# Patient Record
Sex: Female | Born: 1950 | Race: White | Hispanic: No | Smoking: Former smoker
Health system: Southern US, Community
[De-identification: ages and names within clinical notes are randomized; demographics above are authoritative.]

## PROBLEM LIST (undated history)

## (undated) DIAGNOSIS — Z860100 Personal history of colon polyps, unspecified: Secondary | ICD-10-CM

## (undated) DIAGNOSIS — J189 Pneumonia, unspecified organism: Secondary | ICD-10-CM

## (undated) DIAGNOSIS — I5189 Other ill-defined heart diseases: Secondary | ICD-10-CM

## (undated) DIAGNOSIS — E119 Type 2 diabetes mellitus without complications: Secondary | ICD-10-CM

## (undated) DIAGNOSIS — Z8619 Personal history of other infectious and parasitic diseases: Secondary | ICD-10-CM

## (undated) DIAGNOSIS — G4730 Sleep apnea, unspecified: Secondary | ICD-10-CM

## (undated) DIAGNOSIS — I480 Paroxysmal atrial fibrillation: Secondary | ICD-10-CM

## (undated) DIAGNOSIS — K760 Fatty (change of) liver, not elsewhere classified: Secondary | ICD-10-CM

## (undated) DIAGNOSIS — Z8719 Personal history of other diseases of the digestive system: Secondary | ICD-10-CM

## (undated) DIAGNOSIS — M1990 Unspecified osteoarthritis, unspecified site: Secondary | ICD-10-CM

## (undated) DIAGNOSIS — K5790 Diverticulosis of intestine, part unspecified, without perforation or abscess without bleeding: Secondary | ICD-10-CM

## (undated) DIAGNOSIS — K589 Irritable bowel syndrome without diarrhea: Secondary | ICD-10-CM

## (undated) DIAGNOSIS — J45909 Unspecified asthma, uncomplicated: Secondary | ICD-10-CM

## (undated) DIAGNOSIS — E782 Mixed hyperlipidemia: Secondary | ICD-10-CM

## (undated) DIAGNOSIS — C801 Malignant (primary) neoplasm, unspecified: Secondary | ICD-10-CM

## (undated) DIAGNOSIS — I2510 Atherosclerotic heart disease of native coronary artery without angina pectoris: Secondary | ICD-10-CM

## (undated) DIAGNOSIS — E039 Hypothyroidism, unspecified: Secondary | ICD-10-CM

## (undated) DIAGNOSIS — K219 Gastro-esophageal reflux disease without esophagitis: Secondary | ICD-10-CM

## (undated) DIAGNOSIS — F32A Depression, unspecified: Secondary | ICD-10-CM

## (undated) DIAGNOSIS — J449 Chronic obstructive pulmonary disease, unspecified: Secondary | ICD-10-CM

## (undated) DIAGNOSIS — M5126 Other intervertebral disc displacement, lumbar region: Secondary | ICD-10-CM

## (undated) DIAGNOSIS — I10 Essential (primary) hypertension: Secondary | ICD-10-CM

## (undated) DIAGNOSIS — I447 Left bundle-branch block, unspecified: Secondary | ICD-10-CM

## (undated) HISTORY — DX: Gastro-esophageal reflux disease without esophagitis: K21.9

## (undated) HISTORY — DX: Fatty (change of) liver, not elsewhere classified: K76.0

## (undated) HISTORY — DX: Diverticulosis of intestine, part unspecified, without perforation or abscess without bleeding: K57.90

## (undated) HISTORY — DX: Other intervertebral disc displacement, lumbar region: M51.26

## (undated) HISTORY — DX: Unspecified osteoarthritis, unspecified site: M19.90

## (undated) HISTORY — DX: Mixed hyperlipidemia: E78.2

## (undated) HISTORY — DX: Personal history of colon polyps, unspecified: Z86.0100

## (undated) HISTORY — DX: Unspecified asthma, uncomplicated: J45.909

## (undated) HISTORY — DX: Personal history of other diseases of the digestive system: Z87.19

## (undated) HISTORY — DX: Essential (primary) hypertension: I10

## (undated) HISTORY — DX: Personal history of other infectious and parasitic diseases: Z86.19

## (undated) HISTORY — DX: Personal history of colonic polyps: Z86.010

## (undated) HISTORY — DX: Irritable bowel syndrome, unspecified: K58.9

## (undated) HISTORY — DX: Other ill-defined heart diseases: I51.89

## (undated) HISTORY — PX: CARDIAC CATHETERIZATION: SHX172

## (undated) HISTORY — DX: Hypothyroidism, unspecified: E03.9

## (undated) HISTORY — PX: CARPAL TUNNEL RELEASE: SHX101

## (undated) HISTORY — DX: Chronic obstructive pulmonary disease, unspecified: J44.9

## (undated) NOTE — *Deleted (*Deleted)
10/20/2019 I saw and evaluated the patient. Discussed with resident and agree with resident's findings and plan as documented in the resident's note.  I have seen and evaluated the patient for respiratory failure.  S:  38 year old woman with extensive smoking history presenting for definitive management of her esophageal cancer.  She is s/p neoadjuvant chemo completed July of this year.  Underwent robot-assisted Mckeown esophagectomy and pyloroplasty on 10-30-19.  No evidence of anastamotic leak postop.  Having issues with postop volume overload and possibly aspiration pneumonitis that required transfer to ICU today for respiratory distress.   She currently feels better from respiratory standpoint with BIPAP, denies pain.   O: Blood pressure 114/62, pulse (!) 126, temperature 98.8 F (37.1 C), temperature source Axillary, resp. rate (!) 21, height 5\' 2"  (1.575 m), weight 76.9 kg, SpO2 100 %.  Ill appearing woman mildly tachypneic on BIPAP Lungs with crackles on L Ext with muscle wasting Minute ventilation on NIPPV is around 15LPM Incision site on R neck dressed without strikethrough  A:  Acute hypoxemic respiratory failure post op, some combination of fluid overload and possibly aspiration pneumonia.  Looks okay for now on BIPAP but high risk for decompensation  Esophageal cancer post esophagectomy  Muscular deconditioning  P:  - Close observation on BIPAP, intubate if increased WOB or AMS - Trial of lasix gtt, zosyn - Will follow closely with you  The patient is critically ill with multiple organ systems failure and requires high complexity decision making for assessment and support, frequent evaluation and titration of therapies, application of advanced monitoring technologies and extensive interpretation of multiple databases. Critical Care Time devoted to patient care services described in this note independent of APP/resident  time is *** minutes.   10/20/2019 Myrla Halsted MD

## (undated) NOTE — *Deleted (*Deleted)
Physician Discharge Summary  Patient ID: [name] MRN: [id number] DOB/AGE: December 18, 1950 32 y.o.  Admit date: 11/02/2019 Discharge date: 10/21/2019  Admission Diagnoses:  Esophageal cancer Dyslipidemia  History of irritable bowel syndrome History of colon polyps Gastroesophageal reflux disease History of diverticulosis Chronic obstructive pulmonary disease History of asthma Type 2 diabetes mellitus History of atrial fibrillation History of acute kidney injury   Discharge Diagnoses:   Esophageal adenocarcinoma (HCC) S/P esophagectomy Dyslipidemia  History of irritable bowel syndrome History of colon polyps Gastroesophageal reflux disease History of diverticulosis Chronic obstructive pulmonary disease History of asthma Type 2 diabetes mellitus History of atrial fibrillation History of acute kidney injury Pressure injury of skin Acute hypoxemic respiratory failure (HCC)   Discharged Condition: {condition:18240}   History of Present Illness: [name] y.o.female59 year old female that presents for surgical evaluation of a 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 She subsequently underwent neoadjuvant chemoradiation which per her report was completed in July of this year.Over the course of her neoadjuvant therapy she lost approximately 24 pounds. She currently has a PEG tube in place and has needed to resume tube feeds due to odynophagia and dysphagia. She describes a burning sensation with all meals. She also has regurgitation of food as well.  Hospital Course:  [name] was admitted for elective same-day surgery on 10/18/2019.  She was prepared and taken to the operating room where bronchoscopy was performed followed by robotic assisted laparoscopy, robotic assisted thoracoscopy, McKeown esophagectomy, pyloroplasty, mini laparotomy for placement of a jejunostomy feeding tube, and intercostal nerve  block were performed.  Please see the operative note for details.  Following the procedure, the patient was recovered in the postanesthesia care unit and later transferred to [facility]  progressive care.  She was initially kept n.p.o. with an NG tube in place as well as a right chest tube and left neck Penrose drain.  On the first postoperative day, a simple contrast study was performed through the jejunostomy tube under fluoroscopy to assure patency and free flow of contrast distally into the jejunum.  The study showed appropriate function of the feeding tube so on the following day, tube feeding was initiated at a low rate and gradually increase to goal of 55 mL/h.  She tolerated this well.  She remained n.p.o. with the NG tube in for the next few days.  On postop day 3, the NG was removed.  Tube feeding was continued.  The patient was mobilized.  She had some respiratory distress early postoperatively that was felt to be related to oversedation from a PCA in conjunction with moderate volume excess.  The PCA was discontinued and she was diuresed with good response.  This resulted in significant improvement in both her mental status and respiratory status.  On postop day 4, an esophagram was performed that showed acceptable swallowing function with no evidence of anastomotic leak.  Patient was started on clear liquids by mouth which she tolerated well.  The chest tube was left in place for another 24 hours.  There was minimal output.  After consultation with the dietitian, we began gradual transition of the tube feeding from continuous 24-hour infusion to nocturnal feeding.  She had mild sinus tachycardia which was initially treated with the addition of metoprolol suspension by way of the G-tube.  On postop day 6, she again developed significant respiratory distress and worsening tachycardia.  Chest x-ray showed a new area of airspace disease  in the left upper lobe that was very suspicious for aspiration pneumonia.  She  was started on broad-spectrum antibiotic coverage.  Will place patient on 9% nonrebreather and she had improvement in her oxygen saturation but she continued to be tachypneic with a respiratory rate in the 40s and tachycardiac with a heart rate in the 130s.  We placed the patient on BiPAP and diurese her more aggressively with Lasix drip.  She was transferred back to the intensive care unit.  These maneuvers improve her respiratory status substantially and she appeared much more comfortable.  By the following morning, however, chest x-ray was showing worsening left upper lobe infiltrate.  The patient continued to desaturate very easily when off of BiPAP.  She was becoming more anxious.  A decision was made to proceed to intubation and mechanical ventilation. Additionally, arterial line was placed for monitoring purposes as well as a PICC line to optimize vascular access. Supportive care continued.   Consults: pulmonary/intensive care  Significant Diagnostic Studies:   Diagnostic Studies & Laboratory data:  CT Scan:CT scan from July 2021. There is marked thickening of the mid to distal esophagus involving 9 cm. There is interval development of groundglass opacities in the right upper lobe and superior segments of the right lower lobe. She has a stable 3 mm left upper lobe nodule  PET/CT:Scan from May 18, 2019 was reviewed. There is increased metabolic activity in the middle third of the esophagus with an SUV of 15.3. The esophageal thickening begins at the level of the carina extends inferiorly over 5 cm. Hypermetabolism terminates at the GE junction. There is no hypermetabolic paraesophageal lymph nodes or mediastinal nodes. There is a surgical anastomosis and the left abdomen which likely corresponds with her previous bowel resection in 2014.  EGD/EUS:Performed on 05/09/2019. There is an elongated mid esophageal mass from 28 to 33 cm.  Path:Squamous cell cancer of the esophagus.    Treatments:   Operative Note   10/10/2019  Patient:  [name] Pre-Op Dx: Stage II mid esophageal squamous cancer Post-op Dx: Same Procedure: - Bronchoscopy  - Robotic assisted laparoscopy - Robotic assisted thoracoscopy - Mckeown esophagectomy - Pyloroplasty - Open jejunostomy tube placement 11F - Intercostal nerve block -   Surgeon and Role:      * Lightfoot, Eliezer Lofts, MD - Primary    * Dr. 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 She subsequently underwent neoadjuvant chemoradiation which per her report was completed in July of this year.Over the course of her neoadjuvant therapy she lost approximately 24 pounds. She currently has a PEG tube in place and has needed to resume tube feeds due to odynophagia and dysphagia. She describes a burning sensation with all meals. She also has regurgitation of food as well. Findings: On bronchoscopy, the carina, left and right mainstem bronchus appeared normal.  There was no obvious tumor invasion.  Were able to mobilize the esophagus.  It was very adherent to the posterior surface of the trachea around level 7.  Great care was taken and we were able to fully mobilize the esophagus up to the thoracic inlet.    During laparoscopy there were some adhesions that were encountered  upon entry.  Another 5 mm port was placed in the left upper quadrant we were then able to lyse all the abdominal adhesions so that we could place a robotic ports.  We created the gastric conduit without much difficulty.    When we went back down to place the jejunostomy tube the small bowel was matted.  We created a small supraumbilical  incision to access the small bowel safely.  We were not able to pass the jejunostomy tube very far distally due to adhesions down of the pelvis, thus trimmed the jejunostomy tube so that it would fit appropriately.  Discharge Exam: Blood pressure (!) 108/46, pulse (!) 115, temperature 98.6 F (37 C), temperature source Oral, resp. rate (!) 28, height 5\' 2"  (1.575 m), weight 73.1 kg, SpO2 100 %. {physical WUJW:1191478}  Disposition:    Allergies as of 10/21/2019      Reactions   Honey Bee Venom Protein [bee Venom] Anaphylaxis   Ether Nausea And Vomiting   Nickel Other (See Comments)   infection   Other Other (See Comments)   Pt reports that she cannot have staples placed due to infection.   Tape Rash   Patient states that she has an allergic reaction to certain tapes. She states that she can tolerate paper tape.    Med Rec must be completed prior to using this [facility]***        Durable Medical Equipment  (From admission, onward)         Start     Ordered   10/19/19 1602  For home use only DME Bedside commode  Once       Question:  Patient needs a bedside commode to treat with the following condition  Answer:  Weakness   10/19/19 1601   10/19/19 0916  For home use only DME Tube feeding pump  Once       Question:  Length of Need  Answer:  6 Months   10/19/19 0915           Signed: Leary Roca, PA-C 10/21/2019, 2:01 PM

## (undated) NOTE — *Deleted (*Deleted)
Nutrition Follow-up  DOCUMENTATION CODES:   Severe malnutrition in context of chronic illness  INTERVENTION:   Tube Feeding:    NUTRITION DIAGNOSIS:   Severe Malnutrition related to chronic illness (COPD, esophageal cancer s/p chemoradiation) as evidenced by severe fat depletion, severe muscle depletion.  ***  GOAL:   Patient will meet greater than or equal to 90% of their needs  ***  MONITOR:   Vent status, Labs, Weight trends, TF tolerance, Skin, I & O's, Other (Comment) (TPN)  REASON FOR ASSESSMENT:   Consult Enteral/tube feeding initiation and management  ASSESSMENT:   Patient with PMH significant for COPD, diverticulosis, GERD, SBO, HTN, IBS, mixed HLD, and esophageal squamous cell cancer s/p neoadjuvant chemoradiation therapy s/p PEG. Presents this admission for surgical management of esophageal cancer.  ***   NUTRITION - FOCUSED PHYSICAL EXAM:  {RD Focused Exam List:21252}  Diet Order:   Diet Order    None      EDUCATION NEEDS:   Education needs have been addressed  Skin:  Skin Assessment: Skin Integrity Issues: Skin Integrity Issues:: Incisions, Stage II Stage II: bilateral buttocks Incisions: chest x 2, abdomen x 2, neck  Last BM:  11/1 rectal tube  Height:   Ht Readings from Last 1 Encounters:  10/20/19 5\' 2"  (1.575 m)    Weight:   Wt Readings from Last 1 Encounters:  11/08/19 66.3 kg    Ideal Body Weight:     BMI:  Body mass index is 26.73 kg/m.  Estimated Nutritional Needs:   Kcal:  1800-2000  Protein:  100-120 gram  Fluid:  >/= 1.8 L    ***

---

## 2000-11-02 ENCOUNTER — Emergency Department (HOSPITAL_COMMUNITY): Admission: EM | Admit: 2000-11-02 | Discharge: 2000-11-02 | Payer: Self-pay | Admitting: Emergency Medicine

## 2003-10-18 ENCOUNTER — Inpatient Hospital Stay (HOSPITAL_BASED_OUTPATIENT_CLINIC_OR_DEPARTMENT_OTHER): Admission: RE | Admit: 2003-10-18 | Discharge: 2003-10-18 | Payer: Self-pay | Admitting: Cardiology

## 2004-10-27 ENCOUNTER — Emergency Department (HOSPITAL_COMMUNITY): Admission: EM | Admit: 2004-10-27 | Discharge: 2004-10-27 | Payer: Self-pay | Admitting: Emergency Medicine

## 2005-01-05 ENCOUNTER — Emergency Department (HOSPITAL_COMMUNITY): Admission: EM | Admit: 2005-01-05 | Discharge: 2005-01-06 | Payer: Self-pay | Admitting: Emergency Medicine

## 2005-10-27 ENCOUNTER — Emergency Department (HOSPITAL_COMMUNITY): Admission: EM | Admit: 2005-10-27 | Discharge: 2005-10-27 | Payer: Self-pay | Admitting: Emergency Medicine

## 2006-11-04 ENCOUNTER — Emergency Department (HOSPITAL_COMMUNITY): Admission: EM | Admit: 2006-11-04 | Discharge: 2006-11-04 | Payer: Self-pay | Admitting: Emergency Medicine

## 2007-01-19 ENCOUNTER — Emergency Department (HOSPITAL_COMMUNITY): Admission: EM | Admit: 2007-01-19 | Discharge: 2007-01-20 | Payer: Self-pay | Admitting: Emergency Medicine

## 2007-01-19 ENCOUNTER — Ambulatory Visit: Payer: Self-pay | Admitting: Cardiology

## 2007-05-25 ENCOUNTER — Emergency Department (HOSPITAL_COMMUNITY): Admission: EM | Admit: 2007-05-25 | Discharge: 2007-05-25 | Payer: Self-pay | Admitting: Emergency Medicine

## 2008-09-20 ENCOUNTER — Emergency Department (HOSPITAL_COMMUNITY): Admission: EM | Admit: 2008-09-20 | Discharge: 2008-09-20 | Payer: Self-pay | Admitting: Emergency Medicine

## 2010-04-12 LAB — DIFFERENTIAL
Eosinophils Relative: 4 % (ref 0–5)
Lymphocytes Relative: 37 % (ref 12–46)
Lymphs Abs: 2.2 10*3/uL (ref 0.7–4.0)
Monocytes Absolute: 0.5 10*3/uL (ref 0.1–1.0)
Monocytes Relative: 9 % (ref 3–12)

## 2010-04-12 LAB — POCT I-STAT, CHEM 8
BUN: 9 mg/dL (ref 6–23)
Chloride: 105 mEq/L (ref 96–112)
Potassium: 3.7 mEq/L (ref 3.5–5.1)
Sodium: 141 mEq/L (ref 135–145)
TCO2: 27 mmol/L (ref 0–100)

## 2010-04-12 LAB — CBC
HCT: 41.8 % (ref 36.0–46.0)
Hemoglobin: 14.8 g/dL (ref 12.0–15.0)
MCV: 91.1 fL (ref 78.0–100.0)
WBC: 5.9 10*3/uL (ref 4.0–10.5)

## 2010-04-12 LAB — URINALYSIS, ROUTINE W REFLEX MICROSCOPIC
Bilirubin Urine: NEGATIVE
Glucose, UA: NEGATIVE mg/dL
Ketones, ur: NEGATIVE mg/dL
Leukocytes, UA: NEGATIVE
pH: 5.5 (ref 5.0–8.0)

## 2010-04-12 LAB — URINE MICROSCOPIC-ADD ON

## 2010-05-21 NOTE — Discharge Summary (Signed)
**Note Rachel via Obfuscation** [name], Flores                ACCOUNT NO.:  [id number]   MEDICAL RECORD NO.:  [id number]          PATIENT TYPE:  EMS   LOCATION:  MAJO                         FACILITY:  [facility]   PHYSICIAN:  Madolyn Frieze. Jens Som, MD, FACCDATE OF BIRTH:  22-Jul-1950   DATE OF ADMISSION:  01/19/2007  DATE OF DISCHARGE:  01/20/2007                               DISCHARGE SUMMARY   PROCEDURE:  None.   FINAL DISCHARGE DIAGNOSIS:  Chest pain.   SECONDARY DIAGNOSES:  1. Hypertension.  2. Ongoing tobacco use.  3. Gastroesophageal reflux disease.  4. Status post cardiac catheterization October 2005 with an ejection      fraction of 55%, left main 20%, left anterior descending 30-40%,      circumflex and right coronary artery normal.  5. Hypothyroidism.  6. History of hiatal hernia.  7. Hyperlipidemia.  8. Status post appendectomy.  9. Family history of coronary artery disease in her father.   Time of discharge 31 minutes.   HOSPITAL COURSE:  Ms. [name] is a 60 year old female with a history of  nonobstructive coronary artery disease by cath in 2005.  She developed  chest pain at rest on the day of admission which lasted 2-3 hours and  resolved with Tylenol.  She states she gets these symptoms about once a  month and there are no associated symptoms.  She was pain free by  arrival in the emergency room and was admitted for further evaluation.   LABORATORY DATA:  Her cardiac enzymes were negative for MI.  A lipid  profile showed dyslipidemia with total cholesterol 96, triglycerides  159, HDL 28, LDL 36.  Other labs were within normal limits except for a  minimally elevated nonfasting blood sugar at 113.   With her symptoms resolved, EKG without significant abnormalities and  cardiac enzymes negative for MI, Dr. Jens Som felt she could be  discharged safely home and follow up as an outpatient.   DISCHARGE INSTRUCTIONS:  1. Her activity level is to be as tolerated.  2. She is encouraged to stick to  a heart-healthy diet.  3. She is to follow up with Dr. Gala Romney on February 03, 2007 at 8      a.m.  4. She is encouraged to discontinue tobacco use.   DISCHARGE MEDICATIONS:  1. Nexium 40 mg daily.  2. Crestor 5 mg daily.  3. Levothyroxine 50 mcg daily.  4. Lisinopril 20 mg daily.      Theodore Demark, PA-C      Madolyn Frieze. Jens Som, MD, [facility]  Electronically Signed    RB/MEDQ  D:  01/20/2007  T:  01/20/2007  Job:  [id number]

## 2010-05-21 NOTE — H&P (Signed)
[name], [name]                ACCOUNT NO.:  [id number]   MEDICAL RECORD NO.:  [id number]          PATIENT TYPE:  INP   LOCATION:  1824                         FACILITY:  [facility]   PHYSICIAN:  Rachel Jefferson, MD          DATE OF BIRTH:  Aug 19, 1950   DATE OF ADMISSION:  01/19/2007  DATE OF DISCHARGE:                              HISTORY & PHYSICAL   CHIEF COMPLAINT:  Chest pain.   Rachel CARE PHYSICIAN:  [name], M.D.   HISTORY OF PRESENTING ILLNESS:  The patient is a 60 year old white  female with a history of hypertension, tobacco, nonobstructive coronary  artery disease, and GERD who comes in with complaint of chest pain.  She  reports that this chest pain began earlier today while sitting on the  couch.  It lasted about 2-3 hours, resolved after she took 2 Tylenol.  She reports she gets this pain about once a month.  It is substernal and  not associated with any symptoms.  Not associated with any exertional  activity.  She thinks it is sometimes related to her hiatal hernia which  gives her problems, as well.  She was most recently hospitalized with a  similar episode of this chest pain in October 2008, with a negative  nuclear stress and was told it was related to her reflux disease.  She  had similar symptoms back in 2005, for which she received a left heart  catheterization with a mild 20% mid LAD lesion, no other obstructive  coronary artery lesions.  Currently, she is chest pain free.   PAST MEDICAL HISTORY:  1. Nonobstructive coronary artery disease by last cath in 2005.  Most      recent negative nuclear stress test per the patient in October      2008.  2. Hypertension.  3. Tobacco abuse.  4. Hypothyroidism.   SOCIAL HISTORY:  She is married.  She has a 60 pack/year history of  smoking.  Continues to smoke.  Alcohol and drug use is negative.   FAMILY HISTORY:  Father with an MI at age 47; but, otherwise, no  significant coronary  artery disease in her family.   MEDICATIONS:  1. Nexium 40 mg a day.  2. Crestor 5 mg a day.  3. Levothyroxin 50 mg daily.  4. Lisinopril 20 mg p.o. daily.   Does not take an aspirin.   ALLERGIES:  No known drug allergies.   REVIEW OF SYSTEMS:  Negative 14-point review of systems except for  otherwise dictated in the above HPI.   PHYSICAL EXAMINATION:  VITAL SIGNS:  Blood pressure 133/74, heart rate  61.  T-max:  She is afebrile.  GENERAL:  Well-developed, well-nourished white female in no acute  distress.  HEENT:  Very poor dentition but otherwise moist mucous membranes.  No  scleral icterus or conjunctival pallor.  NECK:  Supple.  Full range of motion.  No jugular venous distention.  CARDIOVASCULAR:  Regular rate and rhythm.  No rubs, murmurs, or gallops.  CHEST:  Clear to auscultation bilaterally.  No wheezing,  rales, or  rhonchi.  ABDOMEN:  Soft, nontender, nondistended.  Normoactive bowel sounds.  EXTREMITIES:  No peripheral edema.  Pulses 2+ bilaterally.   MEDICAL DECISION MAKING:  Chest x-ray, personally reviewed by me,  demonstrates no acute infiltrative process.  EKG, personally reviewed by  me, demonstrates normal sinus rhythm with left bundle branch block which  is old for her.   LABORATORY DATA:  White count 7.4, hemoglobin 13.6, platelets 176.  BUN  and creatinine 21 and 1.1.  Glucose 113.  Negative cardiac biomarkers.   IMPRESSION:  1. Chest pain (unstable angina versus gastroesophageal reflux      disease).  2. Hypertension.  3. Tobacco abuse.   PLAN:  Will admit the patient to telemetry monitoring per [name].  Rule out for myocardial infarction by serial enzymes.  Aspirin, statin,  and I will hold beta blocker given her low resting heart rate.  No  heparin at this time as her TIMI score is 2 and do not feel that these  symptoms are cardiovascular in origin, especially with her negative  objective cardiovascular tests in the past.  Will let Dr. Nicholes Flores  evaluate her on terms of what he wants to do for her risk  stratification.      Rachel Jefferson, MD  Electronically Signed     JT/MEDQ  D:  01/20/2007  T:  01/20/2007  Job:  [id number]

## 2010-05-24 NOTE — Cardiovascular Report (Signed)
[name], [name]                ACCOUNT NO.:  [id number]   MEDICAL RECORD NO.:  [id number]          PATIENT TYPE:  OIB   LOCATION:  6501                         FACILITY:  [facility]   PHYSICIAN:  Carole Binning, M.D. LHCDATE OF BIRTH:  15-Jan-1950   DATE OF PROCEDURE:  10/18/2003  DATE OF DISCHARGE:                              CARDIAC CATHETERIZATION   PROCEDURE PERFORMED:  Left heart catheterization with coronary angiography  and left ventriculography.   INDICATIONS FOR PROCEDURE:  Ms. Ulyses Amor is a 60 year old woman who presented  with symptoms of recurrent substernal chest pain. Baseline EKG showed a left  bundle branch block. Because of her chest pain and abnormal EKG, she was  referred for cardiac catheterization to rule out coronary artery disease.   PROCEDURE:  A 4-French sheath was placed in the right femoral artery.  Coronary angiography was performed using a standard Judkins 4-French  catheter. Left ventriculography was performed with an angled pigtail  catheter. Contrast was Omnipaque. There were no complications.   RESULTS:  Hemodynamics, left ventricular pressure 134/4; aortic pressure  120/56. There was no aortic valve gradient on catheter pull back.   LEFT VENTRICULOGRAM.:  There is possibly mild diffuse hypokinesis of the  left ventricle without regional wall motion abnormality. The ejection  fraction is estimated at 55%. There is no mitral regurgitation.   CORONARY ARTERIOGRAPHY (RIGHT DOMINANT):  The left main has a distal 20%  stenosis.   The left anterior descending artery has a 30% to 40% stenosis in the  proximal vessel at the bifurcation of a normal size first diagonal branch.  The LAD gives rise to a normal size first diagonal branch and a small second  diagonal branch.   The left circumflex gives rise to a very small first obtuse marginal and a  large second obtuse marginal branch. The left circumflex is normal.   The right coronary artery gives rise  to a normal size posterior descending  artery and a normal size posterolateral branch. The right coronary artery is  normal.   IMPRESSION:  1.  Left ventricular systolic function in the lower range of normal.  2.  Mild but nonsignificant coronary artery disease.   RECOMMENDATIONS:  For medical therapy.       MWP/MEDQ  D:  10/18/2003  T:  10/18/2003  Job:  [id number]   cc:   Rayburn Ma  [address]  [place]  [place] [address]  Fax: [phone]

## 2010-09-26 LAB — I-STAT 8, (EC8 V) (CONVERTED LAB)
BUN: 21
Glucose, Bld: 113 — ABNORMAL HIGH
Hemoglobin: 13.6
Potassium: 3.8
Sodium: 141

## 2010-09-26 LAB — BASIC METABOLIC PANEL
BUN: 21
Chloride: 108
GFR calc Af Amer: >60
GFR calc non Af Amer: 52 — ABNORMAL LOW
Potassium: 4.1
Sodium: 140

## 2010-09-26 LAB — CBC
HCT: 36.8
MCV: 89.3
MCV: 90
Platelets: 156
RBC: 4.12
RBC: 4.38
WBC: 5.8
WBC: 7.4

## 2010-09-26 LAB — DIFFERENTIAL
Basophils Relative: 1
Eosinophils Absolute: 0.2
Eosinophils Absolute: 0.2
Lymphs Abs: 2.4
Lymphs Abs: 2.9
Monocytes Relative: 8
Neutro Abs: 2.6
Neutro Abs: 3.7
Neutrophils Relative %: 46
Neutrophils Relative %: 50

## 2010-09-26 LAB — PHOSPHORUS: Phosphorus: 4.5

## 2010-09-26 LAB — POCT I-STAT CREATININE
Creatinine, Ser: 1.1
Operator id: 196461

## 2010-09-26 LAB — MAGNESIUM: Magnesium: 1.8

## 2010-09-26 LAB — POCT CARDIAC MARKERS
CKMB, poc: 1.7
CKMB, poc: <1 — ABNORMAL LOW
Myoglobin, poc: 66.4
Myoglobin, poc: 80.6
Operator id: 196461
Operator id: 272551
Troponin i, poc: <0.05

## 2010-09-26 LAB — TROPONIN I
Troponin I: 0.01
Troponin I: 0.01

## 2010-09-26 LAB — LIPID PANEL
LDL Cholesterol: 36
Total CHOL/HDL Ratio: 3.4
VLDL: 32

## 2010-09-26 LAB — CK TOTAL AND CKMB (NOT AT ARMC)
CK, MB: 1.1
Total CK: 85

## 2010-10-02 LAB — CBC
HCT: 38.9
Hemoglobin: 13.9
RBC: 4.35

## 2010-10-02 LAB — POCT I-STAT, CHEM 8
BUN: 21
Calcium, Ion: 1.07 — ABNORMAL LOW
Chloride: 108
Creatinine, Ser: 1.2
Glucose, Bld: 124 — ABNORMAL HIGH

## 2010-10-02 LAB — DIFFERENTIAL
Lymphocytes Relative: 33
Lymphs Abs: 2.9
Monocytes Absolute: 0.5
Monocytes Relative: 6
Neutro Abs: 5.1

## 2010-10-02 LAB — URINALYSIS, ROUTINE W REFLEX MICROSCOPIC
Bilirubin Urine: NEGATIVE
Glucose, UA: NEGATIVE
Hgb urine dipstick: NEGATIVE
Ketones, ur: NEGATIVE
pH: 5.5

## 2010-10-02 LAB — URINE MICROSCOPIC-ADD ON

## 2010-10-16 LAB — URINALYSIS, ROUTINE W REFLEX MICROSCOPIC
Leukocytes, UA: NEGATIVE
Nitrite: NEGATIVE
Specific Gravity, Urine: 1.014
pH: 6

## 2010-10-16 LAB — CBC
HCT: 37.3
Platelets: 180
WBC: 6

## 2010-10-16 LAB — COMPREHENSIVE METABOLIC PANEL
AST: 16
Albumin: 4
Alkaline Phosphatase: 46
BUN: 15
Chloride: 109
GFR calc Af Amer: >60
Potassium: 3.8
Total Bilirubin: 0.3

## 2010-10-16 LAB — URINE CULTURE: Colony Count: NO GROWTH

## 2010-10-16 LAB — DIFFERENTIAL
Basophils Absolute: 0.1
Basophils Relative: 2 — ABNORMAL HIGH
Eosinophils Absolute: 0.2
Eosinophils Relative: 3
Monocytes Absolute: 0.4

## 2010-10-16 LAB — POCT CARDIAC MARKERS: Troponin i, poc: <0.05

## 2010-10-16 LAB — URINE MICROSCOPIC-ADD ON

## 2012-03-02 ENCOUNTER — Other Ambulatory Visit: Payer: Self-pay | Admitting: Family Medicine

## 2012-03-04 ENCOUNTER — Other Ambulatory Visit: Payer: Self-pay | Admitting: Family Medicine

## 2018-06-07 DIAGNOSIS — R109 Unspecified abdominal pain: Secondary | ICD-10-CM

## 2018-06-07 DIAGNOSIS — E039 Hypothyroidism, unspecified: Secondary | ICD-10-CM

## 2018-06-07 DIAGNOSIS — E785 Hyperlipidemia, unspecified: Secondary | ICD-10-CM

## 2018-06-07 DIAGNOSIS — I119 Hypertensive heart disease without heart failure: Secondary | ICD-10-CM | POA: Diagnosis not present

## 2018-06-07 DIAGNOSIS — R079 Chest pain, unspecified: Secondary | ICD-10-CM | POA: Diagnosis not present

## 2018-10-13 ENCOUNTER — Observation Stay
Admission: EM | Admit: 2018-10-13 | Payer: Medicare Other | Source: Other Acute Inpatient Hospital | Admitting: Cardiology

## 2018-10-14 ENCOUNTER — Telehealth: Payer: Self-pay

## 2018-10-14 DIAGNOSIS — R079 Chest pain, unspecified: Secondary | ICD-10-CM | POA: Diagnosis not present

## 2018-10-14 DIAGNOSIS — I447 Left bundle-branch block, unspecified: Secondary | ICD-10-CM

## 2018-10-14 DIAGNOSIS — E785 Hyperlipidemia, unspecified: Secondary | ICD-10-CM | POA: Diagnosis not present

## 2018-10-14 DIAGNOSIS — E1149 Type 2 diabetes mellitus with other diabetic neurological complication: Secondary | ICD-10-CM | POA: Diagnosis not present

## 2018-10-14 NOTE — Telephone Encounter (Signed)
Unable to contact patient to complete TOC number not in service, pt has an up coming appt at [facility] on the 16th

## 2018-10-19 ENCOUNTER — Other Ambulatory Visit: Payer: Self-pay | Admitting: *Deleted

## 2018-10-19 ENCOUNTER — Encounter: Payer: Self-pay | Admitting: *Deleted

## 2018-10-19 ENCOUNTER — Other Ambulatory Visit: Payer: Self-pay

## 2018-10-19 ENCOUNTER — Ambulatory Visit (INDEPENDENT_AMBULATORY_CARE_PROVIDER_SITE_OTHER): Payer: Medicare Other | Admitting: Cardiology

## 2018-10-19 ENCOUNTER — Encounter: Payer: Self-pay | Admitting: Cardiology

## 2018-10-19 VITALS — BP 120/68 | HR 82 | Ht 64.0 in | Wt 183.0 lb

## 2018-10-19 DIAGNOSIS — I209 Angina pectoris, unspecified: Secondary | ICD-10-CM | POA: Diagnosis not present

## 2018-10-19 DIAGNOSIS — E119 Type 2 diabetes mellitus without complications: Secondary | ICD-10-CM | POA: Diagnosis not present

## 2018-10-19 DIAGNOSIS — M1990 Unspecified osteoarthritis, unspecified site: Secondary | ICD-10-CM | POA: Insufficient documentation

## 2018-10-19 DIAGNOSIS — I5189 Other ill-defined heart diseases: Secondary | ICD-10-CM | POA: Insufficient documentation

## 2018-10-19 DIAGNOSIS — Z8619 Personal history of other infectious and parasitic diseases: Secondary | ICD-10-CM | POA: Insufficient documentation

## 2018-10-19 DIAGNOSIS — I10 Essential (primary) hypertension: Secondary | ICD-10-CM

## 2018-10-19 DIAGNOSIS — Z720 Tobacco use: Secondary | ICD-10-CM

## 2018-10-19 DIAGNOSIS — K5790 Diverticulosis of intestine, part unspecified, without perforation or abscess without bleeding: Secondary | ICD-10-CM | POA: Insufficient documentation

## 2018-10-19 DIAGNOSIS — I208 Other forms of angina pectoris: Secondary | ICD-10-CM | POA: Diagnosis not present

## 2018-10-19 DIAGNOSIS — Z8719 Personal history of other diseases of the digestive system: Secondary | ICD-10-CM | POA: Insufficient documentation

## 2018-10-19 DIAGNOSIS — E782 Mixed hyperlipidemia: Secondary | ICD-10-CM

## 2018-10-19 DIAGNOSIS — E039 Hypothyroidism, unspecified: Secondary | ICD-10-CM | POA: Insufficient documentation

## 2018-10-19 DIAGNOSIS — J449 Chronic obstructive pulmonary disease, unspecified: Secondary | ICD-10-CM | POA: Insufficient documentation

## 2018-10-19 DIAGNOSIS — M5126 Other intervertebral disc displacement, lumbar region: Secondary | ICD-10-CM | POA: Insufficient documentation

## 2018-10-19 DIAGNOSIS — K219 Gastro-esophageal reflux disease without esophagitis: Secondary | ICD-10-CM | POA: Insufficient documentation

## 2018-10-19 DIAGNOSIS — Z86010 Personal history of colonic polyps: Secondary | ICD-10-CM | POA: Insufficient documentation

## 2018-10-19 DIAGNOSIS — J45909 Unspecified asthma, uncomplicated: Secondary | ICD-10-CM | POA: Insufficient documentation

## 2018-10-19 DIAGNOSIS — K760 Fatty (change of) liver, not elsewhere classified: Secondary | ICD-10-CM | POA: Insufficient documentation

## 2018-10-19 DIAGNOSIS — K589 Irritable bowel syndrome without diarrhea: Secondary | ICD-10-CM | POA: Insufficient documentation

## 2018-10-19 MED ORDER — METOPROLOL TARTRATE 50 MG PO TABS: 100.0000 mg | ORAL_TABLET | Freq: Once | ORAL | 0 refills | Status: DC

## 2018-10-19 NOTE — Patient Instructions (Addendum)
Medication Instructions:  Your physician recommends that you continue on your current medications as directed. Please refer to the Current Medication list given to you today.  If you need a refill on your cardiac medications before your next appointment, please call your pharmacy.   Lab work: You will need to come in 7 days prior to procedure to have a BMP drawn.  If you have labs (blood work) drawn today and your tests are completely normal, you will receive your results only by:  [place] (if you have MyChart) OR  A paper copy in the mail If you have any lab test that is abnormal or we need to change your treatment, we will call you to review the results.  Testing/Procedures: Non-Cardiac CT scanning, (CAT scanning), is a noninvasive, special x-ray that produces cross-sectional images of the body using x-rays and a computer. CT scans help physicians diagnose and treat medical conditions. For some CT exams, a contrast material is used to enhance visibility in the area of the body being studied. CT scans provide greater clarity and reveal more details than regular x-ray exams.  Please arrive at the [facility] main entrance of [facility] at xx:xx AM (30-45 minutes prior to test start time)  [facility] [address], [place] [address] [phone]  Proceed to the [facility] Radiology Department (First Floor).  Please follow these instructions carefully (unless otherwise directed):  On the Night Before the Test:  Be sure to Drink plenty of water.  Do not consume any caffeinated/decaffeinated beverages or chocolate 12 hours prior to your test.  Do not take any antihistamines 12 hours prior to your test.  On the Day of the Test:  Drink plenty of water. Do not drink any water within one hour of the test.  Do not eat any food 4 hours prior to the test.  You may take your regular medications prior to the test.   Take metoprolol (Lopressor)  two hours prior to test.  HOLD Furosemide/Hydrochlorothiazide morning of the test.      After the Test:  Drink plenty of water.  After receiving IV contrast, you may experience a mild flushed feeling. This is normal.  On occasion, you may experience a mild rash up to 24 hours after the test. This is not dangerous. If this occurs, you can take Benadryl 25 mg and increase your fluid intake.  If you experience trouble breathing, this can be serious. If it is severe call 911 IMMEDIATELY. If it is mild, please call our office.  If you take any of these medications: Glipizide/Metformin, Avandament, Glucavance, please do not take 48 hours after completing test.   Follow-Up: At [facility], you and your health needs are our priority.  As part of our continuing mission to provide you with exceptional heart care, we have created designated Provider Care Teams.  These Care Teams include your primary Cardiologist (physician) and Advanced Practice Providers (APPs -  Physician Assistants and Nurse Practitioners) who all work together to provide you with the care you need, when you need it. You will need a follow up appointment in 3 months.    Any Other Special Instructions Will Be Listed Below  Metoprolol tablets What is this medicine? METOPROLOL (me TOE proe lole) is a beta-blocker. Beta-blockers reduce the workload on the heart and help it to beat more regularly. This medicine is used to treat high blood pressure and to prevent chest pain. It is also used to  after a heart attack and to prevent an additional heart attack from occurring. This medicine may be used for other purposes; ask your health care provider or pharmacist if you have questions. COMMON BRAND NAME(S): Lopressor What should I tell my health care provider before I take this medicine? They need to know if you have any of these conditions: -diabetes -heart or vessel disease like slow heart rate, worsening heart failure, heart block,  sick sinus syndrome or Raynaud's disease -kidney disease -liver disease -lung or breathing disease, like asthma or emphysema -pheochromocytoma -thyroid disease -an unusual or allergic reaction to metoprolol, other beta-blockers, medicines, foods, dyes, or preservatives -pregnant or trying to get pregnant -breast-feeding How should I use this medicine? Take this medicine by mouth with a drink of water. Follow the directions on the prescription label. Take this medicine immediately after meals. Take your doses at regular intervals. Do not take more medicine than directed. Do not stop taking this medicine suddenly. This could lead to serious heart-related effects. Talk to your pediatrician regarding the use of this medicine in children. Special care may be needed. Overdosage: If you think you have taken too much of this medicine contact a poison control center or emergency room at once. NOTE: This medicine is only for you. Do not share this medicine with others. What if I miss a dose? If you miss a dose, take it as soon as you can. If it is almost time for your next dose, take only that dose. Do not take double or extra doses. What may interact with this medicine? This medicine may interact with the following medications: -certain medicines for blood pressure, heart disease, irregular heart beat -certain medicines for depression like monoamine oxidase (MAO) inhibitors, fluoxetine, or paroxetine -clonidine -dobutamine -epinephrine -isoproterenol -reserpine This list may not describe all possible interactions. Give your health care provider a list of all the medicines, herbs, non-prescription drugs, or dietary supplements you use. Also tell them if you smoke, drink alcohol, or use illegal drugs. Some items may interact with your medicine. What should I watch for while using this medicine? Visit your doctor or health care professional for regular check ups. Contact your doctor right away if your  symptoms worsen. Check your blood pressure and pulse rate regularly. Ask your health care professional what your blood pressure and pulse rate should be, and when you should contact them. You may get drowsy or dizzy. Do not drive, use machinery, or do anything that needs mental alertness until you know how this medicine affects you. Do not sit or stand up quickly, especially if you are an older patient. This reduces the risk of dizzy or fainting spells. Contact your doctor if these symptoms continue. Alcohol may interfere with the effect of this medicine. Avoid alcoholic drinks. What side effects may I notice from receiving this medicine? Side effects that you should report to your doctor or health care professional as soon as possible: -allergic reactions like skin rash, itching or hives -cold or numb hands or feet -depression -difficulty breathing -faint -fever with sore throat -irregular heartbeat, chest pain -rapid weight gain -swollen legs or ankles Side effects that usually do not require medical attention (report to your doctor or health care professional if they continue or are bothersome): -anxiety or nervousness -change in sex drive or performance -dry skin -headache -nightmares or trouble sleeping -short term memory loss -stomach upset or diarrhea -unusually tired This list may not describe all possible side effects. Call your doctor for medical  advice about side effects. You may report side effects to FDA at 1-800-FDA-1088. Where should I keep my medicine? Keep out of the reach of children. Store at room temperature between 15 and 30 degrees C (59 and 86 degrees F). Throw away any unused medicine after the expiration date. NOTE: This sheet is a summary. It may not cover all possible information. If you have questions about this medicine, talk to your doctor, pharmacist, or health care provider.  2019 Elsevier/Gold Standard (2012-08-27 14:40:36)   Coronary Angiogram A  coronary angiogram is an X-ray procedure that is used to examine the arteries in the heart. In this procedure, a dye (contrast dye) is injected through a long, thin tube (catheter). The catheter is inserted through the groin, wrist, or arm. The dye is injected into each artery, then X-rays are taken to show if there is a blockage in the arteries of the heart. This procedure can also show if you have valve disease or a disease of the aorta, and it can be used to check the overall function of your heart muscle. You may have a coronary angiogram if:  You are having chest pain, or other symptoms of angina, and you are at risk for heart disease.  You have an abnormal electrocardiogram (ECG) or stress test.  You have chest pain and heart failure.  You are having irregular heart rhythms.  You and your health care provider determine that the benefits of the test information outweigh the risks of the procedure. Let your health care provider know about:  Any allergies you have, including allergies to contrast dye.  All medicines you are taking, including vitamins, herbs, eye drops, creams, and over-the-counter medicines.  Any problems you or family members have had with anesthetic medicines.  Any blood disorders you have.  Any surgeries you have had.  History of kidney problems or kidney failure.  Any medical conditions you have.  Whether you are pregnant or may be pregnant. What are the risks? Generally, this is a safe procedure. However, problems may occur, including:  Infection.  Allergic reaction to medicines or dyes that are used.  Bleeding from the access site or other locations.  Kidney injury, especially in people with impaired kidney function.  Stroke (rare).  Heart attack (rare).  Damage to other structures or organs. What happens before the procedure? Staying hydrated Follow instructions from your health care provider about hydration, which may include:  Up to 2 hours  before the procedure - you may continue to drink clear liquids, such as water, clear fruit juice, black coffee, and plain tea. Eating and drinking restrictions Follow instructions from your health care provider about eating and drinking, which may include:  8 hours before the procedure - stop eating heavy meals or foods such as meat, fried foods, or fatty foods.  6 hours before the procedure - stop eating light meals or foods, such as toast or cereal.  2 hours before the procedure - stop drinking clear liquids. General instructions  Ask your health care provider about: ? Changing or stopping your regular medicines. This is especially important if you are taking diabetes medicines or blood thinners. ? Taking medicines such as ibuprofen. These medicines can thin your blood. Do not take these medicines before your procedure if your health care provider instructs you not to, though aspirin may be recommended prior to coronary angiograms.  Plan to have someone take you home from the hospital or clinic.  You may need to have blood tests  or X-rays done. What happens during the procedure?  An IV tube will be inserted into one of your veins.  You will be given one or more of the following: ? A medicine to help you relax (sedative). ? A medicine to numb the area where the catheter will be inserted into an artery (local anesthetic).  To reduce your risk of infection: ? Your health care team will wash or sanitize their hands. ? Your skin will be washed with soap. ? Hair may be removed from the area where the catheter will be inserted.  You will be connected to a continuous ECG monitor.  The catheter will be inserted into an artery. The location may be in your groin, in your wrist, or in the fold of your arm (near your elbow).  A type of X-ray (fluoroscopy) will be used to help guide the catheter to the opening of the blood vessel that is being examined.  A dye will be injected into the  catheter, and X-rays will be taken. The dye will help to show where any narrowing or blockages are located in the heart arteries.  Tell your health care provider if you have any chest pain or trouble breathing during the procedure.  If blockages are found, your health care provider may perform another procedure, such as inserting a coronary stent. The procedure may vary among health care providers and hospitals. What happens after the procedure?  After the procedure, you will need to keep the area still for a few hours, or for as long as told by your health care provider. If the procedure is done through the groin, you will be instructed to not bend and not cross your legs.  The insertion site will be checked frequently.  The pulse in your foot or wrist will be checked frequently.  You may have additional blood tests, X-rays, and a test that records the electrical activity of your heart (ECG).  Do not drive for 24 hours if you were given a sedative. Summary  A coronary angiogram is an X-ray procedure that is used to look into the arteries in the heart.  During the procedure, a dye (contrast dye) is injected through a long, thin tube (catheter). The catheter is inserted through the groin, wrist, or arm.  Tell your health care provider about any allergies you have, including allergies to contrast dye.  After the procedure, you will need to keep the area still for a few hours, or for as long as told by your health care provider. This information is not intended to replace advice given to you by your health care provider. Make sure you discuss any questions you have with your health care provider. Document Released: 06/29/2002 Document Revised: 10/05/2015 Document Reviewed: 10/05/2015 Elsevier Interactive Patient Education  2019 [employer].

## 2018-10-19 NOTE — Progress Notes (Signed)
Cardiology Office Note:    Date:  10/19/2018   ID:  [name], DOB 1950-02-04, MRN [id number]  PCP:  Martinique, Sarah T, MD  Cardiologist:  No primary care provider on file.  Electrophysiologist:  None   Referring MD: Martinique, Sarah T, MD   Chief Complaint  Patient presents with  . Hospitalization Follow-up    History of Present Illness:    [name] is a 68 y.o. female with a hx of CAD ( based on calcification coronary CT however patient denies any history of PCI's and stents), COPD, diabetes, hypertension presents today after hospital visit.  The patient was recently evaluated at the [facility] after presenting for intermittent substernal chest pain.  Her troponin during that evaluation are negative.  Previous marker perfusion study reported no ischemia and there is soft tissue attenuation was worse likely due to left bundle branch block.   Since her ER visit denies any chest pain, shortness of breath, lightheadedness dizziness.  She also has seen GI and underwent a barium study today which was reported as normal.  Of note the patient reports that in the early 90s she had a left heart catheter stationed in [facility] which was reported as normal.  In for record in 2014 she had a normal coronary arteriography.  There is also record of a myocardial infarction 2005 for which the patient reports a normal left heart cath).   Past Medical History:  Diagnosis Date  . Asthma   . COPD (chronic obstructive pulmonary disease) ([place])   . Diastolic dysfunction   . Diverticulosis   . Fatty liver   . GERD without esophagitis   . Hx of Clostridium difficile infection   . Hx of colonic polyps   . Hx of small bowel obstruction   . Hypertension, benign   . Hypothyroidism   . Irritable bowel syndrome   . Lumbar herniated disc   . Mixed hyperlipidemia   . Osteoarthritis     Past Surgical History:  Procedure Laterality Date  . ABDOMINAL HYSTERECTOMY  2002  . APPENDECTOMY   2005  . CARPAL TUNNEL RELEASE Bilateral   . SMALL INTESTINE SURGERY  11/26/2012   Bowel obstruction, Dr. Pauletta Browns  . TUBAL LIGATION  1980    Current Medications: Current Meds  Medication Sig  . BREO ELLIPTA 100-25 MCG/INH AEPB   . Eluxadoline (VIBERZI) 75 MG TABS Take 75 mg by mouth 2 (two) times daily.  [name] gabapentin (NEURONTIN) 300 MG capsule 300 mg 3 (three) times daily.  [name] levothyroxine (SYNTHROID) 75 MCG tablet 75 mcg daily.  [name] lisinopril-hydrochlorothiazide (ZESTORETIC) 10-12.5 MG tablet daily.  [name] OTEZLA 30 MG TABS 30 mg daily.  . pantoprazole (PROTONIX) 40 MG tablet 40 mg daily.  . pravastatin (PRAVACHOL) 80 MG tablet 80 mg daily.     Allergies:   Ether and Latex   Social History   Socioeconomic History  . Marital status: Legally Separated    Spouse name: Not on file  . Number of children: Not on file  . Years of education: Not on file  . Highest education level: Not on file  Occupational History  . Not on file  Social Needs  . Financial resource strain: Not on file  . Food insecurity    Worry: Not on file    Inability: Not on file  . Transportation needs    Medical: Not on file    Non-medical: Not on file  Tobacco Use  . Smoking status: Former  Smoker    Quit date: 11/2012    Years since quitting: 5.9  . Smokeless tobacco: Never Used  Substance and Sexual Activity  . Alcohol use: Not Currently  . Drug use: Not Currently  . Sexual activity: Not on file  Lifestyle  . Physical activity    Days per week: Not on file    Minutes per session: Not on file  . Stress: Not on file  Relationships  . Social Herbalist on phone: Not on file    Gets together: Not on file    Attends religious service: Not on file    Active member of club or organization: Not on file    Attends meetings of clubs or organizations: Not on file    Relationship status: Not on file  Other Topics Concern  . Not on file  Social History Narrative  . Not on file     Family  History: The patient's family history includes Colon cancer in her daughter; Lung cancer in her brother; Ovarian cancer in her sister; Stomach cancer in her maternal grandmother.  ROS:   Review of Systems  Constitution: Negative for decreased appetite, fever and weight gain.  HENT: Negative for congestion, ear discharge, hoarse voice and sore throat.   Eyes: Negative for discharge, redness, vision loss in right eye and visual halos.  Cardiovascular: Negative for chest pain, dyspnea on exertion, leg swelling, orthopnea and palpitations.  Respiratory: Negative for cough, hemoptysis, shortness of breath and snoring.   Endocrine: Negative for heat intolerance and polyphagia.  Hematologic/Lymphatic: Negative for bleeding problem. Does not bruise/bleed easily.  Skin: Negative for flushing, nail changes, rash and suspicious lesions.  Musculoskeletal: Negative for arthritis, joint pain, muscle cramps, myalgias, neck pain and stiffness.  Gastrointestinal: Negative for abdominal pain, bowel incontinence, diarrhea and excessive appetite.  Genitourinary: Negative for decreased libido, genital sores and incomplete emptying.  Neurological: Negative for brief paralysis, focal weakness, headaches and loss of balance.  Psychiatric/Behavioral: Negative for altered mental status, depression and suicidal ideas.  Allergic/Immunologic: Negative for HIV exposure and persistent infections.    EKGs/Labs/Other Studies Reviewed:    The following studies were reviewed today:   EKG:  The ekg ordered today demonstrates sinus rhythm with a heart rate of 60 bpm with left bundle branch block, unchanged from EKG performed October 13, 2018 which shows sinus rhythm with left bundle branch block.  Chest x-ray on 10/12/2018 reported no acute cardio pulmonary processes.  Recent Labs: No results found for requested labs within last 8760 hours.  Recent Lipid Panel    Component Value Date/Time   CHOL  01/20/2007 0505    96         ATP III CLASSIFICATION:  <200     mg/dL   Desirable  200-239  mg/dL   Borderline High  >=240    mg/dL   High          TRIG 159 (H) 01/20/2007 0505   HDL 28 (L) 01/20/2007 0505   CHOLHDL 3.4 01/20/2007 0505   VLDL 32 01/20/2007 0505   LDLCALC  01/20/2007 0505    36        Total Cholesterol/HDL:CHD Risk Coronary Heart Disease Risk Table                     Men   Women  1/2 Average Risk   3.4   3.3  Average Risk       5.0   4.4  2 X Average Risk   9.6   7.1  3 X Average Risk  23.4   11.0        Use the calculated Patient Ratio above and the CHD Risk Table to determine the patient's CHD Risk.        ATP III CLASSIFICATION (LDL):  <100     mg/dL   Optimal  100-129  mg/dL   Near or Above                    Optimal  130-159  mg/dL   Borderline  160-189  mg/dL   High  >190     mg/dL   Very High    Physical Exam:    VS:  BP 120/68 (BP Location: Right Arm, Patient Position: Sitting, Cuff Size: Normal)   Pulse 82   Ht 5\' 4"  (1.626 m)   Wt 183 lb (83 kg)   SpO2 97%   BMI 31.41 kg/m     Wt Readings from Last 3 Encounters:  10/19/18 183 lb (83 kg)     GEN: Well nourished, well developed in no acute distress HEENT: Normal NECK: No JVD; No carotid bruits LYMPHATICS: No lymphadenopathy CARDIAC: S1S2 noted,RRR, no murmurs, rubs, gallops RESPIRATORY:  Clear to auscultation without rales, wheezing or rhonchi  ABDOMEN: Soft, non-tender, non-distended, +bowel sounds, no guarding. EXTREMITIES: No edema, No cyanosis, no clubbing MUSCULOSKELETAL:  No edema; No deformity  SKIN: Warm and dry NEUROLOGIC:  Alert and oriented x 3, non-focal PSYCHIATRIC:  Normal affect, good insight  ASSESSMENT:    1. Angina pectoris ([place])   2. Stable angina pectoris ([place])   3. Essential hypertension   4. Type 2 diabetes mellitus without complication, without long-term current use of insulin ([place])   5. Mixed hyperlipidemia   6. Tobacco abuse    PLAN:    At this time the I will like to  pursue a ischemic evaluation with a  CTA of the coronaries.  The patient does not have any contrast allergies. She is in agreement to proceed with this testing.   She will continue on her current medication regimen.    Tobacco cessation was discussed with patient - at this time she is not ready to quit smoking.  The patient is in agreement with the above plan. The patient left the office in stable condition.  The patient will follow up in 3 months.   Medication Adjustments/Labs and Tests Ordered: Current medicines are reviewed at length with the patient today.  Concerns regarding medicines are outlined above.  Orders Placed This Encounter  Procedures  . CT CORONARY MORPH W/CTA COR W/SCORE W/CA W/CM &/OR WO/CM  . CT CORONARY FRACTIONAL FLOW RESERVE DATA PREP  . CT CORONARY FRACTIONAL FLOW RESERVE FLUID ANALYSIS  . Basic Metabolic Panel (BMET)  . EKG 12-Lead   Meds ordered this encounter  Medications  . metoprolol tartrate (LOPRESSOR) 50 MG tablet    Sig: Take 2 tablets (100 mg total) by mouth once for 1 dose. TAKE 2 tabs two hours prior to procedure if HR is 55 or greater    Dispense:  180 tablet    Refill:  0    Patient Instructions  Medication Instructions:  Your physician recommends that you continue on your current medications as directed. Please refer to the Current Medication list given to you today.  If you need a refill on your cardiac medications before your next appointment, please call your pharmacy.   Lab work: You  will need to come in 7 days prior to procedure to have a BMP drawn.  If you have labs (blood work) drawn today and your tests are completely normal, you will receive your results only by: [name] MyChart Message (if you have MyChart) OR . A paper copy in the mail If you have any lab test that is abnormal or we need to change your treatment, we will call you to review the results.  Testing/Procedures: Non-Cardiac CT scanning, (CAT scanning), is a noninvasive,  special x-ray that produces cross-sectional images of the body using x-rays and a computer. CT scans help physicians diagnose and treat medical conditions. For some CT exams, a contrast material is used to enhance visibility in the area of the body being studied. CT scans provide greater clarity and reveal more details than regular x-ray exams.  Please arrive at the [facility] main entrance of [facility] at xx:xx AM (30-45 minutes prior to test start time)  [facility] [place], [place] [address] [phone]  Proceed to the [facility] Radiology Department (First Floor).  Please follow these instructions carefully (unless otherwise directed):  On the Night Before the Test: . Be sure to Drink plenty of water. . Do not consume any caffeinated/decaffeinated beverages or chocolate 12 hours prior to your test. . Do not take any antihistamines 12 hours prior to your test.  On the Day of the Test: . Drink plenty of water. Do not drink any water within one hour of the test. . Do not eat any food 4 hours prior to the test. . You may take your regular medications prior to the test.  . Take metoprolol (Lopressor) two hours prior to test. . HOLD Furosemide/Hydrochlorothiazide morning of the test.      After the Test: . Drink plenty of water. . After receiving IV contrast, you may experience a mild flushed feeling. This is normal. . On occasion, you may experience a mild rash up to 24 hours after the test. This is not dangerous. If this occurs, you can take Benadryl 25 mg and increase your fluid intake. . If you experience trouble breathing, this can be serious. If it is severe call 911 IMMEDIATELY. If it is mild, please call our office. . If you take any of these medications: Glipizide/Metformin, Avandament, Glucavance, please do not take 48 hours after completing test.   Follow-Up: At [facility], you and your health needs are our priority.  As part of  our continuing mission to provide you with exceptional heart care, we have created designated Provider Care Teams.  These Care Teams include your primary Cardiologist (physician) and Advanced Practice Providers (APPs -  Physician Assistants and Nurse Practitioners) who all work together to provide you with the care you need, when you need it. You will need a follow up appointment in 3 months.    Any Other Special Instructions Will Be Listed Below  Metoprolol tablets What is this medicine? METOPROLOL (me TOE proe lole) is a beta-blocker. Beta-blockers reduce the workload on the heart and help it to beat more regularly. This medicine is used to treat high blood pressure and to prevent chest pain. It is also used to after a heart attack and to prevent an additional heart attack from occurring. This medicine may be used for other purposes; ask your health care provider or pharmacist if you have questions. COMMON BRAND NAME(S): Lopressor What should I tell my health care provider before I take this  medicine? They need to know if you have any of these conditions: -diabetes -heart or vessel disease like slow heart rate, worsening heart failure, heart block, sick sinus syndrome or Raynaud's disease -kidney disease -liver disease -lung or breathing disease, like asthma or emphysema -pheochromocytoma -thyroid disease -an unusual or allergic reaction to metoprolol, other beta-blockers, medicines, foods, dyes, or preservatives -pregnant or trying to get pregnant -breast-feeding How should I use this medicine? Take this medicine by mouth with a drink of water. Follow the directions on the prescription label. Take this medicine immediately after meals. Take your doses at regular intervals. Do not take more medicine than directed. Do not stop taking this medicine suddenly. This could lead to serious heart-related effects. Talk to your pediatrician regarding the use of this medicine in children. Special care  may be needed. Overdosage: If you think you have taken too much of this medicine contact a poison control center or emergency room at once. NOTE: This medicine is only for you. Do not share this medicine with others. What if I miss a dose? If you miss a dose, take it as soon as you can. If it is almost time for your next dose, take only that dose. Do not take double or extra doses. What may interact with this medicine? This medicine may interact with the following medications: -certain medicines for blood pressure, heart disease, irregular heart beat -certain medicines for depression like monoamine oxidase (MAO) inhibitors, fluoxetine, or paroxetine -clonidine -dobutamine -epinephrine -isoproterenol -reserpine This list may not describe all possible interactions. Give your health care provider a list of all the medicines, herbs, non-prescription drugs, or dietary supplements you use. Also tell them if you smoke, drink alcohol, or use illegal drugs. Some items may interact with your medicine. What should I watch for while using this medicine? Visit your doctor or health care professional for regular check ups. Contact your doctor right away if your symptoms worsen. Check your blood pressure and pulse rate regularly. Ask your health care professional what your blood pressure and pulse rate should be, and when you should contact them. You may get drowsy or dizzy. Do not drive, use machinery, or do anything that needs mental alertness until you know how this medicine affects you. Do not sit or stand up quickly, especially if you are an older patient. This reduces the risk of dizzy or fainting spells. Contact your doctor if these symptoms continue. Alcohol may interfere with the effect of this medicine. Avoid alcoholic drinks. What side effects may I notice from receiving this medicine? Side effects that you should report to your doctor or health care professional as soon as possible: -allergic  reactions like skin rash, itching or hives -cold or numb hands or feet -depression -difficulty breathing -faint -fever with sore throat -irregular heartbeat, chest pain -rapid weight gain -swollen legs or ankles Side effects that usually do not require medical attention (report to your doctor or health care professional if they continue or are bothersome): -anxiety or nervousness -change in sex drive or performance -dry skin -headache -nightmares or trouble sleeping -short term memory loss -stomach upset or diarrhea -unusually tired This list may not describe all possible side effects. Call your doctor for medical advice about side effects. You may report side effects to FDA at 1-800-FDA-1088. Where should I keep my medicine? Keep out of the reach of children. Store at room temperature between 15 and 30 degrees C (59 and 86 degrees F). Throw away any unused medicine after the  expiration date. NOTE: This sheet is a summary. It may not cover all possible information. If you have questions about this medicine, talk to your doctor, pharmacist, or health care provider.  2019 Elsevier/Gold Standard (2012-08-27 14:40:36)   Coronary Angiogram A coronary angiogram is an X-ray procedure that is used to examine the arteries in the heart. In this procedure, a dye (contrast dye) is injected through a long, thin tube (catheter). The catheter is inserted through the groin, wrist, or arm. The dye is injected into each artery, then X-rays are taken to show if there is a blockage in the arteries of the heart. This procedure can also show if you have valve disease or a disease of the aorta, and it can be used to check the overall function of your heart muscle. You may have a coronary angiogram if:  You are having chest pain, or other symptoms of angina, and you are at risk for heart disease.  You have an abnormal electrocardiogram (ECG) or stress test.  You have chest pain and heart failure.  You  are having irregular heart rhythms.  You and your health care provider determine that the benefits of the test information outweigh the risks of the procedure. Let your health care provider know about:  Any allergies you have, including allergies to contrast dye.  All medicines you are taking, including vitamins, herbs, eye drops, creams, and over-the-counter medicines.  Any problems you or family members have had with anesthetic medicines.  Any blood disorders you have.  Any surgeries you have had.  History of kidney problems or kidney failure.  Any medical conditions you have.  Whether you are pregnant or may be pregnant. What are the risks? Generally, this is a safe procedure. However, problems may occur, including:  Infection.  Allergic reaction to medicines or dyes that are used.  Bleeding from the access site or other locations.  Kidney injury, especially in people with impaired kidney function.  Stroke (rare).  Heart attack (rare).  Damage to other structures or organs. What happens before the procedure? Staying hydrated Follow instructions from your health care provider about hydration, which may include:  Up to 2 hours before the procedure - you may continue to drink clear liquids, such as water, clear fruit juice, black coffee, and plain tea. Eating and drinking restrictions Follow instructions from your health care provider about eating and drinking, which may include:  8 hours before the procedure - stop eating heavy meals or foods such as meat, fried foods, or fatty foods.  6 hours before the procedure - stop eating light meals or foods, such as toast or cereal.  2 hours before the procedure - stop drinking clear liquids. General instructions  Ask your health care provider about: ? Changing or stopping your regular medicines. This is especially important if you are taking diabetes medicines or blood thinners. ? Taking medicines such as ibuprofen.  These medicines can thin your blood. Do not take these medicines before your procedure if your health care provider instructs you not to, though aspirin may be recommended prior to coronary angiograms.  Plan to have someone take you home from the hospital or clinic.  You may need to have blood tests or X-rays done. What happens during the procedure?  An IV tube will be inserted into one of your veins.  You will be given one or more of the following: ? A medicine to help you relax (sedative). ? A medicine to numb the area where the  catheter will be inserted into an artery (local anesthetic).  To reduce your risk of infection: ? Your health care team will wash or sanitize their hands. ? Your skin will be washed with soap. ? Hair may be removed from the area where the catheter will be inserted.  You will be connected to a continuous ECG monitor.  The catheter will be inserted into an artery. The location may be in your groin, in your wrist, or in the fold of your arm (near your elbow).  A type of X-ray (fluoroscopy) will be used to help guide the catheter to the opening of the blood vessel that is being examined.  A dye will be injected into the catheter, and X-rays will be taken. The dye will help to show where any narrowing or blockages are located in the heart arteries.  Tell your health care provider if you have any chest pain or trouble breathing during the procedure.  If blockages are found, your health care provider may perform another procedure, such as inserting a coronary stent. The procedure may vary among health care providers and hospitals. What happens after the procedure?  After the procedure, you will need to keep the area still for a few hours, or for as long as told by your health care provider. If the procedure is done through the groin, you will be instructed to not bend and not cross your legs.  The insertion site will be checked frequently.  The pulse in your  foot or wrist will be checked frequently.  You may have additional blood tests, X-rays, and a test that records the electrical activity of your heart (ECG).  Do not drive for 24 hours if you were given a sedative. Summary  A coronary angiogram is an X-ray procedure that is used to look into the arteries in the heart.  During the procedure, a dye (contrast dye) is injected through a long, thin tube (catheter). The catheter is inserted through the groin, wrist, or arm.  Tell your health care provider about any allergies you have, including allergies to contrast dye.  After the procedure, you will need to keep the area still for a few hours, or for as long as told by your health care provider. This information is not intended to replace advice given to you by your health care provider. Make sure you discuss any questions you have with your health care provider. Document Released: 06/29/2002 Document Revised: 10/05/2015 Document Reviewed: 10/05/2015 Elsevier Interactive Patient Education  2019 [employer].       Adopting a Healthy Lifestyle.  Know what a healthy weight is for you (roughly BMI <25) and aim to maintain this   Aim for 7+ servings of fruits and vegetables daily   65-80+ fluid ounces of water or unsweet tea for healthy kidneys   Limit to max 1 drink of alcohol per day; avoid smoking/tobacco   Limit animal fats in diet for cholesterol and heart health - choose grass fed whenever available   Avoid highly processed foods, and foods high in saturated/trans fats   Aim for low stress - take time to unwind and care for your mental health   Aim for 150 min of moderate intensity exercise weekly for heart health, and weights twice weekly for bone health   Aim for 7-9 hours of sleep daily   When it comes to diets, agreement about the perfect plan isnt easy to find, even among the experts. Experts at the [facility] developed an idea known as the Healthy  Eating Plate. Just imagine a plate divided into logical, healthy portions.   The emphasis is on diet quality:   Load up on vegetables and fruits - one-half of your plate: Aim for color and variety, and remember that potatoes dont count.   Go for whole grains - one-quarter of your plate: Whole wheat, barley, wheat berries, quinoa, oats, brown rice, and foods made with them. If you want pasta, go with whole wheat pasta.   Protein power - one-quarter of your plate: Fish, chicken, beans, and nuts are all healthy, versatile protein sources. Limit red meat.   The diet, however, does go beyond the plate, offering a few other suggestions.   Use healthy plant oils, such as olive, canola, soy, corn, sunflower and peanut. Check the labels, and avoid partially hydrogenated oil, which have unhealthy trans fats.   If youre thirsty, drink water. Coffee and tea are good in moderation, but skip sugary drinks and limit milk and dairy products to one or two daily servings.   The type of carbohydrate in the diet is more important than the amount. Some sources of carbohydrates, such as vegetables, fruits, whole grains, and beans-are healthier than others.   Finally, stay active  Signed, Berniece Salines, DO  10/19/2018 10:24 PM    [facility]

## 2018-10-22 NOTE — Addendum Note (Signed)
Addended by: Stevan Born on: 10/22/2018 09:27 AM   Modules accepted: Orders

## 2018-10-25 MED ORDER — METOPROLOL TARTRATE 50 MG PO TABS: 100.0000 mg | ORAL_TABLET | Freq: Once | ORAL | 0 refills | Status: DC

## 2018-10-25 NOTE — Addendum Note (Signed)
Addended by: Particia Nearing B on: 10/25/2018 12:12 PM   Modules accepted: Orders

## 2018-11-01 ENCOUNTER — Ambulatory Visit (INDEPENDENT_AMBULATORY_CARE_PROVIDER_SITE_OTHER): Payer: Medicare Other | Admitting: Cardiology

## 2018-11-01 ENCOUNTER — Other Ambulatory Visit: Payer: Self-pay

## 2018-11-01 ENCOUNTER — Encounter: Payer: Self-pay | Admitting: Cardiology

## 2018-11-01 VITALS — BP 145/76 | HR 83 | Temp 97.5°F | Ht 64.0 in | Wt 185.3 lb

## 2018-11-01 DIAGNOSIS — I447 Left bundle-branch block, unspecified: Secondary | ICD-10-CM

## 2018-11-01 DIAGNOSIS — R0609 Other forms of dyspnea: Secondary | ICD-10-CM

## 2018-11-01 DIAGNOSIS — Z8249 Family history of ischemic heart disease and other diseases of the circulatory system: Secondary | ICD-10-CM

## 2018-11-01 DIAGNOSIS — M48062 Spinal stenosis, lumbar region with neurogenic claudication: Secondary | ICD-10-CM

## 2018-11-01 DIAGNOSIS — R0789 Other chest pain: Secondary | ICD-10-CM | POA: Diagnosis not present

## 2018-11-01 DIAGNOSIS — R0600 Dyspnea, unspecified: Secondary | ICD-10-CM

## 2018-11-01 DIAGNOSIS — I10 Essential (primary) hypertension: Secondary | ICD-10-CM

## 2018-11-01 DIAGNOSIS — E7800 Pure hypercholesterolemia, unspecified: Secondary | ICD-10-CM

## 2018-11-01 NOTE — Progress Notes (Signed)
Primary Physician/Referring:  [place], Sarah T, MD  Patient ID: [name], female    DOB: Jul 15, 1950, 68 y.o.   MRN: [id number]  Chief Complaint  Patient presents with  . Hospitalization Follow-up  . Chest Pain  . Abnormal ECG   HPI:    HPI: [name]  is a 68 y.o. with hx of CAD with 20% mid LAD stenosis by angiogram in 2005 and a negative nuclear stress in June 2020.[name]  Seen in [place] on 10/13/2018 ED and excluded from MI and could not get a bed at [facility] and hence discharged.  Chest pain described as pressure, continuous and worse on taking a deep breath, troponin was negative and EKG revealed unchanged LBBB. Pain relieved with narcotics. Chest pain described as sharp pain with radiation to the back, worse on taking deep breath.  Chest pain is continuous and lasted all day when she presented to [facility] emergency room.  She was ruled out for myocardial infarction and as she could not wait any longer to be transferred to [facility], as her chest pain symptoms are Better, the following morning she was discharged from the emergency room.  She was advised to follow-up with me.  She was then evaluated by Dr. Berniece Salines ([place]) who recommend coronary CTA for further evaluation.   States that last night she had an episode of sharp chest pain but was not as intense and lasted several hours but subsides spontaneously.  No other associated symptoms.  She continues to have severe GERD and states that she's been having difficulty with swallowing.  Past Medical History:  Diagnosis Date  . Asthma   . COPD (chronic obstructive pulmonary disease) ([place])   . Diastolic dysfunction   . Diverticulosis   . Fatty liver   . GERD without esophagitis   . Hx of Clostridium difficile infection   . Hx of colonic polyps   . Hx of small bowel obstruction   . Hypertension, benign   . Hypothyroidism   . Irritable bowel syndrome   . Lumbar herniated disc    . Mixed hyperlipidemia   . Osteoarthritis    Past Surgical History:  Procedure Laterality Date  . ABDOMINAL HYSTERECTOMY  2002  . APPENDECTOMY  2005  . CARPAL TUNNEL RELEASE Bilateral   . SMALL INTESTINE SURGERY  11/26/2012   Bowel obstruction, Dr. Pauletta Browns  . TUBAL LIGATION  1980   Social History   Socioeconomic History  . Marital status: Divorced    Spouse name: Not on file  . Number of children: 3  . Years of education: Not on file  . Highest education level: Not on file  Occupational History  . Not on file  Social Needs  . Financial resource strain: Not on file  . Food insecurity    Worry: Not on file    Inability: Not on file  . Transportation needs    Medical: Not on file    Non-medical: Not on file  Tobacco Use  . Smoking status: Former Smoker    Packs/day: 2.00    Quit date: 11/2012    Years since quitting: 5.9  . Smokeless tobacco: Never Used  Substance and Sexual Activity  . Alcohol use: Not Currently  . Drug use: Not Currently  . Sexual activity: Not on file  Lifestyle  . Physical activity    Days per week: Not on file    Minutes per session: Not on file  . Stress:  Not on file  Relationships  . Social Herbalist on phone: Not on file    Gets together: Not on file    Attends religious service: Not on file    Active member of club or organization: Not on file    Attends meetings of clubs or organizations: Not on file    Relationship status: Not on file  . Intimate partner violence    Fear of current or ex partner: Not on file    Emotionally abused: Not on file    Physically abused: Not on file    Forced sexual activity: Not on file  Other Topics Concern  . Not on file  Social History Narrative  . Not on file   ROS  Review of Systems  Constitution: Negative for chills, decreased appetite, malaise/fatigue and weight gain.  Cardiovascular: Positive for dyspnea on exertion. Negative for leg swelling and syncope.  Endocrine: Negative  for cold intolerance.  Hematologic/Lymphatic: Does not bruise/bleed easily.  Musculoskeletal: Positive for back pain, joint pain and muscle cramps. Negative for joint swelling.  Gastrointestinal: Positive for heartburn. Negative for abdominal pain, anorexia, change in bowel habit, hematochezia and melena.  Neurological: Negative for headaches and light-headedness.  Psychiatric/Behavioral: Negative for depression and substance abuse.  All other systems reviewed and are negative.  Objective  Blood pressure (!) 145/76, pulse 83, temperature (!) 97.5 F (36.4 C), height 5\' 4"  (1.626 m), weight 185 lb 4.8 oz (84.1 kg), SpO2 98 %. Body mass index is 31.81 kg/m.   Physical Exam  HENT:  Head: Atraumatic.  Eyes: Conjunctivae are normal.  Neck: Neck supple. No JVD present. No thyromegaly present.  Cardiovascular: Normal rate, regular rhythm, normal heart sounds and intact distal pulses. Exam reveals no gallop.  No murmur heard. No leg edema, no JVD.  Pulmonary/Chest: Effort normal and breath sounds normal.  Abdominal: Soft. Bowel sounds are normal.  Musculoskeletal: Normal range of motion.  Neurological: She is alert.  Skin: Skin is warm and dry.  Psychiatric: She has a normal mood and affect.   Radiology: No results found.  Laboratory examination:   Labs 10/60/2020: CBC normal, BUN 28, creatinine 1.10, serum glucose 115 mg.  No results for input(s): NA, K, CL, CO2, GLUCOSE, BUN, CREATININE, CALCIUM, GFRNONAA, GFRAA in the last 8760 hours. CMP Latest Ref Rng & Units 09/20/2008 05/25/2007 01/20/2007  Glucose 70 - 99 mg/dL 146(H) 124(H) 107(H)  BUN 6 - 23 mg/dL 9 21 21   Creatinine 0.4 - 1.2 mg/dL 0.8 1.2 1.08  Sodium 135 - 145 mEq/L 141 139 140  Potassium 3.5 - 5.1 mEq/L 3.7 4.6 4.1  Chloride 96 - 112 mEq/L 105 108 108  CO2 - - - 27  Calcium - - - 8.8  Total Protein - - - -  Total Bilirubin - - - -  Alkaline Phos - - - -  AST - - - -  ALT - - - -   CBC Latest Ref Rng & Units  09/20/2008 09/20/2008 05/25/2007  WBC 4.0 - 10.5 K/uL - 5.9 -  Hemoglobin 12.0 - 15.0 g/dL 14.6 14.8 13.6  Hematocrit 36.0 - 46.0 % 43.0 41.8 40.0  Platelets 150 - 400 K/uL - 184 -   Lipid Panel     Component Value Date/Time   CHOL  01/20/2007 0505    96        ATP III CLASSIFICATION:  <200     mg/dL   Desirable  200-239  mg/dL  Borderline High  >=240    mg/dL   High          TRIG 159 (H) 01/20/2007 0505   HDL 28 (L) 01/20/2007 0505   CHOLHDL 3.4 01/20/2007 0505   VLDL 32 01/20/2007 0505   LDLCALC  01/20/2007 0505    36        Total Cholesterol/HDL:CHD Risk Coronary Heart Disease Risk Table                     Men   Women  1/2 Average Risk   3.4   3.3  Average Risk       5.0   4.4  2 X Average Risk   9.6   7.1  3 X Average Risk  23.4   11.0        Use the calculated Patient Ratio above and the CHD Risk Table to determine the patient's CHD Risk.        ATP III CLASSIFICATION (LDL):  <100     mg/dL   Optimal  100-129  mg/dL   Near or Above                    Optimal  130-159  mg/dL   Borderline  160-189  mg/dL   High  >190     mg/dL   Very High   HEMOGLOBIN A1C No results found for: HGBA1C, MPG TSH No results for input(s): TSH in the last 8760 hours. Medications   Current Outpatient Medications  Medication Instructions  . BREO ELLIPTA 100-25 MCG/INH AEPB No dose, route, or frequency recorded.  . calcium carbonate (TUMS - DOSED IN MG ELEMENTAL CALCIUM) 500 MG chewable tablet 1 tablet, Oral, Daily  . gabapentin (NEURONTIN) 300 mg, 3 times daily  . levothyroxine (SYNTHROID) 75 mcg, Daily  . lisinopril-hydrochlorothiazide (ZESTORETIC) 10-12.5 MG tablet Daily  . Otezla 30 mg, Daily  . pantoprazole (PROTONIX) 40 mg, Daily  . pravastatin (PRAVACHOL) 80 mg, Daily  . TREMFYA 100 MG/ML SOSY q50months  . Viberzi 75 mg, Oral, 2 times daily    Cardiac Studies:   Coronary Angiography 2005: Non obstructive coronary artery disease (20% mid LAD disease)  Lexiscan Myoview  stress test 06/07/2018: Large fixed inferior defect consistent with attenuation artifact with normal LVEF.   Assessment     ICD-10-CM   1. Musculoskeletal chest pain  R07.89 EKG 12-Lead  2. Family history of premature CAD  Z86.49    Father with an MI at age 81  3. Dyspnea on exertion  R06.00   4. Essential hypertension  I10   5. Hypercholesteremia  E78.00     EKG 11/01/2018: Normal sinus rhythm at rate of sinus on beats per minute, left bundle branch block.  No further analysis.  Recommendations:   Patient with extensive noncardiac history including COPD, degenerative joint disease and spine disease, ongoing tobacco use disorder, hypertension, hyperlipidemia, referred by the emergency room for evaluation of abnormal EKG and chest pain.  Chest pain symptoms clearly appeared to be musculoskeletal.  I highly doubt this was ACS.  However she has continued cardiovascular risk factors.  Recent stress test also reviewed, low risk stress test.  She has been scheduled for coronary CTA which is appropriate however in view of history of significant smoking, calcification of the coronary arteries may cast a difficulty in imaging.  We will await for the coronary CTA to come back and then make decisions regarding the direction in which we need to  proceed.  Extensive discussion had regarding smoking cessation.  She cannot take aspirin due to severe GERD and hence I did not make any changes to her medications.  She is presently on statins, continue the same.  I'll obtain an echocardiogram to evaluate her dyspnea and chest pain.  I do not think that chest pain symptoms were suggestive of aortic dissection, it is easily reproducible on deep breath and some features are very suggestive of esophageal dysmotility and severe GERD.  She was then evaluated by Dr. Berniece Salines ([place]) who recommend coronary CTA for further evaluation.  I was not aware of this until I am charting. We will inform  patient that she can continue to f/u with Dr Harriet Masson and I am happy to be available if needed.   Adrian Prows, MD, [facility] 11/01/2018, 3:39 PM [facility] [place] Pager: [phone] Office: [phone] If no answer Cell [phone]

## 2018-11-12 ENCOUNTER — Other Ambulatory Visit: Payer: Medicare Other

## 2018-11-30 LAB — BASIC METABOLIC PANEL
BUN/Creatinine Ratio: 25 (ref 12–28)
BUN: 23 mg/dL (ref 8–27)
CO2: 25 mmol/L (ref 20–29)
Calcium: 9.4 mg/dL (ref 8.7–10.3)
Chloride: 100 mmol/L (ref 96–106)
Creatinine, Ser: 0.92 mg/dL (ref 0.57–1.00)
GFR calc Af Amer: 73 mL/min/{1.73_m2} (ref >59)
GFR calc non Af Amer: 64 mL/min/{1.73_m2} (ref >59)
Glucose: 117 mg/dL — ABNORMAL HIGH (ref 65–99)
Potassium: 4.1 mmol/L (ref 3.5–5.2)
Sodium: 140 mmol/L (ref 134–144)

## 2018-12-01 ENCOUNTER — Encounter: Payer: Self-pay | Admitting: *Deleted

## 2018-12-13 ENCOUNTER — Telehealth (HOSPITAL_COMMUNITY): Payer: Self-pay | Admitting: Emergency Medicine

## 2018-12-13 ENCOUNTER — Ambulatory Visit: Payer: Medicare Other | Admitting: Cardiology

## 2018-12-13 NOTE — Telephone Encounter (Signed)
Thank for letting me know.

## 2018-12-13 NOTE — Telephone Encounter (Signed)
attempted phone call. Significant other told me she was taken to local hospital for large lump that appeared on her throat after an ear ache. Will call me back with questions regarding test or that shes been admitted and will postpone test.   [name]

## 2018-12-14 ENCOUNTER — Ambulatory Visit (HOSPITAL_COMMUNITY): Payer: Medicare Other

## 2018-12-14 ENCOUNTER — Telehealth (HOSPITAL_COMMUNITY): Payer: Self-pay | Admitting: Emergency Medicine

## 2018-12-14 NOTE — Telephone Encounter (Signed)
Phone call from patient who reports her CCTA appt for today at 43 had been cancelled without her knowledge.   I told her I wasn't sure who would have cancelled since she and I had a conversation this morning about instructions with her plans to be at [facility] at 2pm.   Message sent to scheduling to r/s patient as she had already left the [facility] campus to return home   [place] [facility] [phone] Office  [phone] Cell

## 2018-12-14 NOTE — Telephone Encounter (Signed)
Pt returning phone call regarding upcoming cardiac imaging study; pt verbalizes understanding of appt date/time, parking situation and where to check in, pre-test NPO status and medications ordered, and verified current allergies; name and call back number provided for further questions should they arise Rachel Bond RN Navigator Cardiac Imaging [facility] [phone] office [phone] cell  Pt states she was diagnosed with an infected parotid gland and is on antibiotics.

## 2018-12-20 ENCOUNTER — Telehealth (HOSPITAL_COMMUNITY): Payer: Self-pay | Admitting: Emergency Medicine

## 2018-12-20 NOTE — Telephone Encounter (Signed)
Reaching out to patient to offer assistance regarding upcoming cardiac imaging study; pt verbalizes understanding of appt date/time, parking situation and where to check in, pre-test NPO status and medications ordered, and verified current allergies; name and call back number provided for further questions should they arise Ryliegh Mcduffey RN Navigator Cardiac Imaging [facility] [phone] office [phone] cell 

## 2018-12-21 ENCOUNTER — Other Ambulatory Visit: Payer: Self-pay

## 2018-12-21 ENCOUNTER — Ambulatory Visit (HOSPITAL_COMMUNITY)
Admission: RE | Admit: 2018-12-21 | Discharge: 2018-12-21 | Disposition: A | Discharge status: Home / Self Care | Payer: Medicare Other | Source: Ambulatory Visit | Attending: Cardiology | Admitting: Cardiology

## 2018-12-21 DIAGNOSIS — I2510 Atherosclerotic heart disease of native coronary artery without angina pectoris: Secondary | ICD-10-CM

## 2018-12-21 DIAGNOSIS — I208 Other forms of angina pectoris: Secondary | ICD-10-CM | POA: Insufficient documentation

## 2018-12-21 MED ORDER — NITROGLYCERIN 0.4 MG SL SUBL
SUBLINGUAL_TABLET | SUBLINGUAL | Status: AC
  Filled 2018-12-21: qty 2

## 2018-12-21 MED ORDER — NITROGLYCERIN 0.4 MG SL SUBL
0.8000 mg | SUBLINGUAL_TABLET | Freq: Once | SUBLINGUAL | Status: AC
  Administered 2018-12-21: 15:00:00 0.8 mg via SUBLINGUAL

## 2018-12-21 MED ORDER — IOHEXOL 350 MG/ML SOLN
80.0000 mL | Freq: Once | INTRAVENOUS | Status: AC | PRN
  Administered 2018-12-21: 15:00:00 80 mL via INTRAVENOUS

## 2018-12-22 ENCOUNTER — Telehealth: Payer: Self-pay | Admitting: *Deleted

## 2018-12-22 MED ORDER — ASPIRIN EC 81 MG PO TBEC: 81.0000 mg | DELAYED_RELEASE_TABLET | Freq: Every day | ORAL | 3 refills | Status: AC

## 2018-12-22 NOTE — Telephone Encounter (Signed)
-----   Message from Berniece Salines, DO sent at 12/21/2018 10:18 PM EST ----- I would like to see the patient Thursday or Friday discussed her CT results.  Meanwhile I like her to start aspirin 81 mg daily.  Please let her know I like to discuss with her about further testing based on her CT coronary findings.

## 2018-12-22 NOTE — Telephone Encounter (Signed)
Telephone call to patient. Appointment made for 12/23/18 at 2:55PM Instructed to take baby aspirin daily

## 2018-12-23 ENCOUNTER — Ambulatory Visit (INDEPENDENT_AMBULATORY_CARE_PROVIDER_SITE_OTHER): Payer: Medicare Other | Admitting: Cardiology

## 2018-12-23 ENCOUNTER — Encounter: Payer: Self-pay | Admitting: Cardiology

## 2018-12-23 ENCOUNTER — Other Ambulatory Visit: Payer: Self-pay

## 2018-12-23 VITALS — BP 128/62 | HR 60 | Ht 64.0 in | Wt 188.0 lb

## 2018-12-23 DIAGNOSIS — E782 Mixed hyperlipidemia: Secondary | ICD-10-CM | POA: Diagnosis not present

## 2018-12-23 DIAGNOSIS — I25118 Atherosclerotic heart disease of native coronary artery with other forms of angina pectoris: Secondary | ICD-10-CM

## 2018-12-23 DIAGNOSIS — Z720 Tobacco use: Secondary | ICD-10-CM | POA: Diagnosis not present

## 2018-12-23 DIAGNOSIS — I2510 Atherosclerotic heart disease of native coronary artery without angina pectoris: Secondary | ICD-10-CM | POA: Diagnosis not present

## 2018-12-23 DIAGNOSIS — I10 Essential (primary) hypertension: Secondary | ICD-10-CM

## 2018-12-23 DIAGNOSIS — Z01812 Encounter for preprocedural laboratory examination: Secondary | ICD-10-CM

## 2018-12-23 DIAGNOSIS — E118 Type 2 diabetes mellitus with unspecified complications: Secondary | ICD-10-CM | POA: Insufficient documentation

## 2018-12-23 MED ORDER — ROSUVASTATIN CALCIUM 40 MG PO TABS: 40.0000 mg | ORAL_TABLET | Freq: Every day | ORAL | 1 refills | Status: DC

## 2018-12-23 MED ORDER — ISOSORBIDE MONONITRATE ER 30 MG PO TB24: 30.0000 mg | ORAL_TABLET | Freq: Every day | ORAL | 1 refills | Status: DC

## 2018-12-23 NOTE — Patient Instructions (Signed)
Medication Instructions:  Your physician has recommended you make the following change in your medication:   STOP: Pravachol  START: Crestor(rosuvastatin) 40 mg Take 1 tab daily START: Imdur(isosorbide) 30 mg Take 1 tab daily  *If you need a refill on your cardiac medications before your next appointment, please call your pharmacy*  Lab Work: Your physician recommends that you return for lab work in: [place] CBC,BMP  If you have labs (blood work) drawn today and your tests are completely normal, you will receive your results only by: [name] MyChart Message (if you have MyChart) OR . A paper copy in the mail If you have any lab test that is abnormal or we need to change your treatment, we will call you to review the results.  Testing/Procedures:    [address] [place] [address] Dept: [phone] Loc: [place]  12/23/2018  You are scheduled for a Cardiac Catheterization on Tuesday, December 22 with Dr. Daneen Schick.  1. Please arrive at the [facility] (Main Entrance A) at [facility]: [address] [place], [place] [address] at 8:30 AM (This time is two hours before your procedure to ensure your preparation). Free valet parking service is available.   Special note: Every effort is made to have your procedure done on time. Please understand that emergencies sometimes delay scheduled procedures.  2. Diet: Do not eat solid foods after midnight.  The patient may have clear liquids until 5am upon the day of the procedure.  3. Labs: You will need to have a COVID screen. Your appointment is Friday Dec 18 at 12:40 at [facility]  4. Medication instructions in preparation for your procedure:   Contrast Allergy: No  On the morning of your procedure, take your Aspirin and any morning medicines NOT listed above.  You may use sips of water.  5.  Plan for one night stay--bring personal belongings. 6. Bring a current list of your medications and current insurance cards. 7. You MUST have a responsible person to drive you home. 8. Someone MUST be with you the first 24 hours after you arrive home or your discharge will be delayed. 9. Please wear clothes that are easy to get on and off and wear slip-on shoes.  Thank you for allowing [place] to care for you!   -- [facility]  Follow-Up: At [facility], you and your health needs are our priority.  As part of our continuing mission to provide you with exceptional heart care, we have created designated Provider Care Teams.  These Care Teams include your primary Cardiologist (physician) and Advanced Practice Providers (APPs -  Physician Assistants and Nurse Practitioners) who all work together to provide you with the care you need, when you need it.  Your next appointment:   1 month(s)  The format for your next appointment:   In Person  Provider:   Berniece Salines, DO  Other Instructions

## 2018-12-23 NOTE — Progress Notes (Signed)
Cardiology Office Note:    Date:  12/23/2018   ID:  [name], DOB 07-13-1950, MRN [id number]  PCP:  [place], Sarah T, MD  Cardiologist:  Berniece Salines, DO  Electrophysiologist:  None   Referring MD: [place], Sarah T, MD   Follow-up visit.  History of Present Illness:    [name] is a 68 y.o. female with a hx of CAD, COPD, diabetes, hypertension presents today for follow-up visit.  I last saw the patient October 2020 at which time she was posthospitalization for intermittent substernal chest pain.  During that time her troponins were negative she was discharged home with cardiology follow-up.  During her office visit I was concerned for worsening coronary disease therefore I recommended the patient undergo a coronary CTA.  The patient was able to undergo her coronary CTA and is here for follow-up today.  She tells me that she has been experiencing intermittent chest pain since her last visit.  She denies any shortness of breath.   Past Medical History:  Diagnosis Date  . Asthma   . COPD (chronic obstructive pulmonary disease) ([place])   . Diastolic dysfunction   . Diverticulosis   . Fatty liver   . GERD without esophagitis   . Hx of Clostridium difficile infection   . Hx of colonic polyps   . Hx of small bowel obstruction   . Hypertension, benign   . Hypothyroidism   . Irritable bowel syndrome   . Lumbar herniated disc   . Mixed hyperlipidemia   . Osteoarthritis     Past Surgical History:  Procedure Laterality Date  . ABDOMINAL HYSTERECTOMY  2002  . APPENDECTOMY  2005  . CARPAL TUNNEL RELEASE Bilateral   . SMALL INTESTINE SURGERY  11/26/2012   Bowel obstruction, Dr. Pauletta Browns  . TUBAL LIGATION  1980    Current Medications: Current Meds  Medication Sig  . aspirin EC 81 MG tablet Take 1 tablet (81 mg total) by mouth daily.  [name] BREO ELLIPTA 100-25 MCG/INH AEPB   . calcium carbonate (TUMS - DOSED IN MG ELEMENTAL CALCIUM) 500 MG chewable tablet Chew 1 tablet by  mouth daily.  . Eluxadoline (VIBERZI) 75 MG TABS Take 75 mg by mouth 2 (two) times daily.  [name] gabapentin (NEURONTIN) 300 MG capsule 300 mg 3 (three) times daily.  [name] levothyroxine (SYNTHROID) 75 MCG tablet 75 mcg daily.  [name] lisinopril-hydrochlorothiazide (ZESTORETIC) 10-12.5 MG tablet daily.  [name] OTEZLA 30 MG TABS 30 mg daily.  . pantoprazole (PROTONIX) 40 MG tablet 40 mg daily.  [name] TREMFYA 100 MG/ML SOSY q47months  . [DISCONTINUED] pravastatin (PRAVACHOL) 80 MG tablet 80 mg daily.     Allergies:   Ether, Nickel, and Latex   Social History   Socioeconomic History  . Marital status: Divorced    Spouse name: Not on file  . Number of children: 3  . Years of education: Not on file  . Highest education level: Not on file  Occupational History  . Not on file  Tobacco Use  . Smoking status: Current Every Day Smoker    Packs/day: 1.00    Last attempt to quit: 11/2012    Years since quitting: 6.1  . Smokeless tobacco: Never Used  Substance and Sexual Activity  . Alcohol use: Not Currently  . Drug use: Not Currently  . Sexual activity: Not on file  Other Topics Concern  . Not on file  Social History Narrative  . Not on file   Social Determinants of Health  Financial Resource Strain:   . Difficulty of Paying Living Expenses: Not on file  Food Insecurity:   . Worried About Charity fundraiser in the Last Year: Not on file  . Ran Out of Food in the Last Year: Not on file  Transportation Needs:   . Lack of Transportation (Medical): Not on file  . Lack of Transportation (Non-Medical): Not on file  Physical Activity:   . Days of Exercise per Week: Not on file  . Minutes of Exercise per Session: Not on file  Stress:   . Feeling of Stress : Not on file  Social Connections:   . Frequency of Communication with Friends and Family: Not on file  . Frequency of Social Gatherings with Friends and Family: Not on file  . Attends Religious Services: Not on file  . Active Member of Clubs or  Organizations: Not on file  . Attends Archivist Meetings: Not on file  . Marital Status: Not on file     Family History: The patient's family history includes Colon cancer in her daughter; Lung cancer in her brother; Ovarian cancer in her sister; Stomach cancer in her maternal grandmother.  ROS:   Review of Systems  Constitution: Negative for decreased appetite, fever and weight gain.  HENT: Negative for congestion, ear discharge, hoarse voice and sore throat.   Eyes: Negative for discharge, redness, vision loss in right eye and visual halos.  Cardiovascular: Reports chest pain.  Negative for dyspnea on exertion, leg swelling, orthopnea and palpitations.  Respiratory: Negative for cough, hemoptysis, shortness of breath and snoring.   Endocrine: Negative for heat intolerance and polyphagia.  Hematologic/Lymphatic: Negative for bleeding problem. Does not bruise/bleed easily.  Skin: Negative for flushing, nail changes, rash and suspicious lesions.  Musculoskeletal: Negative for arthritis, joint pain, muscle cramps, myalgias, neck pain and stiffness.  Gastrointestinal: Negative for abdominal pain, bowel incontinence, diarrhea and excessive appetite.  Genitourinary: Negative for decreased libido, genital sores and incomplete emptying.  Neurological: Negative for brief paralysis, focal weakness, headaches and loss of balance.  Psychiatric/Behavioral: Negative for altered mental status, depression and suicidal ideas.  Allergic/Immunologic: Negative for HIV exposure and persistent infections.    EKGs/Labs/Other Studies Reviewed:    The following studies were reviewed today:   EKG: None today  Coronary CTA  RCA is a large dominant artery that gives rise to PDA and PLVB. The RCA is have diffuse coronary calcification: Mild (25-49%) calcified lesions in the proximal and mid portions of the RCA. There is minimal (<25%) calcified plaque.  Left main is a large artery that gives  rise to LAD and LCX arteries. There is a mild (25-49%) calcified plaque in the left main.  LAD is a large vessel with a moderate (50-69%) calcified plaque is the proximal portion. The mid portion of the LAD with a severe (>70%) tubular calcified lesion. The distal portion of the vessel tapers with multiple mild calcified plaques. D1 is a medium sized vessel with moderate (50-69%) calcified lesion at the ostium and into the proximal portion.  LCX is a non-dominant artery. There is a moderate (50-69%) calcified plaque in the proximal portion of the vessel. OM 1 branches off the proximal portion and this is a small caliber vessel.  Recent Labs: 11/30/2018: BUN 23; Creatinine, Ser 0.92; Potassium 4.1; Sodium 140  Recent Lipid Panel    Component Value Date/Time   CHOL  01/20/2007 0505    96        ATP III CLASSIFICATION:  <  200     mg/dL   Desirable  200-239  mg/dL   Borderline High  >=240    mg/dL   High          TRIG 159 (H) 01/20/2007 0505   HDL 28 (L) 01/20/2007 0505   CHOLHDL 3.4 01/20/2007 0505   VLDL 32 01/20/2007 0505   LDLCALC  01/20/2007 0505    36        Total Cholesterol/HDL:CHD Risk Coronary Heart Disease Risk Table                     Men   Women  1/2 Average Risk   3.4   3.3  Average Risk       5.0   4.4  2 X Average Risk   9.6   7.1  3 X Average Risk  23.4   11.0        Use the calculated Patient Ratio above and the CHD Risk Table to determine the patient's CHD Risk.        ATP III CLASSIFICATION (LDL):  <100     mg/dL   Optimal  100-129  mg/dL   Near or Above                    Optimal  130-159  mg/dL   Borderline  160-189  mg/dL   High  >190     mg/dL   Very High    Physical Exam:    VS:  BP 128/62 (BP Location: Right Arm, Patient Position: Sitting, Cuff Size: Normal)   Pulse 60   Ht 5\' 4"  (1.626 m)   Wt 188 lb (85.3 kg)   SpO2 93%   BMI 32.27 kg/m     Wt Readings from Last 3 Encounters:  12/23/18 188 lb (85.3 kg)  11/01/18 185 lb 4.8 oz  (84.1 kg)  10/19/18 183 lb (83 kg)     GEN: Well nourished, well developed in no acute distress HEENT: Normal NECK: No JVD; No carotid bruits LYMPHATICS: No lymphadenopathy CARDIAC: S1S2 noted,RRR, no murmurs, rubs, gallops RESPIRATORY:  Clear to auscultation without rales, wheezing or rhonchi  ABDOMEN: Soft, non-tender, non-distended, +bowel sounds, no guarding. EXTREMITIES: No edema, No cyanosis, no clubbing MUSCULOSKELETAL:  No edema; No deformity  SKIN: Warm and dry NEUROLOGIC:  Alert and oriented x 3, non-focal PSYCHIATRIC:  Normal affect, good insight  ASSESSMENT:    1. Coronary artery disease of native artery of native heart with stable angina pectoris ([place])   2. Mixed hyperlipidemia   3. Essential hypertension   4. Tobacco use   5. Type 2 diabetes mellitus with complication, without long-term current use of insulin ([place])   6. Pre-procedure lab exam    PLAN:     Her coronary CTA does show evidence of moderate to severe diffuse coronary artery disease.  With her recurrent intermittent chest pain, I think at this time a left heart cath will be appropriate.  I have educated patient about this procedure, she has had this procedure in the past and does not have any further questions.  She understands that risks include but are not limited to stroke (1 in 1000), death (1 in 44), kidney failure [usually temporary] (1 in 500), bleeding (1 in 200), allergic reaction [possibly serious] (1 in 200), and agrees to proceed.  In the meantime she will continue aspirin 81 mg daily, I have stopped her pravastatin for more potent Crestor 40 mg daily.  Imdur  30 mg daily has been placed for her anginal symptoms.  She continues to smoke.  The patient was counseled on tobacco cessation today for 5 minutes.  Counseling included reviewing the risks of smoking tobacco products, how it impacts the patient's current medical diagnoses and different strategies for quitting.  Pharmacotherapy to aid in  tobacco cessation was not prescribed today. The patient coordinate with  primary care provider.  The patient was also advised to call  1-800-QUIT-NOW 7438098515) for additional help with quitting smoking.  Hyperlipidemia-Pravachol has been stopped she will be started on Crestor 40 mg daily.  Diabetes- Per primary provider.  The patient is in agreement with the above plan. The patient left the office in stable condition.  The patient will follow up in 1 month or sooner if needed.   Medication Adjustments/Labs and Tests Ordered: Current medicines are reviewed at length with the patient today.  Concerns regarding medicines are outlined above.  Orders Placed This Encounter  Procedures  . Basic Metabolic Panel (BMET)  . CBC   Meds ordered this encounter  Medications  . DISCONTD: rosuvastatin (CRESTOR) 40 MG tablet    Sig: Take 1 tablet (40 mg total) by mouth daily.    Dispense:  90 tablet    Refill:  1  . DISCONTD: isosorbide mononitrate (IMDUR) 30 MG 24 hr tablet    Sig: Take 1 tablet (30 mg total) by mouth daily.    Dispense:  90 tablet    Refill:  1  . rosuvastatin (CRESTOR) 40 MG tablet    Sig: Take 1 tablet (40 mg total) by mouth daily.    Dispense:  90 tablet    Refill:  1  . isosorbide mononitrate (IMDUR) 30 MG 24 hr tablet    Sig: Take 1 tablet (30 mg total) by mouth daily.    Dispense:  90 tablet    Refill:  1    Patient Instructions  Medication Instructions:  Your physician has recommended you make the following change in your medication:   STOP: Pravachol  START: Crestor(rosuvastatin) 40 mg Take 1 tab daily START: Imdur(isosorbide) 30 mg Take 1 tab daily  *If you need a refill on your cardiac medications before your next appointment, please call your pharmacy*  Lab Work: Your physician recommends that you return for lab work in: [place] CBC,BMP  If you have labs (blood work) drawn today and your tests are completely normal, you will receive your results  only by: [name] MyChart Message (if you have MyChart) OR . A paper copy in the mail If you have any lab test that is abnormal or we need to change your treatment, we will call you to review the results.  Testing/Procedures:    [place] [address] Dept: [phone] Loc: [place]  12/23/2018  You are scheduled for a Cardiac Catheterization on Tuesday, December 22 with Dr. Daneen Schick.  1. Please arrive at the [facility] (Main Entrance A) at [facility]: [address] [place], [place] [address] at 8:30 AM (This time is two hours before your procedure to ensure your preparation). Free valet parking service is available.   Special note: Every effort is made to have your procedure done on time. Please understand that emergencies sometimes delay scheduled procedures.  2. Diet: Do not eat solid foods after midnight.  The patient may have clear liquids until 5am upon the day of  the procedure.  3. Labs: You will need to have a COVID screen. Your appointment is Friday Dec 18 at 12:40 at [facility]  4. Medication instructions in preparation for your procedure:   Contrast Allergy: No  On the morning of your procedure, take your Aspirin and any morning medicines NOT listed above.  You may use sips of water.  5. Plan for one night stay--bring personal belongings. 6. Bring a current list of your medications and current insurance cards. 7. You MUST have a responsible person to drive you home. 8. Someone MUST be with you the first 24 hours after you arrive home or your discharge will be delayed. 9. Please wear clothes that are easy to get on and off and wear slip-on shoes.  Thank you for allowing [place] to care for you!   -- [facility]  Follow-Up: At [facility], you and your health needs are our priority.   As part of our continuing mission to provide you with exceptional heart care, we have created designated Provider Care Teams.  These Care Teams include your primary Cardiologist (physician) and Advanced Practice Providers (APPs -  Physician Assistants and Nurse Practitioners) who all work together to provide you with the care you need, when you need it.  Your next appointment:   1 month(s)  The format for your next appointment:   In Person  Provider:   Berniece Salines, DO  Other Instructions      Adopting a Healthy Lifestyle.  Know what a healthy weight is for you (roughly BMI <25) and aim to maintain this   Aim for 7+ servings of fruits and vegetables daily   65-80+ fluid ounces of water or unsweet tea for healthy kidneys   Limit to max 1 drink of alcohol per day; avoid smoking/tobacco   Limit animal fats in diet for cholesterol and heart health - choose grass fed whenever available   Avoid highly processed foods, and foods high in saturated/trans fats   Aim for low stress - take time to unwind and care for your mental health   Aim for 150 min of moderate intensity exercise weekly for heart health, and weights twice weekly for bone health   Aim for 7-9 hours of sleep daily   When it comes to diets, agreement about the perfect plan isnt easy to find, even among the experts. Experts at the [employer] developed an idea known as the Healthy Eating Plate. Just imagine a plate divided into logical, healthy portions.   The emphasis is on diet quality:   Load up on vegetables and fruits - one-half of your plate: Aim for color and variety, and remember that potatoes dont count.   Go for whole grains - one-quarter of your plate: Whole wheat, barley, wheat berries, quinoa, oats, brown rice, and foods made with them. If you want pasta, go with whole wheat pasta.   Protein power - one-quarter of your plate: Fish, chicken, beans, and nuts are all healthy, versatile  protein sources. Limit red meat.   The diet, however, does go beyond the plate, offering a few other suggestions.   Use healthy plant oils, such as olive, canola, soy, corn, sunflower and peanut. Check the labels, and avoid partially hydrogenated oil, which have unhealthy trans fats.   If youre thirsty, drink water. Coffee and tea are good in moderation, but skip sugary drinks and limit milk and dairy products to one or two  daily servings.   The type of carbohydrate in the diet is more important than the amount. Some sources of carbohydrates, such as vegetables, fruits, whole grains, and beans-are healthier than others.   Finally, stay active  Signed, Berniece Salines, DO  12/23/2018 3:52 PM     [facility]

## 2018-12-23 NOTE — H&P (View-Only) (Signed)
Cardiology Office Note:    Date:  12/23/2018   ID:  [name], DOB [birth year], MRN [id number]  PCP:  [place], Sarah T, MD  Cardiologist:  Berniece Salines, DO  Electrophysiologist:  None   Referring MD: [place], Sarah T, MD   Follow-up visit.  History of Present Illness:    [name] is a 68 y.o. female with a hx of CAD, COPD, diabetes, hypertension presents today for follow-up visit.  I last saw the patient October 2020 at which time she was posthospitalization for intermittent substernal chest pain.  During that time her troponins were negative she was discharged home with cardiology follow-up.  During her office visit I was concerned for worsening coronary disease therefore I recommended the patient undergo a coronary CTA.  The patient was able to undergo her coronary CTA and is here for follow-up today.  She tells me that she has been experiencing intermittent chest pain since her last visit.  She denies any shortness of breath.   Past Medical History:  Diagnosis Date  . Asthma   . COPD (chronic obstructive pulmonary disease) ([place])   . Diastolic dysfunction   . Diverticulosis   . Fatty liver   . GERD without esophagitis   . Hx of Clostridium difficile infection   . Hx of colonic polyps   . Hx of small bowel obstruction   . Hypertension, benign   . Hypothyroidism   . Irritable bowel syndrome   . Lumbar herniated disc   . Mixed hyperlipidemia   . Osteoarthritis     Past Surgical History:  Procedure Laterality Date  . ABDOMINAL HYSTERECTOMY  2002  . APPENDECTOMY  2005  . CARPAL TUNNEL RELEASE Bilateral   . SMALL INTESTINE SURGERY  11/26/2012   Bowel obstruction, Dr. Pauletta Browns  . TUBAL LIGATION  1980    Current Medications: Current Meds  Medication Sig  . aspirin EC 81 MG tablet Take 1 tablet (81 mg total) by mouth daily.  [name] BREO ELLIPTA 100-25 MCG/INH AEPB   . calcium carbonate (TUMS - DOSED IN MG ELEMENTAL CALCIUM) 500 MG chewable tablet Chew 1 tablet by  mouth daily.  . Eluxadoline (VIBERZI) 75 MG TABS Take 75 mg by mouth 2 (two) times daily.  [name] gabapentin (NEURONTIN) 300 MG capsule 300 mg 3 (three) times daily.  [name] levothyroxine (SYNTHROID) 75 MCG tablet 75 mcg daily.  [name] lisinopril-hydrochlorothiazide (ZESTORETIC) 10-12.5 MG tablet daily.  [name] OTEZLA 30 MG TABS 30 mg daily.  . pantoprazole (PROTONIX) 40 MG tablet 40 mg daily.  [name] TREMFYA 100 MG/ML SOSY q56months  . [DISCONTINUED] pravastatin (PRAVACHOL) 80 MG tablet 80 mg daily.     Allergies:   Ether, Nickel, and Latex   Social History   Socioeconomic History  . Marital status: Divorced    Spouse name: Not on file  . Number of children: 3  . Years of education: Not on file  . Highest education level: Not on file  Occupational History  . Not on file  Tobacco Use  . Smoking status: Current Every Day Smoker    Packs/day: 1.00    Last attempt to quit: 11/2012    Years since quitting: 6.1  . Smokeless tobacco: Never Used  Substance and Sexual Activity  . Alcohol use: Not Currently  . Drug use: Not Currently  . Sexual activity: Not on file  Other Topics Concern  . Not on file  Social History Narrative  . Not on file   Social Determinants of Health  Financial Resource Strain:   . Difficulty of Paying Living Expenses: Not on file  Food Insecurity:   . Worried About Charity fundraiser in the Last Year: Not on file  . Ran Out of Food in the Last Year: Not on file  Transportation Needs:   . Lack of Transportation (Medical): Not on file  . Lack of Transportation (Non-Medical): Not on file  Physical Activity:   . Days of Exercise per Week: Not on file  . Minutes of Exercise per Session: Not on file  Stress:   . Feeling of Stress : Not on file  Social Connections:   . Frequency of Communication with Friends and Family: Not on file  . Frequency of Social Gatherings with Friends and Family: Not on file  . Attends Religious Services: Not on file  . Active Member of Clubs or  Organizations: Not on file  . Attends Archivist Meetings: Not on file  . Marital Status: Not on file     Family History: The patient's family history includes Colon cancer in her daughter; Lung cancer in her brother; Ovarian cancer in her sister; Stomach cancer in her maternal grandmother.  ROS:   Review of Systems  Constitution: Negative for decreased appetite, fever and weight gain.  HENT: Negative for congestion, ear discharge, hoarse voice and sore throat.   Eyes: Negative for discharge, redness, vision loss in right eye and visual halos.  Cardiovascular: Reports chest pain.  Negative for dyspnea on exertion, leg swelling, orthopnea and palpitations.  Respiratory: Negative for cough, hemoptysis, shortness of breath and snoring.   Endocrine: Negative for heat intolerance and polyphagia.  Hematologic/Lymphatic: Negative for bleeding problem. Does not bruise/bleed easily.  Skin: Negative for flushing, nail changes, rash and suspicious lesions.  Musculoskeletal: Negative for arthritis, joint pain, muscle cramps, myalgias, neck pain and stiffness.  Gastrointestinal: Negative for abdominal pain, bowel incontinence, diarrhea and excessive appetite.  Genitourinary: Negative for decreased libido, genital sores and incomplete emptying.  Neurological: Negative for brief paralysis, focal weakness, headaches and loss of balance.  Psychiatric/Behavioral: Negative for altered mental status, depression and suicidal ideas.  Allergic/Immunologic: Negative for HIV exposure and persistent infections.    EKGs/Labs/Other Studies Reviewed:    The following studies were reviewed today:   EKG: None today  Coronary CTA  RCA is a large dominant artery that gives rise to PDA and PLVB. The RCA is have diffuse coronary calcification: Mild (25-49%) calcified lesions in the proximal and mid portions of the RCA. There is minimal (<25%) calcified plaque.  Left main is a large artery that gives  rise to LAD and LCX arteries. There is a mild (25-49%) calcified plaque in the left main.  LAD is a large vessel with a moderate (50-69%) calcified plaque is the proximal portion. The mid portion of the LAD with a severe (>70%) tubular calcified lesion. The distal portion of the vessel tapers with multiple mild calcified plaques. D1 is a medium sized vessel with moderate (50-69%) calcified lesion at the ostium and into the proximal portion.  LCX is a non-dominant artery. There is a moderate (50-69%) calcified plaque in the proximal portion of the vessel. OM 1 branches off the proximal portion and this is a small caliber vessel.  Recent Labs: 11/30/2018: BUN 23; Creatinine, Ser 0.92; Potassium 4.1; Sodium 140  Recent Lipid Panel    Component Value Date/Time   CHOL  01/20/2007 0505    96        ATP III CLASSIFICATION:  <  200     mg/dL   Desirable  200-239  mg/dL   Borderline High  >=240    mg/dL   High          TRIG 159 (H) 01/20/2007 0505   HDL 28 (L) 01/20/2007 0505   CHOLHDL 3.4 01/20/2007 0505   VLDL 32 01/20/2007 0505   LDLCALC  01/20/2007 0505    36        Total Cholesterol/HDL:CHD Risk Coronary Heart Disease Risk Table                     Men   Women  1/2 Average Risk   3.4   3.3  Average Risk       5.0   4.4  2 X Average Risk   9.6   7.1  3 X Average Risk  23.4   11.0        Use the calculated Patient Ratio above and the CHD Risk Table to determine the patient's CHD Risk.        ATP III CLASSIFICATION (LDL):  <100     mg/dL   Optimal  100-129  mg/dL   Near or Above                    Optimal  130-159  mg/dL   Borderline  160-189  mg/dL   High  >190     mg/dL   Very High    Physical Exam:    VS:  BP 128/62 (BP Location: Right Arm, Patient Position: Sitting, Cuff Size: Normal)   Pulse 60   Ht 5\' 4"  (1.626 m)   Wt 188 lb (85.3 kg)   SpO2 93%   BMI 32.27 kg/m     Wt Readings from Last 3 Encounters:  12/23/18 188 lb (85.3 kg)  11/01/18 185 lb 4.8 oz  (84.1 kg)  10/19/18 183 lb (83 kg)     GEN: Well nourished, well developed in no acute distress HEENT: Normal NECK: No JVD; No carotid bruits LYMPHATICS: No lymphadenopathy CARDIAC: S1S2 noted,RRR, no murmurs, rubs, gallops RESPIRATORY:  Clear to auscultation without rales, wheezing or rhonchi  ABDOMEN: Soft, non-tender, non-distended, +bowel sounds, no guarding. EXTREMITIES: No edema, No cyanosis, no clubbing MUSCULOSKELETAL:  No edema; No deformity  SKIN: Warm and dry NEUROLOGIC:  Alert and oriented x 3, non-focal PSYCHIATRIC:  Normal affect, good insight  ASSESSMENT:    1. Coronary artery disease of native artery of native heart with stable angina pectoris ([place])   2. Mixed hyperlipidemia   3. Essential hypertension   4. Tobacco use   5. Type 2 diabetes mellitus with complication, without long-term current use of insulin ([place])   6. Pre-procedure lab exam    PLAN:     Her coronary CTA does show evidence of moderate to severe diffuse coronary artery disease.  With her recurrent intermittent chest pain, I think at this time a left heart cath will be appropriate.  I have educated patient about this procedure, she has had this procedure in the past and does not have any further questions.  She understands that risks include but are not limited to stroke (1 in 1000), death (1 in 70), kidney failure [usually temporary] (1 in 500), bleeding (1 in 200), allergic reaction [possibly serious] (1 in 200), and agrees to proceed.  In the meantime she will continue aspirin 81 mg daily, I have stopped her pravastatin for more potent Crestor 40 mg daily.  Imdur  30 mg daily has been placed for her anginal symptoms.  She continues to smoke.  The patient was counseled on tobacco cessation today for 5 minutes.  Counseling included reviewing the risks of smoking tobacco products, how it impacts the patient's current medical diagnoses and different strategies for quitting.  Pharmacotherapy to aid in  tobacco cessation was not prescribed today. The patient coordinate with  primary care provider.  The patient was also advised to call  1-800-QUIT-NOW 606-818-7744) for additional help with quitting smoking.  Hyperlipidemia-Pravachol has been stopped she will be started on Crestor 40 mg daily.  Diabetes- Per primary provider.  The patient is in agreement with the above plan. The patient left the office in stable condition.  The patient will follow up in 1 month or sooner if needed.   Medication Adjustments/Labs and Tests Ordered: Current medicines are reviewed at length with the patient today.  Concerns regarding medicines are outlined above.  Orders Placed This Encounter  Procedures  . Basic Metabolic Panel (BMET)  . CBC   Meds ordered this encounter  Medications  . DISCONTD: rosuvastatin (CRESTOR) 40 MG tablet    Sig: Take 1 tablet (40 mg total) by mouth daily.    Dispense:  90 tablet    Refill:  1  . DISCONTD: isosorbide mononitrate (IMDUR) 30 MG 24 hr tablet    Sig: Take 1 tablet (30 mg total) by mouth daily.    Dispense:  90 tablet    Refill:  1  . rosuvastatin (CRESTOR) 40 MG tablet    Sig: Take 1 tablet (40 mg total) by mouth daily.    Dispense:  90 tablet    Refill:  1  . isosorbide mononitrate (IMDUR) 30 MG 24 hr tablet    Sig: Take 1 tablet (30 mg total) by mouth daily.    Dispense:  90 tablet    Refill:  1    Patient Instructions  Medication Instructions:  Your physician has recommended you make the following change in your medication:   STOP: Pravachol  START: Crestor(rosuvastatin) 40 mg Take 1 tab daily START: Imdur(isosorbide) 30 mg Take 1 tab daily  *If you need a refill on your cardiac medications before your next appointment, please call your pharmacy*  Lab Work: Your physician recommends that you return for lab work in: [facility] CBC,BMP  If you have labs (blood work) drawn today and your tests are completely normal, you will receive your results  only by: [name] MyChart Message (if you have MyChart) OR . A paper copy in the mail If you have any lab test that is abnormal or we need to change your treatment, we will call you to review the results.  Testing/Procedures:    [place] [address] Dept: [phone] Loc: [place]  12/23/2018  You are scheduled for a Cardiac Catheterization on Tuesday, December 22 with Dr. Daneen Schick.  1. Please arrive at the [facility] (Main Entrance A) at [facility]: [address] [place], [place] [address] at 8:30 AM (This time is two hours before your procedure to ensure your preparation). Free valet parking service is available.   Special note: Every effort is made to have your procedure done on time. Please understand that emergencies sometimes delay scheduled procedures.  2. Diet: Do not eat solid foods after midnight.  The patient may have clear liquids until 5am upon the day of  the procedure.  3. Labs: You will need to have a COVID screen. Your appointment is Friday Dec 18 at 12:40 at [facility]  4. Medication instructions in preparation for your procedure:   Contrast Allergy: No  On the morning of your procedure, take your Aspirin and any morning medicines NOT listed above.  You may use sips of water.  5. Plan for one night stay--bring personal belongings. 6. Bring a current list of your medications and current insurance cards. 7. You MUST have a responsible person to drive you home. 8. Someone MUST be with you the first 24 hours after you arrive home or your discharge will be delayed. 9. Please wear clothes that are easy to get on and off and wear slip-on shoes.  Thank you for allowing [place] to care for you!   -- [facility]  Follow-Up: At [facility], you and your health needs are our priority.   As part of our continuing mission to provide you with exceptional heart care, we have created designated Provider Care Teams.  These Care Teams include your primary Cardiologist (physician) and Advanced Practice Providers (APPs -  Physician Assistants and Nurse Practitioners) who all work together to provide you with the care you need, when you need it.  Your next appointment:   1 month(s)  The format for your next appointment:   In Person  Provider:   Berniece Salines, DO  Other Instructions      Adopting a Healthy Lifestyle.  Know what a healthy weight is for you (roughly BMI <25) and aim to maintain this   Aim for 7+ servings of fruits and vegetables daily   65-80+ fluid ounces of water or unsweet tea for healthy kidneys   Limit to max 1 drink of alcohol per day; avoid smoking/tobacco   Limit animal fats in diet for cholesterol and heart health - choose grass fed whenever available   Avoid highly processed foods, and foods high in saturated/trans fats   Aim for low stress - take time to unwind and care for your mental health   Aim for 150 min of moderate intensity exercise weekly for heart health, and weights twice weekly for bone health   Aim for 7-9 hours of sleep daily   When it comes to diets, agreement about the perfect plan isnt easy to find, even among the experts. Experts at the [employer] developed an idea known as the Healthy Eating Plate. Just imagine a plate divided into logical, healthy portions.   The emphasis is on diet quality:   Load up on vegetables and fruits - one-half of your plate: Aim for color and variety, and remember that potatoes dont count.   Go for whole grains - one-quarter of your plate: Whole wheat, barley, wheat berries, quinoa, oats, brown rice, and foods made with them. If you want pasta, go with whole wheat pasta.   Protein power - one-quarter of your plate: Fish, chicken, beans, and nuts are all healthy, versatile  protein sources. Limit red meat.   The diet, however, does go beyond the plate, offering a few other suggestions.   Use healthy plant oils, such as olive, canola, soy, corn, sunflower and peanut. Check the labels, and avoid partially hydrogenated oil, which have unhealthy trans fats.   If youre thirsty, drink water. Coffee and tea are good in moderation, but skip sugary drinks and limit milk and dairy products to one or two  daily servings.   The type of carbohydrate in the diet is more important than the amount. Some sources of carbohydrates, such as vegetables, fruits, whole grains, and beans-are healthier than others.   Finally, stay active  Signed, Berniece Salines, DO  12/23/2018 3:52 PM    [facility]

## 2018-12-24 ENCOUNTER — Other Ambulatory Visit (HOSPITAL_COMMUNITY)
Admission: RE | Admit: 2018-12-24 | Discharge: 2018-12-24 | Disposition: A | Discharge status: Home / Self Care | Payer: Medicare Other | Source: Ambulatory Visit | Attending: Interventional Cardiology | Admitting: Interventional Cardiology

## 2018-12-24 DIAGNOSIS — Z01812 Encounter for preprocedural laboratory examination: Secondary | ICD-10-CM | POA: Insufficient documentation

## 2018-12-24 DIAGNOSIS — Z20828 Contact with and (suspected) exposure to other viral communicable diseases: Secondary | ICD-10-CM | POA: Diagnosis not present

## 2018-12-24 LAB — BASIC METABOLIC PANEL
BUN/Creatinine Ratio: 20 (ref 12–28)
BUN: 21 mg/dL (ref 8–27)
CO2: 26 mmol/L (ref 20–29)
Calcium: 9.7 mg/dL (ref 8.7–10.3)
Chloride: 98 mmol/L (ref 96–106)
Creatinine, Ser: 1.05 mg/dL — ABNORMAL HIGH (ref 0.57–1.00)
GFR calc Af Amer: 63 mL/min/{1.73_m2} (ref >59)
GFR calc non Af Amer: 54 mL/min/{1.73_m2} — ABNORMAL LOW (ref >59)
Glucose: 99 mg/dL (ref 65–99)
Potassium: 4.9 mmol/L (ref 3.5–5.2)
Sodium: 138 mmol/L (ref 134–144)

## 2018-12-24 LAB — CBC
Hematocrit: 40.8 % (ref 34.0–46.6)
Hemoglobin: 14 g/dL (ref 11.1–15.9)
MCH: 30.4 pg (ref 26.6–33.0)
MCHC: 34.3 g/dL (ref 31.5–35.7)
MCV: 89 fL (ref 79–97)
Platelets: 245 10*3/uL (ref 150–450)
RBC: 4.61 x10E6/uL (ref 3.77–5.28)
RDW: 12.5 % (ref 11.7–15.4)
WBC: 11 10*3/uL — ABNORMAL HIGH (ref 3.4–10.8)

## 2018-12-25 LAB — NOVEL CORONAVIRUS, NAA (HOSP ORDER, SEND-OUT TO REF LAB; TAT 18-24 HRS): SARS-CoV-2, NAA: NOT DETECTED

## 2018-12-27 ENCOUNTER — Telehealth: Payer: Self-pay | Admitting: *Deleted

## 2018-12-27 NOTE — Telephone Encounter (Addendum)
Pt contacted pre-catheterization scheduled at [facility] for: Tuesday December 28, 2018 10:30 AM Verified arrival time and place: [facility]) at: 8:30 AM   No solid food after midnight prior to cath, clear liquids until 5 AM day of procedure. Contrast allergy: no  Hold: Lisinopril-HCT-day before and day  of procedure-GFR 54-pt had already taken today  Except hold medications AM meds can be  taken pre-cath with sip of water including: ASA 81 mg   Confirmed patient has responsible adult to drive home post procedure and observe 24 hours after arriving home: yes  Currently, due to Covid-19 pandemic, only one support person will be allowed with patient. Must be the same support person for that patient's entire stay, will be screened and required to wear a mask. They will be asked to wait in the waiting room for the duration of the patient's stay.  Patients are required to wear a mask when they enter the hospital.     COVID-19 Pre-Screening Questions:  . In the past 7 to 10 days have you had a cough,  shortness of breath, headache, congestion, fever (100 or greater) body aches, chills, sore throat, or sudden loss of taste or sense of smell? no . Have you been around anyone with known Covid 19? no . Have you been around anyone who is awaiting Covid 19 test results in the past 7 to 10 days? no . Have you been around anyone who has been exposed to Covid 19, or has mentioned symptoms of Covid 19 within the past 7 to 10 days? no    I reviewed procedure/mask/visitor instructions, Covid-19 screening questions with patient, she verbalized understanding, thanked me for call.

## 2018-12-27 NOTE — Telephone Encounter (Signed)
I attempted to contact patient to review procedure instructions, call rings, then disconnects.

## 2018-12-28 ENCOUNTER — Ambulatory Visit (HOSPITAL_COMMUNITY)
Admission: RE | Admit: 2018-12-28 | Discharge: 2018-12-28 | Disposition: A | Discharge status: Home / Self Care | Payer: Medicare Other | Attending: Interventional Cardiology | Admitting: Interventional Cardiology

## 2018-12-28 ENCOUNTER — Other Ambulatory Visit: Payer: Self-pay

## 2018-12-28 ENCOUNTER — Encounter (HOSPITAL_COMMUNITY)
Admission: RE | Disposition: A | Discharge status: Home / Self Care | Payer: Medicare Other | Source: Home / Self Care | Attending: Interventional Cardiology

## 2018-12-28 DIAGNOSIS — J449 Chronic obstructive pulmonary disease, unspecified: Secondary | ICD-10-CM | POA: Diagnosis not present

## 2018-12-28 DIAGNOSIS — E039 Hypothyroidism, unspecified: Secondary | ICD-10-CM | POA: Diagnosis not present

## 2018-12-28 DIAGNOSIS — Z79890 Hormone replacement therapy (postmenopausal): Secondary | ICD-10-CM | POA: Diagnosis not present

## 2018-12-28 DIAGNOSIS — K219 Gastro-esophageal reflux disease without esophagitis: Secondary | ICD-10-CM | POA: Insufficient documentation

## 2018-12-28 DIAGNOSIS — I25118 Atherosclerotic heart disease of native coronary artery with other forms of angina pectoris: Secondary | ICD-10-CM | POA: Diagnosis present

## 2018-12-28 DIAGNOSIS — K589 Irritable bowel syndrome without diarrhea: Secondary | ICD-10-CM | POA: Diagnosis not present

## 2018-12-28 DIAGNOSIS — Z7982 Long term (current) use of aspirin: Secondary | ICD-10-CM | POA: Insufficient documentation

## 2018-12-28 DIAGNOSIS — I10 Essential (primary) hypertension: Secondary | ICD-10-CM | POA: Diagnosis not present

## 2018-12-28 DIAGNOSIS — E119 Type 2 diabetes mellitus without complications: Secondary | ICD-10-CM | POA: Diagnosis not present

## 2018-12-28 DIAGNOSIS — E782 Mixed hyperlipidemia: Secondary | ICD-10-CM | POA: Diagnosis present

## 2018-12-28 DIAGNOSIS — Z79899 Other long term (current) drug therapy: Secondary | ICD-10-CM | POA: Insufficient documentation

## 2018-12-28 DIAGNOSIS — E118 Type 2 diabetes mellitus with unspecified complications: Secondary | ICD-10-CM | POA: Diagnosis present

## 2018-12-28 DIAGNOSIS — M1990 Unspecified osteoarthritis, unspecified site: Secondary | ICD-10-CM | POA: Diagnosis present

## 2018-12-28 DIAGNOSIS — F17210 Nicotine dependence, cigarettes, uncomplicated: Secondary | ICD-10-CM | POA: Diagnosis not present

## 2018-12-28 HISTORY — PX: LEFT HEART CATH AND CORONARY ANGIOGRAPHY: CATH118249

## 2018-12-28 SURGERY — LEFT HEART CATH AND CORONARY ANGIOGRAPHY
Anesthesia: LOCAL

## 2018-12-28 MED ORDER — SODIUM CHLORIDE 0.9% FLUSH: 3.0000 mL | INTRAVENOUS | Status: DC | PRN

## 2018-12-28 MED ORDER — ACETAMINOPHEN 325 MG PO TABS: 650.0000 mg | ORAL_TABLET | ORAL | Status: DC | PRN

## 2018-12-28 MED ORDER — SODIUM CHLORIDE 0.9 % IV SOLN: INTRAVENOUS | Status: DC

## 2018-12-28 MED ORDER — SODIUM CHLORIDE 0.9 % WEIGHT BASED INFUSION: 1.0000 mL/kg/h | INTRAVENOUS | Status: DC

## 2018-12-28 MED ORDER — ASPIRIN 81 MG PO CHEW: 81.0000 mg | CHEWABLE_TABLET | ORAL | Status: DC

## 2018-12-28 MED ORDER — FENTANYL CITRATE (PF) 100 MCG/2ML IJ SOLN
INTRAMUSCULAR | Status: DC | PRN
  Administered 2018-12-28: 25 ug via INTRAVENOUS

## 2018-12-28 MED ORDER — LIDOCAINE HCL (PF) 1 % IJ SOLN
INTRAMUSCULAR | Status: AC
  Filled 2018-12-28: qty 30

## 2018-12-28 MED ORDER — VERAPAMIL HCL 2.5 MG/ML IV SOLN
INTRAVENOUS | Status: AC
  Filled 2018-12-28: qty 2

## 2018-12-28 MED ORDER — SODIUM CHLORIDE 0.9 % IV SOLN: 250.0000 mL | INTRAVENOUS | Status: DC | PRN

## 2018-12-28 MED ORDER — ONDANSETRON HCL 4 MG/2ML IJ SOLN: 4.0000 mg | Freq: Four times a day (QID) | INTRAMUSCULAR | Status: DC | PRN

## 2018-12-28 MED ORDER — HEPARIN (PORCINE) IN NACL 1000-0.9 UT/500ML-% IV SOLN
INTRAVENOUS | Status: AC
  Filled 2018-12-28: qty 1000

## 2018-12-28 MED ORDER — HEPARIN SODIUM (PORCINE) 1000 UNIT/ML IJ SOLN
INTRAMUSCULAR | Status: DC | PRN
  Administered 2018-12-28: 4500 [IU] via INTRAVENOUS

## 2018-12-28 MED ORDER — HEPARIN (PORCINE) IN NACL 1000-0.9 UT/500ML-% IV SOLN
INTRAVENOUS | Status: DC | PRN
  Administered 2018-12-28 (×2): 500 mL

## 2018-12-28 MED ORDER — CLOPIDOGREL BISULFATE 75 MG PO TABS: 75.0000 mg | ORAL_TABLET | Freq: Every day | ORAL | Status: DC

## 2018-12-28 MED ORDER — SODIUM CHLORIDE 0.9% FLUSH: 3.0000 mL | Freq: Two times a day (BID) | INTRAVENOUS | Status: DC

## 2018-12-28 MED ORDER — VERAPAMIL HCL 2.5 MG/ML IV SOLN
INTRAVENOUS | Status: DC | PRN
  Administered 2018-12-28: 11:00:00 10 mL via INTRA_ARTERIAL

## 2018-12-28 MED ORDER — FENTANYL CITRATE (PF) 100 MCG/2ML IJ SOLN
INTRAMUSCULAR | Status: AC
  Filled 2018-12-28: qty 2

## 2018-12-28 MED ORDER — ASPIRIN 81 MG PO CHEW: 81.0000 mg | CHEWABLE_TABLET | Freq: Every day | ORAL | Status: DC

## 2018-12-28 MED ORDER — HEPARIN SODIUM (PORCINE) 1000 UNIT/ML IJ SOLN
INTRAMUSCULAR | Status: AC
  Filled 2018-12-28: qty 1

## 2018-12-28 MED ORDER — LABETALOL HCL 5 MG/ML IV SOLN: 10.0000 mg | INTRAVENOUS | Status: DC | PRN

## 2018-12-28 MED ORDER — SODIUM CHLORIDE 0.9 % WEIGHT BASED INFUSION
3.0000 mL/kg/h | INTRAVENOUS | Status: AC
  Administered 2018-12-28: 3 mL/kg/h via INTRAVENOUS

## 2018-12-28 MED ORDER — MIDAZOLAM HCL 2 MG/2ML IJ SOLN
INTRAMUSCULAR | Status: DC | PRN
  Administered 2018-12-28: 1 mg via INTRAVENOUS

## 2018-12-28 MED ORDER — LIDOCAINE HCL (PF) 1 % IJ SOLN
INTRAMUSCULAR | Status: DC | PRN
  Administered 2018-12-28: 2 mL via SUBCUTANEOUS

## 2018-12-28 MED ORDER — HYDRALAZINE HCL 20 MG/ML IJ SOLN: 10.0000 mg | INTRAMUSCULAR | Status: DC | PRN

## 2018-12-28 MED ORDER — MIDAZOLAM HCL 2 MG/2ML IJ SOLN
INTRAMUSCULAR | Status: AC
  Filled 2018-12-28: qty 2

## 2018-12-28 SURGICAL SUPPLY — 11 items
CATH 5FR JL3.5 JR4 ANG PIG MP (CATHETERS) ×1 IMPLANT
DEVICE RAD COMP TR BAND LRG (VASCULAR PRODUCTS) ×1 IMPLANT
GLIDESHEATH SLEND A-KIT 6F 22G (SHEATH) ×1 IMPLANT
GUIDEWIRE INQWIRE 1.5J.035X260 (WIRE) IMPLANT
INQWIRE 1.5J .035X260CM (WIRE) ×2
KIT HEART LEFT (KITS) ×2 IMPLANT
PACK CARDIAC CATHETERIZATION (CUSTOM PROCEDURE TRAY) ×2 IMPLANT
SHEATH PROBE COVER 6X72 (BAG) ×1 IMPLANT
TRANSDUCER W/STOPCOCK (MISCELLANEOUS) ×2 IMPLANT
TUBING CIL FLEX 10 FLL-RA (TUBING) ×2 IMPLANT
WIRE HI TORQ VERSACORE-J 145CM (WIRE) ×1 IMPLANT

## 2018-12-28 NOTE — Interval H&P Note (Signed)
Cath Lab Visit (complete for each Cath Lab visit)  Clinical Evaluation Leading to the Procedure:   ACS: Yes.    Non-ACS:    Anginal Classification: CCS II  Anti-ischemic medical therapy: Minimal Therapy (1 class of medications)  Non-Invasive Test Results: Intermediate-risk stress test findings: cardiac mortality 1-3%/year  Prior CABG: No previous CABG      History and Physical Interval Note:  12/28/2018 10:38 AM  [name]  has presented today for surgery, with the diagnosis of Positive Cardiac CT.  The various methods of treatment have been discussed with the patient and family. After consideration of risks, benefits and other options for treatment, the patient has consented to  Procedure(s): LEFT HEART CATH AND CORONARY ANGIOGRAPHY (N/A) as a surgical intervention.  The patient's history has been reviewed, patient examined, no change in status, stable for surgery.  I have reviewed the patient's chart and labs.  Questions were answered to the patient's satisfaction.     Belva Crome III

## 2018-12-28 NOTE — CV Procedure (Signed)
   Coronary angiography via right radial approach using real-time vascular ultrasound for guidance.  Large very tortuous vessels.  Right dominant coronary anatomy.  Complex eccentric proximal LAD 50% stenosis bifurcation stenosis with the first diagonal.  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  Circumflex is widely patent.  LV function is normal.  EDP is 12 mmHg.

## 2018-12-28 NOTE — Discharge Instructions (Signed)
Radial Site Care ° °This sheet gives you information about how to care for yourself after your procedure. Your health care provider may also give you more specific instructions. If you have problems or questions, contact your health care provider. °What can I expect after the procedure? °After the procedure, it is common to have: °· Bruising and tenderness at the catheter insertion area. °Follow these instructions at home: °Medicines °· Take over-the-counter and prescription medicines only as told by your health care provider. °Insertion site care °· Follow instructions from your health care provider about how to take care of your insertion site. Make sure you: °? Wash your hands with soap and water before you change your bandage (dressing). If soap and water are not available, use hand sanitizer. °? Change your dressing as told by your health care provider. °? Leave stitches (sutures), skin glue, or adhesive strips in place. These skin closures may need to stay in place for 2 weeks or longer. If adhesive strip edges start to loosen and curl up, you may trim the loose edges. Do not remove adhesive strips completely unless your health care provider tells you to do that. °· Check your insertion site every day for signs of infection. Check for: °? Redness, swelling, or pain. °? Fluid or blood. °? Pus or a bad smell. °? Warmth. °· Do not take baths, swim, or use a hot tub until your health care provider approves. °· You may shower 24-48 hours after the procedure, or as directed by your health care provider. °? Remove the dressing and gently wash the site with plain soap and water. °? Pat the area dry with a clean towel. °? Do not rub the site. That could cause bleeding. °· Do not apply powder or lotion to the site. °Activity ° °· For 24 hours after the procedure, or as directed by your health care provider: °? Do not flex or bend the affected arm. °? Do not push or pull heavy objects with the affected arm. °? Do not  drive yourself home from the hospital or clinic. You may drive 24 hours after the procedure unless your health care provider tells you not to. °? Do not operate machinery or power tools. °· Do not lift anything that is heavier than 10 lb (4.5 kg), or the limit that you are told, until your health care provider says that it is safe. °· Ask your health care provider when it is okay to: °? Return to work or school. °? Resume usual physical activities or sports. °? Resume sexual activity. °General instructions °· If the catheter site starts to bleed, raise your arm and put firm pressure on the site. If the bleeding does not stop, get help right away. This is a medical emergency. °· If you went home on the same day as your procedure, a responsible adult should be with you for the first 24 hours after you arrive home. °· Keep all follow-up visits as told by your health care provider. This is important. °Contact a health care provider if: °· You have a fever. °· You have redness, swelling, or yellow drainage around your insertion site. °Get help right away if: °· You have unusual pain at the radial site. °· The catheter insertion area swells very fast. °· The insertion area is bleeding, and the bleeding does not stop when you hold steady pressure on the area. °· Your arm or hand becomes pale, cool, tingly, or numb. °These symptoms may represent a serious problem   that is an emergency. Do not wait to see if the symptoms will go away. Get medical help right away. Call your local emergency services (911 in the [place] Do not drive yourself to the hospital. °Summary °· After the procedure, it is common to have bruising and tenderness at the site. °· Follow instructions from your health care provider about how to take care of your radial site wound. Check the wound every day for signs of infection. °· Do not lift anything that is heavier than 10 lb (4.5 kg), or the limit that you are told, until your health care provider says  that it is safe. °This information is not intended to replace advice given to you by your health care provider. Make sure you discuss any questions you have with your health care provider. °Document Released: 01/25/2010 Document Revised: 01/28/2017 Document Reviewed: 01/28/2017 °Elsevier Patient Education © 2020 [employer] ° °

## 2019-01-24 ENCOUNTER — Other Ambulatory Visit: Payer: Self-pay

## 2019-01-24 ENCOUNTER — Ambulatory Visit (INDEPENDENT_AMBULATORY_CARE_PROVIDER_SITE_OTHER): Payer: Medicare Other | Admitting: Cardiology

## 2019-01-24 ENCOUNTER — Encounter: Payer: Self-pay | Admitting: Cardiology

## 2019-01-24 VITALS — BP 110/54 | HR 88 | Ht 64.0 in | Wt 186.0 lb

## 2019-01-24 DIAGNOSIS — I10 Essential (primary) hypertension: Secondary | ICD-10-CM | POA: Diagnosis not present

## 2019-01-24 DIAGNOSIS — I2510 Atherosclerotic heart disease of native coronary artery without angina pectoris: Secondary | ICD-10-CM

## 2019-01-24 DIAGNOSIS — E118 Type 2 diabetes mellitus with unspecified complications: Secondary | ICD-10-CM | POA: Diagnosis not present

## 2019-01-24 DIAGNOSIS — E782 Mixed hyperlipidemia: Secondary | ICD-10-CM

## 2019-01-24 DIAGNOSIS — Z720 Tobacco use: Secondary | ICD-10-CM

## 2019-01-24 NOTE — Progress Notes (Addendum)
Cardiology Office Note:    Date:  01/24/2019   ID:  [name], DOB [birth year], MRN [id number]  PCP:  [place], Sarah T, MD  Cardiologist:  Berniece Salines, DO  Electrophysiologist:  None   Referring MD: [place]ique, Sarah T, MD   Chief Complaint  Patient presents with  . Follow-up    1 month    History of Present Illness:    [name] a 69 y.o.femalewith a hx of CAD, COPD, diabetes, hypertension presents today for follow-up visit.  I saw the patient October 2020 at which time she was posthospitalization for intermittent substernal chest pain.  During that time her troponins were negative she was discharged home with cardiology follow-up.  During her office visit I was concerned for worsening coronary disease therefore I recommended the patient undergo a coronary CTA.  I saw the patient on December 17,2020 giving her results from her coronary CT I recommended that she undergo left heart catheterization.  She did undergo a left heart catheterization with no indication of stenting.  She is here for follow-up visit today.  She reports that she has been experiencing coughing which is now become productive.  She tells me that it looks grayish.  She denies any fevers, chills any nausea or vomiting.  Past Medical History:  Diagnosis Date  . Asthma   . COPD (chronic obstructive pulmonary disease) ([place])   . Diastolic dysfunction   . Diverticulosis   . Fatty liver   . GERD without esophagitis   . Hx of Clostridium difficile infection   . Hx of colonic polyps   . Hx of small bowel obstruction   . Hypertension, benign   . Hypothyroidism   . Irritable bowel syndrome   . Lumbar herniated disc   . Mixed hyperlipidemia   . Osteoarthritis     Past Surgical History:  Procedure Laterality Date  . ABDOMINAL HYSTERECTOMY  2002  . APPENDECTOMY  2005  . CARPAL TUNNEL RELEASE Bilateral   . LEFT HEART CATH AND CORONARY ANGIOGRAPHY N/A 12/28/2018   Procedure: LEFT HEART CATH AND CORONARY  ANGIOGRAPHY;  Surgeon: Belva Crome, MD;  Location: [facility];  Service: Cardiovascular;  Laterality: N/A;  . SMALL INTESTINE SURGERY  11/26/2012   Bowel obstruction, Dr. Pauletta Browns  . TUBAL LIGATION  1980    Current Medications: Current Meds  Medication Sig  . aspirin EC 81 MG tablet Take 1 tablet (81 mg total) by mouth daily.  [name] BREO ELLIPTA 100-25 MCG/INH AEPB Inhale 1 puff into the lungs every evening.   . calcium carbonate (TUMS - DOSED IN MG ELEMENTAL CALCIUM) 500 MG chewable tablet Chew 1 tablet by mouth daily.  . Eluxadoline (VIBERZI) 75 MG TABS Take 75 mg by mouth 2 (two) times daily as needed (IBS).   [name] EPINEPHrine 0.3 mg/0.3 mL IJ SOAJ injection Inject 0.3 mg into the muscle as needed for anaphylaxis.  . fluticasone (FLONASE) 50 MCG/ACT nasal spray Place 1 spray into both nostrils 2 (two) times daily.  [name] gabapentin (NEURONTIN) 300 MG capsule Take 300-600 mg by mouth See admin instructions. Take 1 capsule (300 mg) by mouth in the morning & take 2 capsules (600 mg) by mouth in the evening.  . isosorbide mononitrate (IMDUR) 30 MG 24 hr tablet Take 1 tablet (30 mg total) by mouth daily.  [name] levothyroxine (SYNTHROID) 75 MCG tablet Take 75 mcg by mouth daily before breakfast.   . lisinopril-hydrochlorothiazide (ZESTORETIC) 10-12.5 MG tablet Take 1 tablet by mouth  daily.   . pantoprazole (PROTONIX) 40 MG tablet Take 40 mg by mouth daily.   . rifaximin (XIFAXAN) 550 MG TABS tablet Take 550 mg by mouth See admin instructions. Take 1 tablet (550 mg) by mouth twice daily for 2 weeks, hold for 4 months then resume cycle  . rosuvastatin (CRESTOR) 40 MG tablet Take 1 tablet (40 mg total) by mouth daily.  [name] TREMFYA 100 MG/ML SOSY Inject 100 mg into the skin See admin instructions. Every 2 months     Allergies:   Ether, Nickel, and Latex   Social History   Socioeconomic History  . Marital status: Divorced    Spouse name: Not on file  . Number of children: 3  . Years of education:  Not on file  . Highest education level: Not on file  Occupational History  . Not on file  Tobacco Use  . Smoking status: Current Every Day Smoker    Packs/day: 1.00    Last attempt to quit: 11/2012    Years since quitting: 6.2  . Smokeless tobacco: Never Used  Substance and Sexual Activity  . Alcohol use: Not Currently  . Drug use: Not Currently  . Sexual activity: Not on file  Other Topics Concern  . Not on file  Social History Narrative  . Not on file   Social Determinants of Health   Financial Resource Strain:   . Difficulty of Paying Living Expenses: Not on file  Food Insecurity:   . Worried About Charity fundraiser in the Last Year: Not on file  . Ran Out of Food in the Last Year: Not on file  Transportation Needs:   . Lack of Transportation (Medical): Not on file  . Lack of Transportation (Non-Medical): Not on file  Physical Activity:   . Days of Exercise per Week: Not on file  . Minutes of Exercise per Session: Not on file  Stress:   . Feeling of Stress : Not on file  Social Connections:   . Frequency of Communication with Friends and Family: Not on file  . Frequency of Social Gatherings with Friends and Family: Not on file  . Attends Religious Services: Not on file  . Active Member of Clubs or Organizations: Not on file  . Attends Archivist Meetings: Not on file  . Marital Status: Not on file     Family History: The patient's family history includes Colon cancer in her daughter; Lung cancer in her brother; Ovarian cancer in her sister; Stomach cancer in her maternal grandmother.  ROS:   Review of Systems  Constitution: Negative for decreased appetite, fever and weight gain.  HENT: Negative for congestion, ear discharge, hoarse voice and sore throat.   Eyes: Negative for discharge, redness, vision loss in right eye and visual halos.  Cardiovascular: Negative for chest pain, dyspnea on exertion, leg swelling, orthopnea and palpitations.    Respiratory: Negative for cough, hemoptysis, shortness of breath and snoring.   Endocrine: Negative for heat intolerance and polyphagia.  Hematologic/Lymphatic: Negative for bleeding problem. Does not bruise/bleed easily.  Skin: Negative for flushing, nail changes, rash and suspicious lesions.  Musculoskeletal: Negative for arthritis, joint pain, muscle cramps, myalgias, neck pain and stiffness.  Gastrointestinal: Negative for abdominal pain, bowel incontinence, diarrhea and excessive appetite.  Genitourinary: Negative for decreased libido, genital sores and incomplete emptying.  Neurological: Negative for brief paralysis, focal weakness, headaches and loss of balance.  Psychiatric/Behavioral: Negative for altered mental status, depression and suicidal ideas.  Allergic/Immunologic:  Negative for HIV exposure and persistent infections.    EKGs/Labs/Other Studies Reviewed:    The following studies were reviewed today:   EKG: None today  Left heart catheterization December 23, 2018:  Mid LM to Dist LM lesion is 30% stenosed.    Mild generalized diffuse disease in all 3 coronaries.  Right dominant coronary anatomy.  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  The first diagonal also contains 50% stenosis.  Widely patent circumflex coronary artery.  Widely patent right coronary artery.  Normal left ventricular function.  LVEF 65%.  Normal LVEDP.  RECOMMENDATIONS:   Aggressive risk factor modification  Consider using Plavix instead of 4 in combination with aspirin to minimize the risk of saccular aneurysm thrombus that could cause distal embolization.  Close clinical follow-up  Recent Labs: 12/23/2018: BUN 21; Creatinine, Ser 1.05; Hemoglobin 14.0; Platelets 245; Potassium 4.9; Sodium 138  Recent Lipid Panel    Component Value Date/Time    CHOL  01/20/2007 0505    96        ATP III CLASSIFICATION:  <200     mg/dL   Desirable  200-239  mg/dL   Borderline High  >=240    mg/dL   High          TRIG 159 (H) 01/20/2007 0505   HDL 28 (L) 01/20/2007 0505   CHOLHDL 3.4 01/20/2007 0505   VLDL 32 01/20/2007 0505   LDLCALC  01/20/2007 0505    36        Total Cholesterol/HDL:CHD Risk Coronary Heart Disease Risk Table                     Men   Women  1/2 Average Risk   3.4   3.3  Average Risk       5.0   4.4  2 X Average Risk   9.6   7.1  3 X Average Risk  23.4   11.0        Use the calculated Patient Ratio above and the CHD Risk Table to determine the patient's CHD Risk.        ATP III CLASSIFICATION (LDL):  <100     mg/dL   Optimal  100-129  mg/dL   Near or Above                    Optimal  130-159  mg/dL   Borderline  160-189  mg/dL   High  >190     mg/dL   Very High    Physical Exam:    VS:  BP (!) 110/54   Pulse 88   Ht 5\' 4"  (1.626 m)   Wt 186 lb (84.4 kg)   SpO2 96%   BMI 31.93 kg/m     Wt Readings from Last 3 Encounters:  01/24/19 186 lb (84.4 kg)  12/28/18 188 lb (85.3 kg)  12/23/18 188 lb (85.3 kg)     GEN: Well nourished, well developed in no acute distress HEENT: Normal NECK: No JVD; No carotid bruits LYMPHATICS: No lymphadenopathy CARDIAC: S1S2 noted,RRR, 2 out of 6 systolic murmurs, rubs, gallops RESPIRATORY:  Clear to auscultation without rales, wheezing or rhonchi  ABDOMEN: Soft, non-tender, non-distended, +bowel sounds, no guarding. EXTREMITIES: No edema, No cyanosis, no clubbing MUSCULOSKELETAL:  No edema; No deformity  SKIN: Warm and dry NEUROLOGIC:  Alert and oriented  x 3, non-focal PSYCHIATRIC:  Normal affect, good insight  ASSESSMENT:    1. Coronary artery disease involving native coronary artery of native heart without angina pectoris   2. Hypertension, benign   3. Type 2 diabetes mellitus with complication, without long-term current use of insulin ([place])   4. Mixed  hyperlipidemia   5. Tobacco use    PLAN:    Coronary artery disease-I am going to continue patient on her aspirin 81 mg daily and start 40 mg daily.  We did discuss her cath results also educated patient about her saccular aneurysm.  For now she prefers to be on aspirin and not convert to Plavix.  She will maintain on 30 mg daily for her chronic stable angina  2.  Hypertension her blood pressure is acceptable in the office today.  Continue patient her lisinopril 10 mg daily, hydrochlorothiazide 12.5 mg daily.  3.  In terms of her productive cough she does have a history of COPD, no fever shortness of breath therefore I am not going to start the patient on antibiotics at this time for this but I am asked her to see her pulmonary doctor who she tells me she will be seeing later this month.  4 tobacco use-the patient was counseled on tobacco cessation today for 5 minutes.  Counseling included reviewing the risks of smoking tobacco products, how it impacts the patient's current medical diagnoses and different strategies for quitting.  Pharmacotherapy to aid in tobacco cessation was not prescribed today. The patient coordinate with  primary care provider.  The patient was also advised to call   1-800-QUIT-NOW (218)600-5524) for additional help with quitting smoking.  The patient is in agreement with the above plan. The patient left the office in stable condition.  The patient will follow up in 6 months or sooner if needed.  Medication Adjustments/Labs and Tests Ordered: Current medicines are reviewed at length with the patient today.  Concerns regarding medicines are outlined above.  No orders of the defined types were placed in this encounter.  No orders of the defined types were placed in this encounter.   Patient Instructions  Medication Instructions:  Your physician recommends that you continue on your current medications as directed. Please refer to the Current Medication list given to you  today.  *If you need a refill on your cardiac medications before your next appointment, please call your pharmacy*  Lab Work: None If you have labs (blood work) drawn today and your tests are completely normal, you will receive your results only by: [name] MyChart Message (if you have MyChart) OR . A paper copy in the mail If you have any lab test that is abnormal or we need to change your treatment, we will call you to review the results.  Testing/Procedures: None  Follow-Up: At [facility], you and your health needs are our priority.  As part of our continuing mission to provide you with exceptional heart care, we have created designated Provider Care Teams.  These Care Teams include your primary Cardiologist (physician) and Advanced Practice Providers (APPs -  Physician Assistants and Nurse Practitioners) who all work together to provide you with the care you need, when you need it.  Your next appointment:   6 month(s)  The format for your next appointment:   In Person  Provider:   Berniece Salines, DO  Other Instructions      Adopting a Healthy Lifestyle.  Know what a healthy weight is for you (roughly BMI <25)  and aim to maintain this   Aim for 7+ servings of fruits and vegetables daily   65-80+ fluid ounces of water or unsweet tea for healthy kidneys   Limit to max 1 drink of alcohol per day; avoid smoking/tobacco   Limit animal fats in diet for cholesterol and heart health - choose grass fed whenever available   Avoid highly processed foods, and foods high in saturated/trans fats   Aim for low stress - take time to unwind and care for your mental health   Aim for 150 min of moderate intensity exercise weekly for heart health, and weights twice weekly for bone health   Aim for 7-9 hours of sleep daily   When it comes to diets, agreement about the perfect plan isnt easy to find, even among the experts. Experts at the [employer] developed an idea  known as the Healthy Eating Plate. Just imagine a plate divided into logical, healthy portions.   The emphasis is on diet quality:   Load up on vegetables and fruits - one-half of your plate: Aim for color and variety, and remember that potatoes dont count.   Go for whole grains - one-quarter of your plate: Whole wheat, barley, wheat berries, quinoa, oats, brown rice, and foods made with them. If you want pasta, go with whole wheat pasta.   Protein power - one-quarter of your plate: Fish, chicken, beans, and nuts are all healthy, versatile protein sources. Limit red meat.   The diet, however, does go beyond the plate, offering a few other suggestions.   Use healthy plant oils, such as olive, canola, soy, corn, sunflower and peanut. Check the labels, and avoid partially hydrogenated oil, which have unhealthy trans fats.   If youre thirsty, drink water. Coffee and tea are good in moderation, but skip sugary drinks and limit milk and dairy products to one or two daily servings.   The type of carbohydrate in the diet is more important than the amount. Some sources of carbohydrates, such as vegetables, fruits, whole grains, and beans-are healthier than others.   Finally, stay active  Signed, Berniece Salines, DO  01/24/2019 3:34 PM    [facility]

## 2019-01-24 NOTE — Patient Instructions (Signed)
Medication Instructions:  Your physician recommends that you continue on your current medications as directed. Please refer to the Current Medication list given to you today.  *If you need a refill on your cardiac medications before your next appointment, please call your pharmacy*  Lab Work: None If you have labs (blood work) drawn today and your tests are completely normal, you will receive your results only by: . MyChart Message (if you have MyChart) OR . A paper copy in the mail If you have any lab test that is abnormal or we need to change your treatment, we will call you to review the results.  Testing/Procedures: None  Follow-Up: At [facility], you and your health needs are our priority.  As part of our continuing mission to provide you with exceptional heart care, we have created designated Provider Care Teams.  These Care Teams include your primary Cardiologist (physician) and Advanced Practice Providers (APPs -  Physician Assistants and Nurse Practitioners) who all work together to provide you with the care you need, when you need it.  Your next appointment:   6 month(s)  The format for your next appointment:   In Person  Provider:   Kardie Tobb, DO  Other Instructions   

## 2019-04-27 ENCOUNTER — Other Ambulatory Visit: Payer: Self-pay | Admitting: Cardiology

## 2019-05-13 DIAGNOSIS — C154 Malignant neoplasm of middle third of esophagus: Secondary | ICD-10-CM | POA: Diagnosis not present

## 2019-05-19 DIAGNOSIS — C154 Malignant neoplasm of middle third of esophagus: Secondary | ICD-10-CM | POA: Diagnosis not present

## 2019-05-24 DIAGNOSIS — Z1159 Encounter for screening for other viral diseases: Secondary | ICD-10-CM | POA: Insufficient documentation

## 2019-05-24 DIAGNOSIS — C159 Malignant neoplasm of esophagus, unspecified: Secondary | ICD-10-CM | POA: Insufficient documentation

## 2019-06-12 ENCOUNTER — Inpatient Hospital Stay (HOSPITAL_COMMUNITY): Payer: Medicare Other

## 2019-06-12 ENCOUNTER — Emergency Department (HOSPITAL_COMMUNITY): Payer: Medicare Other

## 2019-06-12 ENCOUNTER — Inpatient Hospital Stay (HOSPITAL_COMMUNITY)
Admission: EM | Admit: 2019-06-12 | Discharge: 2019-06-14 | DRG: 178 | Disposition: A | Discharge status: Home / Self Care | Payer: Medicare Other | Attending: Internal Medicine | Admitting: Internal Medicine

## 2019-06-12 ENCOUNTER — Other Ambulatory Visit: Payer: Self-pay

## 2019-06-12 DIAGNOSIS — K5790 Diverticulosis of intestine, part unspecified, without perforation or abscess without bleeding: Secondary | ICD-10-CM | POA: Diagnosis present

## 2019-06-12 DIAGNOSIS — Z931 Gastrostomy status: Secondary | ICD-10-CM

## 2019-06-12 DIAGNOSIS — N179 Acute kidney failure, unspecified: Secondary | ICD-10-CM | POA: Diagnosis present

## 2019-06-12 DIAGNOSIS — R112 Nausea with vomiting, unspecified: Secondary | ICD-10-CM

## 2019-06-12 DIAGNOSIS — A419 Sepsis, unspecified organism: Secondary | ICD-10-CM

## 2019-06-12 DIAGNOSIS — Z09 Encounter for follow-up examination after completed treatment for conditions other than malignant neoplasm: Secondary | ICD-10-CM

## 2019-06-12 DIAGNOSIS — K219 Gastro-esophageal reflux disease without esophagitis: Secondary | ICD-10-CM

## 2019-06-12 DIAGNOSIS — Z79890 Hormone replacement therapy (postmenopausal): Secondary | ICD-10-CM

## 2019-06-12 DIAGNOSIS — Z7982 Long term (current) use of aspirin: Secondary | ICD-10-CM

## 2019-06-12 DIAGNOSIS — M5126 Other intervertebral disc displacement, lumbar region: Secondary | ICD-10-CM | POA: Diagnosis present

## 2019-06-12 DIAGNOSIS — E119 Type 2 diabetes mellitus without complications: Secondary | ICD-10-CM | POA: Diagnosis present

## 2019-06-12 DIAGNOSIS — I10 Essential (primary) hypertension: Secondary | ICD-10-CM | POA: Diagnosis present

## 2019-06-12 DIAGNOSIS — R1310 Dysphagia, unspecified: Secondary | ICD-10-CM | POA: Diagnosis present

## 2019-06-12 DIAGNOSIS — T451X5A Adverse effect of antineoplastic and immunosuppressive drugs, initial encounter: Secondary | ICD-10-CM | POA: Diagnosis present

## 2019-06-12 DIAGNOSIS — M1990 Unspecified osteoarthritis, unspecified site: Secondary | ICD-10-CM | POA: Diagnosis present

## 2019-06-12 DIAGNOSIS — R079 Chest pain, unspecified: Secondary | ICD-10-CM

## 2019-06-12 DIAGNOSIS — R55 Syncope and collapse: Secondary | ICD-10-CM | POA: Diagnosis present

## 2019-06-12 DIAGNOSIS — R6520 Severe sepsis without septic shock: Secondary | ICD-10-CM | POA: Diagnosis not present

## 2019-06-12 DIAGNOSIS — E861 Hypovolemia: Secondary | ICD-10-CM | POA: Diagnosis present

## 2019-06-12 DIAGNOSIS — I2510 Atherosclerotic heart disease of native coronary artery without angina pectoris: Secondary | ICD-10-CM | POA: Diagnosis present

## 2019-06-12 DIAGNOSIS — E782 Mixed hyperlipidemia: Secondary | ICD-10-CM | POA: Diagnosis present

## 2019-06-12 DIAGNOSIS — E872 Acidosis: Secondary | ICD-10-CM | POA: Diagnosis present

## 2019-06-12 DIAGNOSIS — J690 Pneumonitis due to inhalation of food and vomit: Secondary | ICD-10-CM | POA: Diagnosis present

## 2019-06-12 DIAGNOSIS — Z9109 Other allergy status, other than to drugs and biological substances: Secondary | ICD-10-CM

## 2019-06-12 DIAGNOSIS — E871 Hypo-osmolality and hyponatremia: Secondary | ICD-10-CM | POA: Diagnosis present

## 2019-06-12 DIAGNOSIS — I248 Other forms of acute ischemic heart disease: Secondary | ICD-10-CM | POA: Diagnosis present

## 2019-06-12 DIAGNOSIS — R000 Tachycardia, unspecified: Secondary | ICD-10-CM | POA: Diagnosis present

## 2019-06-12 DIAGNOSIS — Z800 Family history of malignant neoplasm of digestive organs: Secondary | ICD-10-CM

## 2019-06-12 DIAGNOSIS — F17200 Nicotine dependence, unspecified, uncomplicated: Secondary | ICD-10-CM | POA: Diagnosis present

## 2019-06-12 DIAGNOSIS — Z91030 Bee allergy status: Secondary | ICD-10-CM

## 2019-06-12 DIAGNOSIS — I9589 Other hypotension: Secondary | ICD-10-CM | POA: Diagnosis present

## 2019-06-12 DIAGNOSIS — E039 Hypothyroidism, unspecified: Secondary | ICD-10-CM | POA: Diagnosis present

## 2019-06-12 DIAGNOSIS — I4891 Unspecified atrial fibrillation: Secondary | ICD-10-CM | POA: Diagnosis present

## 2019-06-12 DIAGNOSIS — K760 Fatty (change of) liver, not elsewhere classified: Secondary | ICD-10-CM | POA: Diagnosis present

## 2019-06-12 DIAGNOSIS — Z8719 Personal history of other diseases of the digestive system: Secondary | ICD-10-CM

## 2019-06-12 DIAGNOSIS — D6481 Anemia due to antineoplastic chemotherapy: Secondary | ICD-10-CM | POA: Diagnosis present

## 2019-06-12 DIAGNOSIS — E860 Dehydration: Secondary | ICD-10-CM | POA: Diagnosis present

## 2019-06-12 DIAGNOSIS — Z20822 Contact with and (suspected) exposure to covid-19: Secondary | ICD-10-CM | POA: Diagnosis present

## 2019-06-12 DIAGNOSIS — Z7951 Long term (current) use of inhaled steroids: Secondary | ICD-10-CM

## 2019-06-12 DIAGNOSIS — I340 Nonrheumatic mitral (valve) insufficiency: Secondary | ICD-10-CM

## 2019-06-12 DIAGNOSIS — Z8041 Family history of malignant neoplasm of ovary: Secondary | ICD-10-CM

## 2019-06-12 DIAGNOSIS — K589 Irritable bowel syndrome without diarrhea: Secondary | ICD-10-CM | POA: Diagnosis present

## 2019-06-12 DIAGNOSIS — Z91040 Latex allergy status: Secondary | ICD-10-CM | POA: Diagnosis not present

## 2019-06-12 DIAGNOSIS — Z923 Personal history of irradiation: Secondary | ICD-10-CM

## 2019-06-12 DIAGNOSIS — Z801 Family history of malignant neoplasm of trachea, bronchus and lung: Secondary | ICD-10-CM

## 2019-06-12 DIAGNOSIS — C159 Malignant neoplasm of esophagus, unspecified: Secondary | ICD-10-CM | POA: Diagnosis present

## 2019-06-12 DIAGNOSIS — J449 Chronic obstructive pulmonary disease, unspecified: Secondary | ICD-10-CM | POA: Diagnosis present

## 2019-06-12 LAB — URINALYSIS, ROUTINE W REFLEX MICROSCOPIC
Bacteria, UA: NONE SEEN
Bilirubin Urine: NEGATIVE
Glucose, UA: NEGATIVE mg/dL
Hgb urine dipstick: NEGATIVE
Ketones, ur: NEGATIVE mg/dL
Leukocytes,Ua: NEGATIVE
Nitrite: NEGATIVE
Protein, ur: 30 mg/dL — AB
Specific Gravity, Urine: 1.025 (ref 1.005–1.030)
pH: 6 (ref 5.0–8.0)

## 2019-06-12 LAB — CBC WITH DIFFERENTIAL/PLATELET
Abs Immature Granulocytes: 0.03 10*3/uL (ref 0.00–0.07)
Basophils Absolute: 0 10*3/uL (ref 0.0–0.1)
Basophils Relative: 0 %
Eosinophils Absolute: 0 10*3/uL (ref 0.0–0.5)
Eosinophils Relative: 0 %
HCT: 36.2 % (ref 36.0–46.0)
Hemoglobin: 11.9 g/dL — ABNORMAL LOW (ref 12.0–15.0)
Immature Granulocytes: 0 %
Lymphocytes Relative: 6 %
Lymphs Abs: 0.5 10*3/uL — ABNORMAL LOW (ref 0.7–4.0)
MCH: 30 pg (ref 26.0–34.0)
MCHC: 32.9 g/dL (ref 30.0–36.0)
MCV: 91.2 fL (ref 80.0–100.0)
Monocytes Absolute: 0.3 10*3/uL (ref 0.1–1.0)
Monocytes Relative: 3 %
Neutro Abs: 8.3 10*3/uL — ABNORMAL HIGH (ref 1.7–7.7)
Neutrophils Relative %: 91 %
Platelets: 316 10*3/uL (ref 150–400)
RBC: 3.97 MIL/uL (ref 3.87–5.11)
RDW: 12.8 % (ref 11.5–15.5)
WBC: 9.2 10*3/uL (ref 4.0–10.5)
nRBC: 0 % (ref 0.0–0.2)

## 2019-06-12 LAB — LACTIC ACID, PLASMA
Lactic Acid, Venous: 2.5 mmol/L (ref 0.5–1.9)
Lactic Acid, Venous: 2.9 mmol/L (ref 0.5–1.9)
Lactic Acid, Venous: 1.8 mmol/L (ref 0.5–1.9)

## 2019-06-12 LAB — COMPREHENSIVE METABOLIC PANEL
ALT: 11 U/L (ref 0–44)
AST: 13 U/L — ABNORMAL LOW (ref 15–41)
Albumin: 3.3 g/dL — ABNORMAL LOW (ref 3.5–5.0)
Alkaline Phosphatase: 41 U/L (ref 38–126)
Anion gap: 16 — ABNORMAL HIGH (ref 5–15)
BUN: 27 mg/dL — ABNORMAL HIGH (ref 8–23)
CO2: 22 mmol/L (ref 22–32)
Calcium: 9.1 mg/dL (ref 8.9–10.3)
Chloride: 92 mmol/L — ABNORMAL LOW (ref 98–111)
Creatinine, Ser: 1.46 mg/dL — ABNORMAL HIGH (ref 0.44–1.00)
GFR calc Af Amer: 42 mL/min — ABNORMAL LOW (ref >60)
GFR calc non Af Amer: 36 mL/min — ABNORMAL LOW (ref >60)
Glucose, Bld: 180 mg/dL — ABNORMAL HIGH (ref 70–99)
Potassium: 3.8 mmol/L (ref 3.5–5.1)
Sodium: 130 mmol/L — ABNORMAL LOW (ref 135–145)
Total Bilirubin: 0.6 mg/dL (ref 0.3–1.2)
Total Protein: 6.7 g/dL (ref 6.5–8.1)

## 2019-06-12 LAB — BASIC METABOLIC PANEL
Anion gap: 8 (ref 5–15)
BUN: 18 mg/dL (ref 8–23)
CO2: 24 mmol/L (ref 22–32)
Calcium: 8 mg/dL — ABNORMAL LOW (ref 8.9–10.3)
Chloride: 101 mmol/L (ref 98–111)
Creatinine, Ser: 1.19 mg/dL — ABNORMAL HIGH (ref 0.44–1.00)
GFR calc Af Amer: 54 mL/min — ABNORMAL LOW (ref >60)
GFR calc non Af Amer: 47 mL/min — ABNORMAL LOW (ref >60)
Glucose, Bld: 135 mg/dL — ABNORMAL HIGH (ref 70–99)
Potassium: 3.8 mmol/L (ref 3.5–5.1)
Sodium: 133 mmol/L — ABNORMAL LOW (ref 135–145)

## 2019-06-12 LAB — SARS CORONAVIRUS 2 BY RT PCR (HOSPITAL ORDER, PERFORMED IN ~~LOC~~ HOSPITAL LAB): SARS Coronavirus 2: NEGATIVE

## 2019-06-12 LAB — TSH: TSH: 2.153 u[IU]/mL (ref 0.350–4.500)

## 2019-06-12 LAB — PROTIME-INR
INR: 1 (ref 0.8–1.2)
Prothrombin Time: 12.9 seconds (ref 11.4–15.2)

## 2019-06-12 LAB — I-STAT CHEM 8, ED
BUN: 26 mg/dL — ABNORMAL HIGH (ref 8–23)
Calcium, Ion: 1.12 mmol/L — ABNORMAL LOW (ref 1.15–1.40)
Chloride: 92 mmol/L — ABNORMAL LOW (ref 98–111)
Creatinine, Ser: 1.4 mg/dL — ABNORMAL HIGH (ref 0.44–1.00)
Glucose, Bld: 176 mg/dL — ABNORMAL HIGH (ref 70–99)
HCT: 35 % — ABNORMAL LOW (ref 36.0–46.0)
Hemoglobin: 11.9 g/dL — ABNORMAL LOW (ref 12.0–15.0)
Potassium: 3.4 mmol/L — ABNORMAL LOW (ref 3.5–5.1)
Sodium: 131 mmol/L — ABNORMAL LOW (ref 135–145)
TCO2: 25 mmol/L (ref 22–32)

## 2019-06-12 LAB — LIPASE, BLOOD: Lipase: 17 U/L (ref 11–51)

## 2019-06-12 LAB — PROCALCITONIN: Procalcitonin: <0.1 ng/mL

## 2019-06-12 LAB — TROPONIN I (HIGH SENSITIVITY)
Troponin I (High Sensitivity): 30 ng/L — ABNORMAL HIGH (ref <18)
Troponin I (High Sensitivity): 46 ng/L — ABNORMAL HIGH (ref <18)
Troponin I (High Sensitivity): 49 ng/L — ABNORMAL HIGH (ref <18)

## 2019-06-12 LAB — APTT: aPTT: 30 seconds (ref 24–36)

## 2019-06-12 LAB — CORTISOL: Cortisol, Plasma: 9.5 ug/dL

## 2019-06-12 LAB — BRAIN NATRIURETIC PEPTIDE: B Natriuretic Peptide: 82.9 pg/mL (ref 0.0–100.0)

## 2019-06-12 LAB — HIV ANTIBODY (ROUTINE TESTING W REFLEX): HIV Screen 4th Generation wRfx: NONREACTIVE

## 2019-06-12 LAB — MRSA PCR SCREENING: MRSA by PCR: NEGATIVE

## 2019-06-12 LAB — T4, FREE: Free T4: 1.59 ng/dL — ABNORMAL HIGH (ref 0.61–1.12)

## 2019-06-12 MED ORDER — ROSUVASTATIN CALCIUM 20 MG PO TABS
40.0000 mg | ORAL_TABLET | Freq: Every day | ORAL | Status: DC
  Administered 2019-06-12 – 2019-06-14 (×3): 40 mg via ORAL
  Filled 2019-06-12 (×3): qty 2

## 2019-06-12 MED ORDER — SODIUM CHLORIDE 0.9 % IV BOLUS (SEPSIS)
1000.0000 mL | Freq: Once | INTRAVENOUS | Status: AC
  Administered 2019-06-12: 1000 mL via INTRAVENOUS

## 2019-06-12 MED ORDER — FAMOTIDINE 20 MG PO TABS
20.0000 mg | ORAL_TABLET | Freq: Two times a day (BID) | ORAL | Status: DC
  Administered 2019-06-12 – 2019-06-14 (×5): 20 mg via ORAL
  Filled 2019-06-12 (×5): qty 1

## 2019-06-12 MED ORDER — SODIUM CHLORIDE 0.9 % IV BOLUS
500.0000 mL | Freq: Once | INTRAVENOUS | Status: AC
  Administered 2019-06-12: 500 mL via INTRAVENOUS

## 2019-06-12 MED ORDER — VANCOMYCIN HCL 750 MG/150ML IV SOLN
750.0000 mg | Freq: Two times a day (BID) | INTRAVENOUS | Status: DC
  Filled 2019-06-12: qty 150

## 2019-06-12 MED ORDER — SODIUM CHLORIDE 0.9 % IV SOLN
1000.0000 mL | INTRAVENOUS | Status: DC
  Administered 2019-06-12 – 2019-06-13 (×4): 1000 mL via INTRAVENOUS

## 2019-06-12 MED ORDER — LACTATED RINGERS IV BOLUS (SEPSIS)
1000.0000 mL | Freq: Once | INTRAVENOUS | Status: AC
  Administered 2019-06-12: 1000 mL via INTRAVENOUS

## 2019-06-12 MED ORDER — FLUTICASONE FUROATE-VILANTEROL 100-25 MCG/INH IN AEPB
1.0000 | INHALATION_SPRAY | Freq: Every evening | RESPIRATORY_TRACT | Status: DC
  Administered 2019-06-12 – 2019-06-13 (×2): 1 via RESPIRATORY_TRACT
  Filled 2019-06-12: qty 28

## 2019-06-12 MED ORDER — ASPIRIN EC 81 MG PO TBEC
81.0000 mg | DELAYED_RELEASE_TABLET | Freq: Every day | ORAL | Status: DC
  Administered 2019-06-12 – 2019-06-14 (×3): 81 mg via ORAL
  Filled 2019-06-12 (×3): qty 1

## 2019-06-12 MED ORDER — PIPERACILLIN-TAZOBACTAM 3.375 G IVPB: 3.3750 g | Freq: Three times a day (TID) | INTRAVENOUS | Status: DC

## 2019-06-12 MED ORDER — LIDOCAINE VISCOUS HCL 2 % MT SOLN: 15.0000 mL | OROMUCOSAL | Status: DC | PRN

## 2019-06-12 MED ORDER — ACETAMINOPHEN 650 MG RE SUPP: 650.0000 mg | Freq: Four times a day (QID) | RECTAL | Status: DC | PRN

## 2019-06-12 MED ORDER — FLUTICASONE PROPIONATE 50 MCG/ACT NA SUSP
2.0000 | Freq: Every day | NASAL | Status: DC
  Administered 2019-06-13 – 2019-06-14 (×2): 2 via NASAL
  Filled 2019-06-12: qty 16

## 2019-06-12 MED ORDER — IOHEXOL 350 MG/ML SOLN
100.0000 mL | Freq: Once | INTRAVENOUS | Status: AC | PRN
  Administered 2019-06-12: 100 mL via INTRAVENOUS

## 2019-06-12 MED ORDER — VANCOMYCIN HCL IN DEXTROSE 1-5 GM/200ML-% IV SOLN
1000.0000 mg | Freq: Once | INTRAVENOUS | Status: AC
  Administered 2019-06-12: 1000 mg via INTRAVENOUS
  Filled 2019-06-12: qty 200

## 2019-06-12 MED ORDER — VANCOMYCIN HCL 750 MG/150ML IV SOLN: 750.0000 mg | INTRAVENOUS | Status: DC

## 2019-06-12 MED ORDER — LEVOTHYROXINE SODIUM 75 MCG PO TABS
75.0000 ug | ORAL_TABLET | Freq: Every day | ORAL | Status: DC
  Administered 2019-06-13 – 2019-06-14 (×2): 75 ug via ORAL
  Filled 2019-06-12 (×2): qty 1

## 2019-06-12 MED ORDER — PANTOPRAZOLE SODIUM 40 MG PO TBEC
40.0000 mg | DELAYED_RELEASE_TABLET | Freq: Every day | ORAL | Status: DC
  Administered 2019-06-12 – 2019-06-14 (×3): 40 mg via ORAL
  Filled 2019-06-12 (×3): qty 1

## 2019-06-12 MED ORDER — SUCRALFATE 1 GM/10ML PO SUSP
1.0000 g | Freq: Three times a day (TID) | ORAL | Status: DC
  Administered 2019-06-12 – 2019-06-14 (×7): 1 g via ORAL
  Filled 2019-06-12 (×7): qty 10

## 2019-06-12 MED ORDER — IOHEXOL 180 MG/ML  SOLN: 50.0000 mL | Freq: Once | INTRAMUSCULAR | Status: DC | PRN

## 2019-06-12 MED ORDER — OXYCODONE HCL 5 MG PO TABS
5.0000 mg | ORAL_TABLET | ORAL | Status: DC | PRN
  Administered 2019-06-12 – 2019-06-13 (×2): 5 mg via ORAL
  Administered 2019-06-14: 10 mg via ORAL
  Filled 2019-06-12 (×2): qty 1
  Filled 2019-06-12: qty 2

## 2019-06-12 MED ORDER — PIPERACILLIN-TAZOBACTAM 3.375 G IVPB
3.3750 g | Freq: Three times a day (TID) | INTRAVENOUS | Status: DC
  Administered 2019-06-12 – 2019-06-13 (×3): 3.375 g via INTRAVENOUS
  Filled 2019-06-12 (×3): qty 50

## 2019-06-12 MED ORDER — BOOST / RESOURCE BREEZE PO LIQD CUSTOM
1.0000 | Freq: Two times a day (BID) | ORAL | Status: DC
  Administered 2019-06-12: 1 via ORAL

## 2019-06-12 MED ORDER — ONDANSETRON HCL 4 MG/2ML IJ SOLN: 4.0000 mg | Freq: Four times a day (QID) | INTRAMUSCULAR | Status: DC | PRN

## 2019-06-12 MED ORDER — PIPERACILLIN-TAZOBACTAM 3.375 G IVPB 30 MIN
3.3750 g | Freq: Once | INTRAVENOUS | Status: AC
  Administered 2019-06-12: 3.375 g via INTRAVENOUS
  Filled 2019-06-12: qty 50

## 2019-06-12 MED ORDER — ACETAMINOPHEN 325 MG PO TABS
650.0000 mg | ORAL_TABLET | Freq: Four times a day (QID) | ORAL | Status: DC | PRN
  Administered 2019-06-12 – 2019-06-14 (×4): 650 mg via ORAL
  Filled 2019-06-12 (×4): qty 2

## 2019-06-12 MED ORDER — ENOXAPARIN SODIUM 40 MG/0.4ML ~~LOC~~ SOLN
40.0000 mg | SUBCUTANEOUS | Status: DC
  Administered 2019-06-12 – 2019-06-14 (×3): 40 mg via SUBCUTANEOUS
  Filled 2019-06-12 (×3): qty 0.4

## 2019-06-12 MED ORDER — LACTATED RINGERS IV BOLUS
1000.0000 mL | Freq: Once | INTRAVENOUS | Status: AC
  Administered 2019-06-12: 1000 mL via INTRAVENOUS

## 2019-06-12 NOTE — Progress Notes (Signed)
Pharmacy Antibiotic Note  [name] is a 69 y.o. female admitted on 06/12/2019 with nausea and vomiting, r/o sepsis.  Pharmacy has been consulted for Vancomycin/Zosyn dosing. WBC WNL. Noted bump in Scr. Hypotensive. Currently on chemo.   Plan: Vancomycin 750 mg IV q24h >>Estimated AUC: 542 Zosyn 3.375G IV q8h to be infused over 4 hours Trend WBC, temp, renal function  F/U infectious work-up Drug levels as indicated  Height: 5\' 2"  (157.5 cm) Weight: 79.4 kg (175 lb) IBW/kg (Calculated) : 50.1  Temp (24hrs), Avg:97.2 F (36.2 C), Min:96.2 F (35.7 C), Max:98.2 F (36.8 C)  Recent Labs  Lab 06/12/19 0020 06/12/19 0034 06/12/19 0244  WBC 9.2  --   --   CREATININE 1.46* 1.40*  --   LATICACIDVEN 2.5*  --  2.9*    Estimated Creatinine Clearance: 37 mL/min (A) (by C-G formula based on SCr of 1.4 mg/dL (H)).    Allergies  Allergen Reactions  . Ether Nausea And Vomiting  . Nickel Other (See Comments)    infection  . Latex Rash   Narda Bonds, PharmD, BCPS Clinical Pharmacist Phone: [phone]

## 2019-06-12 NOTE — ED Notes (Signed)
Patient transported to CT 

## 2019-06-12 NOTE — Progress Notes (Signed)
Patient yelled out with sudden onset of excruciating pain initiating in the mid sternum and radiating to her back.  She has had these episodes a several times in the last week and a half.  She states that sometimes it happens and she "passes out".   Pain continued for approx 20 mins.  Vital signs taken.   EKG done  Patient admits that these episodes happans usually after she eats or drinks something.   Will continue to monitor.   Charge nurse notified if incident  MD notified. Of incident

## 2019-06-12 NOTE — ED Notes (Signed)
Attempted to call report; advised that the nurse was in the room with a patient and will call me back.

## 2019-06-12 NOTE — ED Triage Notes (Addendum)
Pt has hx of esophageal cancer; had last chemo on Thursday (2 days ago). Has has N/V since 22:45 . Pt see's phys in [place] for her cancer (chemo and radiations). EMS gave pt 20 mg of Cardizem. Pt alert and oriented x4, NAD noted.EMS states pt is new onset Afib. Pt also given 324 asa and Zofran 4mg  PO.

## 2019-06-12 NOTE — Evaluation (Signed)
Clinical/Bedside Swallow Evaluation Patient Details  Name: [name] MRN: [id number] Date of Birth: Dec 23, 1950  Today's Date: 06/12/2019 Time: SLP Start Time (ACUTE ONLY): 47 SLP Stop Time (ACUTE ONLY): 1104 SLP Time Calculation (min) (ACUTE ONLY): 14 min  Past Medical History:  Past Medical History:  Diagnosis Date  . Asthma   . COPD (chronic obstructive pulmonary disease) ([place])   . Diastolic dysfunction   . Diverticulosis   . Fatty liver   . GERD without esophagitis   . Hx of Clostridium difficile infection   . Hx of colonic polyps   . Hx of small bowel obstruction   . Hypertension, benign   . Hypothyroidism   . Irritable bowel syndrome   . Lumbar herniated disc   . Mixed hyperlipidemia   . Osteoarthritis    Past Surgical History:  Past Surgical History:  Procedure Laterality Date  . ABDOMINAL HYSTERECTOMY  2002  . APPENDECTOMY  2005  . CARPAL TUNNEL RELEASE Bilateral   . LEFT HEART CATH AND CORONARY ANGIOGRAPHY N/A 12/28/2018   Procedure: LEFT HEART CATH AND CORONARY ANGIOGRAPHY;  Surgeon: Belva Crome, MD;  Location: [facility];  Service: Cardiovascular;  Laterality: N/A;  . SMALL INTESTINE SURGERY  11/26/2012   Bowel obstruction, Dr. Pauletta Browns  . TUBAL LIGATION  1980   HPI:  [name] is a 69 y.o. female with medical history significant of esophageal cancer on chemo and radiation, CAD, asthma, COPD, GERD, hypertension, hypothyroidism presenting to the ED with complaints of near syncope and vomiting.  Upon EMS arrival, patient was found to be in A. fib with RVR.  Pt has a PEG as a preventative measure, but she currently only uses it for one medication and she gets all other nutrition PO.  Chest CT on 06/12/19 reported: "Patchy opacity in the left lower greater than right lower lobe dependently. Differential considerations include atelectasis, pneumonia, aspiration or potentially post radiation change."     Assessment / Plan /  Recommendation Clinical Impression  Pt was seen for a bedside swallow evaluation and she presents with suspected mild oropharyngeal dysphagia.  She stated that she occasionally has coughing with PO intake and that she has a PEG tube as a preventative measure given esophageal cancer.   She reported that she only uses the PEG tube for one medication, otherwise she consumes soft solids and thin liquids as her main source of nutrition.  She stated that she has not had a clinical or instrumental swallow study before.  Oral mechanism exam was unremarkable.  Pt consumed trials of thin liquid, puree, and ground solids.  She exhibited prolonged mastication of solids secondary to edentulism, but no oral residue was observed.  Overt s/sx of aspiration were observed with thin liquid via straw sip c/b an immediate cough in 2/2 trials.  No clinical s/sx of aspiration were observed with any additional trials.  Recommend initiation of Dysphagia 2 (fine chop) solids and thin liquids (NO  STRAWS) with medications administered crushed in puree.  Due to chest CT concerning for possible aspiration, clinical s/sx of aspiration with thin liquid via straw sip, and esophageal cancer, pt would likely benefit from an instrumental swallow study to further evaluate her swallow function.   SLP will f/u per POC.    SLP Visit Diagnosis: Dysphagia, unspecified (R13.10)    Aspiration Risk  Mild aspiration risk    Diet Recommendation Dysphagia 2 (Fine chop);Thin liquid   Liquid Administration via: Cup;No straw Medication Administration: Crushed with puree  Supervision: Patient able to self feed;Intermittent supervision to cue for compensatory strategies Compensations: Slow rate;Small sips/bites Postural Changes: Seated upright at 90 degrees    Other  Recommendations Oral Care Recommendations: Oral care BID   Follow up Recommendations Other (comment)(TBD)      Frequency and Duration min 2x/week  2 weeks       Prognosis  Prognosis for Safe Diet Advancement: Good      Swallow Study   General HPI: [name] is a 69 y.o. female with medical history significant of esophageal cancer on chemo and radiation, CAD, asthma, COPD, GERD, hypertension, hypothyroidism presenting to the ED with complaints of near syncope and vomiting.  Upon EMS arrival, patient was found to be in A. fib with RVR.  Pt has a PEG as a preventative measure, but she currently only uses it for one medication and she gets all other nutrition PO.   Type of Study: Bedside Swallow Evaluation Previous Swallow Assessment: None in documenation and per pt report  Diet Prior to this Study: NPO Temperature Spikes Noted: Yes Respiratory Status: Room air History of Recent Intubation: No Behavior/Cognition: Alert;Cooperative Oral Cavity Assessment: Within Functional Limits Oral Care Completed by SLP: No Oral Cavity - Dentition: Edentulous Vision: Functional for self-feeding Self-Feeding Abilities: Needs set up Patient Positioning: Upright in bed Baseline Vocal Quality: Normal Volitional Cough: Weak Volitional Swallow: Able to elicit    Oral/Motor/Sensory Function Overall Oral Motor/Sensory Function: Within functional limits   Ice Chips Ice chips: Within functional limits Presentation: Spoon   Thin Liquid Thin Liquid: Impaired Presentation: Spoon;Straw;Cup Pharyngeal  Phase Impairments: Suspected delayed Swallow;Cough - Immediate    Nectar Thick Nectar Thick Liquid: Not tested   Honey Thick Honey Thick Liquid: Not tested   Puree Puree: Within functional limits Presentation: Spoon   Solid     Solid: Impaired Presentation: Spoon Oral Phase Impairments: Impaired mastication     [name]., M.S., [place] Office: [phone]  [place] 06/12/2019,11:27 AM

## 2019-06-12 NOTE — Progress Notes (Signed)
Pharmacy Antibiotic Note  Talea Manges is a 69 y.o. female admitted on 06/12/2019 with nausea and vomiting, r/o sepsis.  Pharmacy has been re-consulted for Vancomycin/Zosyn dosing. WBC WNL. Noted bump in Scr from prior. Hypotensive. Currently on chemo and radiation for esophageal cancer. CTA concerning for PNA. Her O2 Sats are 100% on RA. Will dose vancomycin by trough due to vancomycin reagent shortage.   Plan: Vancomycin 750 mg IV q12h Zosyn 3.375G IV q8h to be infused over 4 hours Trend WBC, temp, renal function  F/U infectious work-up Drug levels as indicated Follow up MRSA PCR nasal swab and other opportunities for de-escalation   Antimicrobials this admission:  vancomycin 6/6 >>  zosyn 6/6 >>   Dose adjustments this admission:  N/A  Microbiology results:  6/6 BCx: sent 6/6 UCx: sent  6/6 MRSA PCR: ordered 6/6 covid: negative   Height: 5\' 2"  (157.5 cm) Weight: 83 kg (182 lb 14.4 oz) IBW/kg (Calculated) : 50.1  Temp (24hrs), Avg:98.4 F (36.9 C), Min:96.2 F (35.7 C), Max:99.4 F (37.4 C)  Recent Labs  Lab 06/12/19 0020 06/12/19 0034 06/12/19 0244 06/12/19 0610  WBC 9.2  --   --   --   CREATININE 1.46* 1.40*  --  1.19*  LATICACIDVEN 2.5*  --  2.9* 1.8    Estimated Creatinine Clearance: 44.6 mL/min (A) (by C-G formula based on SCr of 1.19 mg/dL (H)).    Allergies  Allergen Reactions  . Ether Nausea And Vomiting  . Nickel Other (See Comments)    infection  . Latex Rash   Thank you,   Eddie Candle, PharmD PGY-1 Pharmacy Resident   Please check amion for clinical pharmacist contact number

## 2019-06-12 NOTE — ED Notes (Signed)
Multiple attempts (by lab, RN, and NT) made to obtain blood specimens.

## 2019-06-12 NOTE — ED Notes (Signed)
Dierdre Forth daughter [phone] looking for an update on pt

## 2019-06-12 NOTE — Progress Notes (Signed)
  Echocardiogram 2D Echocardiogram has been performed.  [name] 06/12/2019, 5:43 PM

## 2019-06-12 NOTE — ED Provider Notes (Signed)
[facility] 4E CV SURGICAL PROGRESSIVE CARE Provider Note  CSN: [id number] Arrival date & time: 06/12/19 0010  Chief Complaint(s) Near Syncope  HPI [name] is a 69 y.o. female with a past medical history of esophageal cancer currently undergoing chemoradiation who presents to the emergency department with several days of generalized fatigue.  Patient also reports 1 month of episodic syncopal and near syncopal episodes followed by emesis.  Patient denies any recent known infections.  No coughing or congestion.  Patient reports emesis only during the syncopal/near syncopal episodes.  No chest pain.  She does endorse mild shortness of breath currently.  No abdominal pain.  Patient does endorse decreased oral intake intolerance.  No urinary symptoms.  Patient had an episode of near syncope followed by emesis tonight prompting her to call EMS.  When they arrived they noted patient was in A. fib RVR.  They gave patient 20 mg of Cardizem.   HPI  Past Medical History Past Medical History:  Diagnosis Date  . Asthma   . COPD (chronic obstructive pulmonary disease) ([place])   . Diastolic dysfunction   . Diverticulosis   . Fatty liver   . GERD without esophagitis   . Hx of Clostridium difficile infection   . Hx of colonic polyps   . Hx of small bowel obstruction   . Hypertension, benign   . Hypothyroidism   . Irritable bowel syndrome   . Lumbar herniated disc   . Mixed hyperlipidemia   . Osteoarthritis    Patient Active Problem List   Diagnosis Date Noted  . Severe sepsis ([place]) 06/12/2019  . Coronary artery disease of native artery of native heart with stable angina pectoris ([place]) 12/23/2018  . Essential hypertension 12/23/2018  . Tobacco use 12/23/2018  . Type 2 diabetes mellitus with complication, without long-term current use of insulin ([place]) 12/23/2018  . Osteoarthritis   . Mixed hyperlipidemia   . Lumbar herniated disc   . Irritable bowel syndrome   . Hypothyroidism   .  Hypertension, benign   . Hx of small bowel obstruction   . Hx of colonic polyps   . Hx of Clostridium difficile infection   . GERD without esophagitis   . Fatty liver   . Diverticulosis   . Diastolic dysfunction   . COPD (chronic obstructive pulmonary disease) ([place])   . Asthma    Home Medication(s) Prior to Admission medications   Medication Sig Start Date End Date Taking? Authorizing Provider  aspirin EC 81 MG tablet Take 1 tablet (81 mg total) by mouth daily. 12/22/18   Tobb, Kardie, DO  BREO ELLIPTA 100-25 MCG/INH AEPB Inhale 1 puff into the lungs every evening.  08/18/18   [provider]  calcium carbonate (TUMS - DOSED IN MG ELEMENTAL CALCIUM) 500 MG chewable tablet Chew 1 tablet by mouth daily.    [provider]  Eluxadoline (VIBERZI) 75 MG TABS Take 75 mg by mouth 2 (two) times daily as needed (IBS).     [provider]  EPINEPHrine 0.3 mg/0.3 mL IJ SOAJ injection Inject 0.3 mg into the muscle as needed for anaphylaxis. 11/14/18   [provider]  fluticasone (FLONASE) 50 MCG/ACT nasal spray Place 1 spray into both nostrils 2 (two) times daily.    [provider]  gabapentin (NEURONTIN) 300 MG capsule Take 300-600 mg by mouth See admin instructions. Take 1 capsule (300 mg) by mouth in the morning & take 2 capsules (600 mg) by mouth in the evening. 08/19/18  [provider]  isosorbide mononitrate (IMDUR) 30 MG 24 hr tablet Take 1 tablet (30 mg total) by mouth daily. Please call office to schedule follow up visit. 04/27/19   Tobb, Kardie, DO  levothyroxine (SYNTHROID) 75 MCG tablet Take 75 mcg by mouth daily before breakfast.  08/23/18   [provider]  lisinopril-hydrochlorothiazide (ZESTORETIC) 10-12.5 MG tablet Take 1 tablet by mouth daily.  08/19/18   [provider]  pantoprazole (PROTONIX) 40 MG tablet Take 40 mg by mouth daily.  10/14/18   [provider]  rifaximin (XIFAXAN) 550 MG TABS tablet Take  550 mg by mouth See admin instructions. Take 1 tablet (550 mg) by mouth twice daily for 2 weeks, hold for 4 months then resume cycle    [provider]  rosuvastatin (CRESTOR) 40 MG tablet Take 1 tablet (40 mg total) by mouth daily. Please call office to schedule follow up visit. 04/27/19   Tobb, Kardie, DO  TREMFYA 100 MG/ML SOSY Inject 100 mg into the skin See admin instructions. Every 2 months 10/21/18   [provider]                                                                                                                                    Past Surgical History Past Surgical History:  Procedure Laterality Date  . ABDOMINAL HYSTERECTOMY  2002  . APPENDECTOMY  2005  . CARPAL TUNNEL RELEASE Bilateral   . LEFT HEART CATH AND CORONARY ANGIOGRAPHY N/A 12/28/2018   Procedure: LEFT HEART CATH AND CORONARY ANGIOGRAPHY;  Surgeon: Belva Crome, MD;  Location: [place] [facility];  Service: Cardiovascular;  Laterality: N/A;  . SMALL INTESTINE SURGERY  11/26/2012   Bowel obstruction, Dr. Pauletta Browns  . TUBAL LIGATION  1980   Family History Family History  Problem Relation Age of Onset  . Ovarian cancer Sister   . Lung cancer Brother   . Stomach cancer Maternal Grandmother   . Colon cancer Daughter     Social History Social History   Tobacco Use  . Smoking status: Current Every Day Smoker    Packs/day: 1.00    Last attempt to quit: 11/2012    Years since quitting: 6.6  . Smokeless tobacco: Never Used  Substance Use Topics  . Alcohol use: Not Currently  . Drug use: Not Currently   Allergies Ether, Nickel, and Latex  Review of Systems Review of Systems All other systems are reviewed and are negative for acute change except as noted in the HPI  Physical Exam Vital Signs  I have reviewed the triage vital signs BP (!) 93/53   Pulse 95   Temp 99.4 F (37.4 C) (Oral)   Resp 16   Ht 5\' 2"  (1.575 m)   Wt 79.4 kg   SpO2 100%   BMI 32.01 kg/m   Physical  Exam Vitals reviewed.  Constitutional:      General: She  is not in acute distress.    Appearance: She is well-developed. She is not diaphoretic.  HENT:     Head: Normocephalic and atraumatic.     Nose: Nose normal.  Eyes:     General: No scleral icterus.       Right eye: No discharge.        Left eye: No discharge.     Conjunctiva/sclera: Conjunctivae normal.     Pupils: Pupils are equal, round, and reactive to light.  Cardiovascular:     Rate and Rhythm: Tachycardia present. Rhythm irregularly irregular.     Heart sounds: No murmur. No friction rub. No gallop.   Pulmonary:     Effort: Pulmonary effort is normal. No respiratory distress.     Breath sounds: Normal breath sounds. No stridor. No rales.  Chest:    Abdominal:     General: There is no distension.     Palpations: Abdomen is soft.     Tenderness: There is no abdominal tenderness.    Musculoskeletal:        General: No tenderness.     Cervical back: Normal range of motion and neck supple.  Skin:    General: Skin is warm and dry.     Findings: No erythema or rash.  Neurological:     Mental Status: She is alert and oriented to person, place, and time.     ED Results and Treatments Labs (all labs ordered are listed, but only abnormal results are displayed) Labs Reviewed  T4, FREE - Abnormal; Notable for the following components:      Result Value   Free T4 1.59 (*)    All other components within normal limits  LACTIC ACID, PLASMA - Abnormal; Notable for the following components:   Lactic Acid, Venous 2.5 (*)    All other components within normal limits  LACTIC ACID, PLASMA - Abnormal; Notable for the following components:   Lactic Acid, Venous 2.9 (*)    All other components within normal limits  URINALYSIS, ROUTINE W REFLEX MICROSCOPIC - Abnormal; Notable for the following components:   Protein, ur 30 (*)    All other components within normal limits  CBC WITH DIFFERENTIAL/PLATELET - Abnormal; Notable for  the following components:   Hemoglobin 11.9 (*)    Neutro Abs 8.3 (*)    Lymphs Abs 0.5 (*)    All other components within normal limits  COMPREHENSIVE METABOLIC PANEL - Abnormal; Notable for the following components:   Sodium 130 (*)    Chloride 92 (*)    Glucose, Bld 180 (*)    BUN 27 (*)    Creatinine, Ser 1.46 (*)    Albumin 3.3 (*)    AST 13 (*)    GFR calc non Af Amer 36 (*)    GFR calc Af Amer 42 (*)    Anion gap 16 (*)    All other components within normal limits  I-STAT CHEM 8, ED - Abnormal; Notable for the following components:   Sodium 131 (*)    Potassium 3.4 (*)    Chloride 92 (*)    BUN 26 (*)    Creatinine, Ser 1.40 (*)    Glucose, Bld 176 (*)    Calcium, Ion 1.12 (*)    Hemoglobin 11.9 (*)    HCT 35.0 (*)    All other components within normal limits  TROPONIN I (HIGH SENSITIVITY) - Abnormal; Notable for the following components:   Troponin I (High Sensitivity) 30 (*)  All other components within normal limits  TROPONIN I (HIGH SENSITIVITY) - Abnormal; Notable for the following components:   Troponin I (High Sensitivity) 46 (*)    All other components within normal limits  SARS CORONAVIRUS 2 BY RT PCR (HOSPITAL ORDER, [facility])  CULTURE, BLOOD (ROUTINE X 2)  CULTURE, BLOOD (ROUTINE X 2)  URINE CULTURE  PROTIME-INR  TSH  BRAIN NATRIURETIC PEPTIDE  APTT  HIV ANTIBODY (ROUTINE TESTING W REFLEX)  LACTIC ACID, PLASMA  BASIC METABOLIC PANEL                                                                                                                         EKG  EKG Interpretation  Date/Time:  Sunday June 12 2019 00:15:20 EDT Ventricular Rate:  154 PR Interval:    QRS Duration: 123 QT Interval:  288 QTC Calculation: 461 R Axis:   48 Text Interpretation: Atrial fibrillation with rapid ventricular response Left bundle branch block Confirmed by Addison Lank ([id number]) on 06/12/2019 12:57:26 AM       EKG  Interpretation  Date/Time:  Sunday June 12 2019 03:38:35 EDT Ventricular Rate:  102 PR Interval:    QRS Duration: 130 QT Interval:  345 QTC Calculation: 450 R Axis:   36 Text Interpretation: Sinus tachycardia Left bundle branch block resolved atrial fib. improved rate. Confirmed by Addison Lank [phone]) on 06/12/2019 5:16:24 AM       Radiology CT Angio Chest PE W and/or Wo Contrast  Result Date: 06/12/2019 CLINICAL DATA:  Shortness of breath. History of esophageal cancer, active chemotherapy and radiation. EXAM: CT ANGIOGRAPHY CHEST WITH CONTRAST TECHNIQUE: Multidetector CT imaging of the chest was performed using the standard protocol during bolus administration of intravenous contrast. Multiplanar CT image reconstructions and MIPs were obtained to evaluate the vascular anatomy. CONTRAST:  63 mL OMNIPAQUE IOHEXOL 350 MG/ML SOLN COMPARISON:  Radiograph earlier today.  Chest CTA 04/03/2019 FINDINGS: Cardiovascular: There are no filling defects within the pulmonary arteries to suggest pulmonary embolus. Aortic atherosclerosis without dissection. No aortic aneurysm. Heart is normal in size. There are coronary artery calcifications. No pericardial effusion. Right chest port in place. Mediastinum/Nodes: Significant increase in irregular esophageal wall thickening extending from the mid esophagus to the gastroesophageal junction from the level of the carina distally. 11 mm right hilar node, nonspecific. No enlarged mediastinal lymph nodes. No visualized thyroid nodule. Lungs/Pleura: Patchy opacity in the left lower greater than right lower lobe dependently. Mild central bronchial thickening. Mild underlying emphysema. There is a 3 mm small perifissural left upper lobe pulmonary nodule, series 7, image 21. No pleural fluid. Upper Abdomen: No acute findings. No free fluid. No adrenal nodule. Musculoskeletal: Degenerative change in the spine. There are no acute or suspicious osseous abnormalities. Review of  the MIP images confirms the above findings. IMPRESSION: 1. No pulmonary embolus. 2. Progression in irregular esophageal wall thickening extending from the mid esophagus to the gastroesophageal junction, patient with known primary esophageal  malignancy. 3. Patchy opacity in the left lower greater than right lower lobe dependently. Differential considerations include atelectasis, pneumonia, aspiration or potentially post radiation change. 4. Prominent right hilar node measuring 11 mm, nonspecific. Tiny 3 mm left upper lobe pulmonary nodule, also seen on prior exams. Continued follow-up recommended. Aortic Atherosclerosis (ICD10-I70.0) and Emphysema (ICD10-J43.9). Electronically Signed   By: Keith Rake M.D.   On: 06/12/2019 02:04   DG Chest Portable 1 View  Result Date: 06/12/2019 CLINICAL DATA:  Initial evaluation for acute shortness of breath. EXAM: PORTABLE CHEST 1 VIEW COMPARISON:  Prior radiograph from 05/27/2019. FINDINGS: Transverse heart size within normal limits. Mediastinal silhouette normal. Aortic atherosclerosis. Right-sided Port-A-Cath in place with tip overlying the mid SVC. Defibrillator pads overlie the left chest. Lungs mildly hypoinflated. Minimal streaky left basilar subsegmental atelectasis. No focal infiltrates. No edema or pleural effusion. No pneumothorax. Visualized osseous structures within normal limits. IMPRESSION: 1. Minimal streaky left basilar subsegmental atelectasis. 2. No other active cardiopulmonary disease. 3.  Aortic Atherosclerosis (ICD10-I70.0). Electronically Signed   By: Jeannine Boga M.D.   On: 06/12/2019 00:40    Pertinent labs & imaging results that were available during my care of the patient were reviewed by me and considered in my medical decision making (see chart for details).  Medications Ordered in ED Medications  sodium chloride 0.9 % bolus 1,000 mL (0 mLs Intravenous Stopped 06/12/19 0118)    Followed by  sodium chloride 0.9 % bolus 1,000 mL (0  mLs Intravenous Stopped 06/12/19 0139)    Followed by  0.9 %  sodium chloride infusion (0 mLs Intravenous Stopped 06/12/19 0458)  vancomycin (VANCOREADY) IVPB 750 mg/150 mL (has no administration in time range)  piperacillin-tazobactam (ZOSYN) IVPB 3.375 g (has no administration in time range)  enoxaparin (LOVENOX) injection 40 mg (has no administration in time range)  acetaminophen (TYLENOL) tablet 650 mg (has no administration in time range)    Or  acetaminophen (TYLENOL) suppository 650 mg (has no administration in time range)  iohexol (OMNIPAQUE) 350 MG/ML injection 100 mL (100 mLs Intravenous Contrast Given 06/12/19 0141)  lactated ringers bolus 1,000 mL (0 mLs Intravenous Stopped 06/12/19 0502)  vancomycin (VANCOCIN) IVPB 1000 mg/200 mL premix (0 mg Intravenous Stopped 06/12/19 0418)  piperacillin-tazobactam (ZOSYN) IVPB 3.375 g (0 g Intravenous Stopped 06/12/19 0334)                                                                                                                                    Procedures .1-3 Lead EKG Interpretation Performed by: Fatima Blank, MD Authorized by: Fatima Blank, MD     Interpretation: abnormal     ECG rate:  168   ECG rate assessment: tachycardic     Rhythm: atrial fibrillation     Ectopy: none     Conduction: normal   .1-3 Lead EKG Interpretation Performed by: Fatima Blank, MD Authorized by: Fatima Blank,  MD     Interpretation: normal     ECG rate:  99   ECG rate assessment: tachycardic     Rhythm: sinus tachycardia     Ectopy: none     Conduction: normal   .Critical Care Performed by: Fatima Blank, MD Authorized by: Fatima Blank, MD    CRITICAL CARE Performed by: Grayce Sessions Glendon Fiser Total critical care time: 75 minutes Critical care time was exclusive of separately billable procedures and treating other patients. Critical care was necessary to treat or prevent imminent or  life-threatening deterioration. Critical care was time spent personally by me on the following activities: development of treatment plan with patient and/or surrogate as well as nursing, discussions with consultants, evaluation of patient's response to treatment, examination of patient, obtaining history from patient or surrogate, ordering and performing treatments and interventions, ordering and review of laboratory studies, ordering and review of radiographic studies, pulse oximetry and re-evaluation of patient's condition.   (including critical care time)  Medical Decision Making / ED Course I have reviewed the nursing notes for this encounter and the patient's prior records (if available in EHR or on provided paperwork).   Mayana Irigoyen was evaluated in Emergency Department on 06/12/2019 for the symptoms described in the history of present illness. She was evaluated in the context of the global COVID-19 pandemic, which necessitated consideration that the patient might be at risk for infection with the SARS-CoV-2 virus that causes COVID-19. Institutional protocols and algorithms that pertain to the evaluation of patients at risk for COVID-19 are in a state of rapid change based on information released by regulatory bodies including the CDC and federal and state organizations. These policies and algorithms were followed during the patient's care in the ED.  Patient in A. fib RVR with soft blood pressures. Patient cannot feel the A. fib and tachycardia.  Cannot determine onset time. Patient is not anticoagulated.  Considering side effect from chemo, dehydration, pulmonary embolism, infection. Work-up negative for PE.  Possible bibasilar pneumonia/aspiration versus radiation pneumonitis.  No leukocytosis but patient is undergoing chemo.  Lactic acid is elevated.  Given her vitals,, code sepsis was initiated and patient was started on empiric antibiotics.  IV fluids had already been initiated and  she had already received 30 cc/kg of IV fluids.  After 4 L of IV fluids, patient's rate began to improve and she converted to normal sinus rhythm.  Troponin is mildly elevated likely due to demand from A. fib RVR.  Patient was admitted to medicine for further work-up and management.     Final Clinical Impression(s) / ED Diagnoses Final diagnoses:  Atrial fibrillation with RVR (HCC)  Hypotension due to hypovolemia  Sepsis with acute renal failure without septic shock, due to unspecified organism, unspecified acute renal failure type [facility])      This chart was dictated using voice recognition software.  Despite best efforts to proofread,  errors can occur which can change the documentation meaning.   Fatima Blank, MD 06/12/19 [phone]

## 2019-06-12 NOTE — ED Notes (Signed)
Attempted to call report; advised by Charge Nurse, Tim that nurse remains in another pt's room. Requested that Charge nurse take report; he advised that he is in a patient's room also and will call back.

## 2019-06-12 NOTE — Progress Notes (Addendum)
[facility] PLAN OF CARE NOTE Patient: [name] EXN:[id number]   PCP: [place], Sarah T, MD DOB: Nov 01, 1950   DOA: 06/12/2019   DOS: 06/12/2019    Patient was admitted by my colleague Dr. Marlowe Sax earlier on 06/12/2019. I have reviewed the H&P as well as assessment and plan and agree with the same. Important changes in the plan are listed below.  Plan of care: Principal Problem:   Severe sepsis ([place]) Active Problems:   Aspiration pneumonia (HCC)   Atrial fibrillation with rapid ventricular response (HCC)   AKI (acute kidney injury) ([place])   Nausea & vomiting Still hypotensive.  Improved after LR bolus. Continue IV LR for now. Continue IV antibiotics.  MRSA PCR negative discontinue vancomycin. PEG tube site appears okay. X-ray shows PEG tube in appropriate position. Speech therapy recommends dysphagia to thin liquid diet. Patient still primarily eat food and has had nutritional intake by mouth. Dietary consult for further assistance. Carafate and lidocaine as needed for chest pain while swallowing.  A. fib with RVR.  Currently normal sinus rhythm. Monitor.  AKI.  Improving with IV fluids.  Continue to monitor.  Med rec performed verified with patient that she does not take Decadron on a regular basis.  Cortisol level also okay.  Author: Berle Mull, MD [facility] 06/12/2019 6:21 PM   If 7PM-7AM, please contact night-coverage at [email or web address]

## 2019-06-12 NOTE — Progress Notes (Signed)
Patient arrived on the unit from the ED on a stretcher, assessment completed see flowsheet, placed on tele ccmd notified, patient oriented to room and staff, bed in lowest position, call bell within reach will continue to monitor.

## 2019-06-12 NOTE — Plan of Care (Signed)
  Problem: Education: Goal: Knowledge of General Education information will improve Description Including pain rating scale, medication(s)/side effects and non-pharmacologic comfort measures Outcome: Progressing   Problem: Health Behavior/Discharge Planning: Goal: Ability to manage health-related needs will improve Outcome: Progressing   

## 2019-06-12 NOTE — H&P (Signed)
History and Physical    [name] KZS:[id number] DOB: 01/01/1951 DOA: 06/12/2019  PCP: [place], Sarah T, MD Patient coming from: Home  Chief Complaint: Near syncope, vomiting  HPI: [name] is a 69 y.o. female with medical history significant of esophageal cancer on chemo and radiation, CAD, asthma, COPD, GERD, hypertension, hypothyroidism presenting to the ED with complaints of near syncope and vomiting.  Upon EMS arrival, patient was found to be in A. fib with RVR and was given 20 mg of Cardizem.  She was also given aspirin 324 mg and Zofran.    Patient states she was not feeling well all day yesterday.  She did not eat anything.  Around 7 PM in the evening while sitting down she felt nauseous and then passed out.  When she woke up there was vomit all over her.  She then stayed at home and a few hours later she again felt lightheaded and started vomiting.  Family called EMS to bring her to the hospital.  She felt her heart racing.  Reports having left lower chest/upper abdominal and epigastric pins-and-needles like sensation since she was diagnosed with cancer.    Reports subjective fevers and chills.  States she has vomited previously as well after receiving chemotherapy.  Denies diarrhea.  Denies history of A. fib but states for the past few months she has noticed that sometimes while laying down in bed she feels her heart racing.  States she has esophageal cancer for which she is currently receiving chemo and radiation at the cancer center in [place].  ED Course: Afebrile.  Initially in A. fib with RVR with rate up to 160s to 170s.  Hypotensive with systolic in the 73U.  Not hypoxic.  Labs showing no leukocytosis.  Lactic acid 2.5 >2.9.  Hemoglobin 11.9, was 14.0 in December 2020.  Platelet count normal.  Corrected sodium 131.  BUN 27, creatinine 1.4.  Creatinine was previously 1.0.  BNP normal.  TSH normal.  Free T4 slightly elevated at 1.59.  Initial high-sensitivity troponin 30,  repeat 46.  BNP normal.  SARS-CoV-2 PCR test negative.  Blood culture x2 pending.  UA not suggestive of infection.  CT angiogram chest negative for PE.  Showing progression in the regular esophageal wall thickening extending from the mid esophagus to the gastroesophageal junction, patient with known primary esophageal malignancy.  Patchy opacity in the left lower greater than right lower lobe dependently  Differential considerations include atelectasis, pneumonia, aspiration, or potentially postradiation change.   Blood pressure improved and converted back to sinus rhythm after 3 L fluid boluses.  Patient was given vancomycin and Zosyn.  Review of Systems:  All systems reviewed and apart from history of presenting illness, are negative.  Past Medical History:  Diagnosis Date  . Asthma   . COPD (chronic obstructive pulmonary disease) ([place])   . Diastolic dysfunction   . Diverticulosis   . Fatty liver   . GERD without esophagitis   . Hx of Clostridium difficile infection   . Hx of colonic polyps   . Hx of small bowel obstruction   . Hypertension, benign   . Hypothyroidism   . Irritable bowel syndrome   . Lumbar herniated disc   . Mixed hyperlipidemia   . Osteoarthritis     Past Surgical History:  Procedure Laterality Date  . ABDOMINAL HYSTERECTOMY  2002  . APPENDECTOMY  2005  . CARPAL TUNNEL RELEASE Bilateral   . LEFT HEART CATH AND CORONARY ANGIOGRAPHY N/A 12/28/2018  Procedure: LEFT HEART CATH AND CORONARY ANGIOGRAPHY;  Surgeon: Belva Crome, MD;  Location: [facility];  Service: Cardiovascular;  Laterality: N/A;  . SMALL INTESTINE SURGERY  11/26/2012   Bowel obstruction, Dr. Pauletta Browns  . TUBAL LIGATION  1980     reports that she has been smoking. She has been smoking about 1.00 pack per day. She has never used smokeless tobacco. She reports previous alcohol use. She reports previous drug use.  Allergies  Allergen Reactions  . Ether Nausea And Vomiting  . Nickel  Other (See Comments)    infection  . Latex Rash    Family History  Problem Relation Age of Onset  . Ovarian cancer Sister   . Lung cancer Brother   . Stomach cancer Maternal Grandmother   . Colon cancer Daughter     Prior to Admission medications   Medication Sig Start Date End Date Taking? Authorizing Provider  aspirin EC 81 MG tablet Take 1 tablet (81 mg total) by mouth daily. 12/22/18   Tobb, Kardie, DO  BREO ELLIPTA 100-25 MCG/INH AEPB Inhale 1 puff into the lungs every evening.  08/18/18   [provider]  calcium carbonate (TUMS - DOSED IN MG ELEMENTAL CALCIUM) 500 MG chewable tablet Chew 1 tablet by mouth daily.    [provider]  Eluxadoline (VIBERZI) 75 MG TABS Take 75 mg by mouth 2 (two) times daily as needed (IBS).     [provider]  EPINEPHrine 0.3 mg/0.3 mL IJ SOAJ injection Inject 0.3 mg into the muscle as needed for anaphylaxis. 11/14/18   [provider]  fluticasone (FLONASE) 50 MCG/ACT nasal spray Place 1 spray into both nostrils 2 (two) times daily.    [provider]  gabapentin (NEURONTIN) 300 MG capsule Take 300-600 mg by mouth See admin instructions. Take 1 capsule (300 mg) by mouth in the morning & take 2 capsules (600 mg) by mouth in the evening. 08/19/18   [provider]  isosorbide mononitrate (IMDUR) 30 MG 24 hr tablet Take 1 tablet (30 mg total) by mouth daily. Please call office to schedule follow up visit. 04/27/19   Tobb, Kardie, DO  levothyroxine (SYNTHROID) 75 MCG tablet Take 75 mcg by mouth daily before breakfast.  08/23/18   [provider]  lisinopril-hydrochlorothiazide (ZESTORETIC) 10-12.5 MG tablet Take 1 tablet by mouth daily.  08/19/18   [provider]  pantoprazole (PROTONIX) 40 MG tablet Take 40 mg by mouth daily.  10/14/18   [provider]  rifaximin (XIFAXAN) 550 MG TABS tablet Take 550 mg by mouth See admin instructions. Take 1 tablet (550 mg) by mouth twice daily  for 2 weeks, hold for 4 months then resume cycle    [provider]  rosuvastatin (CRESTOR) 40 MG tablet Take 1 tablet (40 mg total) by mouth daily. Please call office to schedule follow up visit. 04/27/19   Tobb, Kardie, DO  TREMFYA 100 MG/ML SOSY Inject 100 mg into the skin See admin instructions. Every 2 months 10/21/18   [provider]    Physical Exam: Vitals:   06/12/19 0521 06/12/19 0541 06/12/19 0725 06/12/19 0730  BP:  (!) 93/53 (!) 85/49 (!) 96/47  Pulse:  95 82 83  Resp:  16 18 15   Temp: 99.4 F (37.4 C) 98.7 F (37.1 C) 98.5 F (36.9 C)   TempSrc: Oral Oral Oral   SpO2:  100% 100% 100%  Weight:  83 kg    Height:  5\' 2"  (  1.575 m)      Physical Exam  Constitutional: She is oriented to person, place, and time. She appears well-developed and well-nourished. No distress.  HENT:  Head: Normocephalic.  Dry mucous membranes  Eyes: Right eye exhibits no discharge. Left eye exhibits no discharge.  Cardiovascular: Normal rate, regular rhythm and intact distal pulses.  Pulmonary/Chest: Effort normal and breath sounds normal. No respiratory distress. She has no wheezes. She has no rales.  Abdominal: Soft. Bowel sounds are normal. She exhibits no distension. There is abdominal tenderness. There is no rebound and no guarding.  Mild epigastric tenderness  Musculoskeletal:        General: No edema.     Cervical back: Neck supple.  Neurological: She is alert and oriented to person, place, and time.  Skin: Skin is warm and dry. She is not diaphoretic.    Labs on Admission: I have personally reviewed following labs and imaging studies  CBC: Recent Labs  Lab 06/12/19 0020 06/12/19 0034  WBC 9.2  --   NEUTROABS 8.3*  --   HGB 11.9* 11.9*  HCT 36.2 35.0*  MCV 91.2  --   PLT 316  --    Basic Metabolic Panel: Recent Labs  Lab 06/12/19 0020 06/12/19 0034 06/12/19 0610  NA 130* 131* 133*  K 3.8 3.4* 3.8  CL 92* 92* 101  CO2 22  --  24  GLUCOSE 180*  176* 135*  BUN 27* 26* 18  CREATININE 1.46* 1.40* 1.19*  CALCIUM 9.1  --  8.0*   GFR: Estimated Creatinine Clearance: 44.6 mL/min (A) (by C-G formula based on SCr of 1.19 mg/dL (H)). Liver Function Tests: Recent Labs  Lab 06/12/19 0020  AST 13*  ALT 11  ALKPHOS 41  BILITOT 0.6  PROT 6.7  ALBUMIN 3.3*   No results for input(s): LIPASE, AMYLASE in the last 168 hours. No results for input(s): AMMONIA in the last 168 hours. Coagulation Profile: Recent Labs  Lab 06/12/19 0020  INR 1.0   Cardiac Enzymes: No results for input(s): CKTOTAL, CKMB, CKMBINDEX, TROPONINI in the last 168 hours. BNP (last 3 results) No results for input(s): PROBNP in the last 8760 hours. HbA1C: No results for input(s): HGBA1C in the last 72 hours. CBG: No results for input(s): GLUCAP in the last 168 hours. Lipid Profile: No results for input(s): CHOL, HDL, LDLCALC, TRIG, CHOLHDL, LDLDIRECT in the last 72 hours. Thyroid Function Tests: Recent Labs    06/12/19 0020  TSH 2.153  FREET4 1.59*   Anemia Panel: No results for input(s): VITAMINB12, FOLATE, FERRITIN, TIBC, IRON, RETICCTPCT in the last 72 hours. Urine analysis:    Component Value Date/Time   COLORURINE YELLOW 06/12/2019 0423   APPEARANCEUR CLEAR 06/12/2019 0423   LABSPEC 1.025 06/12/2019 0423   PHURINE 6.0 06/12/2019 0423   GLUCOSEU NEGATIVE 06/12/2019 0423   HGBUR NEGATIVE 06/12/2019 0423   BILIRUBINUR NEGATIVE 06/12/2019 0423   KETONESUR NEGATIVE 06/12/2019 0423   PROTEINUR 30 (A) 06/12/2019 0423   UROBILINOGEN 1.0 09/20/2008 1409   NITRITE NEGATIVE 06/12/2019 0423   LEUKOCYTESUR NEGATIVE 06/12/2019 0423    Radiological Exams on Admission: CT Angio Chest PE W and/or Wo Contrast  Result Date: 06/12/2019 CLINICAL DATA:  Shortness of breath. History of esophageal cancer, active chemotherapy and radiation. EXAM: CT ANGIOGRAPHY CHEST WITH CONTRAST TECHNIQUE: Multidetector CT imaging of the chest was performed using the standard  protocol during bolus administration of intravenous contrast. Multiplanar CT image reconstructions and MIPs were obtained to evaluate the vascular anatomy.  CONTRAST:  63 mL OMNIPAQUE IOHEXOL 350 MG/ML SOLN COMPARISON:  Radiograph earlier today.  Chest CTA 04/03/2019 FINDINGS: Cardiovascular: There are no filling defects within the pulmonary arteries to suggest pulmonary embolus. Aortic atherosclerosis without dissection. No aortic aneurysm. Heart is normal in size. There are coronary artery calcifications. No pericardial effusion. Right chest port in place. Mediastinum/Nodes: Significant increase in irregular esophageal wall thickening extending from the mid esophagus to the gastroesophageal junction from the level of the carina distally. 11 mm right hilar node, nonspecific. No enlarged mediastinal lymph nodes. No visualized thyroid nodule. Lungs/Pleura: Patchy opacity in the left lower greater than right lower lobe dependently. Mild central bronchial thickening. Mild underlying emphysema. There is a 3 mm small perifissural left upper lobe pulmonary nodule, series 7, image 21. No pleural fluid. Upper Abdomen: No acute findings. No free fluid. No adrenal nodule. Musculoskeletal: Degenerative change in the spine. There are no acute or suspicious osseous abnormalities. Review of the MIP images confirms the above findings. IMPRESSION: 1. No pulmonary embolus. 2. Progression in irregular esophageal wall thickening extending from the mid esophagus to the gastroesophageal junction, patient with known primary esophageal malignancy. 3. Patchy opacity in the left lower greater than right lower lobe dependently. Differential considerations include atelectasis, pneumonia, aspiration or potentially post radiation change. 4. Prominent right hilar node measuring 11 mm, nonspecific. Tiny 3 mm left upper lobe pulmonary nodule, also seen on prior exams. Continued follow-up recommended. Aortic Atherosclerosis (ICD10-I70.0) and  Emphysema (ICD10-J43.9). Electronically Signed   By: Keith Rake M.D.   On: 06/12/2019 02:04   DG Chest Portable 1 View  Result Date: 06/12/2019 CLINICAL DATA:  Initial evaluation for acute shortness of breath. EXAM: PORTABLE CHEST 1 VIEW COMPARISON:  Prior radiograph from 05/27/2019. FINDINGS: Transverse heart size within normal limits. Mediastinal silhouette normal. Aortic atherosclerosis. Right-sided Port-A-Cath in place with tip overlying the mid SVC. Defibrillator pads overlie the left chest. Lungs mildly hypoinflated. Minimal streaky left basilar subsegmental atelectasis. No focal infiltrates. No edema or pleural effusion. No pneumothorax. Visualized osseous structures within normal limits. IMPRESSION: 1. Minimal streaky left basilar subsegmental atelectasis. 2. No other active cardiopulmonary disease. 3.  Aortic Atherosclerosis (ICD10-I70.0). Electronically Signed   By: Jeannine Boga M.D.   On: 06/12/2019 00:40    EKG: Independently reviewed.  Initial EKG showing A. fib with RVR.  LBBB similar to prior tracing.  Repeat EKG showing conversion to sinus rhythm.  Assessment/Plan Principal Problem:   Severe sepsis (HCC) Active Problems:   Aspiration pneumonia (HCC)   Atrial fibrillation with rapid ventricular response (HCC)   AKI (acute kidney injury) (HCC)   Nausea & vomiting   Severe sepsis secondary to suspected aspiration pneumonia: CT angiogram showing patchy bilateral lower lobe opacities.  Suspect this is due to aspiration as she has been vomiting.  Covid swab negative.  Presented with hypotension and lactic acidosis.  Not hypoxic. -Received 3 L IV fluid boluses and continues to be hypotensive.  Her mentation is normal.  Will order additional 1 L fluid bolus as she appears dehydrated.  Admit to stepdown unit and monitor blood pressure closely.  If blood pressure continues to be low despite aggressive fluid resuscitation, will need ICU admission for vasopressor.  Continue  antibiotic with broad-spectrum antibiotics at this time.  Trend lactic acid.  Blood culture x2 pending.  Keep n.p.o., aspiration precautions, and SLP eval in a.m.  New onset A. fib with RVR: Rate was initially in the 160s to 170s.  Received Cardizem by EMS and was  hypotensive on arrival.  Now converted back to sinus rhythm after receiving several fluid boluses.  CT angiogram negative for PE.  Free T4 slightly elevated but TSH normal.  A. fib likely precipitated by underlying infection/severe sepsis.  However, patient does report experiencing heart palpitations for several months.  -Given CHA2DS2VASc 4, she is at risk for stroke/ systemic embolism. She will benefit from being started on chronic anticoagulation. Please discuss with cardiology in am. Will order echocardiogram.  Anemia: Likely related to chemotherapy.  Hemoglobin 11.9, was 14 in December 2020.  Patient is not endorsing any symptoms of GI bleed at present. -Continue to monitor and order additional work-up if anemia worsens  Mild hyponatremia: Sodium 131.  Likely due to vomiting/decreased p.o. intake. -Received IV fluid boluses.  Repeat BMP to check sodium level.  AKI: Likely prerenal due to dehydration/severe sepsis/hypotension. BUN 27, creatinine 1.4.  Creatinine was previously 1.0.  -IV fluid hydration and continue to monitor renal function.  Monitor urine output.  Avoid nephrotoxic agents.  Hold home lisinopril and hydrochlorothiazide.  CAD: High-sensitivity troponin mildly elevated (30 >46) likely due to demand ischemia in the setting of Afib with RVR and severe sepsis.  Chest pain appears atypical- endorsing chronic pins-and-needles sensation in her left lower chest/upper abdomen and epigastrium since she has been diagnosed with esophageal cancer.  Appears comfortable on exam. -Cardiac monitoring, trend troponin  Esophageal cancer currently on chemo and radiation: Patient is followed by oncology in [place].  CT showing progression  in the regular esophageal wall thickening extending from the mid esophagus to the gastroesophageal junction. -Please ensure outpatient oncology follow-up.  Nausea/emesis: Possibly related to acute illness/severe sepsis/A. fib with RVR.  Also currently on chemotherapy for esophageal cancer.  She has mild epigastric tenderness on abdominal exam but no peritoneal signs.  Labs without elevation of LFTs.  She is no longer vomiting. -Check lipase.  Antiemetic as needed.  Pharmacy med rec pending.  DVT prophylaxis: Lovenox Code Status: Full code Family Communication: No family available at this time. Disposition Plan: Status is: Inpatient  Remains inpatient appropriate because:Hemodynamically unstable, IV treatments appropriate due to intensity of illness or inability to take PO and Inpatient level of care appropriate due to severity of illness   Dispo: The patient is from: Home              Anticipated d/c is to: Home              Anticipated d/c date is: > 3 days              Patient currently is not medically stable to d/c.  The medical decision making on this patient was of high complexity and the patient is at high risk for clinical deterioration, therefore this is a level 3 visit.  Shela Leff MD [facility]  If 7PM-7AM, please contact night-coverage [email or web address]  06/12/2019, 7:40 AM

## 2019-06-13 ENCOUNTER — Inpatient Hospital Stay (HOSPITAL_COMMUNITY): Payer: Medicare Other

## 2019-06-13 LAB — ECHOCARDIOGRAM COMPLETE
Height: 62 in
Weight: 2926.4 oz

## 2019-06-13 LAB — CBC
HCT: 30.6 % — ABNORMAL LOW (ref 36.0–46.0)
Hemoglobin: 10 g/dL — ABNORMAL LOW (ref 12.0–15.0)
MCH: 29.5 pg (ref 26.0–34.0)
MCHC: 32.7 g/dL (ref 30.0–36.0)
MCV: 90.3 fL (ref 80.0–100.0)
Platelets: 227 10*3/uL (ref 150–400)
RBC: 3.39 MIL/uL — ABNORMAL LOW (ref 3.87–5.11)
RDW: 12.8 % (ref 11.5–15.5)
WBC: 3.6 10*3/uL — ABNORMAL LOW (ref 4.0–10.5)
nRBC: 0 % (ref 0.0–0.2)

## 2019-06-13 LAB — BASIC METABOLIC PANEL
Anion gap: 9 (ref 5–15)
BUN: 9 mg/dL (ref 8–23)
CO2: 23 mmol/L (ref 22–32)
Calcium: 8.2 mg/dL — ABNORMAL LOW (ref 8.9–10.3)
Chloride: 107 mmol/L (ref 98–111)
Creatinine, Ser: 0.96 mg/dL (ref 0.44–1.00)
GFR calc Af Amer: >60 mL/min (ref >60)
GFR calc non Af Amer: >60 mL/min (ref >60)
Glucose, Bld: 107 mg/dL — ABNORMAL HIGH (ref 70–99)
Potassium: 3.6 mmol/L (ref 3.5–5.1)
Sodium: 139 mmol/L (ref 135–145)

## 2019-06-13 LAB — PROCALCITONIN: Procalcitonin: <0.1 ng/mL

## 2019-06-13 LAB — URINE CULTURE: Culture: 10000 — AB

## 2019-06-13 MED ORDER — AMOXICILLIN-POT CLAVULANATE 875-125 MG PO TABS
1.0000 | ORAL_TABLET | Freq: Two times a day (BID) | ORAL | Status: DC
  Administered 2019-06-13 – 2019-06-14 (×3): 1 via ORAL
  Filled 2019-06-13 (×3): qty 1

## 2019-06-13 MED ORDER — ADULT MULTIVITAMIN W/MINERALS CH
1.0000 | ORAL_TABLET | Freq: Every day | ORAL | Status: DC
  Administered 2019-06-13 – 2019-06-14 (×2): 1 via ORAL
  Filled 2019-06-13 (×2): qty 1

## 2019-06-13 MED ORDER — FREE WATER
200.0000 mL | Freq: Four times a day (QID) | Status: DC
  Administered 2019-06-13 – 2019-06-14 (×3): 200 mL

## 2019-06-13 MED ORDER — PRO-STAT SUGAR FREE PO LIQD
30.0000 mL | Freq: Two times a day (BID) | ORAL | Status: DC
  Administered 2019-06-13 – 2019-06-14 (×3): 30 mL
  Filled 2019-06-13 (×3): qty 30

## 2019-06-13 MED ORDER — SACCHAROMYCES BOULARDII 250 MG PO CAPS
250.0000 mg | ORAL_CAPSULE | Freq: Two times a day (BID) | ORAL | Status: DC
  Administered 2019-06-13 – 2019-06-14 (×3): 250 mg via ORAL
  Filled 2019-06-13 (×3): qty 1

## 2019-06-13 NOTE — Evaluation (Signed)
Physical Therapy Evaluation Patient Details Name: [name] MRN: [id number] DOB: [birth year] Today's Date: 06/13/2019   History of Present Illness  69 y.o. female with medical history significant of esophageal cancer on chemo and radiation, CAD, asthma, COPD, GERD, hypertension, hypothyroidism presenting to the ED with complaints of near syncope and vomiting.  Upon EMS arrival, patient was found to be in A. fib with RVR. Admitted 06/12/19 for treatment of sepsis likely due to aspiration PNA.   Clinical Impression  PTA pt living with boyfriend in mobile home with 4 steps to enter. Pt reports she needs assist for bed mobility, and sit>stand transfers due to PEG tube irritation with forward flexion. Pt ambulates without AD independently. Pt is currently mod I for transfers and ambulation of 350 feet without AD. Pt is very close to her baseline so PT does not recommend any additional PT at d/c however PT will continue to follow acutely.     Follow Up Recommendations No PT follow up;Supervision for mobility/OOB    Equipment Recommendations  None recommended by PT       Precautions / Restrictions Precautions Precautions: None Restrictions Weight Bearing Restrictions: No      Mobility  Bed Mobility               General bed mobility comments: OOB in recliner  Transfers Overall transfer level: Modified independent Equipment used: None             General transfer comment: increased effort  Ambulation/Gait Ambulation/Gait assistance: Modified independent (Device/Increase time) Gait Distance (Feet): 350 Feet Assistive device: None Gait Pattern/deviations: Step-to pattern;Decreased step length - right;Decreased step length - left Gait velocity: slowed Gait velocity interpretation: 1.31 - 2.62 ft/sec, indicative of limited community ambulator General Gait Details: slow, steady gait      Balance Overall balance assessment: Mild deficits observed, not formally tested                                           Pertinent Vitals/Pain Pain Assessment: No/denies pain Faces Pain Scale: No hurt    Home Living Family/patient expects to be discharged to:: Private residence Living Arrangements: Spouse/significant other Available Help at Discharge: Friend(s);Available 24 hours/day Type of Home: Apartment Home Access: Stairs to enter Entrance Stairs-Rails: Can reach both Entrance Stairs-Number of Steps: 4 Home Layout: One level Home Equipment: Walker - 2 wheels;Cane - single point;Grab bars - toilet;Grab bars - tub/shower;Shower seat - built in      Prior Function Level of Independence: Needs assistance   Gait / Transfers Assistance Needed: requires assist for bed mobility and transfers due to PEG, independent in limited community ambulation without AD  ADL's / Homemaking Assistance Needed: independent in ADLs, boyfriend assists with iADLs           Extremity/Trunk Assessment   Upper Extremity Assessment Upper Extremity Assessment: Overall WFL for tasks assessed    Lower Extremity Assessment Lower Extremity Assessment: Overall WFL for tasks assessed       Communication   Communication: No difficulties  Cognition Arousal/Alertness: Awake/alert Behavior During Therapy: WFL for tasks assessed/performed Overall Cognitive Status: Within Functional Limits for tasks assessed                                        General Comments  General comments (skin integrity, edema, etc.): HR at rest 82 bpm, max HR with ambulation 120 bpm        Assessment/Plan    PT Assessment Patient needs continued PT services  PT Problem List Decreased activity tolerance;Cardiopulmonary status limiting activity       PT Treatment Interventions DME instruction;Gait training;Functional mobility training;Therapeutic activities;Therapeutic exercise;Balance training;Cognitive remediation;Patient/family education    PT Goals (Current  goals can be found in the Care Plan section)  Acute Rehab PT Goals Patient Stated Goal: go home soon PT Goal Formulation: With patient Time For Goal Achievement: 06/27/19 Potential to Achieve Goals: Good    Frequency Min 3X/week    AM-PAC PT "6 Clicks" Mobility  Outcome Measure Help needed turning from your back to your side while in a flat bed without using bedrails?: A Lot Help needed moving from lying on your back to sitting on the side of a flat bed without using bedrails?: A Little Help needed moving to and from a bed to a chair (including a wheelchair)?: None Help needed standing up from a chair using your arms (e.g., wheelchair or bedside chair)?: A Little Help needed to walk in hospital room?: None Help needed climbing 3-5 steps with a railing? : A Little 6 Click Score: 19    End of Session   Activity Tolerance: Patient tolerated treatment well Patient left: in chair;with call bell/phone within reach Nurse Communication: Mobility status PT Visit Diagnosis: Muscle weakness (generalized) (M62.81)    Time: 0962-8366 PT Time Calculation (min) (ACUTE ONLY): 20 min   Charges:   PT Evaluation $PT Eval Moderate Complexity: 1 Mod          Lyall Faciane B. Migdalia Dk PT, DPT Acute Rehabilitation Services Pager [phone] Office [phone]   [place] 06/13/2019, 4:11 PM

## 2019-06-13 NOTE — Progress Notes (Signed)
[facility] Progress Note  Patient: [name]    ENI:[id number]  DOA: 06/12/2019     Date of Service: the patient was seen and examined on 06/13/2019  Chief Complaint  Patient presents with  . Near Syncope   Brief hospital course: [name] is a 69 y.o. female with medical history significant of esophageal cancer on chemo and radiation, CAD, asthma, COPD, GERD, hypertension, hypothyroidism presenting to the ED with complaints of near syncope and vomiting.  Upon EMS arrival, patient was found to be in A. fib with RVR and was given 20 mg of Cardizem.  She was also given aspirin 324 mg and Zofran.    Patient states she was not feeling well all day yesterday.  She did not eat anything.  Around 7 PM in the evening while sitting down she felt nauseous and then passed out.  When she woke up there was vomit all over her.  She then stayed at home and a few hours later she again felt lightheaded and started vomiting.  Family called EMS to bring her to the hospital.  She felt her heart racing.  Reports having left lower chest/upper abdominal and epigastric pins-and-needles like sensation since she was diagnosed with cancer.    Reports subjective fevers and chills.  States she has vomited previously as well after receiving chemotherapy.  Denies diarrhea.  Denies history of A. fib but states for the past few months she has noticed that sometimes while laying down in bed she feels her heart racing.  States she has esophageal cancer for which she is currently receiving chemo and radiation at the cancer center in [place]. Currently plan is continue to monitor cultures and continue IV antibiotics.  Assessment and Plan: 1.  Hypovolemia. Sepsis-like picture on admission with suspected aspiration pneumonia. Improved after fourth bolus. Continue IV LR for now. Continue antibiotics.  MRSA PCR negative discontinue vancomycin. PEG tube site appears okay. X-ray shows PEG tube in appropriate  position. Speech therapy recommends dysphagia to thin liquid diet. Carafate and lidocaine as needed for chest pain while swallowing. Monitor culture.  So far negative for 1 day.  2 A. fib with RVR.  Currently normal sinus rhythm. CHA2DS2VASc 4, will discuss anticoagulation although risk of bleeding is also high given her GI malignancy. Monitor.  3. AKI. Hyponatremia Improving with IV fluids.  Continue to monitor.  4.  Elevated troponins. Demand ischemia. Monitor.  No WMA.  5.  Esophageal malignancy as per chemo and radiation. Follows up with oncology in [place]. CT scan does show evidence of progression. Outpatient follow-up with oncology.  6.  Dietary consult for diet education.  Patient risk for dehydration Body mass index is 33.45 kg/m.  Nutrition Problem: Increased nutrient needs Etiology: cancer and cancer related treatments Interventions: Interventions: Magic cup, Prostat      Diet: Regular diet, DVT Prophylaxis: Subcutaneous Lovenox   Advance goals of care discussion: Full code  Family Communication: no family was present at bedside, at the time of interview.   Disposition:  Status is: Inpatient  Remains inpatient appropriate because:IV treatments appropriate due to intensity of illness or inability to take PO   Dispo: The patient is from: Home              Anticipated d/c is to: Home              Anticipated d/c date is: 1 day              Patient currently  is not medically stable to d/c.        Subjective: Continues to have chest pain secondary to esophageal malignancy which is making difficulties in swallowing.  No nausea no vomiting.  Physical Exam:  General: Appear in mild distress, no Rash; Oral Mucosa Clear, moist. no Abnormal Neck Mass Or lumps, Conjunctiva normal  Cardiovascular: S1 and S2 Present, no Murmur, Respiratory: good respiratory effort, Bilateral Air entry present and Clear to Auscultation, no Crackles, no wheezes Abdomen:  Bowel Sound present, Soft and no tenderness Extremities: no Pedal edema, no calf tenderness Neurology: alert and oriented to time, place, and person affect appropriate. no new focal deficit Gait not checked due to patient safety concerns  Vitals:   06/13/19 0749 06/13/19 1154 06/13/19 1250 06/13/19 1624  BP: 136/60 (!) 126/57  (!) 132/57  Pulse: 82 88  88  Resp: 16 18  17   Temp: 98.7 F (37.1 C) (!) 101.2 F (38.4 C) 99.2 F (37.3 C) 99.2 F (37.3 C)  TempSrc: Oral Oral Oral Oral  SpO2: 99% 100%  97%  Weight:      Height:        Intake/Output Summary (Last 24 hours) at 06/13/2019 1825 Last data filed at 06/13/2019 1500 Gross per 24 hour  Intake 1095.5 ml  Output 2350 ml  Net -1254.5 ml   Filed Weights   06/12/19 0026 06/12/19 0541  Weight: 79.4 kg 83 kg    Data Reviewed: I have personally reviewed and interpreted daily labs, tele strips, imagings as discussed above. I reviewed all nursing notes, pharmacy notes, vitals, pertinent old records I have discussed plan of care as described above with RN and patient/family.  CBC: Recent Labs  Lab 06/12/19 0020 06/12/19 0034 06/13/19 0329  WBC 9.2  --  3.6*  NEUTROABS 8.3*  --   --   HGB 11.9* 11.9* 10.0*  HCT 36.2 35.0* 30.6*  MCV 91.2  --  90.3  PLT 316  --  867   Basic Metabolic Panel: Recent Labs  Lab 06/12/19 0020 06/12/19 0034 06/12/19 0610 06/13/19 0329  NA 130* 131* 133* 139  K 3.8 3.4* 3.8 3.6  CL 92* 92* 101 107  CO2 22  --  24 23  GLUCOSE 180* 176* 135* 107*  BUN 27* 26* 18 9  CREATININE 1.46* 1.40* 1.19* 0.96  CALCIUM 9.1  --  8.0* 8.2*    Studies: DG Swallowing Func-Speech Pathology  Result Date: 06/13/2019 Objective Swallowing Evaluation: Type of Study: Bedside Swallow Evaluation  Patient Details Name: [name] MRN: [id number] Date of Birth: 04/30/1950 Today's Date: 06/13/2019 Time: SLP Start Time (ACUTE ONLY): 7124 -SLP Stop Time (ACUTE ONLY): 1316 SLP Time Calculation (min) (ACUTE ONLY):  11 min Past Medical History: Past Medical History: Diagnosis Date . Asthma  . COPD (chronic obstructive pulmonary disease) ([place])  . Diastolic dysfunction  . Diverticulosis  . Fatty liver  . GERD without esophagitis  . Hx of Clostridium difficile infection  . Hx of colonic polyps  . Hx of small bowel obstruction  . Hypertension, benign  . Hypothyroidism  . Irritable bowel syndrome  . Lumbar herniated disc  . Mixed hyperlipidemia  . Osteoarthritis  Past Surgical History: Past Surgical History: Procedure Laterality Date . ABDOMINAL HYSTERECTOMY  2002 . APPENDECTOMY  2005 . CARPAL TUNNEL RELEASE Bilateral  . LEFT HEART CATH AND CORONARY ANGIOGRAPHY N/A 12/28/2018  Procedure: LEFT HEART CATH AND CORONARY ANGIOGRAPHY;  Surgeon: Belva Crome, MD;  Location: [facility];  Service: Cardiovascular;  Laterality: N/A; . SMALL INTESTINE SURGERY  11/26/2012  Bowel obstruction, Dr. Pauletta Browns . TUBAL LIGATION  1980 HPI: Fay Bagg is a 69 y.o. female with medical history significant of esophageal cancer on chemo and radiation, CAD, asthma, COPD, GERD, hypertension, hypothyroidism presenting to the ED with complaints of near syncope and vomiting.  Upon EMS arrival, patient was found to be in A. fib with RVR.  Pt has a PEG as a preventative measure, but she currently only uses it for one medication and she gets all other nutrition PO.  Chest CT on 06/12/19 reported: "Patchy opacity in the left lower greater than right lower lobe dependently. Differential considerations include atelectasis, pneumonia, aspiration or potentially post radiation change."  Subjective: Pt was alert Assessment / Plan / Recommendation CHL IP CLINICAL IMPRESSIONS 06/13/2019 Clinical Impression Pt was seen in radiology suite for modified barium swallow study. Trials of puree solids, mechanical soft (chopped) solids, a 2mm barium tablet, and thin liquids via cup and straw were administered. Pt requested that regular texture solids be deferred due to  her edentulous status. Pt's oropharyngeal swallow mechanism was within functional limits. A single cough was demonstrated between swallows but no instances of penetration or aspiration were noted and pt denied sensation of aspiration. It is recommended that the current diet of dysphagia 2 solids with thin liquids be continued at this time. Pt reported reduced coughing with avoidance of straws and it is therefore recommended that this precaution be continued. Further skilled SLP services are not clinically indicated at this time.  SLP Visit Diagnosis Dysphagia, unspecified (R13.10) Attention and concentration deficit following -- Frontal lobe and executive function deficit following -- Impact on safety and function Mild aspiration risk   CHL IP TREATMENT RECOMMENDATION 06/13/2019 Treatment Recommendations No treatment recommended at this time   Prognosis 06/13/2019 Prognosis for Safe Diet Advancement Good Barriers to Reach Goals -- Barriers/Prognosis Comment -- CHL IP DIET RECOMMENDATION 06/13/2019 SLP Diet Recommendations Dysphagia 2 (Fine chop) solids;Thin liquid Liquid Administration via Cup;No straw Medication Administration Whole meds with liquid Compensations Slow rate;Small sips/bites;Follow solids with liquid Postural Changes --   No flowsheet data found.  CHL IP FOLLOW UP RECOMMENDATIONS 06/13/2019 Follow up Recommendations None   CHL IP FREQUENCY AND DURATION 06/12/2019 Speech Therapy Frequency (ACUTE ONLY) min 2x/week Treatment Duration 2 weeks      CHL IP ORAL PHASE 06/13/2019 Oral Phase WFL Oral - Pudding Teaspoon -- Oral - Pudding Cup -- Oral - Honey Teaspoon -- Oral - Honey Cup -- Oral - Nectar Teaspoon -- Oral - Nectar Cup -- Oral - Nectar Straw -- Oral - Thin Teaspoon -- Oral - Thin Cup -- Oral - Thin Straw -- Oral - Puree -- Oral - Mech Soft -- Oral - Regular -- Oral - Multi-Consistency -- Oral - Pill -- Oral Phase - Comment --  CHL IP PHARYNGEAL PHASE 06/13/2019 Pharyngeal Phase WFL Pharyngeal- Pudding Teaspoon  -- Pharyngeal -- Pharyngeal- Pudding Cup -- Pharyngeal -- Pharyngeal- Honey Teaspoon -- Pharyngeal -- Pharyngeal- Honey Cup -- Pharyngeal -- Pharyngeal- Nectar Teaspoon -- Pharyngeal -- Pharyngeal- Nectar Cup -- Pharyngeal -- Pharyngeal- Nectar Straw -- Pharyngeal -- Pharyngeal- Thin Teaspoon -- Pharyngeal -- Pharyngeal- Thin Cup -- Pharyngeal -- Pharyngeal- Thin Straw -- Pharyngeal -- Pharyngeal- Puree -- Pharyngeal -- Pharyngeal- Mechanical Soft -- Pharyngeal -- Pharyngeal- Regular -- Pharyngeal -- Pharyngeal- Multi-consistency -- Pharyngeal -- Pharyngeal- Pill -- Pharyngeal -- Pharyngeal Comment --  CHL IP CERVICAL ESOPHAGEAL PHASE 06/13/2019 Cervical Esophageal Phase [facility] Pudding  Teaspoon -- Pudding Cup -- Honey Teaspoon -- Honey Cup -- Nectar Teaspoon -- Nectar Cup -- Nectar Straw -- Thin Teaspoon -- Thin Cup -- Thin Straw -- Puree -- Mechanical Soft -- Regular -- Multi-consistency -- Pill -- Cervical Esophageal Comment -- Shanika I. Hardin Negus, [place], [place] Office number [phone] Pager [phone] Horton Marshall 06/13/2019, 4:03 PM               Scheduled Meds: . amoxicillin-clavulanate  1 tablet Oral Q12H  . aspirin EC  81 mg Oral Daily  . enoxaparin (LOVENOX) injection  40 mg Subcutaneous Q24H  . famotidine  20 mg Oral BID  . feeding supplement (PRO-STAT SUGAR FREE 64)  30 mL Per Tube BID  . fluticasone  2 spray Each Nare Daily  . fluticasone furoate-vilanterol  1 puff Inhalation QPM  . free water  200 mL Per Tube Q6H  . levothyroxine  75 mcg Oral Q0600  . multivitamin with minerals  1 tablet Oral Daily  . pantoprazole  40 mg Oral Daily  . rosuvastatin  40 mg Oral Daily  . saccharomyces boulardii  250 mg Oral BID  . sucralfate  1 g Oral TID WC & HS   Continuous Infusions: . sodium chloride 1,000 mL (06/13/19 1823)   PRN Meds: acetaminophen **OR** acetaminophen, lidocaine, ondansetron (ZOFRAN) IV, oxyCODONE  Time spent: 35 minutes  Author: Berle Mull,  MD [facility] 06/13/2019 6:25 PM  To reach On-call, see care teams to locate the attending and reach out via [email or web address]. Between 7PM-7AM, please contact night-coverage If you still have difficulty reaching the attending provider, please page the [facility] (Director on Call) for [facility] on amion for assistance.

## 2019-06-13 NOTE — Progress Notes (Signed)
  Speech Language Pathology Treatment: Dysphagia  Patient Details Name: Eduardo Wurth MRN: [id number] DOB: 1950-04-30 Today's Date: 06/13/2019 Time: 6060-0459 SLP Time Calculation (min) (ACUTE ONLY): 10 min  Assessment / Plan / Recommendation Clinical Impression  Pt was seen for dysphagia treatment and was cooperative throughout the session. She stated that she did not eat much of breakfast but denied any signs of aspiration since she has been avoiding use of straws. Pt tolerated thin liquids via cup without overt s/sx of aspiration and requested that additional trials be deferred since she was full. Pt stated that she typically demonstrates signs of aspiration with thin liquids and with solids, but inconsistently so. It is recommended that a modified barium swallow study be conducted to further assess swallow function. It is scheduled for 13:00 today and pt verbalized agreement with this plan.    HPI HPI: Shanea Karney is a 69 y.o. female with medical history significant of esophageal cancer on chemo and radiation, CAD, asthma, COPD, GERD, hypertension, hypothyroidism presenting to the ED with complaints of near syncope and vomiting.  Upon EMS arrival, patient was found to be in A. fib with RVR.  Pt has a PEG as a preventative measure, but she currently only uses it for one medication and she gets all other nutrition PO.  Chest CT on 06/12/19 reported: "Patchy opacity in the left lower greater than right lower lobe dependently. Differential considerations include atelectasis, pneumonia, aspiration or potentially post radiation change."       SLP Plan  New goals to be determined pending instrumental study       Recommendations  Diet recommendations: Dysphagia 2 (fine chop);Thin liquid Liquids provided via: Cup;No straw Medication Administration: Crushed with puree Compensations: Slow rate;Small sips/bites                Oral Care Recommendations: Oral care BID Follow up  Recommendations: Other (comment)(TBD) SLP Visit Diagnosis: Dysphagia, unspecified (R13.10) Plan: New goals to be determined pending instrumental study       Rishard Delange I. Hardin Negus, [place], [place] Office number [phone] Pager [place] 06/13/2019, 11:39 AM

## 2019-06-13 NOTE — Progress Notes (Signed)
Initial Nutrition Assessment  DOCUMENTATION CODES:   Not applicable  INTERVENTION:    Magic cup TID with meals, each supplement provides 290 kcal and 9 grams of protein  30 ml Prostat BID, each supplement provides 100 kcals and 15 grams protein.   MVI daily   NUTRITION DIAGNOSIS:   Increased nutrient needs related to cancer and cancer related treatments as evidenced by estimated needs.  GOAL:   Patient will meet greater than or equal to 90% of their needs  MONITOR:   Supplement acceptance, PO intake, Labs, Weight trends, I & O's  REASON FOR ASSESSMENT:   Consult Assessment of nutrition requirement/status  ASSESSMENT:   Patient with PMH significant for esophageal cancer undergoing chemotherapy/radation, CAD, COPD, GERD, HTN, and hypothyroidism. Presents this admission sepsis secondary to aspiration PNA.   Pt denies having loss in appetite PTA. States she typically consumed 2-3 meals daily. Has had swallowing issues over the few months. Had PEG placed as precaution but only uses it for medications. Interview cut short due to pt needing care. Will need to obtain more nutrition history if possible. Discussed supplement options. Pt does not like Ensure/Boost but willing to try Magic Cup. Plan to monitor intake closely and utilize PEG if needed.   Pt endorses a UBW of 175 lb and is unsure of recent wt loss. Records indicate pt weighed 186 lb on 1/18 and 183 lb this admission. Suspect fluid accumulation is masking acutal dry wt loss.   Medications: reviewed  Labs: reviewed   NUTRITION - FOCUSED PHYSICAL EXAM:    Most Recent Value  Orbital Region  No depletion  Upper Arm Region  Mild depletion  Thoracic and Lumbar Region  No depletion  Buccal Region  No depletion  Temple Region  Mild depletion  Clavicle Bone Region  Mild depletion  Clavicle and Acromion Bone Region  Mild depletion  Scapular Bone Region  No depletion  Dorsal Hand  Unable to assess  Patellar Region  Mild  depletion  Anterior Thigh Region  Mild depletion  Posterior Calf Region  Mild depletion  Edema (RD Assessment)  Mild  Hair  Reviewed  Eyes  Reviewed  Mouth  Reviewed  Skin  Reviewed  Nails  Reviewed     Diet Order:   Diet Order            DIET DYS 2 Room service appropriate? Yes with Assist; Fluid consistency: Thin  Diet effective now              EDUCATION NEEDS:   Education needs have been addressed  Skin:  Skin Assessment: Reviewed RN Assessment  Last BM:  6/4  Height:   Ht Readings from Last 1 Encounters:  06/12/19 5\' 2"  (1.575 m)    Weight:   Wt Readings from Last 1 Encounters:  06/12/19 83 kg    BMI:  Body mass index is 33.45 kg/m.  Estimated Nutritional Needs:   Kcal:  1900-2100 kcal  Protein:  95-115 grams  Fluid:  >/= 1.9 L/day   Mariana Single RD, LDN Clinical Nutrition Pager listed in [place]

## 2019-06-13 NOTE — Progress Notes (Signed)
Modified Barium Swallow Progress Note  Patient Details  Name: [name] MRN: [id number] Date of Birth: 11/06/1950  Today's Date: 06/13/2019  Modified Barium Swallow completed.  Full report located under Chart Review in the Imaging Section.  Brief recommendations include the following:  Clinical Impression  Pt was seen in radiology suite for modified barium swallow study. Trials of puree solids, mechanical soft (chopped) solids, a 42mm barium tablet, and thin liquids via cup and straw were administered. Pt requested that regular texture solids be deferred due to her edentulous status. Pt's oropharyngeal swallow mechanism was within functional limits. A single cough was demonstrated between swallows but no instances of penetration or aspiration were noted and pt denied sensation of aspiration. It is recommended that the current diet of dysphagia 2 solids with thin liquids be continued at this time. Pt reported reduced coughing with avoidance of straws and it is therefore recommended that this precaution be continued. Further skilled SLP services are not clinically indicated at this time.    Swallow Evaluation Recommendations       SLP Diet Recommendations: Dysphagia 2 (Fine chop) solids;Thin liquid   Liquid Administration via: Cup;No straw   Medication Administration: Whole meds with liquid   Supervision: Patient able to self feed   Compensations: Slow rate;Small sips/bites;Follow solids with liquid              Emelda Kohlbeck I. Hardin Negus, [place], [place] Office number [phone] Pager [place] 06/13/2019,1:26 PM

## 2019-06-13 NOTE — Plan of Care (Signed)
Continue to monitor

## 2019-06-14 LAB — CBC
HCT: 27.8 % — ABNORMAL LOW (ref 36.0–46.0)
Hemoglobin: 9.3 g/dL — ABNORMAL LOW (ref 12.0–15.0)
MCH: 30.1 pg (ref 26.0–34.0)
MCHC: 33.5 g/dL (ref 30.0–36.0)
MCV: 90 fL (ref 80.0–100.0)
Platelets: 222 10*3/uL (ref 150–400)
RBC: 3.09 MIL/uL — ABNORMAL LOW (ref 3.87–5.11)
RDW: 12.5 % (ref 11.5–15.5)
WBC: 2.9 10*3/uL — ABNORMAL LOW (ref 4.0–10.5)
nRBC: 0 % (ref 0.0–0.2)

## 2019-06-14 LAB — BASIC METABOLIC PANEL
Anion gap: 9 (ref 5–15)
BUN: 9 mg/dL (ref 8–23)
CO2: 23 mmol/L (ref 22–32)
Calcium: 8.1 mg/dL — ABNORMAL LOW (ref 8.9–10.3)
Chloride: 106 mmol/L (ref 98–111)
Creatinine, Ser: 0.89 mg/dL (ref 0.44–1.00)
GFR calc Af Amer: >60 mL/min (ref >60)
GFR calc non Af Amer: >60 mL/min (ref >60)
Glucose, Bld: 100 mg/dL — ABNORMAL HIGH (ref 70–99)
Potassium: 3.3 mmol/L — ABNORMAL LOW (ref 3.5–5.1)
Sodium: 138 mmol/L (ref 135–145)

## 2019-06-14 MED ORDER — AMOXICILLIN-POT CLAVULANATE 875-125 MG PO TABS: 1.0000 | ORAL_TABLET | Freq: Two times a day (BID) | ORAL | 0 refills | Status: AC

## 2019-06-14 MED ORDER — PRO-STAT SUGAR FREE PO LIQD: 30.0000 mL | Freq: Two times a day (BID) | ORAL | 0 refills | Status: AC

## 2019-06-14 MED ORDER — FREE WATER: 200.0000 mL | Freq: Three times a day (TID) | 0 refills | Status: AC

## 2019-06-14 MED ORDER — SUCRALFATE 1 GM/10ML PO SUSP: 1.0000 g | Freq: Three times a day (TID) | ORAL | 0 refills | Status: AC

## 2019-06-14 NOTE — Plan of Care (Signed)
Continue to monitor

## 2019-06-14 NOTE — Discharge Instructions (Signed)
Dysphagia Eating Plan, Minced and Moist Foods This eating plan is for people with moderate swallowing problems who are transitioning from pureed to solid foods. Moist and minced foods are soft and cut into very small chunks so that they can be swallowed safely. On this eating plan, you may be instructed to drink liquids that are thickened. Work with your health care provider and your diet and nutrition specialist (dietitian) to make sure that you are following the diet safely and getting all the nutrients you need. What are tips for following this plan? General guidelines for foods   You may eat foods that are soft and moist.  Always test food texture before taking a bite. Poke food with a fork or spoon to make sure it is tender.  Take small bites. Each bite should be smaller than your little finger nail (about 4 mm by 4 mm).  If you were on a pureed food eating plan, you may still eat any of the foods included in that diet.  Avoid foods that are dry, hard, sticky, chewy, coarse, or crunchy.  Avoid foods that separate into thin liquids and solids, such as cereal with milk or chunky soups.  Avoid liquids that have seeds or chunks.  If instructed by your health care provider, thicken liquids. Follow your health care provider's instructions about what products to use, how to do this, and to what thickness. ? You may use a commercial thickener, rice cereal, or potato flakes. ? Thickened liquids are usually a "pudding-like" consistency, or they may be as thick as honey or thick enough to eat with a spoon. Cooking  You may need to use a blender, whisk, or masher to soften some of your foods.  To moisten foods, you may add liquids while you are blending, mashing, or grinding your foods to the right consistency. These liquids include gravies, sauces, vegetable or fruit juice, milk, half and half, or water.  Reheat foods slowly to prevent a tough crust from forming. Meal planning  Eat a  variety of foods in order to get all the nutrients you need.  Follow your meal plan as told by your health care provider or dietitian. What foods are allowed? Grains Soaked soft breads without nuts or seeds. Pancakes, sweet rolls, pastries, and French toast that have been moistened with syrup or sauce. Well-cooked pasta, noodles, rice, and bread dressing in very small pieces and thick sauce. Soft dumplings or spaetzle in very small pieces and butter or gravy. Soft-cooked cereals. Vegetables Very soft, well-cooked vegetables in very small pieces. Soft-cooked, mashed potatoes. Thickened vegetable juice. Fruits Canned or cooked fruits that are soft or moist and do not have skin or seeds. Fresh, soft bananas. Thickened fruit juices. Meat and other protein foods Tender, moist, and finely minced or ground meats or poultry. Moist meatballs or meatloaf. Fish without bones. Scrambled, poached, or soft-cooked eggs. Tofu. Tempeh and meat alternatives in very small pieces. Well-cooked, moistened and mashed beans, baked beans, peas, and other legumes. Dairy Thickened milk. Cream cheese. Yogurt. Cottage cheese. Sour cream. Fats and oils Butter. Margarine. Cream for cereal, depending on liquid consistency allowed. Gravy. Cream sauces. Mayonnaise. Sweets and desserts Pudding. Custard. Ice cream and sherbet. Whipped toppings. Soft, moist cakes. Icing. Jelly. Jams and preserves without seeds. Seasoning and other foods Sauces and salsas that have soft chunks that are smaller than 4mm. Salad dressings. Casseroles with small pieces of tender meat. All seasonings and sweeteners. Beverages Anything prepared at the thickness recommended by your   dietitian. What foods are not allowed? Grains Breads that are hard or have nuts or seeds. Dry biscuits, pancakes, waffles, and bread dressing. Coarse cereals. Cereals that have nuts, seeds, dried fruits, or coconut. Sticky rice. Large pieces of pasta. Vegetables All raw  vegetables. Tough, fibrous, chewy, or stringy cooked vegetables, such as celery, peas, broccoli, cabbage, Brussels sprouts, and asparagus. Potato skins. Potato and other vegetable chips. Fried or French-fried potatoes. Cooked corn and peas. Fruits Hard, crunchy, stringy, high-pulp, and juicy raw fruits such as apples, pineapple, papaya, and watermelon. Fruits with skins and seeds, such as grapes. Dried fruit and fruit leather. Meats and other protein foods Large pieces of meat. Dry, tough meats, such as bacon, sausage, and hot dogs. Chicken, turkey, or fish with skin and bones. Crunchy peanut butter. Nuts. Seeds. Dairy Yogurt with nuts, seeds, or large chunks. Large chunks of cheese. Frozen desserts and milk consistency not allowed by your dietitian. Sweets and desserts Coarse, hard, chewy, or sticky desserts. Any dessert with nuts, seeds, coconut, pineapple, or dried fruit. Bread pudding. Seasoning and other foods Soups and casseroles with large chunks. Sandwiches. Pizza. Summary  Moist and minced foods can be helpful for people with moderate swallowing problems.  On the dysphagia eating plan, you may eat foods that are soft, moist, and cut into pieces smaller than 4mm by 4mm.  You may be instructed to thicken liquids. Follow your health care provider's instructions about how to do this and to what consistency. This information is not intended to replace advice given to you by your health care provider. Make sure you discuss any questions you have with your health care provider. Document Revised: 04/15/2018 Document Reviewed: 04/04/2016 [employer] Patient Education  2020 [employer]  

## 2019-06-14 NOTE — Progress Notes (Signed)
Discharge instructions reviewed with patient.  All questions answered.

## 2019-06-14 NOTE — Plan of Care (Signed)
Discharge to home °

## 2019-06-14 NOTE — Progress Notes (Signed)
IV and telemetry discontinued at this time. CCMD notified.  

## 2019-06-15 NOTE — Discharge Summary (Addendum)
[facility] Discharge Summary   Patient: [name] LFY:[id number]  PCP: Martinique, Sarah T, MD  Date of admission: 06/12/2019   Date of discharge: 06/14/2019      Discharge Diagnoses:   Principal Problem:   Severe sepsis [facility]) Active Problems:   Aspiration pneumonia ([place])   Atrial fibrillation with rapid ventricular response ([place])   AKI (acute kidney injury) ([place])   Nausea & vomiting   Admitted From: home Disposition:  Home   Recommendations for Outpatient Follow-up:  1. PCP: please follow up with PCP in 1 week 2. Follow up LABS/TEST:  none  Follow-up Information    Martinique, Sarah T, MD. Schedule an appointment as soon as possible for a visit in 1 week(s).   Specialty: Family Medicine Contact information: [address]  [place] [address] [phone]        Marice Potter, MD. Schedule an appointment as soon as possible for a visit in 1 week(s).   Specialty: Oncology Contact information: [address] [place] [address] [phone]          Diet recommendation: Cardiac diet dysphagia 2 thin liquids   Activity: The patient is advised to gradually reintroduce usual activities, as tolerated  Discharge Condition: stable  Code Status: Full code   History of present illness: As per the H and P dictated on admission, "[name] is a 69 y.o. female with medical history significant of esophageal cancer on chemo and radiation, CAD, asthma, COPD, GERD, hypertension, hypothyroidism presenting to the ED with complaints of near syncope and vomiting.  Upon EMS arrival, patient was found to be in A. fib with RVR and was given 20 mg of Cardizem.  She was also given aspirin 324 mg and Zofran.    Patient states she was not feeling well all day yesterday.  She did not eat anything.  Around 7 PM in the evening while sitting down she felt nauseous and then passed out.  When she woke up there was vomit all over her.  She then stayed at home and a  few hours later she again felt lightheaded and started vomiting.  Family called EMS to bring her to the hospital.  She felt her heart racing.  Reports having left lower chest/upper abdominal and epigastric pins-and-needles like sensation since she was diagnosed with cancer.    Reports subjective fevers and chills.  States she has vomited previously as well after receiving chemotherapy.  Denies diarrhea.  Denies history of A. fib but states for the past few months she has noticed that sometimes while laying down in bed she feels her heart racing.  States she has esophageal cancer for which she is currently receiving chemo and radiation at the cancer center in [place].  ED Course: Afebrile.  Initially in A. fib with RVR with rate up to 160s to 170s.  Hypotensive with systolic in the 53I.  Not hypoxic.  Labs showing no leukocytosis.  Lactic acid 2.5 >2.9.  Hemoglobin 11.9, was 14.0 in December 2020.  Platelet count normal.  Corrected sodium 131.  BUN 27, creatinine 1.4.  Creatinine was previously 1.0.  BNP normal.  TSH normal.  Free T4 slightly elevated at 1.59.  Initial high-sensitivity troponin 30, repeat 46.  BNP normal.  SARS-CoV-2 PCR test negative.  Blood culture x2 pending.  UA not suggestive of infection.  CT angiogram chest negative for PE.  Showing progression in the regular esophageal wall thickening extending from the mid esophagus to the gastroesophageal junction,  patient with known primary esophageal malignancy.  Patchy opacity in the left lower greater than right lower lobe dependently  Differential considerations include atelectasis, pneumonia, aspiration, or potentially postradiation change.   Blood pressure improved and converted back to sinus rhythm after 3 L fluid boluses.  Patient was given vancomycin and Zosyn."  Hospital Course:  Summary of her active problems in the hospital is as following. 1.  Hypovolemia. Sepsis-like picture on admission with aspiration pneumonia. Sepsis ruled  out Improved after fourth bolus. Continue antibiotics.  MRSA PCR negative PEG tube site appears okay. X-ray shows PEG tube in appropriate position. Speech therapy recommends dysphagia 2 thin liquid diet. Carafate while swallowing.  2 A. fib with RVR.  Currently normal sinus rhythm. Has not re-occurred.  CHA2DS2VASc 3,  recommend anticoagulation discussion with PCP Cardiology and oncology given her risk of bleeding is also high given her GI malignancy.  3. AKI. Hyponatremia Improving with IV fluids. Continue to monitor.  4.  Elevated troponins. Demand ischemia. Monitor.  No WMA.  5.  Esophageal malignancy as per chemo and radiation. Follows up with oncology in [place]. CT scan does show evidence of progression. Outpatient follow-up with oncology.  6.  Dietary consult for diet education.  Patient risk for dehydration Body mass index is 33.45 kg/m.  Nutrition Problem: Increased nutrient needs Etiology: cancer and cancer related treatments Nutrition Interventions: Interventions: Magic cup, Prostat  Patient was seen by physical therapy, who recommended no therapy needed on discharge,  On the day of the discharge the patient's vitals were stable, and no other acute medical condition were reported by patient. the patient was felt safe to be discharge at Home with no therapy needed on discharge.  Consultants: none Procedures: Echocardiogram   Discharge Exam: General: Appear in no distress, no Rash; Oral Mucosa Clear, moist. Cardiovascular: S1 and S2 Present, no Murmur, Respiratory: normal respiratory effort, Bilateral Air entry present and no Crackles, no wheezes Abdomen: Bowel Sound present, Soft and no tenderness, no hernia Extremities: no Pedal edema, no calf tenderness Neurology: alert and oriented to time, place, and person affect appropriate.  Filed Weights   06/12/19 0026 06/12/19 0541  Weight: 79.4 kg 83 kg   Vitals:   06/14/19 0822 06/14/19 1100  BP:  (!) 114/49 123/68  Pulse: 87   Resp: 16 19  Temp: 97.9 F (36.6 C) 98.4 F (36.9 C)  SpO2: 97% 97%    DISCHARGE MEDICATION: Allergies as of 06/14/2019      Reactions   Honey Bee Venom Protein [bee Venom] Anaphylaxis   Ether Nausea And Vomiting   Nickel Other (See Comments)   infection   Other Other (See Comments)   Pt reports that she cannot have staples placed due to infection.   Latex Rash      Medication List    STOP taking these medications   ibuprofen 200 MG tablet Commonly known as: ADVIL   lisinopril-hydrochlorothiazide 10-12.5 MG tablet Commonly known as: ZESTORETIC     TAKE these medications   amoxicillin-clavulanate 875-125 MG tablet Commonly known as: AUGMENTIN Take 1 tablet by mouth 2 (two) times daily for 2 days.   aspirin EC 81 MG tablet Take 1 tablet (81 mg total) by mouth daily.   Breo Ellipta 100-25 MCG/INH Aepb Generic drug: fluticasone furoate-vilanterol Inhale 1 puff into the lungs as directed.   EPINEPHrine 0.3 mg/0.3 mL Soaj injection Commonly known as: EPI-PEN Inject 0.3 mg into the muscle as needed for anaphylaxis.   feeding supplement (PRO-STAT SUGAR FREE  64) Liqd Place 30 mLs into feeding tube 2 (two) times daily.   free water Soln Place 200 mLs into feeding tube every 8 (eight) hours.   gabapentin 300 MG capsule Commonly known as: NEURONTIN Take 300 mg by mouth 3 (three) times daily.   isosorbide mononitrate 30 MG 24 hr tablet Commonly known as: IMDUR Take 1 tablet (30 mg total) by mouth daily. Please call office to schedule follow up visit.   levothyroxine 75 MCG tablet Commonly known as: SYNTHROID Take 75 mcg by mouth daily before breakfast.   mometasone 50 MCG/ACT nasal spray Commonly known as: NASONEX Place 2 sprays into the nose daily.   pantoprazole 40 MG tablet Commonly known as: PROTONIX Take 40 mg by mouth daily.   rifaximin 550 MG Tabs tablet Commonly known as: XIFAXAN Take 550 mg by mouth See admin  instructions. Take 1 tablet (550 mg) by mouth twice daily for 2 weeks, hold for 4 months then resume cycle   rosuvastatin 40 MG tablet Commonly known as: CRESTOR Take 1 tablet (40 mg total) by mouth daily. Please call office to schedule follow up visit.   sucralfate 1 GM/10ML suspension Commonly known as: CARAFATE Take 10 mLs (1 g total) by mouth 4 (four) times daily -  with meals and at bedtime.   vitamin C 100 MG tablet Take 100 mg by mouth daily.      Allergies  Allergen Reactions  . Honey Bee Venom Protein [Bee Venom] Anaphylaxis  . Ether Nausea And Vomiting  . Nickel Other (See Comments)    infection  . Other Other (See Comments)    Pt reports that she cannot have staples placed due to infection.  . Latex Rash   Discharge Instructions    DIET DYS 2   Complete by: As directed    Fluid consistency: Thin   Increase activity slowly   Complete by: As directed       The results of significant diagnostics from this hospitalization (including imaging, microbiology, ancillary and laboratory) are listed below for reference.    Significant Diagnostic Studies: CT Angio Chest PE W and/or Wo Contrast  Result Date: 06/12/2019 CLINICAL DATA:  Shortness of breath. History of esophageal cancer, active chemotherapy and radiation. EXAM: CT ANGIOGRAPHY CHEST WITH CONTRAST TECHNIQUE: Multidetector CT imaging of the chest was performed using the standard protocol during bolus administration of intravenous contrast. Multiplanar CT image reconstructions and MIPs were obtained to evaluate the vascular anatomy. CONTRAST:  63 mL OMNIPAQUE IOHEXOL 350 MG/ML SOLN COMPARISON:  Radiograph earlier today.  Chest CTA 04/03/2019 FINDINGS: Cardiovascular: There are no filling defects within the pulmonary arteries to suggest pulmonary embolus. Aortic atherosclerosis without dissection. No aortic aneurysm. Heart is normal in size. There are coronary artery calcifications. No pericardial effusion. Right chest  port in place. Mediastinum/Nodes: Significant increase in irregular esophageal wall thickening extending from the mid esophagus to the gastroesophageal junction from the level of the carina distally. 11 mm right hilar node, nonspecific. No enlarged mediastinal lymph nodes. No visualized thyroid nodule. Lungs/Pleura: Patchy opacity in the left lower greater than right lower lobe dependently. Mild central bronchial thickening. Mild underlying emphysema. There is a 3 mm small perifissural left upper lobe pulmonary nodule, series 7, image 21. No pleural fluid. Upper Abdomen: No acute findings. No free fluid. No adrenal nodule. Musculoskeletal: Degenerative change in the spine. There are no acute or suspicious osseous abnormalities. Review of the MIP images confirms the above findings. IMPRESSION: 1. No pulmonary embolus. 2. Progression  in irregular esophageal wall thickening extending from the mid esophagus to the gastroesophageal junction, patient with known primary esophageal malignancy. 3. Patchy opacity in the left lower greater than right lower lobe dependently. Differential considerations include atelectasis, pneumonia, aspiration or potentially post radiation change. 4. Prominent right hilar node measuring 11 mm, nonspecific. Tiny 3 mm left upper lobe pulmonary nodule, also seen on prior exams. Continued follow-up recommended. Aortic Atherosclerosis (ICD10-I70.0) and Emphysema (ICD10-J43.9). Electronically Signed   By: Keith Rake M.D.   On: 06/12/2019 02:04   DG ABDOMEN PEG TUBE LOCATION  Result Date: 06/12/2019 CLINICAL DATA:  Post G-tube placement EXAM: ABDOMEN - 1 VIEW COMPARISON:  Portable exam 1014 hours compared to 07/25/2013 FINDINGS: Small amount of contrast material has been injected via gastrostomy tube. Contrast opacifies the gastric lumen. Normal rugal fold pattern. No contrast extravasation. Nonobstructive bowel gas pattern. IMPRESSION: Gastrostomy tube is positioned within the stomach.  Electronically Signed   By: Lavonia Dana M.D.   On: 06/12/2019 12:16   DG CHEST PORT 1 VIEW  Result Date: 06/12/2019 CLINICAL DATA:  Near syncope, chest pain, history hypertension, COPD, irritable bowel syndrome EXAM: PORTABLE CHEST 1 VIEW COMPARISON:  Portable exam 1724 hours compared to 0032 hours FINDINGS: RIGHT subclavian Port-A-Cath with tip projecting over SVC. Normal heart size, mediastinal contours, and pulmonary vascularity. Atherosclerotic calcification aorta. Lungs clear. No acute infiltrate, pleural effusion or pneumothorax. Bones demineralized. IMPRESSION: No acute abnormalities. Aortic Atherosclerosis (ICD10-I70.0). Electronically Signed   By: Lavonia Dana M.D.   On: 06/12/2019 17:35   DG Chest Portable 1 View  Result Date: 06/12/2019 CLINICAL DATA:  Initial evaluation for acute shortness of breath. EXAM: PORTABLE CHEST 1 VIEW COMPARISON:  Prior radiograph from 05/27/2019. FINDINGS: Transverse heart size within normal limits. Mediastinal silhouette normal. Aortic atherosclerosis. Right-sided Port-A-Cath in place with tip overlying the mid SVC. Defibrillator pads overlie the left chest. Lungs mildly hypoinflated. Minimal streaky left basilar subsegmental atelectasis. No focal infiltrates. No edema or pleural effusion. No pneumothorax. Visualized osseous structures within normal limits. IMPRESSION: 1. Minimal streaky left basilar subsegmental atelectasis. 2. No other active cardiopulmonary disease. 3.  Aortic Atherosclerosis (ICD10-I70.0). Electronically Signed   By: Jeannine Boga M.D.   On: 06/12/2019 00:40   DG Swallowing Func-Speech Pathology  Result Date: 06/13/2019 Objective Swallowing Evaluation: Type of Study: Bedside Swallow Evaluation  Patient Details Name: [name] MRN: [id number] Date of Birth: June 10, 1950 Today's Date: 06/13/2019 Time: SLP Start Time (ACUTE ONLY): 1305 -SLP Stop Time (ACUTE ONLY): 1316 SLP Time Calculation (min) (ACUTE ONLY): 11 min Past Medical History:  Past Medical History: Diagnosis Date . Asthma  . COPD (chronic obstructive pulmonary disease) ([place])  . Diastolic dysfunction  . Diverticulosis  . Fatty liver  . GERD without esophagitis  . Hx of Clostridium difficile infection  . Hx of colonic polyps  . Hx of small bowel obstruction  . Hypertension, benign  . Hypothyroidism  . Irritable bowel syndrome  . Lumbar herniated disc  . Mixed hyperlipidemia  . Osteoarthritis  Past Surgical History: Past Surgical History: Procedure Laterality Date . ABDOMINAL HYSTERECTOMY  2002 . APPENDECTOMY  2005 . CARPAL TUNNEL RELEASE Bilateral  . LEFT HEART CATH AND CORONARY ANGIOGRAPHY N/A 12/28/2018  Procedure: LEFT HEART CATH AND CORONARY ANGIOGRAPHY;  Surgeon: Belva Crome, MD;  Location: [facility];  Service: Cardiovascular;  Laterality: N/A; . SMALL INTESTINE SURGERY  11/26/2012  Bowel obstruction, Dr. Pauletta Browns . TUBAL LIGATION  1980 HPI: [name] is a 69 y.o. female  with medical history significant of esophageal cancer on chemo and radiation, CAD, asthma, COPD, GERD, hypertension, hypothyroidism presenting to the ED with complaints of near syncope and vomiting.  Upon EMS arrival, patient was found to be in A. fib with RVR.  Pt has a PEG as a preventative measure, but she currently only uses it for one medication and she gets all other nutrition PO.  Chest CT on 06/12/19 reported: "Patchy opacity in the left lower greater than right lower lobe dependently. Differential considerations include atelectasis, pneumonia, aspiration or potentially post radiation change."  Subjective: Pt was alert Assessment / Plan / Recommendation CHL IP CLINICAL IMPRESSIONS 06/13/2019 Clinical Impression Pt was seen in radiology suite for modified barium swallow study. Trials of puree solids, mechanical soft (chopped) solids, a 8mm barium tablet, and thin liquids via cup and straw were administered. Pt requested that regular texture solids be deferred due to her edentulous status. Pt's  oropharyngeal swallow mechanism was within functional limits. A single cough was demonstrated between swallows but no instances of penetration or aspiration were noted and pt denied sensation of aspiration. It is recommended that the current diet of dysphagia 2 solids with thin liquids be continued at this time. Pt reported reduced coughing with avoidance of straws and it is therefore recommended that this precaution be continued. Further skilled SLP services are not clinically indicated at this time.  SLP Visit Diagnosis Dysphagia, unspecified (R13.10) Attention and concentration deficit following -- Frontal lobe and executive function deficit following -- Impact on safety and function Mild aspiration risk   CHL IP TREATMENT RECOMMENDATION 06/13/2019 Treatment Recommendations No treatment recommended at this time   Prognosis 06/13/2019 Prognosis for Safe Diet Advancement Good Barriers to Reach Goals -- Barriers/Prognosis Comment -- CHL IP DIET RECOMMENDATION 06/13/2019 SLP Diet Recommendations Dysphagia 2 (Fine chop) solids;Thin liquid Liquid Administration via Cup;No straw Medication Administration Whole meds with liquid Compensations Slow rate;Small sips/bites;Follow solids with liquid Postural Changes --   No flowsheet data found.  CHL IP FOLLOW UP RECOMMENDATIONS 06/13/2019 Follow up Recommendations None   CHL IP FREQUENCY AND DURATION 06/12/2019 Speech Therapy Frequency (ACUTE ONLY) min 2x/week Treatment Duration 2 weeks      CHL IP ORAL PHASE 06/13/2019 Oral Phase WFL Oral - Pudding Teaspoon -- Oral - Pudding Cup -- Oral - Honey Teaspoon -- Oral - Honey Cup -- Oral - Nectar Teaspoon -- Oral - Nectar Cup -- Oral - Nectar Straw -- Oral - Thin Teaspoon -- Oral - Thin Cup -- Oral - Thin Straw -- Oral - Puree -- Oral - Mech Soft -- Oral - Regular -- Oral - Multi-Consistency -- Oral - Pill -- Oral Phase - Comment --  CHL IP PHARYNGEAL PHASE 06/13/2019 Pharyngeal Phase WFL Pharyngeal- Pudding Teaspoon -- Pharyngeal -- Pharyngeal-  Pudding Cup -- Pharyngeal -- Pharyngeal- Honey Teaspoon -- Pharyngeal -- Pharyngeal- Honey Cup -- Pharyngeal -- Pharyngeal- Nectar Teaspoon -- Pharyngeal -- Pharyngeal- Nectar Cup -- Pharyngeal -- Pharyngeal- Nectar Straw -- Pharyngeal -- Pharyngeal- Thin Teaspoon -- Pharyngeal -- Pharyngeal- Thin Cup -- Pharyngeal -- Pharyngeal- Thin Straw -- Pharyngeal -- Pharyngeal- Puree -- Pharyngeal -- Pharyngeal- Mechanical Soft -- Pharyngeal -- Pharyngeal- Regular -- Pharyngeal -- Pharyngeal- Multi-consistency -- Pharyngeal -- Pharyngeal- Pill -- Pharyngeal -- Pharyngeal Comment --  CHL IP CERVICAL ESOPHAGEAL PHASE 06/13/2019 Cervical Esophageal Phase WFL Pudding Teaspoon -- Pudding Cup -- Honey Teaspoon -- Honey Cup -- Nectar Teaspoon -- Nectar Cup -- Nectar Straw -- Thin Teaspoon -- Thin Cup -- Thin Straw -- Puree --  Mechanical Soft -- Regular -- Multi-consistency -- Pill -- Cervical Esophageal Comment -- Tobie Poet I. Hardin Negus, [place], [place] Office number [phone] Pager [phone] Horton Marshall 06/13/2019, 4:03 PM              ECHOCARDIOGRAM COMPLETE  Result Date: 06/13/2019    ECHOCARDIOGRAM REPORT   Patient Name:   [name] Date of Exam: 06/12/2019 Medical Rec #:  [id number]         Height:       62.0 in Accession #:    [id number]        Weight:       182.9 lb Date of Birth:  1950-10-15         BSA:          1.840 m Patient Age:    85 years          BP:           108/58 mmHg Patient Gender: F                 HR:           97 bpm. Exam Location:  Inpatient Procedure: 2D Echo Indications:    atrial fibrillation 427.31  History:        Patient has no prior history of Echocardiogram examinations.                 CAD, COPD and sepsis; Risk Factors:Current Smoker, Diabetes,                 Dyslipidemia and Hypertension.  Sonographer:    Johny Chess Referring Phys: [id number] [place]  1. Left ventricular ejection fraction, by estimation, is 55 to 60%. The left  ventricle has normal function. The left ventricle has no regional wall motion abnormalities. Indeterminate diastolic filling due to E-A fusion.  2. Right ventricular systolic function is normal. The right ventricular size is normal. There is normal pulmonary artery systolic pressure. The estimated right ventricular systolic pressure is 57.3 mmHg.  3. The mitral valve is normal in structure. No evidence of mitral valve regurgitation.  4. The aortic valve is tricuspid. Aortic valve regurgitation is not visualized. Mild aortic valve sclerosis is present, with no evidence of aortic valve stenosis.  5. The inferior vena cava is normal in size with greater than 50% respiratory variability, suggesting right atrial pressure of 3 mmHg. Comparison(s): No prior Echocardiogram. FINDINGS  Left Ventricle: Left ventricular ejection fraction, by estimation, is 55 to 60%. The left ventricle has normal function. The left ventricle has no regional wall motion abnormalities. The left ventricular internal cavity size was normal in size. There is  no left ventricular hypertrophy. Abnormal (paradoxical) septal motion, consistent with left bundle branch block. Indeterminate diastolic filling due to E-A fusion. Right Ventricle: The right ventricular size is normal. No increase in right ventricular wall thickness. Right ventricular systolic function is normal. There is normal pulmonary artery systolic pressure. The tricuspid regurgitant velocity is 2.57 m/s, and  with an assumed right atrial pressure of 3 mmHg, the estimated right ventricular systolic pressure is 22.0 mmHg. Left Atrium: Left atrial size was normal in size. Right Atrium: Right atrial size was normal in size. Pericardium: There is no evidence of pericardial effusion. Mitral Valve: The mitral valve is normal in structure. Mild mitral annular calcification. No evidence of mitral valve regurgitation. Tricuspid Valve: The tricuspid valve is normal in structure. Tricuspid valve  regurgitation is trivial. Aortic Valve: The  aortic valve is tricuspid. Aortic valve regurgitation is not visualized. Mild aortic valve sclerosis is present, with no evidence of aortic valve stenosis. There is focal calcification of the left coronary cusp of the aortic valve. Pulmonic Valve: The pulmonic valve was grossly normal. Pulmonic valve regurgitation is not visualized. Aorta: The aortic root is normal in size and structure. Venous: The inferior vena cava is normal in size with greater than 50% respiratory variability, suggesting right atrial pressure of 3 mmHg. IAS/Shunts: No atrial level shunt detected by color flow Doppler.  LEFT VENTRICLE PLAX 2D LVIDd:         3.80 cm  Diastology LVIDs:         2.70 cm  LV e' lateral: 8.27 cm/s LV PW:         1.10 cm  LV e' medial:  9.03 cm/s LV IVS:        0.90 cm LVOT diam:     2.10 cm LV SV:         84 LV SV Index:   46 LVOT Area:     3.46 cm  RIGHT VENTRICLE             IVC RV S prime:     18.00 cm/s  IVC diam: 1.60 cm LEFT ATRIUM             Index       RIGHT ATRIUM           Index LA diam:        3.60 cm 1.96 cm/m  RA Area:     10.70 cm LA Vol (A2C):   45.6 ml 24.78 ml/m RA Volume:   22.30 ml  12.12 ml/m LA Vol (A4C):   28.3 ml 15.38 ml/m LA Biplane Vol: 35.9 ml 19.51 ml/m  AORTIC VALVE LVOT Vmax:   123.00 cm/s LVOT Vmean:  88.800 cm/s LVOT VTI:    0.243 m  AORTA Ao Root diam: 3.00 cm Ao Asc diam:  3.10 cm TRICUSPID VALVE TR Peak grad:   26.4 mmHg TR Vmax:        257.00 cm/s  SHUNTS Systemic VTI:  0.24 m Systemic Diam: 2.10 cm Dani Gobble Croitoru MD Electronically signed by Sanda Klein MD Signature Date/Time: 06/13/2019/6:34:30 PM    Final     Microbiology: Recent Results (from the past 240 hour(s))  SARS Coronavirus 2 by RT PCR (hospital order, performed in [place] hospital lab) Nasopharyngeal Nasopharyngeal Swab     Status: None   Collection Time: 06/12/19 12:34 AM   Specimen: Nasopharyngeal Swab  Result Value Ref Range Status   SARS Coronavirus 2  NEGATIVE NEGATIVE Final    Comment: (NOTE) SARS-CoV-2 target nucleic acids are NOT DETECTED. The SARS-CoV-2 RNA is generally detectable in upper and lower respiratory specimens during the acute phase of infection. The lowest concentration of SARS-CoV-2 viral copies this assay can detect is 250 copies / mL. A negative result does not preclude SARS-CoV-2 infection and should not be used as the sole basis for treatment or other patient management decisions.  A negative result may occur with improper specimen collection / handling, submission of specimen other than nasopharyngeal swab, presence of viral mutation(s) within the areas targeted by this assay, and inadequate number of viral copies (<250 copies / mL). A negative result must be combined with clinical observations, patient history, and epidemiological information. Fact Sheet for Patients:   StrictlyIdeas.no Fact Sheet for Healthcare Providers: BankingDealers.co.za This test is not yet approved or cleared  by the [place] and has been authorized for detection and/or diagnosis of SARS-CoV-2 by FDA under an Emergency Use Authorization (EUA).  This EUA will remain in effect (meaning this test can be used) for the duration of the COVID-19 declaration under Section 564(b)(1) of the Act, 21 U.S.C. section 360bbb-3(b)(1), unless the authorization is terminated or revoked sooner. Performed at [facility], [place] [address], [place], [place] [address]   Urine culture     Status: Abnormal   Collection Time: 06/12/19  2:11 AM   Specimen: In/Out Cath Urine  Result Value Ref Range Status   Specimen Description IN/OUT CATH URINE  Final   Special Requests   Final    NONE Performed at [facility], [place] [address] [place], [place] [address]    Culture (A)  Final    10,000 COLONIES/mL MULTIPLE SPECIES PRESENT, SUGGEST RECOLLECTION   Report Status 06/13/2019 FINAL  Final   Blood Culture (routine x 2)     Status: None (Preliminary result)   Collection Time: 06/12/19  2:45 AM   Specimen: BLOOD  Result Value Ref Range Status   Specimen Description BLOOD LEFT HAND  Final   Special Requests   Final    BOTTLES DRAWN AEROBIC ONLY Blood Culture results may not be optimal due to an inadequate volume of blood received in culture bottles   Culture   Final    NO GROWTH 2 DAYS Performed at [facility], [place] [address], [place], [place] [address]    Report Status PENDING  Incomplete  MRSA PCR Screening     Status: None   Collection Time: 06/12/19  7:43 AM   Specimen: Nasopharyngeal  Result Value Ref Range Status   MRSA by PCR NEGATIVE NEGATIVE Final    Comment:        The GeneXpert MRSA Assay (FDA approved for NASAL specimens only), is one component of a comprehensive MRSA colonization surveillance program. It is not intended to diagnose MRSA infection nor to guide or monitor treatment for MRSA infections. Performed at [facility], [place] [address] [place], [place] [address]      Labs: CBC: Recent Labs  Lab 06/12/19 0020 06/12/19 0034 06/13/19 0329 06/14/19 0401  WBC 9.2  --  3.6* 2.9*  NEUTROABS 8.3*  --   --   --   HGB 11.9* 11.9* 10.0* 9.3*  HCT 36.2 35.0* 30.6* 27.8*  MCV 91.2  --  90.3 90.0  PLT 316  --  227 628   Basic Metabolic Panel: Recent Labs  Lab 06/12/19 0020 06/12/19 0034 06/12/19 0610 06/13/19 0329 06/14/19 0401  NA 130* 131* 133* 139 138  K 3.8 3.4* 3.8 3.6 3.3*  CL 92* 92* 101 107 106  CO2 22  --  24 23 23   GLUCOSE 180* 176* 135* 107* 100*  BUN 27* 26* 18 9 9   CREATININE 1.46* 1.40* 1.19* 0.96 0.89  CALCIUM 9.1  --  8.0* 8.2* 8.1*   Liver Function Tests: Recent Labs  Lab 06/12/19 0020  AST 13*  ALT 11  ALKPHOS 41  BILITOT 0.6  PROT 6.7  ALBUMIN 3.3*   Recent Labs  Lab 06/12/19 0842  LIPASE 17   No results for input(s): AMMONIA in the last 168 hours. Cardiac Enzymes: No results for  input(s): CKTOTAL, CKMB, CKMBINDEX, TROPONINI in the last 168 hours. BNP (last 3 results) Recent Labs    06/12/19 0020  BNP 82.9   CBG: No results  for input(s): GLUCAP in the last 168 hours.  Time spent: 35 minutes  Signed:  Berle Mull  [facility] 06/14/2019 7:15 AM

## 2019-06-17 LAB — CULTURE, BLOOD (ROUTINE X 2): Culture: NO GROWTH

## 2019-06-23 DIAGNOSIS — C154 Malignant neoplasm of middle third of esophagus: Secondary | ICD-10-CM

## 2019-07-12 ENCOUNTER — Encounter: Payer: Self-pay | Admitting: Cardiology

## 2019-07-12 ENCOUNTER — Ambulatory Visit (INDEPENDENT_AMBULATORY_CARE_PROVIDER_SITE_OTHER): Payer: Medicare Other | Admitting: Cardiology

## 2019-07-12 ENCOUNTER — Other Ambulatory Visit: Payer: Self-pay

## 2019-07-12 VITALS — BP 122/64 | HR 90 | Ht 62.0 in | Wt 172.0 lb

## 2019-07-12 DIAGNOSIS — I2510 Atherosclerotic heart disease of native coronary artery without angina pectoris: Secondary | ICD-10-CM | POA: Diagnosis not present

## 2019-07-12 DIAGNOSIS — E782 Mixed hyperlipidemia: Secondary | ICD-10-CM

## 2019-07-12 DIAGNOSIS — I10 Essential (primary) hypertension: Secondary | ICD-10-CM | POA: Diagnosis not present

## 2019-07-12 DIAGNOSIS — I480 Paroxysmal atrial fibrillation: Secondary | ICD-10-CM

## 2019-07-12 DIAGNOSIS — C159 Malignant neoplasm of esophagus, unspecified: Secondary | ICD-10-CM

## 2019-07-12 DIAGNOSIS — E118 Type 2 diabetes mellitus with unspecified complications: Secondary | ICD-10-CM | POA: Diagnosis not present

## 2019-07-12 LAB — BASIC METABOLIC PANEL
BUN/Creatinine Ratio: 16 (ref 12–28)
BUN: 15 mg/dL (ref 8–27)
CO2: 25 mmol/L (ref 20–29)
Calcium: 9.6 mg/dL (ref 8.7–10.3)
Chloride: 97 mmol/L (ref 96–106)
Creatinine, Ser: 0.93 mg/dL (ref 0.57–1.00)
GFR calc Af Amer: 73 mL/min/{1.73_m2} (ref >59)
GFR calc non Af Amer: 63 mL/min/{1.73_m2} (ref >59)
Glucose: 137 mg/dL — ABNORMAL HIGH (ref 65–99)
Potassium: 4.8 mmol/L (ref 3.5–5.2)
Sodium: 137 mmol/L (ref 134–144)

## 2019-07-12 LAB — CBC
Hematocrit: 29.9 % — ABNORMAL LOW (ref 34.0–46.6)
Hemoglobin: 10 g/dL — ABNORMAL LOW (ref 11.1–15.9)
MCH: 29.3 pg (ref 26.6–33.0)
MCHC: 33.4 g/dL (ref 31.5–35.7)
MCV: 88 fL (ref 79–97)
Platelets: 201 10*3/uL (ref 150–450)
RBC: 3.41 x10E6/uL — ABNORMAL LOW (ref 3.77–5.28)
RDW: 15.5 % — ABNORMAL HIGH (ref 11.7–15.4)
WBC: 2.8 10*3/uL — ABNORMAL LOW (ref 3.4–10.8)

## 2019-07-12 LAB — MAGNESIUM: Magnesium: 1.7 mg/dL (ref 1.6–2.3)

## 2019-07-12 MED ORDER — NITROGLYCERIN 0.4 MG SL SUBL: 0.4000 mg | SUBLINGUAL_TABLET | SUBLINGUAL | 3 refills | Status: AC | PRN

## 2019-07-12 MED ORDER — METOPROLOL TARTRATE 25 MG PO TABS: 12.5000 mg | ORAL_TABLET | Freq: Two times a day (BID) | ORAL | 3 refills | Status: AC

## 2019-07-12 NOTE — Patient Instructions (Signed)
Medication Instructions:  Your physician has recommended you make the following change in your medication:  START: Nitroglycerin 0.4 mg take one tablet by mouth every 5 minutes as needed up to three times for chest pain.  *If you need a refill on your cardiac medications before your next appointment, please call your pharmacy*   Lab Work: Your physician recommends that you return for lab work in: [place], CBC If you have labs (blood work) drawn today and your tests are completely normal, you will receive your results only by: [name] MyChart Message (if you have MyChart) OR . A paper copy in the mail If you have any lab test that is abnormal or we need to change your treatment, we will call you to review the results.   Testing/Procedures: None   Follow-Up: At [facility], you and your health needs are our priority.  As part of our continuing mission to provide you with exceptional heart care, we have created designated Provider Care Teams.  These Care Teams include your primary Cardiologist (physician) and Advanced Practice Providers (APPs -  Physician Assistants and Nurse Practitioners) who all work together to provide you with the care you need, when you need it.  We recommend signing up for the patient portal called "MyChart".  Sign up information is provided on this After Visit Summary.  MyChart is used to connect with patients for Virtual Visits (Telemedicine).  Patients are able to view lab/test results, encounter notes, upcoming appointments, etc.  Non-urgent messages can be sent to your provider as well.   To learn more about what you can do with MyChart, go to [email or web address].    Your next appointment:   3 month(s)  The format for your next appointment:   In Person  Provider:   Berniece Salines, DO   Other Instructions

## 2019-07-12 NOTE — Progress Notes (Signed)
Cardiology Office Note:    Date:  07/12/2019   ID:  Brayton Layman, DOB 04-19-1950, MRN [id number]  PCP:  Martinique, Sarah T, MD  Cardiologist:  Berniece Salines, DO  Electrophysiologist:  None   Referring MD: [place]ique, Sarah T, MD     History of Present Illness:    [name] is a 69 y.o. 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   Patient presents for follow-up visit today. she has completed her chemo and is now pending 4 treatments of radiation.  Today she tells me that she has been experiencing intermittent.  On the left side of her breast.  She tells me that it is a streak around the breast that had been going on for few weeks now.  I time she tells me that it is bilateral breast area.  She is no experiencing this pain today.  She denies any shortness of breath and palpitations.  However tells me that she has been having significant difficulty swallowing solids and also sometimes liquids that she gets hydration through her port at her cancer treatments.  She also does have a feeding tube.  Past Medical History:  Diagnosis Date  . Asthma   . COPD (chronic obstructive pulmonary disease) ([place])   . Diastolic dysfunction   . Diverticulosis   . Fatty liver   . GERD without esophagitis   . Hx of Clostridium difficile infection   . Hx of colonic polyps   . Hx of small bowel obstruction   . Hypertension, benign   . Hypothyroidism   . Irritable bowel syndrome   . Lumbar herniated disc   . Mixed hyperlipidemia   . Osteoarthritis     Past Surgical History:  Procedure Laterality Date  . ABDOMINAL HYSTERECTOMY  2002  . APPENDECTOMY  2005  . CARPAL TUNNEL RELEASE Bilateral   . LEFT HEART CATH AND CORONARY ANGIOGRAPHY N/A 12/28/2018   Procedure: LEFT HEART CATH  AND CORONARY ANGIOGRAPHY;  Surgeon: Belva Crome, MD;  Location: [facility];  Service: Cardiovascular;  Laterality: N/A;  . SMALL INTESTINE SURGERY  11/26/2012   Bowel obstruction, Dr. Pauletta Browns  . TUBAL LIGATION  1980    Current Medications: Current Meds  Medication Sig  . Amino Acids-Protein Hydrolys (FEEDING SUPPLEMENT, PRO-STAT SUGAR FREE 64,) LIQD Place 30 mLs into feeding tube 2 (two) times daily.  . Ascorbic Acid (VITAMIN C) 100 MG tablet Take 100 mg by mouth daily.  [name] aspirin EC 81 MG tablet Take 1 tablet (81 mg total) by mouth daily.  [name] BREO ELLIPTA 100-25 MCG/INH AEPB Inhale 1 puff into the lungs as directed.   [name] EPINEPHrine 0.3 mg/0.3 mL IJ SOAJ injection Inject 0.3 mg into the muscle as needed for anaphylaxis.  [name] gabapentin (NEURONTIN) 300 MG capsule Take 300 mg by mouth 3 (three) times daily.   . isosorbide mononitrate (IMDUR) 30 MG 24 hr tablet Take 1 tablet (30 mg total) by mouth daily. Please call office to schedule follow up visit.  [name] levothyroxine (SYNTHROID) 75 MCG tablet Take 75 mcg by mouth daily before breakfast.   . mometasone (NASONEX) 50 MCG/ACT nasal spray Place 2 sprays into the nose daily.  . pantoprazole (PROTONIX) 40 MG tablet Take 40 mg by mouth daily.   . potassium chloride  20 MEQ/15ML (10%) SOLN Take 20 mEq by mouth daily.  . promethazine (PHENERGAN) 25 MG tablet Take 25 mg by mouth every 8 (eight) hours as needed.  . rifaximin (XIFAXAN) 550 MG TABS tablet Take 550 mg by mouth See admin instructions. Take 1 tablet (550 mg) by mouth twice daily for 2 weeks, hold for 4 months then resume cycle  . rosuvastatin (CRESTOR) 40 MG tablet Take 1 tablet (40 mg total) by mouth daily. Please call office to schedule follow up visit.  [name] sucralfate (CARAFATE) 1 GM/10ML suspension Take 10 mLs (1 g total) by mouth 4 (four) times daily -  with meals and at bedtime.  . Water For Irrigation, Sterile (FREE WATER) SOLN Place 200 mLs into feeding tube every 8 (eight) hours.       Allergies:   Honey bee venom protein [bee venom], Ether, Nickel, Other, and Latex   Social History   Socioeconomic History  . Marital status: Divorced    Spouse name: Not on file  . Number of children: 3  . Years of education: Not on file  . Highest education level: Not on file  Occupational History  . Not on file  Tobacco Use  . Smoking status: Former Smoker    Packs/day: 1.00    Quit date: 04/2019    Years since quitting: 0.2  . Smokeless tobacco: Never Used  Substance and Sexual Activity  . Alcohol use: Not Currently  . Drug use: Not Currently  . Sexual activity: Not on file  Other Topics Concern  . Not on file  Social History Narrative  . Not on file   Social Determinants of Health   Financial Resource Strain:   . Difficulty of Paying Living Expenses:   Food Insecurity:   . Worried About Charity fundraiser in the Last Year:   . Arboriculturist in the Last Year:   Transportation Needs:   . Film/video editor (Medical):   [name] Lack of Transportation (Non-Medical):   Physical Activity:   . Days of Exercise per Week:   . Minutes of Exercise per Session:   Stress:   . Feeling of Stress :   Social Connections:   . Frequency of Communication with Friends and Family:   . Frequency of Social Gatherings with Friends and Family:   . Attends Religious Services:   . Active Member of Clubs or Organizations:   . Attends Archivist Meetings:   [name] Marital Status:      Family History: The patient's family history includes Colon cancer in her daughter; Lung cancer in her brother; Ovarian cancer in her sister; Stomach cancer in her maternal grandmother.  ROS:   Review of Systems  Constitution: Negative for decreased appetite, fever and weight gain.  HENT: Negative for congestion, ear discharge, hoarse voice and sore throat.   Eyes: Negative for discharge, redness, vision loss in right eye and visual halos.  Cardiovascular: Negative for chest pain, dyspnea  on exertion, leg swelling, orthopnea and palpitations.  Respiratory: Negative for cough, hemoptysis, shortness of breath and snoring.   Endocrine: Negative for heat intolerance and polyphagia.  Hematologic/Lymphatic: Negative for bleeding problem. Does not bruise/bleed easily.  Skin: Negative for flushing, nail changes, rash and suspicious lesions.  Musculoskeletal: Negative for arthritis, joint pain, muscle cramps, myalgias, neck pain and stiffness.  Gastrointestinal: Negative for abdominal pain, bowel incontinence, diarrhea and excessive appetite.  Genitourinary: Negative for decreased libido, genital sores and incomplete emptying.  Neurological: Negative for  brief paralysis, focal weakness, headaches and loss of balance.  Psychiatric/Behavioral: Negative for altered mental status, depression and suicidal ideas.  Allergic/Immunologic: Negative for HIV exposure and persistent infections.    EKGs/Labs/Other Studies Reviewed:    The following studies were reviewed today:   EKG:  The ekg ordered today demonstrates   Left heart catheterization     Mid LM to Dist LM lesion is 30% stenosed.    Mild generalized diffuse disease in all 3 coronaries.  Right dominant coronary anatomy.  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  The first diagonal also contains 50% stenosis. Widely patent circumflex coronary artery. Widely patent right coronary artery. Normal left ventricular function.  LVEF 65%.  Normal LVEDP.  Echo IMPRESSIONS  1. Left ventricular ejection fraction, by estimation, is 55 to 60%. The  left ventricle has normal function. The left ventricle has no regional wall motion abnormalities. Indeterminate diastolic filling due to E-A fusion.  2. Right ventricular systolic function is normal. The right ventricular size is normal. There is normal pulmonary  artery systolic pressure. The estimated right ventricular systolic pressure is 09.3 mmHg.  3. The mitral valve is normal in structure. No evidence of mitral valve regurgitation.  4. The aortic valve is tricuspid. Aortic valve regurgitation is not visualized. Mild aortic valve sclerosis is present, with no evidence of aortic valve stenosis.  5. The inferior vena cava is normal in size with greater than 50%  respiratory variability, suggesting right atrial pressure of 3 mmHg.   Comparison(s): No prior Echocardiogram.  2 Recent Labs: 06/12/2019: ALT 11; B Natriuretic Peptide 82.9; TSH 2.153 06/14/2019: BUN 9; Creatinine, Ser 0.89; Hemoglobin 9.3; Platelets 222; Potassium 3.3; Sodium 138  Recent Lipid Panel    Component Value Date/Time   CHOL  01/20/2007 0505    96        ATP III CLASSIFICATION:  <200     mg/dL   Desirable  200-239  mg/dL   Borderline High  >=240    mg/dL   High          TRIG 159 (H) 01/20/2007 0505   HDL 28 (L) 01/20/2007 0505   CHOLHDL 3.4 01/20/2007 0505   VLDL 32 01/20/2007 0505   LDLCALC  01/20/2007 0505    36        Total Cholesterol/HDL:CHD Risk Coronary Heart Disease Risk Table                     Men   Women  1/2 Average Risk   3.4   3.3  Average Risk       5.0   4.4  2 X Average Risk   9.6   7.1  3 X Average Risk  23.4   11.0        Use the calculated Patient Ratio above and the CHD Risk Table to determine the patient's CHD Risk.        ATP III CLASSIFICATION (LDL):  <100     mg/dL   Optimal  100-129  mg/dL   Near or Above                    Optimal  130-159  mg/dL   Borderline  160-189  mg/dL   High  >190     mg/dL   Very High    Physical Exam:  VS:  BP 122/64   Pulse 90   Ht 5\' 2"  (1.575 m)   Wt 172 lb (78 kg)   SpO2 94%   BMI 31.46 kg/m     Wt Readings from Last 3 Encounters:  07/12/19 172 lb (78 kg)  06/12/19 182 lb 14.4 oz (83 kg)  01/24/19 186 lb (84.4 kg)     GEN: Well nourished, well developed in no acute distress HEENT:  Normal NECK: No JVD; No carotid bruits LYMPHATICS: No lymphadenopathy CARDIAC: S1S2 noted,RRR, no murmurs, rubs, gallops RESPIRATORY:  Clear to auscultation without rales, wheezing or rhonchi  ABDOMEN: Soft, non-tender, non-distended, +bowel sounds, no guarding. EXTREMITIES: No edema, No cyanosis, no clubbing MUSCULOSKELETAL:  No deformity  SKIN: Warm and dry NEUROLOGIC:  Alert and oriented x 3, non-focal PSYCHIATRIC:  Normal affect, good insight  ASSESSMENT:    1. Coronary artery disease involving native coronary artery of native heart without angina pectoris   2. Paroxysmal atrial fibrillation (HCC)   3. Hypertension, benign   4. Type 2 diabetes mellitus with complication, without long-term current use of insulin ([place])   5. Mixed hyperlipidemia   6. Malignant neoplasm of esophagus, unspecified location [facility])    PLAN:     Her chest pain does sound atypical for angina.  She is on Imdur 30 mg daily.  She tells me she has been taking this medication.  For now what I will do is start her on as needed nitroglycerin if she responds to this then I will go ahead and increase her Imdur to 60 mg daily.  I do suspect that this pain based on his characteristics is neuropathic pain.  But we will continue to closely monitor.    In addition she denies any palpitations.  She was recently diagnosed with atrial fibrillation and was started on Xarelto we will continue this medication for now.  He does not appear to be on a rate control agents.  Her heart rate is controlled today.  But I am to start her on low-dose beta blocker lopressor 12.5 mg twice daily.   Her blood pressure acceptable in the office today.  Hyperlipidemia continue patient on her current statin dose.  In terms of her esophageal cancer she has been treated with chemo with carboplatin/paclitaxel and radiation.  I reviewed her recent echocardiogram on June 12, 2019 at this time we will hold off on repeating any echocardiogram to reassess  LV function.  Per patient there are plans in place for surgery possibly in August.  Blood work will be done today to assess CBC, BMP.  The patient is in agreement with the above plan. The patient left the office in stable condition.  The patient will follow up in 3 months or sooner if needed.   Medication Adjustments/Labs and Tests Ordered: Current medicines are reviewed at length with the patient today.  Concerns regarding medicines are outlined above.  Orders Placed This Encounter  Procedures  . Basic Metabolic Panel (BMET)  . CBC  . Magnesium   Meds ordered this encounter  Medications  . nitroGLYCERIN (NITROSTAT) 0.4 MG SL tablet    Sig: Place 1 tablet (0.4 mg total) under the tongue every 5 (five) minutes as needed for chest pain.    Dispense:  90 tablet    Refill:  3    Patient Instructions  Medication Instructions:  Your physician has recommended you make the following change in your medication:  START: Nitroglycerin 0.4 mg take one tablet by mouth every 5  minutes as needed up to three times for chest pain.  *If you need a refill on your cardiac medications before your next appointment, please call your pharmacy*   Lab Work: Your physician recommends that you return for lab work in: [place], CBC If you have labs (blood work) drawn today and your tests are completely normal, you will receive your results only by: [name] MyChart Message (if you have MyChart) OR . A paper copy in the mail If you have any lab test that is abnormal or we need to change your treatment, we will call you to review the results.   Testing/Procedures: None   Follow-Up: At [facility], you and your health needs are our priority.  As part of our continuing mission to provide you with exceptional heart care, we have created designated Provider Care Teams.  These Care Teams include your primary Cardiologist (physician) and Advanced Practice Providers (APPs -  Physician Assistants and Nurse  Practitioners) who all work together to provide you with the care you need, when you need it.  We recommend signing up for the patient portal called "MyChart".  Sign up information is provided on this After Visit Summary.  MyChart is used to connect with patients for Virtual Visits (Telemedicine).  Patients are able to view lab/test results, encounter notes, upcoming appointments, etc.  Non-urgent messages can be sent to your provider as well.   To learn more about what you can do with MyChart, go to [email or web address].    Your next appointment:   3 month(s)  The format for your next appointment:   In Person  Provider:   Berniece Salines, DO   Other Instructions      Adopting a Healthy Lifestyle.  Know what a healthy weight is for you (roughly BMI <25) and aim to maintain this   Aim for 7+ servings of fruits and vegetables daily   65-80+ fluid ounces of water or unsweet tea for healthy kidneys   Limit to max 1 drink of alcohol per day; avoid smoking/tobacco   Limit animal fats in diet for cholesterol and heart health - choose grass fed whenever available   Avoid highly processed foods, and foods high in saturated/trans fats   Aim for low stress - take time to unwind and care for your mental health   Aim for 150 min of moderate intensity exercise weekly for heart health, and weights twice weekly for bone health   Aim for 7-9 hours of sleep daily   When it comes to diets, agreement about the perfect plan isnt easy to find, even among the experts. Experts at the [employer] developed an idea known as the Healthy Eating Plate. Just imagine a plate divided into logical, healthy portions.   The emphasis is on diet quality:   Load up on vegetables and fruits - one-half of your plate: Aim for color and variety, and remember that potatoes dont count.   Go for whole grains - one-quarter of your plate: Whole wheat, barley, wheat berries, quinoa, oats, brown  rice, and foods made with them. If you want pasta, go with whole wheat pasta.   Protein power - one-quarter of your plate: Fish, chicken, beans, and nuts are all healthy, versatile protein sources. Limit red meat.   The diet, however, does go beyond the plate, offering a few other suggestions.   Use healthy plant oils, such as olive, canola, soy, corn, sunflower and peanut. Check the labels, and avoid partially  hydrogenated oil, which have unhealthy trans fats.   If youre thirsty, drink water. Coffee and tea are good in moderation, but skip sugary drinks and limit milk and dairy products to one or two daily servings.   The type of carbohydrate in the diet is more important than the amount. Some sources of carbohydrates, such as vegetables, fruits, whole grains, and beans-are healthier than others.   Finally, stay active  Signed, Berniece Salines, DO  07/12/2019 8:55 AM    [place]

## 2019-07-13 ENCOUNTER — Telehealth: Payer: Self-pay

## 2019-07-13 NOTE — Telephone Encounter (Signed)
-----   Message from Berniece Salines, DO sent at 07/13/2019  8:26 AM EDT ----- Stable labs.

## 2019-07-13 NOTE — Telephone Encounter (Signed)
Spoke with patient regarding results and recommendation.  Patient verbalizes understanding and is agreeable to plan of care. Advised patient to call back with any issues or concerns.  

## 2019-07-28 DIAGNOSIS — C154 Malignant neoplasm of middle third of esophagus: Secondary | ICD-10-CM | POA: Diagnosis not present

## 2019-08-16 DIAGNOSIS — C154 Malignant neoplasm of middle third of esophagus: Secondary | ICD-10-CM

## 2019-08-25 ENCOUNTER — Other Ambulatory Visit: Payer: Self-pay | Admitting: Cardiology

## 2019-09-16 ENCOUNTER — Institutional Professional Consult (permissible substitution) (INDEPENDENT_AMBULATORY_CARE_PROVIDER_SITE_OTHER): Payer: Medicare Other | Admitting: Thoracic Surgery (Cardiothoracic Vascular Surgery)

## 2019-09-16 ENCOUNTER — Other Ambulatory Visit: Payer: Self-pay

## 2019-09-16 ENCOUNTER — Encounter: Payer: Self-pay | Admitting: Thoracic Surgery (Cardiothoracic Vascular Surgery)

## 2019-09-16 VITALS — BP 113/72 | HR 90 | Temp 97.7°F | Resp 20 | Ht 62.0 in | Wt 161.0 lb

## 2019-09-16 DIAGNOSIS — C154 Malignant neoplasm of middle third of esophagus: Secondary | ICD-10-CM

## 2019-09-16 NOTE — Progress Notes (Signed)
[name] [address]       [place] [address]             [phone]                    [name] [facility] Medical Record #[id number] Date of Birth: Nov 01, 1950  Referring: Rachel Potter, MD Primary Care: Martinique, Sarah T, MD Primary Cardiologist: Rachel Salines, DO  Chief Complaint:    Chief Complaint  Patient presents with  . Esophageal Cancer    Surgical consult, PET Scan 05/18/19, C/A/P CTA 07/25/19, upper GI abd BX 05/09/19    History of Present Illness:    [name] 69 y.o. 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  She subsequently underwent neoadjuvant chemoradiation which per her report was completed in July of this year.  Over the course of her neoadjuvant therapy she lost approximately 24 pounds.  She currently has a PEG tube in place and has needed to resume tube feeds due to odynophagia and dysphagia.  She describes a burning sensation with all meals.  She also has regurgitation of food as well.  She previously had a productive cough but this is since improved.     Smoking Hx: Quit cigarettes in May of this year   Zubrod Score: At the time of surgery this patient's most appropriate activity status/level should be described as: []     0    Normal activity, no symptoms [x]     1    Restricted in physical strenuous activity but ambulatory, able to do out light work []     2    Ambulatory and capable of self care, unable to do work activities, up and about               >50 % of waking hours                              []     3    Only limited self care, in bed greater than 50% of waking hours []     4    Completely disabled, no self care, confined to bed or chair []     5    Moribund   Past Medical History:  Diagnosis Date  . Asthma   . COPD (chronic obstructive pulmonary disease) ([facility])   . Diastolic dysfunction   .  Diverticulosis   . Fatty liver   . GERD without esophagitis   . Hx of Clostridium difficile infection   . Hx of colonic polyps   . Hx of small bowel obstruction   . Hypertension, benign   . Hypothyroidism   . Irritable bowel syndrome   . Lumbar herniated disc   . Mixed hyperlipidemia   . Osteoarthritis     Past Surgical History:  Procedure Laterality Date  . ABDOMINAL HYSTERECTOMY  2002  . APPENDECTOMY  2005  . CARPAL TUNNEL RELEASE Bilateral   . LEFT HEART CATH AND CORONARY ANGIOGRAPHY N/A 12/28/2018   Procedure: LEFT HEART CATH AND CORONARY ANGIOGRAPHY;  Surgeon: Belva Crome, MD;  Location: [facility];  Service: Cardiovascular;  Laterality: N/A;  . SMALL INTESTINE SURGERY  11/26/2012   Bowel obstruction, Dr. Pauletta Browns  . TUBAL LIGATION  1980    Family History  Problem Relation Age of Onset  . Ovarian cancer Sister   . Lung cancer Brother   . Stomach cancer Maternal Grandmother   . Colon cancer Daughter      Social History   Tobacco Use  Smoking Status Former Smoker  . Packs/day: 1.00  . Quit date: 04/2019  . Years since quitting: 0.4  Smokeless Tobacco Never Used    Social History   Substance and Sexual Activity  Alcohol Use Not Currently     Allergies  Allergen Reactions  . Honey Bee Venom Protein [Bee Venom] Anaphylaxis  . Ether Nausea And Vomiting  . Nickel Other (See Comments)    infection  . Other Other (See Comments)    Pt reports that she cannot have staples placed due to infection.  . Latex Rash    Current Outpatient Medications  Medication Sig Dispense Refill  . Amino Acids-Protein Hydrolys (FEEDING SUPPLEMENT, PRO-STAT SUGAR FREE 64,) LIQD Place 30 mLs into feeding tube 2 (two) times daily. 887 mL 0  . Ascorbic Acid (VITAMIN C) 100 MG tablet Take 100 mg by mouth daily.    [name] aspirin EC 81 MG tablet Take 1 tablet (81 mg total) by mouth daily. 90 tablet 3  . BREO ELLIPTA 100-25 MCG/INH AEPB Inhale 1 puff into the lungs as directed.      [name] EPINEPHrine 0.3 mg/0.3 mL IJ SOAJ injection Inject 0.3 mg into the muscle as needed for anaphylaxis.    [name] gabapentin (NEURONTIN) 300 MG capsule Take 300 mg by mouth 3 (three) times daily.     . isosorbide mononitrate (IMDUR) 30 MG 24 hr tablet TAKE 1 TABLET BY MOUTH  DAILY 90 tablet 3  . levothyroxine (SYNTHROID) 75 MCG tablet Take 75 mcg by mouth daily before breakfast.     . metoprolol tartrate (LOPRESSOR) 25 MG tablet Take 0.5 tablets (12.5 mg total) by mouth 2 (two) times daily. 180 tablet 3  . mometasone (NASONEX) 50 MCG/ACT nasal spray Place 2 sprays into the nose daily.    . nitroGLYCERIN (NITROSTAT) 0.4 MG SL tablet Place 1 tablet (0.4 mg total) under the tongue every 5 (five) minutes as needed for chest pain. 90 tablet 3  . oxyCODONE-acetaminophen (PERCOCET) 10-325 MG tablet Take 1 tablet by mouth every 4 (four) hours as needed for pain.    . pantoprazole (PROTONIX) 40 MG tablet Take 40 mg by mouth daily.     . potassium chloride 20 MEQ/15ML (10%) SOLN Take 20 mEq by mouth daily.    . promethazine (PHENERGAN) 25 MG tablet Take 25 mg by mouth every 8 (eight) hours as needed.    . rifaximin (XIFAXAN) 550 MG TABS tablet Take 550 mg by mouth See admin instructions. Take 1 tablet (550 mg) by mouth twice daily for 2 weeks, hold for 4 months then resume cycle    . rosuvastatin (CRESTOR) 40 MG tablet TAKE 1 TABLET BY MOUTH  DAILY 90 tablet 3  . sucralfate (CARAFATE) 1 GM/10ML suspension Take 10 mLs (1 g total) by mouth 4 (four) times daily -  with meals and at bedtime. 420 mL 0  . Water For Irrigation, Sterile (FREE WATER) SOLN Place 200 mLs into feeding tube every 8 (eight) hours. 1000 mL 0   No current facility-administered medications for this visit.    Review of Systems  Constitutional: Positive for malaise/fatigue and weight loss. Negative for chills and fever.  HENT: Positive for sore throat.   Respiratory: Positive for cough. Negative for shortness of breath.  Cardiovascular:  Negative for chest pain.  Gastrointestinal: Positive for abdominal pain, heartburn and vomiting.  Neurological: Negative.   Psychiatric/Behavioral: Positive for depression. The patient is nervous/anxious.      PHYSICAL EXAMINATION: BP 113/72   Pulse 90   Temp 97.7 F (36.5 C) (Skin)   Resp 20   Ht 5\' 2"  (1.575 m)   Wt 161 lb (73 kg)   SpO2 96% Comment: RA  BMI 29.45 kg/m  Physical Exam Constitutional:      General: She is not in acute distress.    Appearance: She is ill-appearing. She is not toxic-appearing.     Comments: Appears stated age  HENT:     Head: Normocephalic and atraumatic.  Eyes:     Extraocular Movements: Extraocular movements intact.     Conjunctiva/sclera: Conjunctivae normal.  Cardiovascular:     Rate and Rhythm: Normal rate.  Pulmonary:     Effort: Pulmonary effort is normal. No respiratory distress.  Abdominal:     General: Abdomen is flat.     Palpations: Abdomen is soft.       Comments: PEG tube in the left upper quadrant Lower midline scar is well-healed  Musculoskeletal:     Cervical back: Normal range of motion.  Neurological:     General: No focal deficit present.     Mental Status: She is alert and oriented to person, place, and time.     Diagnostic Studies & Laboratory data:    CT Scan: CT scan from July 2021.  There is marked thickening of the mid to distal esophagus involving 9 cm.  There is interval development of groundglass opacities in the right upper lobe and superior segments of the right lower lobe.  She has a stable 3 mm left upper lobe nodule PET/CT: Scan from May 18, 2019 was reviewed.  There is increased metabolic activity in the middle third of the esophagus with an SUV of 15.3.  The esophageal thickening begins at the level of the carina extends inferiorly over 5 cm.  Hypermetabolism terminates at the GE junction.  There is no hypermetabolic paraesophageal lymph nodes or mediastinal nodes.  There is a surgical anastomosis  and the left abdomen which likely corresponds with her previous bowel resection in 2014. EGD/EUS: Performed on 05/09/2019.  There is an elongated mid esophageal mass from 28 to 33 cm.  Path: Squamous cell cancer of the esophagus. Radiation Hx: July 9     I have independently reviewed the above radiology studies  and reviewed the findings with the patient.   Recent Lab Findings: Lab Results  Component Value Date   WBC 2.8 (L) 07/12/2019   HGB 10.0 (L) 07/12/2019   HCT 29.9 (L) 07/12/2019   PLT 201 07/12/2019   GLUCOSE 137 (H) 07/12/2019   CHOL  01/20/2007    96        ATP III CLASSIFICATION:  <200     mg/dL   Desirable  200-239  mg/dL   Borderline High  >=240    mg/dL   High          TRIG 159 (H) 01/20/2007   HDL 28 (L) 01/20/2007   LDLCALC  01/20/2007    36        Total Cholesterol/HDL:CHD Risk Coronary Heart Disease Risk Table                     Men   Women  1/2 Average Risk   3.4   3.3  Average Risk       5.0   4.4  2 X Average Risk   9.6   7.1  3 X Average Risk  23.4   11.0        Use the calculated Patient Ratio above and the CHD Risk Table to determine the patient's CHD Risk.        ATP III CLASSIFICATION (LDL):  <100     mg/dL   Optimal  100-129  mg/dL   Near or Above                    Optimal  130-159  mg/dL   Borderline  160-189  mg/dL   High  >190     mg/dL   Very High   ALT 11 06/12/2019   AST 13 (L) 06/12/2019   NA 137 07/12/2019   K 4.8 07/12/2019   CL 97 07/12/2019   CREATININE 0.93 07/12/2019   BUN 15 07/12/2019   CO2 25 07/12/2019   TSH 2.153 06/12/2019   INR 1.0 06/12/2019      Problem List: Mid esophageal squamous cell cancer Possible involvement of the carina given its location PEG tube placement History of small bowel resection exploratory laparotomy 25 pound weight loss Progressive dysphagia and odynophagia requiring reinitiation of tube feeds History of paroxysmal atrial fibrillation   Assessment / Plan:   69 year old  female with squamous cell carcinoma of the midesophagus.  Stage II T2 N0 M0 based off of imaging.  I do not see a formal EUS.  Her clinical picture is complicated by the fact that she has a PEG tube which can make gastric conduit creation difficult main concern for its proximity to the carina.  I will obtain a new PET/CT for further evaluation.  Her last dose of radiation was July 9 which puts her at 8 weeks.  Ideally I would like to operate on her within the next 2 to 3 weeks if her PET/CT does not show any progression of disease.  She would be at high risk of complications given her heart disease, as well as the presence of the PEG tube.  She would likely require a 3-hole esophagectomy, and given her history of exploratory laparotomy I am not sure if we can do the abdominal portion robotically.  We should be able to do the thoracic portion robotically however.  I  spent 55 minutes with  the patient face to face and greater then 50% of the time was spent in counseling and coordination of care.    Lajuana Matte 09/16/2019 12:58 PM

## 2019-09-20 ENCOUNTER — Telehealth (HOSPITAL_COMMUNITY): Payer: Self-pay | Admitting: *Deleted

## 2019-09-20 ENCOUNTER — Other Ambulatory Visit: Payer: Self-pay | Admitting: Thoracic Surgery (Cardiothoracic Vascular Surgery)

## 2019-09-20 DIAGNOSIS — C154 Malignant neoplasm of middle third of esophagus: Secondary | ICD-10-CM

## 2019-09-20 NOTE — Telephone Encounter (Signed)
Patient given detailed instructions per Myocardial Perfusion Study Information Sheet for the test on 09/21/19 at 10:45. Patient notified to arrive 15 minutes early and that it is imperative to arrive on time for appointment to keep from having the test rescheduled.  If you need to cancel or reschedule your appointment, please call the office within 24 hours of your appointment. . Patient verbalized understanding.[name]

## 2019-09-21 ENCOUNTER — Other Ambulatory Visit: Payer: Self-pay

## 2019-09-21 ENCOUNTER — Ambulatory Visit (HOSPITAL_COMMUNITY): Payer: Medicare Other | Attending: Internal Medicine

## 2019-09-21 DIAGNOSIS — E119 Type 2 diabetes mellitus without complications: Secondary | ICD-10-CM | POA: Diagnosis not present

## 2019-09-21 DIAGNOSIS — Z01810 Encounter for preprocedural cardiovascular examination: Secondary | ICD-10-CM | POA: Insufficient documentation

## 2019-09-21 DIAGNOSIS — C154 Malignant neoplasm of middle third of esophagus: Secondary | ICD-10-CM | POA: Insufficient documentation

## 2019-09-21 DIAGNOSIS — I447 Left bundle-branch block, unspecified: Secondary | ICD-10-CM | POA: Diagnosis not present

## 2019-09-21 DIAGNOSIS — I10 Essential (primary) hypertension: Secondary | ICD-10-CM | POA: Insufficient documentation

## 2019-09-21 LAB — MYOCARDIAL PERFUSION IMAGING
LV dias vol: 53 mL (ref 46–106)
LV sys vol: 21 mL
Peak HR: 99 {beats}/min
Rest HR: 68 {beats}/min
SDS: 3
SRS: 0
SSS: 3
TID: 0.87

## 2019-09-21 MED ORDER — REGADENOSON 0.4 MG/5ML IV SOLN
0.4000 mg | Freq: Once | INTRAVENOUS | Status: AC
  Administered 2019-09-21: 0.4 mg via INTRAVENOUS

## 2019-09-21 MED ORDER — TECHNETIUM TC 99M TETROFOSMIN IV KIT
31.7000 | PACK | Freq: Once | INTRAVENOUS | Status: AC | PRN
  Administered 2019-09-21: 31.7 via INTRAVENOUS
  Filled 2019-09-21: qty 32

## 2019-09-21 MED ORDER — TECHNETIUM TC 99M TETROFOSMIN IV KIT
9.9000 | PACK | Freq: Once | INTRAVENOUS | Status: AC | PRN
  Administered 2019-09-21: 9.9 via INTRAVENOUS
  Filled 2019-09-21: qty 10

## 2019-09-27 ENCOUNTER — Encounter (HOSPITAL_COMMUNITY)
Admission: RE | Admit: 2019-09-27 | Discharge: 2019-09-27 | Disposition: A | Discharge status: Home / Self Care | Payer: Medicare Other | Source: Ambulatory Visit | Attending: Thoracic Surgery (Cardiothoracic Vascular Surgery) | Admitting: Thoracic Surgery (Cardiothoracic Vascular Surgery)

## 2019-09-27 ENCOUNTER — Other Ambulatory Visit: Payer: Self-pay

## 2019-09-27 DIAGNOSIS — I700 Atherosclerosis of aorta: Secondary | ICD-10-CM | POA: Insufficient documentation

## 2019-09-27 DIAGNOSIS — C154 Malignant neoplasm of middle third of esophagus: Secondary | ICD-10-CM | POA: Insufficient documentation

## 2019-09-27 LAB — GLUCOSE, CAPILLARY: Glucose-Capillary: 87 mg/dL (ref 70–99)

## 2019-09-27 MED ORDER — FLUDEOXYGLUCOSE F - 18 (FDG) INJECTION
7.9400 | Freq: Once | INTRAVENOUS | Status: AC | PRN
  Administered 2019-09-27: 7.94 via INTRAVENOUS

## 2019-09-30 ENCOUNTER — Other Ambulatory Visit: Payer: Self-pay | Admitting: *Deleted

## 2019-09-30 ENCOUNTER — Ambulatory Visit (INDEPENDENT_AMBULATORY_CARE_PROVIDER_SITE_OTHER): Payer: Medicare Other | Admitting: Thoracic Surgery (Cardiothoracic Vascular Surgery)

## 2019-09-30 ENCOUNTER — Encounter: Payer: Self-pay | Admitting: *Deleted

## 2019-09-30 ENCOUNTER — Encounter: Payer: Self-pay | Admitting: Thoracic Surgery (Cardiothoracic Vascular Surgery)

## 2019-09-30 ENCOUNTER — Other Ambulatory Visit: Payer: Self-pay

## 2019-09-30 VITALS — BP 112/70 | HR 100 | Temp 97.9°F | Resp 20 | Ht 62.0 in | Wt 163.0 lb

## 2019-09-30 DIAGNOSIS — C154 Malignant neoplasm of middle third of esophagus: Secondary | ICD-10-CM | POA: Diagnosis not present

## 2019-09-30 DIAGNOSIS — C159 Malignant neoplasm of esophagus, unspecified: Secondary | ICD-10-CM

## 2019-09-30 NOTE — Progress Notes (Signed)
°   °  [address]       [place] [address]             [phone]       [name] comes in today to discuss results from her PET/CT and stress test.  The stress test was low risk.  PET/CT did not show any progression of her disease.  The mass appears smaller and there is also less uptake compared to her pretherapy PET/CT.  We had a long discussion about the risks, and benefits of proceeding with robotic assisted 3-hole esophagectomy.  I explained to her that we definitely would be able to do the thoracic portion robotically, but given her history of an appendectomy and a small bowel resection in the past I am unsure as to whether or not we can do her abdominal component robotically.  Her incision is lower midline in her epigastrium is clear except for the PEG tube thus we can potentially do the conduit formation robotically if there is no too many adhesions.  I explained to her again that she does have significant risk given the fact that she has a PEG tube in her gastric conduit, and on cross-sectional imaging there is not a defined line between her airway and the mass.  I will perform a bronchoscopy prior to any incisions and if I have any concerns for tumor invasion than the procedure will be aborted.  I again stressed the high risk nature of this procedure to her and her husband and she is willing to proceed.  Her main issue right now is that she is unable to swallow due to the mass that would like anything to improve her current state.  [name]

## 2019-10-10 NOTE — Pre-Procedure Instructions (Addendum)
[facility] [address] [place] [address] [place] [address] Phone: [phone] Fax: [phone]  [facility], [address], [address] [place], [place] [address] Phone: [phone] Fax: [phone]  [facility] [place], [place] [address] [place] [address] Phone: [phone] Fax: [phone]    Your procedure is scheduled on Fri., Oct. 8, 2021 from 7:30AM-3:50PM  Report to [facility] The Entrance "A" at 5:30AM  Call this number if you have problems the morning of surgery:  [phone]   Remember:  Do not eat or drink after midnight on Oct. 7th    Take these medicines the morning of surgery with A SIP OF WATER: Gabapentin (NEURONTIN)      Isosorbide mononitrate (IMDUR)  Levothyroxine (SYNTHROID) Metoprolol tartrate (LOPRESSOR) Pantoprazole (PROTONIX) Rosuvastatin (CRESTOR)  If Needed: EPINEPHrine  Mometasone (NASONEX)  NitroGLYCERIN (NITROSTAT) OxyCODONE (OXY IR/ROXICODONE) Promethazine (PHENERGAN)  Follow your surgeon's instructions on when to stop Aspirin.  If no instructions were given by your surgeon then you will need to call the office to get those instructions.     As of today, STOP taking all Aspirin (unless instructed by your doctor) and Other Aspirin containing products, Vitamins, Fish oils, and Herbal medications. Also stop all NSAIDS i.e. Advil, Ibuprofen, Motrin, Aleve, Anaprox, Naproxen, BC, Goody Powders, and all Supplements.   How to Manage Your Diabetes Before and After Surgery  Why is it important to control my blood sugar before and after surgery? . Improving blood sugar levels before and after surgery helps healing and can limit problems. . A way of improving blood sugar control is eating a healthy diet by: o  Eating less sugar and  carbohydrates o  Increasing activity/exercise o  Talking with your doctor about reaching your blood sugar goals . High blood sugars (greater than 180 mg/dL) can raise your risk of infections and slow your recovery, so you will need to focus on controlling your diabetes during the weeks before surgery. . Make sure that the doctor who takes care of your diabetes knows about your planned surgery including the date and location.  How do I manage my blood sugar before surgery? . Check your blood sugar at least 4 times a day, starting 2 days before surgery, to make sure that the level is not too high or low. o Check your blood sugar the morning of your surgery when you wake up and every 2 hours until you get to the Short Stay unit. . If your blood sugar is less than 70 mg/dL, you will need to treat for low blood sugar: o Do not take insulin. o Treat a low blood sugar (less than 70 mg/dL) with  cup of clear juice (cranberry or apple), 4 glucose tablets, OR glucose gel. Recheck blood sugar in 15 minutes after treatment (to make sure it is greater than 70 mg/dL). If your blood sugar is not greater than 70 mg/dL on recheck, call [phone] o  for further instructions.                                      . If your CBG is greater than 220 mg/dL, call the number above for further instructions.  . If you are admitted to  the hospital after surgery: o Your blood sugar will be checked by the staff and you will probably be given insulin after surgery (instead of oral diabetes medicines) to make sure you have good blood sugar levels. o The goal for blood sugar control after surgery is 80-180 mg/dL.  Reviewed and Endorsed by [facility] Patient Education Committee, August 2015   No Smoking of any kind, Tobacco/Vaping, or Alcohol products 24 hours prior to your procedure. If you use a Cpap at night, you may bring all equipment for your overnight stay.   Special instructions:   [facility]- Preparing For  Surgery  Before surgery, you can play an important role. Because skin is not sterile, your skin needs to be as free of germs as possible. You can reduce the number of germs on your skin by washing with CHG (chlorahexidine gluconate) Soap before surgery.  CHG is an antiseptic cleaner which kills germs and bonds with the skin to continue killing germs even after washing.    Please do not use if you have an allergy to CHG or antibacterial soaps. If your skin becomes reddened/irritated stop using the CHG.  Do not shave (including legs and underarms) for at least 48 hours prior to first CHG shower. It is OK to shave your face.  Please follow these instructions carefully.   1. Shower the NIGHT BEFORE SURGERY and the MORNING OF SURGERY with CHG.   2. If you chose to wash your hair, wash your hair first as usual with your normal shampoo.  3. After you shampoo, rinse your hair and body thoroughly to remove the shampoo.  4. Use CHG as you would any other liquid soap. You can apply CHG directly to the skin and wash gently with a scrungie or a clean washcloth.   5. Apply the CHG Soap to your body ONLY FROM THE NECK DOWN.  Do not use on open wounds or open sores. Avoid contact with your eyes, ears, mouth and genitals (private parts). Wash Face and genitals (private parts)  with your normal soap.  6. Wash thoroughly, paying special attention to the area where your surgery will be performed.  7. Thoroughly rinse your body with warm water from the neck down.  8. DO NOT shower/wash with your normal soap after using and rinsing off the CHG Soap.  9. Pat yourself dry with a CLEAN TOWEL.  10. Wear CLEAN PAJAMAS to bed the night before surgery, wear comfortable clothes the morning of surgery  11. Place CLEAN SHEETS on your bed the night of your first shower and DO NOT SLEEP WITH PETS.   Day of Surgery:             Remember to brush your teeth WITH YOUR REGULAR TOOTHPASTE.   Do not wear jewelry, make-up  or nail polish.  Do not wear lotions, powders, or perfumes, or deodorant.  Do not shave 48 hours prior to surgery.    Do not bring valuables to the hospital.  [facility] is not responsible for any belongings or valuables.  Contacts, dentures or bridgework may not be worn into surgery.  For patients admitted to the hospital, discharge time will be determined by your treatment team.  Patients discharged the day of surgery will not be allowed to drive home, and someone age 50 and over needs to stay with them for 24 hours.   Please wear clean clothes to the hospital/surgery center.    Please read over the following fact sheets that  you were given.

## 2019-10-11 ENCOUNTER — Ambulatory Visit (HOSPITAL_COMMUNITY)
Admission: RE | Admit: 2019-10-11 | Discharge: 2019-10-11 | Disposition: A | Discharge status: Home / Self Care | Payer: Medicare Other | Source: Ambulatory Visit | Attending: Thoracic Surgery (Cardiothoracic Vascular Surgery) | Admitting: Thoracic Surgery (Cardiothoracic Vascular Surgery)

## 2019-10-11 ENCOUNTER — Other Ambulatory Visit (HOSPITAL_COMMUNITY)
Admission: RE | Admit: 2019-10-11 | Discharge: 2019-10-11 | Disposition: A | Discharge status: Home / Self Care | Payer: Medicare Other | Source: Ambulatory Visit | Attending: Thoracic Surgery (Cardiothoracic Vascular Surgery) | Admitting: Thoracic Surgery (Cardiothoracic Vascular Surgery)

## 2019-10-11 ENCOUNTER — Other Ambulatory Visit: Payer: Self-pay

## 2019-10-11 ENCOUNTER — Encounter (HOSPITAL_COMMUNITY)
Admission: RE | Admit: 2019-10-11 | Discharge: 2019-10-11 | Disposition: A | Discharge status: Home / Self Care | Payer: Medicare Other | Source: Ambulatory Visit | Attending: Thoracic Surgery (Cardiothoracic Vascular Surgery) | Admitting: Thoracic Surgery (Cardiothoracic Vascular Surgery)

## 2019-10-11 ENCOUNTER — Encounter (HOSPITAL_COMMUNITY): Payer: Self-pay

## 2019-10-11 ENCOUNTER — Other Ambulatory Visit: Payer: Self-pay | Admitting: *Deleted

## 2019-10-11 DIAGNOSIS — Z79890 Hormone replacement therapy (postmenopausal): Secondary | ICD-10-CM | POA: Insufficient documentation

## 2019-10-11 DIAGNOSIS — K589 Irritable bowel syndrome without diarrhea: Secondary | ICD-10-CM | POA: Insufficient documentation

## 2019-10-11 DIAGNOSIS — Z79899 Other long term (current) drug therapy: Secondary | ICD-10-CM | POA: Insufficient documentation

## 2019-10-11 DIAGNOSIS — Z87891 Personal history of nicotine dependence: Secondary | ICD-10-CM | POA: Insufficient documentation

## 2019-10-11 DIAGNOSIS — Z7984 Long term (current) use of oral hypoglycemic drugs: Secondary | ICD-10-CM | POA: Insufficient documentation

## 2019-10-11 DIAGNOSIS — I2510 Atherosclerotic heart disease of native coronary artery without angina pectoris: Secondary | ICD-10-CM | POA: Insufficient documentation

## 2019-10-11 DIAGNOSIS — J449 Chronic obstructive pulmonary disease, unspecified: Secondary | ICD-10-CM | POA: Insufficient documentation

## 2019-10-11 DIAGNOSIS — E119 Type 2 diabetes mellitus without complications: Secondary | ICD-10-CM | POA: Insufficient documentation

## 2019-10-11 DIAGNOSIS — I110 Hypertensive heart disease with heart failure: Secondary | ICD-10-CM | POA: Insufficient documentation

## 2019-10-11 DIAGNOSIS — E785 Hyperlipidemia, unspecified: Secondary | ICD-10-CM | POA: Insufficient documentation

## 2019-10-11 DIAGNOSIS — K219 Gastro-esophageal reflux disease without esophagitis: Secondary | ICD-10-CM | POA: Insufficient documentation

## 2019-10-11 DIAGNOSIS — I480 Paroxysmal atrial fibrillation: Secondary | ICD-10-CM | POA: Insufficient documentation

## 2019-10-11 DIAGNOSIS — Z79891 Long term (current) use of opiate analgesic: Secondary | ICD-10-CM | POA: Insufficient documentation

## 2019-10-11 DIAGNOSIS — I5032 Chronic diastolic (congestive) heart failure: Secondary | ICD-10-CM | POA: Insufficient documentation

## 2019-10-11 DIAGNOSIS — K760 Fatty (change of) liver, not elsewhere classified: Secondary | ICD-10-CM | POA: Insufficient documentation

## 2019-10-11 DIAGNOSIS — F32A Depression, unspecified: Secondary | ICD-10-CM | POA: Insufficient documentation

## 2019-10-11 DIAGNOSIS — Z20822 Contact with and (suspected) exposure to covid-19: Secondary | ICD-10-CM | POA: Insufficient documentation

## 2019-10-11 DIAGNOSIS — G4733 Obstructive sleep apnea (adult) (pediatric): Secondary | ICD-10-CM | POA: Insufficient documentation

## 2019-10-11 DIAGNOSIS — C159 Malignant neoplasm of esophagus, unspecified: Secondary | ICD-10-CM | POA: Insufficient documentation

## 2019-10-11 DIAGNOSIS — Z01818 Encounter for other preprocedural examination: Secondary | ICD-10-CM | POA: Insufficient documentation

## 2019-10-11 DIAGNOSIS — E039 Hypothyroidism, unspecified: Secondary | ICD-10-CM | POA: Insufficient documentation

## 2019-10-11 HISTORY — DX: Type 2 diabetes mellitus without complications: E11.9

## 2019-10-11 HISTORY — DX: Malignant (primary) neoplasm, unspecified: C80.1

## 2019-10-11 HISTORY — DX: Paroxysmal atrial fibrillation: I48.0

## 2019-10-11 HISTORY — DX: Sleep apnea, unspecified: G47.30

## 2019-10-11 HISTORY — DX: Left bundle-branch block, unspecified: I44.7

## 2019-10-11 HISTORY — DX: Depression, unspecified: F32.A

## 2019-10-11 HISTORY — DX: Atherosclerotic heart disease of native coronary artery without angina pectoris: I25.10

## 2019-10-11 HISTORY — DX: Pneumonia, unspecified organism: J18.9

## 2019-10-11 LAB — BLOOD GAS, ARTERIAL
Acid-Base Excess: 1.3 mmol/L (ref 0.0–2.0)
Bicarbonate: 25 mmol/L (ref 20.0–28.0)
Drawn by: 58793
FIO2: 21
O2 Saturation: 96.6 %
Patient temperature: 37
pCO2 arterial: 37 mmHg (ref 32.0–48.0)
pH, Arterial: 7.444 (ref 7.350–7.450)
pO2, Arterial: 85.9 mmHg (ref 83.0–108.0)

## 2019-10-11 LAB — URINALYSIS, ROUTINE W REFLEX MICROSCOPIC
Bilirubin Urine: NEGATIVE
Glucose, UA: NEGATIVE mg/dL
Hgb urine dipstick: NEGATIVE
Ketones, ur: NEGATIVE mg/dL
Nitrite: NEGATIVE
Protein, ur: NEGATIVE mg/dL
Specific Gravity, Urine: 1.01 (ref 1.005–1.030)
pH: 8 (ref 5.0–8.0)

## 2019-10-11 LAB — HEMOGLOBIN A1C
Hgb A1c MFr Bld: 5.8 % — ABNORMAL HIGH (ref 4.8–5.6)
Mean Plasma Glucose: 119.76 mg/dL

## 2019-10-11 LAB — APTT: aPTT: >200 seconds (ref 24–36)

## 2019-10-11 LAB — CBC
HCT: 39.9 % (ref 36.0–46.0)
Hemoglobin: 12.9 g/dL (ref 12.0–15.0)
MCH: 28.6 pg (ref 26.0–34.0)
MCHC: 32.3 g/dL (ref 30.0–36.0)
MCV: 88.5 fL (ref 80.0–100.0)
Platelets: 183 10*3/uL (ref 150–400)
RBC: 4.51 MIL/uL (ref 3.87–5.11)
RDW: 13.8 % (ref 11.5–15.5)
WBC: 4.5 10*3/uL (ref 4.0–10.5)
nRBC: 0 % (ref 0.0–0.2)

## 2019-10-11 LAB — PROTIME-INR
INR: 1.3 — ABNORMAL HIGH (ref 0.8–1.2)
Prothrombin Time: 15.4 seconds — ABNORMAL HIGH (ref 11.4–15.2)

## 2019-10-11 LAB — COMPREHENSIVE METABOLIC PANEL
ALT: 15 U/L (ref 0–44)
AST: 18 U/L (ref 15–41)
Albumin: 3.9 g/dL (ref 3.5–5.0)
Alkaline Phosphatase: 45 U/L (ref 38–126)
Anion gap: 14 (ref 5–15)
BUN: 14 mg/dL (ref 8–23)
CO2: 21 mmol/L — ABNORMAL LOW (ref 22–32)
Calcium: 9.9 mg/dL (ref 8.9–10.3)
Chloride: 102 mmol/L (ref 98–111)
Creatinine, Ser: 0.85 mg/dL (ref 0.44–1.00)
GFR calc non Af Amer: >60 mL/min (ref >60)
Glucose, Bld: 105 mg/dL — ABNORMAL HIGH (ref 70–99)
Potassium: 3.9 mmol/L (ref 3.5–5.1)
Sodium: 137 mmol/L (ref 135–145)
Total Bilirubin: 0.8 mg/dL (ref 0.3–1.2)
Total Protein: 7.6 g/dL (ref 6.5–8.1)

## 2019-10-11 LAB — GLUCOSE, CAPILLARY: Glucose-Capillary: 107 mg/dL — ABNORMAL HIGH (ref 70–99)

## 2019-10-11 LAB — SURGICAL PCR SCREEN
MRSA, PCR: NEGATIVE
Staphylococcus aureus: NEGATIVE

## 2019-10-11 LAB — SARS CORONAVIRUS 2 (TAT 6-24 HRS): SARS Coronavirus 2: NEGATIVE

## 2019-10-11 NOTE — Progress Notes (Signed)
PCP - Dr. S. Martinique  Cardiologist - Dr. Raliegh Ip. Tobb  Pulm- Dr. Timoteo Gaul  Chest x-ray - 10/11/19  EKG - 10/11/19  Stress Test - 09/21/19 (E)  ECHO - 06/12/19 (E)  Cardiac Cath - 12/28/2018 (E)  AICD-na PM-na LOOP-na  Sleep Study - Yes- Positive CPAP - No, pt uses O2 prn  LABS- 10/11/19: ABG, CBC, CMP, PT, PTT, T/S, UA, PCR, COVID 10/26/2019: 2nd T/S   ASA-LD- 10/7  ERAS- No  HA1C-10/11/19 Fasting Blood Sugar - 98-120, today 107 Checks Blood Sugar __1___ time a week- Pt sts she was taken off of the medication after loosing weight.  Anesthesia- Yes- medical history  Pt denies having chest pain, sob, or fever at this time. All instructions explained to the pt, with a verbal understanding of the material. Pt agrees to go over the instructions while at home for a better understanding. Pt also instructed to self quarantine after being tested for COVID-19. The opportunity to ask questions was provided.   Coronavirus Screening  Have you experienced the following symptoms:  Cough yes/no: No Fever (>100.65F)  yes/no: No Runny nose yes/no: No Sore throat yes/no: No Difficulty breathing/shortness of breath  yes/no: No  Have you or a family member traveled in the last 14 days and where? yes/no: No   If the patient indicates "YES" to the above questions, their PAT will be rescheduled to limit the exposure to others and, the surgeon will be notified. THE PATIENT WILL NEED TO BE ASYMPTOMATIC FOR 14 DAYS.   If the patient is not experiencing any of these symptoms, the PAT nurse will instruct them to NOT bring anyone with them to their appointment since they may have these symptoms or traveled as well.   Please remind your patients and families that hospital visitation restrictions are in effect and the importance of the restrictions.

## 2019-10-11 NOTE — Progress Notes (Signed)
LVM for Meredith Leeds, RN for Dr. Kipp Brood to inform her of the abnormal APTT greater than 200.

## 2019-10-12 ENCOUNTER — Ambulatory Visit (HOSPITAL_COMMUNITY)
Admission: RE | Admit: 2019-10-12 | Discharge: 2019-10-12 | Disposition: A | Discharge status: Home / Self Care | Payer: Medicare Other | Source: Ambulatory Visit | Attending: Thoracic Surgery (Cardiothoracic Vascular Surgery) | Admitting: Thoracic Surgery (Cardiothoracic Vascular Surgery)

## 2019-10-12 ENCOUNTER — Encounter (HOSPITAL_COMMUNITY): Payer: Self-pay

## 2019-10-12 DIAGNOSIS — C159 Malignant neoplasm of esophagus, unspecified: Secondary | ICD-10-CM | POA: Insufficient documentation

## 2019-10-12 LAB — PULMONARY FUNCTION TEST
DL/VA % pred: 73 %
DL/VA: 3.09 ml/min/mmHg/L
DLCO cor % pred: 68 %
DLCO cor: 12.53 ml/min/mmHg
DLCO unc % pred: 67 %
DLCO unc: 12.34 ml/min/mmHg
FEF 25-75 Post: 2.18 L/sec
FEF 25-75 Pre: 1.48 L/sec
FEF2575-%Change-Post: 46 %
FEF2575-%Pred-Post: 122 %
FEF2575-%Pred-Pre: 83 %
FEV1-%Change-Post: 11 %
FEV1-%Pred-Post: 102 %
FEV1-%Pred-Pre: 91 %
FEV1-Post: 2.14 L
FEV1-Pre: 1.91 L
FEV1FVC-%Change-Post: 7 %
FEV1FVC-%Pred-Pre: 95 %
FEV6-%Change-Post: 4 %
FEV6-%Pred-Post: 103 %
FEV6-%Pred-Pre: 99 %
FEV6-Post: 2.73 L
FEV6-Pre: 2.61 L
FEV6FVC-%Change-Post: 0 %
FEV6FVC-%Pred-Post: 104 %
FEV6FVC-%Pred-Pre: 104 %
FVC-%Change-Post: 4 %
FVC-%Pred-Post: 99 %
FVC-%Pred-Pre: 95 %
FVC-Post: 2.74 L
FVC-Pre: 2.63 L
Post FEV1/FVC ratio: 78 %
Post FEV6/FVC ratio: 100 %
Pre FEV1/FVC ratio: 73 %
Pre FEV6/FVC Ratio: 99 %
RV % pred: 126 %
RV: 2.64 L
TLC % pred: 108 %
TLC: 5.14 L

## 2019-10-12 MED ORDER — ALBUTEROL SULFATE (2.5 MG/3ML) 0.083% IN NEBU
2.5000 mg | INHALATION_SOLUTION | Freq: Once | RESPIRATORY_TRACT | Status: AC
  Administered 2019-10-12: 2.5 mg via RESPIRATORY_TRACT

## 2019-10-12 NOTE — Anesthesia Preprocedure Evaluation (Addendum)
Anesthesia Evaluation  Patient identified by MRN, date of birth, ID band Patient awake    Reviewed: Allergy & Precautions, NPO status , Patient's Chart, lab work & pertinent test results  History of Anesthesia Complications Negative for: history of anesthetic complications  Airway Mallampati: I  TM Distance: >3 FB Neck ROM: Full    Dental no notable dental hx. (+) Dental Advisory Given, Edentulous Upper, Edentulous Lower   Pulmonary COPD, former smoker,    Pulmonary exam normal        Cardiovascular hypertension, Pt. on home beta blockers and Pt. on medications + CAD  Normal cardiovascular exam+ dysrhythmias Atrial Fibrillation   Nuclear stress test 09/21/19:  Nuclear stress EF: 60%.  There was no ST segment deviation noted during stress.  Defect 1: There is a small fixed perfusion defect of mild severity present in the mid anteroseptal and apical anterior location.  Findings consistent with prior myocardial infarction.  This is a low risk study.  No ischemia on perfusion images. Small, mild, fixed anteroapical and mid anteroseptal defect with mild hypokinesis in this region. This may represent a prior infarct.  Low risk study.    Echo 06/12/19: IMPRESSIONS   1. Left ventricular ejection fraction, by estimation, is 55 to 60%. The  left ventricle has normal function. The left ventricle has no regional  wall motion abnormalities. Indeterminate diastolic filling due to E-A  fusion.  2. Right ventricular systolic function is normal. The right ventricular  size is normal. There is normal pulmonary artery systolic pressure. The  estimated right ventricular systolic pressure is 01.0 mmHg.  3. The mitral valve is normal in structure. No evidence of mitral valve  regurgitation.  4. The aortic valve is tricuspid. Aortic valve regurgitation is not  visualized. Mild aortic valve sclerosis is present, with no evidence of   aortic valve stenosis.  5. The inferior vena cava is normal in size with greater than 50%  respiratory variability, suggesting right atrial pressure of 3 mmHg.    Cardiac cath 12/28/18:  Mid LM to Dist LM lesion is 30% stenosed.   Mild generalized diffuse disease in all 3 coronaries.  Right dominant coronary anatomy.  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 The first diagonal also contains 50% stenosis.  Widely patent circumflex coronary artery.  Widely patent right coronary artery.  Normal left ventricular function. LVEF 65%. Normal LVEDP.  RECOMMENDATIONS:   Aggressive risk factor modification  Consider using Plavix instead of 4 in combination with aspirin to minimize the risk of saccular aneurysm thrombus that could cause distal embolization.   Neuro/Psych PSYCHIATRIC DISORDERS Depression    GI/Hepatic Neg liver ROS, GERD  ,  Endo/Other  diabetes  Renal/GU      Musculoskeletal   Abdominal   Peds  Hematology   Anesthesia Other Findings   Reproductive/Obstetrics                           Anesthesia Physical Anesthesia Plan  ASA: III  Anesthesia Plan: General   Post-op Pain Management:    Induction: Intravenous  PONV Risk Score and Plan: 4 or greater and Ondansetron, Dexamethasone and Midazolam  Airway Management Planned: Double Lumen EBT  Additional Equipment: Arterial line and CVP  Intra-op Plan:   Post-operative Plan: Extubation in OR  Informed Consent: I have reviewed the patients History and  Physical, chart, labs and discussed the procedure including the risks, benefits and alternatives for the proposed anesthesia with the patient or authorized representative who has indicated his/her understanding and acceptance.     Dental advisory given  Plan Discussed with:  Anesthesiologist and CRNA  Anesthesia Plan Comments: (PAT note written 10/12/2019 by Myra Gianotti, PA-C. For PTT day of surgery.  )     Anesthesia Quick Evaluation

## 2019-10-12 NOTE — Progress Notes (Signed)
Anesthesia Chart Review:  Case: [id number] Date/Time: 10/28/2019 0715   Procedures:      XI ROBOTIC ASSISTED [place] ESOPHAGECTOMY (N/A Chest)     VIDEO BRONCHOSCOPY (N/A )     JEJUNOSTOMY (N/A )   Anesthesia type: General   Pre-op diagnosis: esophageal cancer   Location: [facility] OR ROOM 10 / [place] [facility]   Surgeons: Lajuana Matte, MD      DISCUSSION: Patient is a 69 year old female scheduled for the above procedure.  History includes former smoker (quit 04/07/19), COPD, esophageal cancer (diagnosed ~ 04/2019, s/p chemoradiation), CAD (mild diffuse CAD 12/2018), PAF (06/2019), DM2, OSA (uses nocturnal O2 as needed), diastolic dysfunction, HLD, HTN, hypothyroidism, IBS, GERD. S/p Port placement and G-tube 05/27/19.  Possible prior infarct but no ischemia on 09/21/2019 stress test.  Cardiologist is Dr. Harriet Masson with recent visit in July. She has a chronic LBBB and was in SR on PAT EKG.   PTT > 200, PT/INR 15.4/1.3. She is not on any anticoagulants, so PTT > 200 thought likely erroneous. Results called to TCTS, patient for repeat PTT on the day of surgery.   10/11/2019 presurgical COVID-19 test negative.  Anesthesia team to evaluate on the day of surgery.   VS: BP 118/67   Pulse 79   Temp 36.8 C (Oral)   Resp 19   Ht 5\' 2"  (1.575 m)   Wt 73.4 kg   SpO2 99%   BMI 29.59 kg/m    PROVIDERS: [place], Sarah T, MD is PCP  Berniece Salines, DO is cardiologist Gardiner Rhyme, MD is pulmonologist Lavera Guise, MD is HEM-[facility])   LABS: Labs reviewed: Repeat PTT. (all labs ordered are listed, but only abnormal results are displayed)  Labs Reviewed  APTT - Abnormal; Notable for the following components:      Result Value   aPTT >200 (*)    All other components within normal limits  BLOOD GAS, ARTERIAL - Abnormal; Notable for the following components:   Allens test (pass/fail) BRACHIAL ARTERY (*)    All other components within normal limits  COMPREHENSIVE METABOLIC PANEL - Abnormal; Notable for  the following components:   CO2 21 (*)    Glucose, Bld 105 (*)    All other components within normal limits  PROTIME-INR - Abnormal; Notable for the following components:   Prothrombin Time 15.4 (*)    INR 1.3 (*)    All other components within normal limits  URINALYSIS, ROUTINE W REFLEX MICROSCOPIC - Abnormal; Notable for the following components:   APPearance HAZY (*)    Leukocytes,Ua TRACE (*)    Bacteria, UA RARE (*)    All other components within normal limits  HEMOGLOBIN A1C - Abnormal; Notable for the following components:   Hgb A1c MFr Bld 5.8 (*)    All other components within normal limits  SURGICAL PCR SCREEN  CBC  TYPE AND SCREEN    PFTs 10/12/19: FVC 2.63 (95%), post 2.74 (99%). FEV1 1.91 (91%), post 2.14 (102%). DLCO unc 12.34 (67%), cor 12.53 (68%).   IMAGES: CXR 10/11/19: FINDINGS: The cardiomediastinal silhouette is unchanged in contour.RIGHT chest port with tip terminating over the SVC. Atherosclerotic calcifications of the aorta. No pleural effusion. No pneumothorax. LEFT basilar atelectasis versus scar. No new focal consolidation. Visualized abdomen is unremarkable. Multilevel degenerative changes of the thoracic spine. Wedging of a vertebral body at the thoracolumbar junction, mildly increased in comparison to prior CT. IMPRESSION: LEFT basilar atelectasis versus scar. No new focal consolidation.  PET Scan 09/27/19: IMPRESSION:  1. Diminished metabolic activity within the area of concern in the distal esophagus still with approximately 4 cm craniocaudal thickening in the middle third of the esophagus. 2. No signs of distant disease. 3. Area of increased metabolic activity within the crease along the lower margin of the pannus, this may represent inflammation though with a maximum SUV near that of the primary tumor would suggest direct clinical inspection to exclude focal inflammation or skin lesion. - Aortic Atherosclerosis (ICD10-I70.0).  CT neck  08/16/19 [facility], report in [facility]/PACS): IMPRESSION: 1. Negative for adenopathy in the neck. 2. Mass in the mid esophagus as noted on prior studies. Thickening of the upper esophagus may be due to radiation change. 3. Carotid artery disease bilaterally with estimated moderate stenosis on the left and mild stenosis on the right. 4. Multiple sub solid nodules throughout both lungs bilaterally. Mild progression. Differential diagnosis includes infection as well as metastatic disease. Short-term follow-up chest CT with contrast recommended in 4-6 weeks.   EKG: 10/11/19: Normal sinus rhythm Left bundle branch block Abnormal ECG No significant change since last tracing Confirmed by Mertie Moores ([id number]) on 10/11/2019 10:24:29 AM    CV: Nuclear stress test 09/21/19:  Nuclear stress EF: 60%.  There was no ST segment deviation noted during stress.  Defect 1: There is a small fixed perfusion defect of mild severity present in the mid anteroseptal and apical anterior location.  Findings consistent with prior myocardial infarction.  This is a low risk study.   No ischemia on perfusion images. Small, mild, fixed anteroapical and mid anteroseptal defect with mild hypokinesis in this region. This may represent a prior infarct.  Low risk study.    Echo 06/12/19: IMPRESSIONS   1. Left ventricular ejection fraction, by estimation, is 55 to 60%. The  left ventricle has normal function. The left ventricle has no regional  wall motion abnormalities. Indeterminate diastolic filling due to E-A  fusion.  2. Right ventricular systolic function is normal. The right ventricular  size is normal. There is normal pulmonary artery systolic pressure. The  estimated right ventricular systolic pressure is 36.6 mmHg.  3. The mitral valve is normal in structure. No evidence of mitral valve  regurgitation.  4. The aortic valve is tricuspid. Aortic valve regurgitation is not  visualized. Mild  aortic valve sclerosis is present, with no evidence of  aortic valve stenosis.  5. The inferior vena cava is normal in size with greater than 50%  respiratory variability, suggesting right atrial pressure of 3 mmHg.    Cardiac cath 12/28/18:  Mid LM to Dist LM lesion is 30% stenosed.    Mild generalized diffuse disease in all 3 coronaries.  Right dominant coronary anatomy.  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  The first diagonal also contains 50% stenosis.  Widely patent circumflex coronary artery.  Widely patent right coronary artery.  Normal left ventricular function.  LVEF 65%.  Normal LVEDP.  RECOMMENDATIONS:   Aggressive risk factor modification  Consider using Plavix instead of 4 in combination with aspirin to minimize the risk of saccular aneurysm thrombus that could cause distal embolization.   Past Medical History:  Diagnosis Date  . Asthma   . Cancer ([place])    esophageal cancer  . COPD (chronic obstructive pulmonary disease) ([place])   . Coronary artery disease   . Depression   . Diabetes mellitus without complication (  [place])    Type II  . Diastolic dysfunction   . Diverticulosis   . Fatty liver   . GERD without esophagitis   . Hx of Clostridium difficile infection   . Hx of colonic polyps   . Hx of small bowel obstruction   . Hypertension, benign   . Hypothyroidism   . Irritable bowel syndrome   . Left bundle branch block   . Lumbar herniated disc   . Mixed hyperlipidemia   . Osteoarthritis   . PAF (paroxysmal atrial fibrillation) ([place])    06/2019  . Pneumonia   . Sleep apnea    Uses O2 at bedtime on occasion    Past Surgical History:  Procedure Laterality Date  . ABDOMINAL HYSTERECTOMY  2002  . APPENDECTOMY  2005  . CARDIAC CATHETERIZATION    . CARPAL TUNNEL RELEASE Bilateral   . LEFT HEART CATH AND  CORONARY ANGIOGRAPHY N/A 12/28/2018   Procedure: LEFT HEART CATH AND CORONARY ANGIOGRAPHY;  Surgeon: Belva Crome, MD;  Location: [facility];  Service: Cardiovascular;  Laterality: N/A;  . SMALL INTESTINE SURGERY  11/26/2012   Bowel obstruction, Dr. Pauletta Browns  . TUBAL LIGATION  1980    MEDICATIONS: . FLUoxetine (PROZAC) 20 MG capsule  . Amino Acids-Protein Hydrolys (FEEDING SUPPLEMENT, PRO-STAT SUGAR FREE 64,) LIQD  . Ascorbic Acid (VITAMIN C) 100 MG tablet  . aspirin EC 81 MG tablet  . BREO ELLIPTA 100-25 MCG/INH AEPB  . Calcium Carbonate-Vitamin D (CALCIUM 600/VITAMIN D PO)  . EPINEPHrine 0.3 mg/0.3 mL IJ SOAJ injection  . gabapentin (NEURONTIN) 300 MG capsule  . isosorbide mononitrate (IMDUR) 30 MG 24 hr tablet  . levothyroxine (SYNTHROID) 75 MCG tablet  . metoprolol tartrate (LOPRESSOR) 25 MG tablet  . mometasone (NASONEX) 50 MCG/ACT nasal spray  . nitroGLYCERIN (NITROSTAT) 0.4 MG SL tablet  . oxyCODONE (OXY IR/ROXICODONE) 5 MG immediate release tablet  . oxyCODONE-acetaminophen (PERCOCET) 10-325 MG tablet  . pantoprazole (PROTONIX) 40 MG tablet  . potassium chloride 20 MEQ/15ML (10%) SOLN  . promethazine (PHENERGAN) 25 MG tablet  . rifaximin (XIFAXAN) 550 MG TABS tablet  . rosuvastatin (CRESTOR) 40 MG tablet  . sucralfate (CARAFATE) 1 GM/10ML suspension  . Water For Irrigation, Sterile (FREE WATER) SOLN   No current facility-administered medications for this encounter.    Myra Gianotti, PA-C Surgical Short Stay/Anesthesiology [facility] Phone [phone] [facility] Phone [phone] 10/12/2019 1:37 PM

## 2019-10-13 MED ORDER — SODIUM CHLORIDE 0.9 % IV SOLN
2.0000 g | INTRAVENOUS | Status: DC
  Filled 2019-10-13 (×3): qty 2

## 2019-10-14 ENCOUNTER — Other Ambulatory Visit: Payer: Self-pay

## 2019-10-14 ENCOUNTER — Inpatient Hospital Stay (HOSPITAL_COMMUNITY): Payer: Medicare Other | Admitting: Vascular Surgery

## 2019-10-14 ENCOUNTER — Inpatient Hospital Stay (HOSPITAL_COMMUNITY)
Admission: RE | Admit: 2019-10-14 | Discharge: 2019-12-07 | DRG: 003 | Disposition: E | Payer: Medicare Other | Attending: Thoracic Surgery (Cardiothoracic Vascular Surgery) | Admitting: Thoracic Surgery (Cardiothoracic Vascular Surgery)

## 2019-10-14 ENCOUNTER — Inpatient Hospital Stay (HOSPITAL_COMMUNITY): Payer: Medicare Other

## 2019-10-14 ENCOUNTER — Inpatient Hospital Stay (HOSPITAL_COMMUNITY): Payer: Medicare Other | Admitting: Certified Registered Nurse Anesthetist

## 2019-10-14 ENCOUNTER — Encounter (HOSPITAL_COMMUNITY)
Admission: RE | Disposition: E | Payer: Self-pay | Source: Home / Self Care | Attending: Thoracic Surgery (Cardiothoracic Vascular Surgery)

## 2019-10-14 DIAGNOSIS — J151 Pneumonia due to Pseudomonas: Secondary | ICD-10-CM | POA: Diagnosis not present

## 2019-10-14 DIAGNOSIS — E782 Mixed hyperlipidemia: Secondary | ICD-10-CM | POA: Diagnosis present

## 2019-10-14 DIAGNOSIS — J9620 Acute and chronic respiratory failure, unspecified whether with hypoxia or hypercapnia: Secondary | ICD-10-CM

## 2019-10-14 DIAGNOSIS — C154 Malignant neoplasm of middle third of esophagus: Principal | ICD-10-CM | POA: Diagnosis present

## 2019-10-14 DIAGNOSIS — B9689 Other specified bacterial agents as the cause of diseases classified elsewhere: Secondary | ICD-10-CM | POA: Diagnosis present

## 2019-10-14 DIAGNOSIS — Z7982 Long term (current) use of aspirin: Secondary | ICD-10-CM

## 2019-10-14 DIAGNOSIS — A419 Sepsis, unspecified organism: Secondary | ICD-10-CM | POA: Diagnosis not present

## 2019-10-14 DIAGNOSIS — Z79890 Hormone replacement therapy (postmenopausal): Secondary | ICD-10-CM

## 2019-10-14 DIAGNOSIS — I454 Nonspecific intraventricular block: Secondary | ICD-10-CM | POA: Diagnosis present

## 2019-10-14 DIAGNOSIS — Y838 Other surgical procedures as the cause of abnormal reaction of the patient, or of later complication, without mention of misadventure at the time of the procedure: Secondary | ICD-10-CM | POA: Diagnosis not present

## 2019-10-14 DIAGNOSIS — E876 Hypokalemia: Secondary | ICD-10-CM | POA: Diagnosis present

## 2019-10-14 DIAGNOSIS — K660 Peritoneal adhesions (postprocedural) (postinfection): Secondary | ICD-10-CM | POA: Diagnosis present

## 2019-10-14 DIAGNOSIS — Z515 Encounter for palliative care: Secondary | ICD-10-CM | POA: Diagnosis not present

## 2019-10-14 DIAGNOSIS — R000 Tachycardia, unspecified: Secondary | ICD-10-CM | POA: Diagnosis present

## 2019-10-14 DIAGNOSIS — Z20822 Contact with and (suspected) exposure to covid-19: Secondary | ICD-10-CM | POA: Diagnosis present

## 2019-10-14 DIAGNOSIS — R739 Hyperglycemia, unspecified: Secondary | ICD-10-CM | POA: Diagnosis not present

## 2019-10-14 DIAGNOSIS — Z9911 Dependence on respirator [ventilator] status: Secondary | ICD-10-CM | POA: Diagnosis not present

## 2019-10-14 DIAGNOSIS — Z91040 Latex allergy status: Secondary | ICD-10-CM

## 2019-10-14 DIAGNOSIS — J156 Pneumonia due to other aerobic Gram-negative bacteria: Secondary | ICD-10-CM | POA: Diagnosis not present

## 2019-10-14 DIAGNOSIS — J449 Chronic obstructive pulmonary disease, unspecified: Secondary | ICD-10-CM | POA: Diagnosis present

## 2019-10-14 DIAGNOSIS — K9189 Other postprocedural complications and disorders of digestive system: Secondary | ICD-10-CM | POA: Diagnosis not present

## 2019-10-14 DIAGNOSIS — Z7189 Other specified counseling: Secondary | ICD-10-CM | POA: Diagnosis not present

## 2019-10-14 DIAGNOSIS — R0602 Shortness of breath: Secondary | ICD-10-CM

## 2019-10-14 DIAGNOSIS — Z923 Personal history of irradiation: Secondary | ICD-10-CM | POA: Diagnosis not present

## 2019-10-14 DIAGNOSIS — Z9221 Personal history of antineoplastic chemotherapy: Secondary | ICD-10-CM

## 2019-10-14 DIAGNOSIS — J95821 Acute postprocedural respiratory failure: Secondary | ICD-10-CM | POA: Diagnosis not present

## 2019-10-14 DIAGNOSIS — J690 Pneumonitis due to inhalation of food and vomit: Secondary | ICD-10-CM | POA: Diagnosis not present

## 2019-10-14 DIAGNOSIS — Z9689 Presence of other specified functional implants: Secondary | ICD-10-CM

## 2019-10-14 DIAGNOSIS — K567 Ileus, unspecified: Secondary | ICD-10-CM

## 2019-10-14 DIAGNOSIS — K589 Irritable bowel syndrome without diarrhea: Secondary | ICD-10-CM | POA: Diagnosis present

## 2019-10-14 DIAGNOSIS — R64 Cachexia: Secondary | ICD-10-CM | POA: Diagnosis not present

## 2019-10-14 DIAGNOSIS — I2510 Atherosclerotic heart disease of native coronary artery without angina pectoris: Secondary | ICD-10-CM | POA: Diagnosis present

## 2019-10-14 DIAGNOSIS — Z9889 Other specified postprocedural states: Secondary | ICD-10-CM

## 2019-10-14 DIAGNOSIS — Z90710 Acquired absence of both cervix and uterus: Secondary | ICD-10-CM

## 2019-10-14 DIAGNOSIS — Z66 Do not resuscitate: Secondary | ICD-10-CM | POA: Diagnosis not present

## 2019-10-14 DIAGNOSIS — Z934 Other artificial openings of gastrointestinal tract status: Secondary | ICD-10-CM

## 2019-10-14 DIAGNOSIS — K760 Fatty (change of) liver, not elsewhere classified: Secondary | ICD-10-CM | POA: Diagnosis present

## 2019-10-14 DIAGNOSIS — J9601 Acute respiratory failure with hypoxia: Secondary | ICD-10-CM | POA: Diagnosis not present

## 2019-10-14 DIAGNOSIS — Z978 Presence of other specified devices: Secondary | ICD-10-CM

## 2019-10-14 DIAGNOSIS — G9340 Encephalopathy, unspecified: Secondary | ICD-10-CM

## 2019-10-14 DIAGNOSIS — R069 Unspecified abnormalities of breathing: Secondary | ICD-10-CM

## 2019-10-14 DIAGNOSIS — L8990 Pressure ulcer of unspecified site, unspecified stage: Secondary | ICD-10-CM | POA: Insufficient documentation

## 2019-10-14 DIAGNOSIS — G9341 Metabolic encephalopathy: Secondary | ICD-10-CM | POA: Diagnosis not present

## 2019-10-14 DIAGNOSIS — J440 Chronic obstructive pulmonary disease with acute lower respiratory infection: Secondary | ICD-10-CM | POA: Diagnosis not present

## 2019-10-14 DIAGNOSIS — I10 Essential (primary) hypertension: Secondary | ICD-10-CM | POA: Diagnosis present

## 2019-10-14 DIAGNOSIS — E870 Hyperosmolality and hypernatremia: Secondary | ICD-10-CM | POA: Diagnosis not present

## 2019-10-14 DIAGNOSIS — Y95 Nosocomial condition: Secondary | ICD-10-CM | POA: Diagnosis not present

## 2019-10-14 DIAGNOSIS — K9413 Enterostomy malfunction: Secondary | ICD-10-CM | POA: Diagnosis present

## 2019-10-14 DIAGNOSIS — R6521 Severe sepsis with septic shock: Secondary | ICD-10-CM | POA: Diagnosis not present

## 2019-10-14 DIAGNOSIS — Z23 Encounter for immunization: Secondary | ICD-10-CM

## 2019-10-14 DIAGNOSIS — Z6831 Body mass index (BMI) 31.0-31.9, adult: Secondary | ICD-10-CM

## 2019-10-14 DIAGNOSIS — Z8041 Family history of malignant neoplasm of ovary: Secondary | ICD-10-CM

## 2019-10-14 DIAGNOSIS — J95812 Postprocedural air leak: Secondary | ICD-10-CM | POA: Diagnosis not present

## 2019-10-14 DIAGNOSIS — E039 Hypothyroidism, unspecified: Secondary | ICD-10-CM | POA: Diagnosis present

## 2019-10-14 DIAGNOSIS — J90 Pleural effusion, not elsewhere classified: Secondary | ICD-10-CM

## 2019-10-14 DIAGNOSIS — Z9289 Personal history of other medical treatment: Secondary | ICD-10-CM

## 2019-10-14 DIAGNOSIS — R0902 Hypoxemia: Secondary | ICD-10-CM

## 2019-10-14 DIAGNOSIS — Z87891 Personal history of nicotine dependence: Secondary | ICD-10-CM

## 2019-10-14 DIAGNOSIS — K219 Gastro-esophageal reflux disease without esophagitis: Secondary | ICD-10-CM | POA: Diagnosis present

## 2019-10-14 DIAGNOSIS — E440 Moderate protein-calorie malnutrition: Secondary | ICD-10-CM | POA: Insufficient documentation

## 2019-10-14 DIAGNOSIS — Z09 Encounter for follow-up examination after completed treatment for conditions other than malignant neoplasm: Secondary | ICD-10-CM

## 2019-10-14 DIAGNOSIS — Z888 Allergy status to other drugs, medicaments and biological substances status: Secondary | ICD-10-CM

## 2019-10-14 DIAGNOSIS — R0603 Acute respiratory distress: Secondary | ICD-10-CM

## 2019-10-14 DIAGNOSIS — Z86010 Personal history of colonic polyps: Secondary | ICD-10-CM

## 2019-10-14 DIAGNOSIS — C159 Malignant neoplasm of esophagus, unspecified: Secondary | ICD-10-CM | POA: Diagnosis not present

## 2019-10-14 DIAGNOSIS — Z4659 Encounter for fitting and adjustment of other gastrointestinal appliance and device: Secondary | ICD-10-CM

## 2019-10-14 DIAGNOSIS — E43 Unspecified severe protein-calorie malnutrition: Secondary | ICD-10-CM | POA: Diagnosis not present

## 2019-10-14 DIAGNOSIS — J939 Pneumothorax, unspecified: Secondary | ICD-10-CM

## 2019-10-14 DIAGNOSIS — J189 Pneumonia, unspecified organism: Secondary | ICD-10-CM | POA: Diagnosis not present

## 2019-10-14 DIAGNOSIS — Z9049 Acquired absence of other specified parts of digestive tract: Secondary | ICD-10-CM | POA: Diagnosis not present

## 2019-10-14 DIAGNOSIS — J869 Pyothorax without fistula: Secondary | ICD-10-CM | POA: Diagnosis not present

## 2019-10-14 DIAGNOSIS — Z800 Family history of malignant neoplasm of digestive organs: Secondary | ICD-10-CM

## 2019-10-14 DIAGNOSIS — J398 Other specified diseases of upper respiratory tract: Secondary | ICD-10-CM | POA: Diagnosis not present

## 2019-10-14 DIAGNOSIS — R579 Shock, unspecified: Secondary | ICD-10-CM | POA: Diagnosis not present

## 2019-10-14 DIAGNOSIS — Z91030 Bee allergy status: Secondary | ICD-10-CM

## 2019-10-14 DIAGNOSIS — Z801 Family history of malignant neoplasm of trachea, bronchus and lung: Secondary | ICD-10-CM

## 2019-10-14 DIAGNOSIS — Z79899 Other long term (current) drug therapy: Secondary | ICD-10-CM

## 2019-10-14 HISTORY — PX: INTERCOSTAL NERVE BLOCK: SHX5021

## 2019-10-14 HISTORY — PX: JEJUNOSTOMY: SHX313

## 2019-10-14 HISTORY — PX: VIDEO BRONCHOSCOPY: SHX5072

## 2019-10-14 LAB — BLOOD GAS, ARTERIAL
Acid-base deficit: 7.8 mmol/L — ABNORMAL HIGH (ref 0.0–2.0)
Bicarbonate: 16.4 mmol/L — ABNORMAL LOW (ref 20.0–28.0)
Drawn by: 59101
FIO2: 21
O2 Saturation: 96.2 %
Patient temperature: 36.5
pCO2 arterial: 27.6 mmHg — ABNORMAL LOW (ref 32.0–48.0)
pH, Arterial: 7.387 (ref 7.350–7.450)
pO2, Arterial: 76.6 mmHg — ABNORMAL LOW (ref 83.0–108.0)

## 2019-10-14 LAB — POCT I-STAT 7, (LYTES, BLD GAS, ICA,H+H)
Acid-base deficit: 1 mmol/L (ref 0.0–2.0)
Acid-base deficit: 4 mmol/L — ABNORMAL HIGH (ref 0.0–2.0)
Acid-base deficit: 8 mmol/L — ABNORMAL HIGH (ref 0.0–2.0)
Bicarbonate: 27.1 mmol/L (ref 20.0–28.0)
Bicarbonate: 23.3 mmol/L (ref 20.0–28.0)
Bicarbonate: 19.1 mmol/L — ABNORMAL LOW (ref 20.0–28.0)
Calcium, Ion: 1.28 mmol/L (ref 1.15–1.40)
Calcium, Ion: 1.21 mmol/L (ref 1.15–1.40)
Calcium, Ion: 1.16 mmol/L (ref 1.15–1.40)
HCT: 32 % — ABNORMAL LOW (ref 36.0–46.0)
HCT: 30 % — ABNORMAL LOW (ref 36.0–46.0)
HCT: 28 % — ABNORMAL LOW (ref 36.0–46.0)
Hemoglobin: 10.9 g/dL — ABNORMAL LOW (ref 12.0–15.0)
Hemoglobin: 10.2 g/dL — ABNORMAL LOW (ref 12.0–15.0)
Hemoglobin: 9.5 g/dL — ABNORMAL LOW (ref 12.0–15.0)
O2 Saturation: 94 %
O2 Saturation: 97 %
O2 Saturation: 99 %
Patient temperature: 35.3
Patient temperature: 35.2
Patient temperature: 35.4
Potassium: 4.1 mmol/L (ref 3.5–5.1)
Potassium: 3.5 mmol/L (ref 3.5–5.1)
Potassium: 3.6 mmol/L (ref 3.5–5.1)
Sodium: 137 mmol/L (ref 135–145)
Sodium: 139 mmol/L (ref 135–145)
Sodium: 139 mmol/L (ref 135–145)
TCO2: 29 mmol/L (ref 22–32)
TCO2: 25 mmol/L (ref 22–32)
TCO2: 20 mmol/L — ABNORMAL LOW (ref 22–32)
pCO2 arterial: 59.6 mmHg — ABNORMAL HIGH (ref 32.0–48.0)
pCO2 arterial: 49.3 mmHg — ABNORMAL HIGH (ref 32.0–48.0)
pCO2 arterial: 40.7 mmHg (ref 32.0–48.0)
pH, Arterial: 7.256 — ABNORMAL LOW (ref 7.350–7.450)
pH, Arterial: 7.274 — ABNORMAL LOW (ref 7.350–7.450)
pH, Arterial: 7.27 — ABNORMAL LOW (ref 7.350–7.450)
pO2, Arterial: 79 mmHg — ABNORMAL LOW (ref 83.0–108.0)
pO2, Arterial: 94 mmHg (ref 83.0–108.0)
pO2, Arterial: 160 mmHg — ABNORMAL HIGH (ref 83.0–108.0)

## 2019-10-14 LAB — GLUCOSE, CAPILLARY
Glucose-Capillary: 100 mg/dL — ABNORMAL HIGH (ref 70–99)
Glucose-Capillary: 155 mg/dL — ABNORMAL HIGH (ref 70–99)
Glucose-Capillary: 162 mg/dL — ABNORMAL HIGH (ref 70–99)
Glucose-Capillary: 160 mg/dL — ABNORMAL HIGH (ref 70–99)

## 2019-10-14 LAB — BASIC METABOLIC PANEL
Anion gap: 14 (ref 5–15)
BUN: 14 mg/dL (ref 8–23)
CO2: 17 mmol/L — ABNORMAL LOW (ref 22–32)
Calcium: 8.5 mg/dL — ABNORMAL LOW (ref 8.9–10.3)
Chloride: 106 mmol/L (ref 98–111)
Creatinine, Ser: 1.32 mg/dL — ABNORMAL HIGH (ref 0.44–1.00)
GFR, Estimated: 41 mL/min — ABNORMAL LOW (ref >60)
Glucose, Bld: 176 mg/dL — ABNORMAL HIGH (ref 70–99)
Potassium: 3.8 mmol/L (ref 3.5–5.1)
Sodium: 137 mmol/L (ref 135–145)

## 2019-10-14 LAB — CBC
HCT: 31.9 % — ABNORMAL LOW (ref 36.0–46.0)
Hemoglobin: 10.3 g/dL — ABNORMAL LOW (ref 12.0–15.0)
MCH: 29.7 pg (ref 26.0–34.0)
MCHC: 32.3 g/dL (ref 30.0–36.0)
MCV: 91.9 fL (ref 80.0–100.0)
Platelets: 130 10*3/uL — ABNORMAL LOW (ref 150–400)
RBC: 3.47 MIL/uL — ABNORMAL LOW (ref 3.87–5.11)
RDW: 14 % (ref 11.5–15.5)
WBC: 7.5 10*3/uL (ref 4.0–10.5)
nRBC: 0 % (ref 0.0–0.2)

## 2019-10-14 LAB — POCT I-STAT, CHEM 8
BUN: 15 mg/dL (ref 8–23)
Calcium, Ion: 1.2 mmol/L (ref 1.15–1.40)
Chloride: 105 mmol/L (ref 98–111)
Creatinine, Ser: 0.8 mg/dL (ref 0.44–1.00)
Glucose, Bld: 208 mg/dL — ABNORMAL HIGH (ref 70–99)
HCT: 29 % — ABNORMAL LOW (ref 36.0–46.0)
Hemoglobin: 9.9 g/dL — ABNORMAL LOW (ref 12.0–15.0)
Potassium: 3.6 mmol/L (ref 3.5–5.1)
Sodium: 139 mmol/L (ref 135–145)
TCO2: 21 mmol/L — ABNORMAL LOW (ref 22–32)

## 2019-10-14 LAB — APTT: aPTT: 29 seconds (ref 24–36)

## 2019-10-14 LAB — PREPARE RBC (CROSSMATCH)

## 2019-10-14 LAB — ABO/RH: ABO/RH(D): A POS

## 2019-10-14 SURGERY — ESOPHAGECTOMY, ROBOT-ASSISTED
Anesthesia: General | Site: Chest | Laterality: Right

## 2019-10-14 MED ORDER — INDOCYANINE GREEN 25 MG IV SOLR
INTRAVENOUS | Status: DC | PRN
  Administered 2019-10-14 (×2): 7.5 mg via INTRAVENOUS

## 2019-10-14 MED ORDER — SODIUM CHLORIDE 0.9 % IV SOLN
2.0000 g | INTRAVENOUS | Status: AC
  Filled 2019-10-14 (×3): qty 2

## 2019-10-14 MED ORDER — DEXMEDETOMIDINE HCL IN NACL 400 MCG/100ML IV SOLN
0.0000 ug/kg/h | INTRAVENOUS | Status: DC
  Filled 2019-10-14: qty 100

## 2019-10-14 MED ORDER — KETOROLAC TROMETHAMINE 30 MG/ML IJ SOLN
INTRAMUSCULAR | Status: AC
  Filled 2019-10-14: qty 1

## 2019-10-14 MED ORDER — 0.9 % SODIUM CHLORIDE (POUR BTL) OPTIME
TOPICAL | Status: DC | PRN
  Administered 2019-10-14: 1000 mL
  Administered 2019-10-14: 2000 mL

## 2019-10-14 MED ORDER — LACTATED RINGERS IV SOLN: INTRAVENOUS | Status: DC | PRN

## 2019-10-14 MED ORDER — PROPOFOL 10 MG/ML IV BOLUS
INTRAVENOUS | Status: AC
  Filled 2019-10-14: qty 20

## 2019-10-14 MED ORDER — ONDANSETRON HCL 4 MG/2ML IJ SOLN
INTRAMUSCULAR | Status: DC | PRN
  Administered 2019-10-14 (×2): 4 mg via INTRAVENOUS

## 2019-10-14 MED ORDER — BUPIVACAINE HCL (PF) 0.5 % IJ SOLN
INTRAMUSCULAR | Status: AC
  Filled 2019-10-14: qty 30

## 2019-10-14 MED ORDER — FENTANYL CITRATE (PF) 250 MCG/5ML IJ SOLN
INTRAMUSCULAR | Status: AC
  Filled 2019-10-14: qty 5

## 2019-10-14 MED ORDER — PANTOPRAZOLE SODIUM 40 MG IV SOLR
40.0000 mg | Freq: Two times a day (BID) | INTRAVENOUS | Status: DC
  Administered 2019-10-14 – 2019-11-16 (×66): 40 mg via INTRAVENOUS
  Filled 2019-10-14 (×66): qty 40

## 2019-10-14 MED ORDER — ROCURONIUM BROMIDE 10 MG/ML (PF) SYRINGE
PREFILLED_SYRINGE | INTRAVENOUS | Status: AC
  Filled 2019-10-14: qty 10

## 2019-10-14 MED ORDER — ONDANSETRON HCL 4 MG/2ML IJ SOLN
INTRAMUSCULAR | Status: AC
  Filled 2019-10-14: qty 2

## 2019-10-14 MED ORDER — LIDOCAINE 2% (20 MG/ML) 5 ML SYRINGE
INTRAMUSCULAR | Status: AC
  Filled 2019-10-14: qty 5

## 2019-10-14 MED ORDER — PROPOFOL 10 MG/ML IV BOLUS
INTRAVENOUS | Status: DC | PRN
  Administered 2019-10-14: 40 mg via INTRAVENOUS
  Administered 2019-10-14: 160 mg via INTRAVENOUS

## 2019-10-14 MED ORDER — FENTANYL CITRATE (PF) 100 MCG/2ML IJ SOLN
INTRAMUSCULAR | Status: DC | PRN
Start: 2019-10-14 — End: 2019-10-14
  Administered 2019-10-14: 50 ug via INTRAVENOUS
  Administered 2019-10-14 (×2): 100 ug via INTRAVENOUS
  Administered 2019-10-14 (×3): 50 ug via INTRAVENOUS

## 2019-10-14 MED ORDER — ALBUTEROL SULFATE HFA 108 (90 BASE) MCG/ACT IN AERS
INHALATION_SPRAY | RESPIRATORY_TRACT | Status: DC | PRN
  Administered 2019-10-14 (×2): 2 via RESPIRATORY_TRACT

## 2019-10-14 MED ORDER — CELECOXIB 200 MG PO CAPS
200.0000 mg | ORAL_CAPSULE | Freq: Once | ORAL | Status: AC
  Administered 2019-10-14: 200 mg via ORAL
  Filled 2019-10-14: qty 1

## 2019-10-14 MED ORDER — SODIUM CHLORIDE 0.9 % IV SOLN
2.0000 g | Freq: Four times a day (QID) | INTRAVENOUS | Status: AC
  Administered 2019-10-14 – 2019-10-15 (×3): 2 g via INTRAVENOUS
  Filled 2019-10-14 (×3): qty 2

## 2019-10-14 MED ORDER — ACETAMINOPHEN 10 MG/ML IV SOLN
1000.0000 mg | Freq: Four times a day (QID) | INTRAVENOUS | Status: AC
  Administered 2019-10-14 – 2019-10-15 (×4): 1000 mg via INTRAVENOUS
  Filled 2019-10-14 (×4): qty 100

## 2019-10-14 MED ORDER — FENTANYL CITRATE (PF) 100 MCG/2ML IJ SOLN
INTRAMUSCULAR | Status: AC
Start: 2019-10-14 — End: 2019-10-14
  Administered 2019-10-14: 25 ug via INTRAVENOUS
  Filled 2019-10-14: qty 2

## 2019-10-14 MED ORDER — ROCURONIUM BROMIDE 10 MG/ML (PF) SYRINGE
PREFILLED_SYRINGE | INTRAVENOUS | Status: DC | PRN
  Administered 2019-10-14: 50 mg via INTRAVENOUS
  Administered 2019-10-14: 25 mg via INTRAVENOUS
  Administered 2019-10-14: 50 mg via INTRAVENOUS
  Administered 2019-10-14: 80 mg via INTRAVENOUS
  Administered 2019-10-14: 10 mg via INTRAVENOUS

## 2019-10-14 MED ORDER — SODIUM CHLORIDE 0.9 % IV SOLN
INTRAVENOUS | Status: AC
  Filled 2019-10-14: qty 1

## 2019-10-14 MED ORDER — ORAL CARE MOUTH RINSE: 15.0000 mL | Freq: Once | OROMUCOSAL | Status: AC

## 2019-10-14 MED ORDER — ROCURONIUM BROMIDE 10 MG/ML (PF) SYRINGE
PREFILLED_SYRINGE | INTRAVENOUS | Status: AC
  Filled 2019-10-14: qty 20

## 2019-10-14 MED ORDER — BUPIVACAINE LIPOSOME 1.3 % IJ SUSP
20.0000 mL | Freq: Once | INTRAMUSCULAR | Status: DC
  Filled 2019-10-14: qty 20

## 2019-10-14 MED ORDER — DEXAMETHASONE SODIUM PHOSPHATE 10 MG/ML IJ SOLN
INTRAMUSCULAR | Status: DC | PRN
  Administered 2019-10-14: 10 mg via INTRAVENOUS

## 2019-10-14 MED ORDER — SODIUM CHLORIDE 0.9 % IR SOLN
Status: DC | PRN
  Administered 2019-10-14: 1000 mL

## 2019-10-14 MED ORDER — SODIUM CHLORIDE 0.9% IV SOLUTION: Freq: Once | INTRAVENOUS | Status: DC

## 2019-10-14 MED ORDER — LACTATED RINGERS IV SOLN: INTRAVENOUS | Status: DC

## 2019-10-14 MED ORDER — SODIUM CHLORIDE FLUSH 0.9 % IV SOLN
INTRAVENOUS | Status: DC | PRN
  Administered 2019-10-14: 100 mL

## 2019-10-14 MED ORDER — PHENYLEPHRINE HCL (PRESSORS) 10 MG/ML IV SOLN
INTRAVENOUS | Status: DC | PRN
  Administered 2019-10-14: 80 ug via INTRAVENOUS
  Administered 2019-10-14: 200 ug via INTRAVENOUS
  Administered 2019-10-14: 120 ug via INTRAVENOUS
  Administered 2019-10-14: 80 ug via INTRAVENOUS
  Administered 2019-10-14: 200 ug via INTRAVENOUS

## 2019-10-14 MED ORDER — CHLORHEXIDINE GLUCONATE 0.12 % MT SOLN
15.0000 mL | Freq: Once | OROMUCOSAL | Status: AC
  Administered 2019-10-14: 15 mL via OROMUCOSAL
  Filled 2019-10-14: qty 15

## 2019-10-14 MED ORDER — KETOROLAC TROMETHAMINE 30 MG/ML IJ SOLN
30.0000 mg | Freq: Four times a day (QID) | INTRAMUSCULAR | Status: AC | PRN
  Administered 2019-10-14 – 2019-10-19 (×11): 30 mg via INTRAVENOUS
  Filled 2019-10-14 (×12): qty 1

## 2019-10-14 MED ORDER — FLUTICASONE FUROATE-VILANTEROL 100-25 MCG/INH IN AEPB
1.0000 | INHALATION_SPRAY | Freq: Every day | RESPIRATORY_TRACT | Status: DC
  Administered 2019-10-14 – 2019-10-23 (×6): 1 via RESPIRATORY_TRACT
  Filled 2019-10-14: qty 28

## 2019-10-14 MED ORDER — PHENYLEPHRINE 40 MCG/ML (10ML) SYRINGE FOR IV PUSH (FOR BLOOD PRESSURE SUPPORT)
PREFILLED_SYRINGE | INTRAVENOUS | Status: AC
  Filled 2019-10-14: qty 10

## 2019-10-14 MED ORDER — PHENYLEPHRINE 40 MCG/ML (10ML) SYRINGE FOR IV PUSH (FOR BLOOD PRESSURE SUPPORT)
PREFILLED_SYRINGE | INTRAVENOUS | Status: AC
  Filled 2019-10-14: qty 20

## 2019-10-14 MED ORDER — HEMOSTATIC AGENTS (NO CHARGE) OPTIME
TOPICAL | Status: DC | PRN
  Administered 2019-10-14: 1 via TOPICAL

## 2019-10-14 MED ORDER — LIDOCAINE 2% (20 MG/ML) 5 ML SYRINGE
INTRAMUSCULAR | Status: DC | PRN
  Administered 2019-10-14: 100 mg via INTRAVENOUS

## 2019-10-14 MED ORDER — DEXAMETHASONE SODIUM PHOSPHATE 10 MG/ML IJ SOLN
INTRAMUSCULAR | Status: AC
  Filled 2019-10-14: qty 1

## 2019-10-14 MED ORDER — ACETAMINOPHEN 500 MG PO TABS
1000.0000 mg | ORAL_TABLET | Freq: Once | ORAL | Status: AC
  Administered 2019-10-14: 1000 mg via ORAL
  Filled 2019-10-14: qty 2

## 2019-10-14 MED ORDER — SODIUM CHLORIDE 0.9 % IV SOLN: INTRAVENOUS | Status: DC | PRN

## 2019-10-14 MED ORDER — ONDANSETRON HCL 4 MG/2ML IJ SOLN: 4.0000 mg | INTRAMUSCULAR | Status: DC | PRN

## 2019-10-14 MED ORDER — DEXTROSE 5 % IV SOLN
INTRAVENOUS | Status: DC | PRN
  Administered 2019-10-14 (×4): 2 g via INTRAVENOUS

## 2019-10-14 MED ORDER — PHENYLEPHRINE HCL (PRESSORS) 10 MG/ML IV SOLN
INTRAVENOUS | Status: AC
  Filled 2019-10-14: qty 1

## 2019-10-14 MED ORDER — PROMETHAZINE HCL 25 MG/ML IJ SOLN: 6.2500 mg | INTRAMUSCULAR | Status: DC | PRN

## 2019-10-14 MED ORDER — FENTANYL CITRATE (PF) 100 MCG/2ML IJ SOLN
25.0000 ug | INTRAMUSCULAR | Status: DC | PRN
  Administered 2019-10-14: 25 ug via INTRAVENOUS

## 2019-10-14 MED ORDER — ALBUMIN HUMAN 5 % IV SOLN: INTRAVENOUS | Status: DC | PRN

## 2019-10-14 MED ORDER — MIDAZOLAM HCL 5 MG/5ML IJ SOLN
INTRAMUSCULAR | Status: DC | PRN
  Administered 2019-10-14: 2 mg via INTRAVENOUS

## 2019-10-14 MED ORDER — MORPHINE SULFATE (PF) 2 MG/ML IV SOLN
1.0000 mg | INTRAVENOUS | Status: DC | PRN
  Administered 2019-10-14 (×2): 1 mg via INTRAVENOUS
  Administered 2019-10-15: 2 mg via INTRAVENOUS
  Filled 2019-10-14 (×3): qty 1

## 2019-10-14 MED ORDER — INSULIN ASPART 100 UNIT/ML ~~LOC~~ SOLN
0.0000 [IU] | SUBCUTANEOUS | Status: DC
  Administered 2019-10-14: 4 [IU] via SUBCUTANEOUS
  Administered 2019-10-14 – 2019-10-16 (×9): 2 [IU] via SUBCUTANEOUS
  Administered 2019-10-16: 4 [IU] via SUBCUTANEOUS
  Administered 2019-10-16: 2 [IU] via SUBCUTANEOUS
  Administered 2019-10-17: 4 [IU] via SUBCUTANEOUS
  Administered 2019-10-17 (×2): 2 [IU] via SUBCUTANEOUS
  Administered 2019-10-17: 4 [IU] via SUBCUTANEOUS
  Administered 2019-10-17: 2 [IU] via SUBCUTANEOUS
  Administered 2019-10-18: 4 [IU] via SUBCUTANEOUS
  Administered 2019-10-18 (×4): 2 [IU] via SUBCUTANEOUS
  Administered 2019-10-18 – 2019-10-19 (×2): 4 [IU] via SUBCUTANEOUS
  Administered 2019-10-19 (×2): 2 [IU] via SUBCUTANEOUS
  Administered 2019-10-19: 4 [IU] via SUBCUTANEOUS
  Administered 2019-10-19 – 2019-10-20 (×4): 2 [IU] via SUBCUTANEOUS
  Administered 2019-10-20 (×3): 4 [IU] via SUBCUTANEOUS
  Administered 2019-10-20: 8 [IU] via SUBCUTANEOUS
  Administered 2019-10-21: 4 [IU] via SUBCUTANEOUS
  Administered 2019-10-21: 2 [IU] via SUBCUTANEOUS
  Administered 2019-10-21: 4 [IU] via SUBCUTANEOUS
  Administered 2019-10-22 (×2): 2 [IU] via SUBCUTANEOUS
  Administered 2019-10-22 (×3): 4 [IU] via SUBCUTANEOUS
  Administered 2019-10-22 – 2019-10-23 (×5): 2 [IU] via SUBCUTANEOUS
  Administered 2019-10-23: 4 [IU] via SUBCUTANEOUS
  Administered 2019-10-23 – 2019-10-24 (×2): 2 [IU] via SUBCUTANEOUS
  Administered 2019-10-24 (×4): 4 [IU] via SUBCUTANEOUS
  Administered 2019-10-25: 2 [IU] via SUBCUTANEOUS
  Administered 2019-10-25: 8 [IU] via SUBCUTANEOUS
  Administered 2019-10-25: 2 [IU] via SUBCUTANEOUS
  Administered 2019-10-25 (×2): 4 [IU] via SUBCUTANEOUS
  Administered 2019-10-26 (×2): 2 [IU] via SUBCUTANEOUS
  Administered 2019-10-26: 4 [IU] via SUBCUTANEOUS
  Administered 2019-10-26 (×2): 2 [IU] via SUBCUTANEOUS
  Administered 2019-10-26 – 2019-10-27 (×3): 4 [IU] via SUBCUTANEOUS
  Administered 2019-10-27 – 2019-10-30 (×17): 2 [IU] via SUBCUTANEOUS
  Administered 2019-10-31: 4 [IU] via SUBCUTANEOUS
  Administered 2019-10-31 (×2): 2 [IU] via SUBCUTANEOUS
  Administered 2019-10-31: 4 [IU] via SUBCUTANEOUS
  Administered 2019-10-31 (×2): 2 [IU] via SUBCUTANEOUS
  Administered 2019-11-01 (×2): 4 [IU] via SUBCUTANEOUS
  Administered 2019-11-01 (×3): 2 [IU] via SUBCUTANEOUS
  Administered 2019-11-01: 4 [IU] via SUBCUTANEOUS
  Administered 2019-11-02: 2 [IU] via SUBCUTANEOUS
  Administered 2019-11-02 (×2): 4 [IU] via SUBCUTANEOUS
  Administered 2019-11-02: 2 [IU] via SUBCUTANEOUS
  Administered 2019-11-02 (×2): 4 [IU] via SUBCUTANEOUS
  Administered 2019-11-03 (×2): 2 [IU] via SUBCUTANEOUS
  Administered 2019-11-03: 4 [IU] via SUBCUTANEOUS
  Administered 2019-11-03 (×3): 2 [IU] via SUBCUTANEOUS
  Administered 2019-11-04: 4 [IU] via SUBCUTANEOUS
  Administered 2019-11-04 (×5): 8 [IU] via SUBCUTANEOUS
  Administered 2019-11-05 – 2019-11-06 (×7): 4 [IU] via SUBCUTANEOUS
  Administered 2019-11-06: 8 [IU] via SUBCUTANEOUS
  Administered 2019-11-06 (×3): 4 [IU] via SUBCUTANEOUS
  Administered 2019-11-06: 8 [IU] via SUBCUTANEOUS
  Administered 2019-11-07: 2 [IU] via SUBCUTANEOUS
  Administered 2019-11-07: 4 [IU] via SUBCUTANEOUS
  Administered 2019-11-07 (×2): 8 [IU] via SUBCUTANEOUS
  Administered 2019-11-07: 4 [IU] via SUBCUTANEOUS
  Administered 2019-11-08 (×2): 2 [IU] via SUBCUTANEOUS
  Administered 2019-11-08: 4 [IU] via SUBCUTANEOUS

## 2019-10-14 MED ORDER — ALBUTEROL SULFATE (2.5 MG/3ML) 0.083% IN NEBU
2.5000 mg | INHALATION_SOLUTION | Freq: Four times a day (QID) | RESPIRATORY_TRACT | Status: AC | PRN
  Administered 2019-10-16: 2.5 mg via RESPIRATORY_TRACT
  Filled 2019-10-14: qty 3

## 2019-10-14 MED ORDER — CHLORHEXIDINE GLUCONATE 0.12 % MT SOLN
15.0000 mL | OROMUCOSAL | Status: AC
  Administered 2019-10-14: 15 mL via OROMUCOSAL
  Filled 2019-10-14: qty 15

## 2019-10-14 MED ORDER — DEXTROSE-NACL 5-0.9 % IV SOLN: INTRAVENOUS | Status: DC

## 2019-10-14 MED ORDER — SUGAMMADEX SODIUM 200 MG/2ML IV SOLN
INTRAVENOUS | Status: DC | PRN
  Administered 2019-10-14 (×2): 200 mg via INTRAVENOUS

## 2019-10-14 MED ORDER — INSULIN REGULAR(HUMAN) IN NACL 100-0.9 UT/100ML-% IV SOLN
INTRAVENOUS | Status: AC
  Administered 2019-10-14: 9 [IU]/h via INTRAVENOUS
  Filled 2019-10-14: qty 100

## 2019-10-14 MED ORDER — MIDAZOLAM HCL 2 MG/2ML IJ SOLN
INTRAMUSCULAR | Status: AC
  Filled 2019-10-14: qty 2

## 2019-10-14 MED ORDER — PHENYLEPHRINE HCL-NACL 10-0.9 MG/250ML-% IV SOLN
INTRAVENOUS | Status: DC | PRN
  Administered 2019-10-14: 15 ug/min via INTRAVENOUS
  Administered 2019-10-14: 50 ug/min via INTRAVENOUS

## 2019-10-14 SURGICAL SUPPLY — 142 items
ADH SKN CLS APL DERMABOND .7 (GAUZE/BANDAGES/DRESSINGS) ×6
APL PRP STRL LF DISP 70% ISPRP (MISCELLANEOUS) ×6
BLADE SURG 11 STRL SS (BLADE) ×5 IMPLANT
CANISTER SUCT 3000ML PPV (MISCELLANEOUS) ×10 IMPLANT
CANNULA REDUC XI 12-8 STAPL (CANNULA) ×4
CANNULA REDUC XI 12-8MM STAPL (CANNULA) ×1
CANNULA REDUCER 12-8 DVNC XI (CANNULA) ×3 IMPLANT
CATH ROBINSON RED A/P 18FR (CATHETERS) ×2 IMPLANT
CATH THORACIC 28FR (CATHETERS) IMPLANT
CHLORAPREP W/TINT 26 (MISCELLANEOUS) ×10 IMPLANT
CNTNR URN SCR LID CUP LEK RST (MISCELLANEOUS) IMPLANT
CONN ST 1/4X3/8  BEN (MISCELLANEOUS) ×5
CONN ST 1/4X3/8 BEN (MISCELLANEOUS) ×3 IMPLANT
CONT SPEC 4OZ STRL OR WHT (MISCELLANEOUS) ×35
COUNTER NEEDLE 20 DBL MAG RED (NEEDLE) ×3 IMPLANT
COVER TIP SHEARS 8 DVNC (MISCELLANEOUS) ×3 IMPLANT
COVER TIP SHEARS 8MM DA VINCI (MISCELLANEOUS)
DEFOGGER SCOPE WARMER CLEARIFY (MISCELLANEOUS) ×5 IMPLANT
DERMABOND ADVANCED (GAUZE/BANDAGES/DRESSINGS) ×4
DERMABOND ADVANCED .7 DNX12 (GAUZE/BANDAGES/DRESSINGS) ×6 IMPLANT
DEVICE SUTURE ENDOST 10MM (ENDOMECHANICALS) ×3 IMPLANT
DRAIN CHANNEL 28F RND 3/8 FF (WOUND CARE) ×4 IMPLANT
DRAIN PENROSE 1/4X12 LTX STRL (WOUND CARE) ×5 IMPLANT
DRAPE ARM DVNC X/XI (DISPOSABLE) ×12 IMPLANT
DRAPE COLUMN DVNC XI (DISPOSABLE) ×3 IMPLANT
DRAPE CV SPLIT W-CLR ANES SCRN (DRAPES) ×10 IMPLANT
DRAPE DA VINCI XI ARM (DISPOSABLE) ×20
DRAPE DA VINCI XI COLUMN (DISPOSABLE) ×5
DRAPE HALF SHEET 40X57 (DRAPES) ×10 IMPLANT
DRAPE INCISE IOBAN 66X45 STRL (DRAPES) ×12 IMPLANT
DRAPE ORTHO SPLIT 77X108 STRL (DRAPES) ×10
DRAPE SURG ORHT 6 SPLT 77X108 (DRAPES) ×6 IMPLANT
ELECT REM PT RETURN 9FT ADLT (ELECTROSURGICAL) ×5
ELECTRODE REM PT RTRN 9FT ADLT (ELECTROSURGICAL) ×3 IMPLANT
FELT TEFLON 1X6 (MISCELLANEOUS) IMPLANT
GAUZE KITTNER 4X5 RF (MISCELLANEOUS) ×7 IMPLANT
GAUZE PACKING IODOFORM 1X5 (PACKING) ×4 IMPLANT
GAUZE SPONGE 4X4 12PLY STRL (GAUZE/BANDAGES/DRESSINGS) ×4 IMPLANT
GLOVE BIO SURGEON STRL SZ7.5 (GLOVE) ×17 IMPLANT
GLOVE BIOGEL PI IND STRL 6.5 (GLOVE) ×10 IMPLANT
GLOVE BIOGEL PI INDICATOR 6.5 (GLOVE) ×10
GLOVE ECLIPSE 7.5 STRL STRAW (GLOVE) ×4 IMPLANT
GLOVE SURG SS PI 6.5 STRL IVOR (GLOVE) ×3 IMPLANT
GLOVE SURG SS PI 8.0 STRL IVOR (GLOVE) ×2 IMPLANT
GLOVE SURG SYN 8.0 (GLOVE) ×5 IMPLANT
GLOVE SURG SYN 8.0 PF PI (GLOVE) IMPLANT
GOWN STRL NON-REIN LRG LVL3 (GOWN DISPOSABLE) ×4 IMPLANT
GOWN STRL REUS W/ TWL LRG LVL3 (GOWN DISPOSABLE) ×6 IMPLANT
GOWN STRL REUS W/ TWL XL LVL3 (GOWN DISPOSABLE) ×9 IMPLANT
GOWN STRL REUS W/TWL 2XL LVL3 (GOWN DISPOSABLE) ×4 IMPLANT
GOWN STRL REUS W/TWL LRG LVL3 (GOWN DISPOSABLE) ×25
GOWN STRL REUS W/TWL XL LVL3 (GOWN DISPOSABLE) ×20
GRASPER ENDOPATH ANVIL 10MM (MISCELLANEOUS) ×3 IMPLANT
GRASPER SUT TROCAR 14GX15 (MISCELLANEOUS) ×1 IMPLANT
HANDLE STAPLE  ENDO EGIA 4 STD (STAPLE) ×5
HANDLE STAPLE ENDO EGIA 4 STD (STAPLE) ×2 IMPLANT
HEMOSTAT SURGICEL 2X14 (HEMOSTASIS) ×5 IMPLANT
IRRIGATOR SUCT 8 DISP DVNC XI (IRRIGATION / IRRIGATOR) ×2 IMPLANT
IRRIGATOR SUCTION 8MM XI DISP (IRRIGATION / IRRIGATOR) ×10
IV NS 1000ML (IV SOLUTION)
IV NS 1000ML BAXH (IV SOLUTION) IMPLANT
KIT BASIN OR (CUSTOM PROCEDURE TRAY) ×5 IMPLANT
KIT DILATOR VASC 18G NDL (KITS) ×4 IMPLANT
KIT TUBE JEJUNAL 16FR (CATHETERS) IMPLANT
KIT TURNOVER KIT B (KITS) ×5 IMPLANT
NDL 18GX1X1/2 (RX/OR ONLY) (NEEDLE) IMPLANT
NEEDLE 18GX1X1/2 (RX/OR ONLY) (NEEDLE) IMPLANT
NS IRRIG 1000ML POUR BTL (IV SOLUTION) ×10 IMPLANT
PACK CHEST (CUSTOM PROCEDURE TRAY) ×5 IMPLANT
PAD ARMBOARD 7.5X6 YLW CONV (MISCELLANEOUS) ×10 IMPLANT
PORT ACCESS TROCAR AIRSEAL 12 (TROCAR) ×3 IMPLANT
PORT ACCESS TROCAR AIRSEAL 5M (TROCAR) ×2
PROBE NERVBE PRASS .33 (MISCELLANEOUS) ×2 IMPLANT
RELOAD STAPLE 30 PURP MED/THCK (STAPLE) IMPLANT
RELOAD STAPLE 45 2.5 WHT DVNC (STAPLE) IMPLANT
RELOAD STAPLE 45 3.5 BLU DVNC (STAPLE) IMPLANT
RELOAD STAPLE 45 4.3 GRN DVNC (STAPLE) IMPLANT
RELOAD STAPLER 2.5X45 WHT DVNC (STAPLE) ×6 IMPLANT
RELOAD STAPLER 3.5X45 BLU DVNC (STAPLE) ×18 IMPLANT
RELOAD STAPLER 4.3X45 GRN DVNC (STAPLE) ×24 IMPLANT
RELOAD TRI 2.0 30 MED THCK SUL (STAPLE) ×5 IMPLANT
RETRACTOR WOUND ALXS 19CM XSML (INSTRUMENTS) ×3 IMPLANT
RTRCTR WOUND ALEXIS 18CM SML (INSTRUMENTS) ×5
RTRCTR WOUND ALEXIS 19CM XSML (INSTRUMENTS) ×5
SAVER CELL AAL HAEMONETICS (INSTRUMENTS) IMPLANT
SCISSORS LAP 5X35 DISP (ENDOMECHANICALS) ×3 IMPLANT
SEAL CANN UNIV 5-8 DVNC XI (MISCELLANEOUS) ×9 IMPLANT
SEAL XI 5MM-8MM UNIVERSAL (MISCELLANEOUS) ×15
SEALER LIGASURE MARYLAND 30 (ELECTROSURGICAL) ×2 IMPLANT
SEALER SYNCHRO 8 IS4000 DV (MISCELLANEOUS) ×5
SEALER SYNCHRO 8 IS4000 DVNC (MISCELLANEOUS) IMPLANT
SET IRRIG TUBING LAPAROSCOPIC (IRRIGATION / IRRIGATOR) IMPLANT
SET TRI-LUMEN FLTR TB AIRSEAL (TUBING) ×5 IMPLANT
SHEET MEDIUM DRAPE 40X70 STRL (DRAPES) ×3 IMPLANT
STAPLER 45 DA VINCI SURE FORM (STAPLE) ×10
STAPLER 45 SUREFORM DVNC (STAPLE) ×4 IMPLANT
STAPLER CANNULA SEAL DVNC XI (STAPLE) ×3 IMPLANT
STAPLER CANNULA SEAL XI (STAPLE) ×5
STAPLER CIRC 25MM 4.8MM THK (STAPLE) IMPLANT
STAPLER RELOAD 2.5X45 WHITE (STAPLE) ×10
STAPLER RELOAD 2.5X45 WHT DVNC (STAPLE) ×6
STAPLER RELOAD 3.5X45 BLU DVNC (STAPLE) ×18
STAPLER RELOAD 3.5X45 BLUE (STAPLE) ×30
STAPLER RELOAD 4.3X45 GREEN (STAPLE) ×40
STAPLER RELOAD 4.3X45 GRN DVNC (STAPLE) ×24
STAPLER TRANS-ORAL 25MM EEA (STAPLE) IMPLANT
STOPCOCK 4 WAY LG BORE MALE ST (IV SETS) ×5 IMPLANT
SUCTION FRAZIER HANDLE 10FR (MISCELLANEOUS) ×5
SUCTION TUBE FRAZIER 10FR DISP (MISCELLANEOUS) IMPLANT
SUT ETHIBOND 0 36 GRN (SUTURE) IMPLANT
SUT PDS AB 1 TP1 96 (SUTURE) ×2 IMPLANT
SUT SILK  1 MH (SUTURE) ×15
SUT SILK 1 MH (SUTURE) ×5 IMPLANT
SUT SILK 2 0 SH (SUTURE) ×3 IMPLANT
SUT SILK 3 0 SH CR/8 (SUTURE) ×4 IMPLANT
SUT SURGIDAC NAB ES-9 0 48 120 (SUTURE) ×16 IMPLANT
SUT VIC AB 2-0 CT1 36 (SUTURE) ×4 IMPLANT
SUT VIC AB 2-0 SH 27 (SUTURE) ×5
SUT VIC AB 2-0 SH 27XBRD (SUTURE) ×1 IMPLANT
SUT VIC AB 3-0 SH 27 (SUTURE) ×25
SUT VIC AB 3-0 SH 27X BRD (SUTURE) ×18 IMPLANT
SUT VIC AB 3-0 SH 8-18 (SUTURE) ×8 IMPLANT
SUT VIC AB 3-0 X1 27 (SUTURE) ×8 IMPLANT
SUT VICRYL 0 UR6 27IN ABS (SUTURE) ×12 IMPLANT
SUT VLOC 180 0 6IN GS21 (SUTURE) ×3 IMPLANT
SUT VLOC 180 0 9IN  GS21 (SUTURE)
SUT VLOC 180 0 9IN GS21 (SUTURE) ×3 IMPLANT
SYR 10ML LL (SYRINGE) ×5 IMPLANT
SYR 20ML LL LF (SYRINGE) ×7 IMPLANT
SYR 50ML LL SCALE MARK (SYRINGE) ×5 IMPLANT
SYSTEM SAHARA CHEST DRAIN ATS (WOUND CARE) ×5 IMPLANT
TAPE CLOTH SURG 4X10 WHT LF (GAUZE/BANDAGES/DRESSINGS) ×8 IMPLANT
TOWEL GREEN STERILE (TOWEL DISPOSABLE) ×9 IMPLANT
TOWEL GREEN STERILE FF (TOWEL DISPOSABLE) ×5 IMPLANT
TRAY FOLEY MTR SLVR 16FR STAT (SET/KITS/TRAYS/PACK) ×5 IMPLANT
TROCAR XCEL BLADELESS 5X75MML (TROCAR) ×5 IMPLANT
TROCAR XCEL NON-BLD 5MMX100MML (ENDOMECHANICALS) ×2 IMPLANT
TUBE ENDOTRAC EMG 7X10.2 (MISCELLANEOUS) ×2 IMPLANT
TUBE J 18FR (TUBING) ×3 IMPLANT
TUBING EXTENTION W/L.L. (IV SETS) ×5 IMPLANT
TUBING LAP HI FLOW INSUFFLATIO (TUBING) ×1 IMPLANT
WATER STERILE IRR 1000ML POUR (IV SOLUTION) ×8 IMPLANT

## 2019-10-14 NOTE — Op Note (Signed)
[address]       [place] [address]             [phone]        11/01/2019  Patient:  [name] Pre-Op Dx: Stage II mid esophageal squamous cancer Post-op Dx: Same Procedure: - Bronchoscopy  - Robotic assisted laparoscopy - Robotic assisted thoracoscopy - [place] esophagectomy - Pyloroplasty - Open jejunostomy tube placement 89F - Intercostal nerve block -   Surgeon and Role:      * Marguerite Jarboe, Lucile Crater, MD - Primary    * Dr. 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 She subsequently underwent neoadjuvant chemoradiation which per her report was completed in July of this year.Over the course of her neoadjuvant therapy she lost approximately 24 pounds. She currently has a PEG tube in place and has needed to resume tube feeds due to odynophagia and dysphagia. She describes a burning sensation with all meals. She also has regurgitation of food as well. Findings: On bronchoscopy, the carina, left and right mainstem bronchus appeared normal.  There was no obvious tumor invasion.  Were able to mobilize the esophagus.  It was very adherent to the posterior surface of the trachea around level 7.  Great care was taken and we were able to fully mobilize the esophagus up to the thoracic inlet.    During laparoscopy there were some adhesions that were encountered upon entry.  Another 5 mm port was placed in the left upper quadrant we were then able to lyse all the abdominal adhesions so that we could place a robotic ports.  We created the gastric conduit without much difficulty.    When we went back down to place the jejunostomy tube the small  bowel was matted.  We created a small supraumbilical incision to access the small bowel safely.  We were not able to pass the jejunostomy tube very far distally due to adhesions down of the pelvis, thus trimmed the jejunostomy tube so that it would fit appropriately.  Operative Technique: After the risks, benefits and alternatives were thoroughly discussed, the patient was brought to the operative theatre.  Anesthesia was induced, and the the bronchoscope was passed down through the endotracheal tube to inspect the mucosal surface of the trachea.  There is no obvious tumor invasion in the carina or lateral mainstem bronchi.  The patient was then placed in a left lateral decubitus position then prepped and draped in normal sterile fashion.  An appropriate surgical pause was performed, and pre-operative antibiotics were dosed accordingly.  We placed our robotic ports to triangulate the esophagus and the posterior mediastinum.  The robot was then docked.  We began by dividing the inferior pulmonary ligament and then mobilized the esophagus with a combination of blunt dissection and LigaSure division.  The esophagus was very adherent to the posterior trachea we took a great deal of time to ensure that we did not injure the left mainstem bronchus the membranous portion of the trachea.  A Penrose drain was then used to encircle the esophagus to aid with our dissection.  We were able to mobilize it up to the azygos vein.  Next using the robotic stapler the azygos vein was then divided continued our dissection up to the thoracic inlet.  Once we had achieved full mobilization placed the Penrose drain output in the thoracic inlet and undocked the robot.  An intercostal nerve block was then performed and a 28 French chest tube was then placed.  Meticulous hemostasis was obtained and the ports were closed with absorbable suture.    The patient was then placed in a supine position, and the abdomen and left neck were then  prepped and draped in normal sterile fashion.  We began with a 1 cm incision 15 cm caudad from the xiphoid and slightly lateral to the umbilicus.  Using an Optiview we entered the peritoneal space.  Patient had previously had a lower midline incision for small bowel obstruction thus we encountered multiple adhesions.  A 5 mm port was placed in the left upper quadrant and we used this to safely divide all the adhesions.  Once completed we placed our remaining robotic ports and focused our attention first to the stomach.  Her liver retractor was placed to expose the esophageal hiatus.  The robot was then docked after the patient was placed in steep reverse Trendelenburg.  The stomach was still tethered to the abdominal wall from the previous PEG site.  This was divided with the robotic stapler.    We we then divided the gastrohepatic ligament to expose the right diaphragmatic crus and then dissected the esophagus into the mediastinum.  We then divided the short gastrics and moved towards the right crus and completed our dissection along the esophageal hiatus.  A Penrose drain was then used to encircle the the esophagus and we continued our dissection up into the mediastinum.  The stomach was then retracted superiorly, and we mobilized it off of the pancreas.  The left gastric artery was then isolated and divided with a robotic stabler.  ICG was then injected through his central line, and good blood flow to the stomach was evident.  We then marked an area away from the right gastroepiploic artery, and began to divide the omentum.    Once we achieved good mobilization, we focused our attention on the pylorus.  A 3cm longitudinal incision in the serosa was made, and the muscle and mucosa was carefully divided.  We then performed a pyloroplasty in a standard Heineke-Mikulicz fashion.  We then began to tubularized the gastric conduit with several fires of the robotic stapler.  Once complete, the conduit was then  attached to the distal end of the specimen.  We then undocked the robot, after removing all instruments, and the liver retractor.  We then focused our attention to the left neck.  A 3 cm incision was made anterior to the sternocleidomastoid muscle and this was carried down with a combination of Bovie cautery and blunt dissection to the strap muscles.  The Nims nerve stimulator was used to ensure that we did not injure the recurrent laryngeal nerve.  The strap muscles were divided and an NG tube was passed in the down through the esophagus.  The carotid artery was retracted laterally and we identified the esophagus.  It was encircled with a Penrose drain and we then mobilize it down into the thoracic inlet.  Once we had achieved full mobilization we were able to grasp the Penrose drain that was placed in the chest and continued to mobilize the esophagus up through our neck incision.  The gastric conduit had good length and we  were able to pull it up through our incision as well.  We assured proper orientation of the gastric conduit prior to dividing our cervical esophagus with Bovie cautery.  Next we placed several stay sutures in the cervical esophagus and the gastric conduit and opened our conduit using Bovie cautery.  An Endo GIA stapler was then used to create the back wall between the cervical esophagus and the gastric conduit.  The front wall of the anastomosis was then closed using several interrupted Vicryl sutures after we passed the NG tube distally.  A second layer of silk suture was then used to reinforce the front wall of the anastomosis.  A Penrose drain was then placed next to the anastomosis and passed out through the skin.  The platysma was reapproximated with absorbable suture.  The skin was loosely closed.  We then focused our attention back to the abdomen and reinsufflated the abdomen but due to the adhesions were unable to identify the small bowel and track it back up to the ligament of  Treitz safely.  We then elected to create a 5 cm supraumbilical incision to perform an open jejunostomy tube.  While passing the jejunostomy tube there was an area of distal matted small bowel where we were unable to advance the jejunostomy tube much further.  It was then trimmed to length and passed to the abdominal wall.  The small bowel was then tacked to the abdominal wall to ensure that it would not volvulized.  The incision was then closed with a #1 looped PDS to reapproximate the fascia, and absorbable suture to close the skin and soft tissue.  All the port sites were also closed with absorbable suture.  The patient tolerated the procedure without any immediate complications, and was transferred to the PACU in stable condition.  Uldine Fuster Bary Leriche

## 2019-10-14 NOTE — Brief Op Note (Signed)
10/21/2019  12:19 PM  PATIENT:  [name]  69 y.o. female  PRE-OPERATIVE DIAGNOSIS:  esophageal cancer  POST-OPERATIVE DIAGNOSIS:  esophageal cancer  PROCEDURE:  Procedure(s): XI ROBOTIC ASSISTED [place] ESOPHAGECTOMY (N/A) VIDEO BRONCHOSCOPY (N/A) JEJUNOSTOMY (N/A)  SURGEON:  Surgeon(s) and Role:    * Lightfoot, Lucile Crater, MD - Primary    * [name], MD  PHYSICIAN ASSISTANT: Earnesteen Birnie PA-C  ANESTHESIA:   general  EBL:  25 mL   BLOOD ADMINISTERED:none  DRAINS: BLAKE DRAIN RIGHT CHEST  LOCAL MEDICATIONS USED:  EXPAREL  SPECIMEN:  ESOPHAGECTOMY AND MULTIPLE LN SAMPLES  DISPOSITION OF SPECIMEN:  PATHOLOGY  COUNTS:  YES  TOURNIQUET:  * No tourniquets in log *  DICTATION: .Dragon Dictation  PLAN OF CARE: Admit to inpatient   PATIENT DISPOSITION:  PACU - hemodynamically stable.   Delay start of Pharmacological VTE agent (>24hrs) due to surgical blood loss or risk of bleeding: yes  COMPLICATIONS: NO KNOWN

## 2019-10-14 NOTE — Plan of Care (Signed)
°  Problem: Education: Goal: Knowledge of General Education information will improve Description: Including pain rating scale, medication(s)/side effects and non-pharmacologic comfort measures Outcome: Progressing   Problem: Clinical Measurements: Goal: Ability to maintain clinical measurements within normal limits will improve Outcome: Progressing   Problem: Clinical Measurements: Goal: Diagnostic test results will improve Outcome: Progressing   Problem: Coping: Goal: Level of anxiety will decrease Outcome: Progressing   Problem: Pain Managment: Goal: General experience of comfort will improve Outcome: Progressing   Problem: Skin Integrity: Goal: Risk for impaired skin integrity will decrease Outcome: Progressing

## 2019-10-14 NOTE — H&P (Signed)
[address]       [place] [address]             [phone]       No events since her last clinic appointment  Vitals:   10/13/2019 0605  BP: (!) 138/54  Pulse: 79  Resp: 18  Temp: 98.6 F (37 C)  SpO2: 98%   Alert NAD Sinus EWOB  OR today for bronchoscopy, robotic assisted 3 field esophagectomy, jejunostomy tube placement  Per my clinic note   [facility] Record #[id number] Date of Birth: 01/11/1950  Referring: Marice Potter, MD Primary Care: Martinique, Sarah T, MD Primary Cardiologist: Berniece Salines, DO  Chief Complaint:        Chief Complaint  Patient presents with  . Esophageal Cancer    Surgical consult, PET Scan 05/18/19, C/A/P CTA 07/25/19, upper GI abd BX 05/09/19    History of Present Illness:    [name] 69 y.o. 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  She subsequently underwent neoadjuvant chemoradiation which per her report was completed in July of this year.  Over the course of her neoadjuvant therapy she lost approximately 24 pounds.  She currently has a PEG tube in place and has needed to resume tube feeds due to odynophagia and dysphagia.  She describes a burning sensation with all meals.  She also has regurgitation of food as well.  She previously had a productive cough but this is since improved.     Smoking Hx: Quit cigarettes in May of this year   Zubrod Score: At the time of surgery this patient's most appropriate activity status/level should be described as: [] ?    0    Normal activity, no symptoms [x] ?    1    Restricted in physical strenuous activity but ambulatory, able to do out light work [] ?    2    Ambulatory and capable of self care, unable to do work activities, up and about               >50 % of waking hours                              [] ?    3     Only limited self care, in bed greater than 50% of waking hours [] ?    4    Completely disabled, no self care, confined to bed or chair [] ?    5    Moribund       Past Medical History:  Diagnosis Date  . Asthma   . COPD (chronic obstructive pulmonary disease) ([place])   . Diastolic dysfunction   . Diverticulosis   . Fatty liver   . GERD without esophagitis   . Hx of Clostridium difficile infection   . Hx of colonic polyps   . Hx of small bowel obstruction   . Hypertension, benign   . Hypothyroidism   . Irritable bowel syndrome   . Lumbar herniated disc   . Mixed hyperlipidemia   . Osteoarthritis          Past Surgical History:  Procedure Laterality Date  . ABDOMINAL HYSTERECTOMY  2002  . APPENDECTOMY  2005  . CARPAL TUNNEL RELEASE Bilateral   . LEFT HEART  CATH AND CORONARY ANGIOGRAPHY N/A 12/28/2018   Procedure: LEFT HEART CATH AND CORONARY ANGIOGRAPHY;  Surgeon: Belva Crome, MD;  Location: [facility];  Service: Cardiovascular;  Laterality: N/A;  . SMALL INTESTINE SURGERY  11/26/2012   Bowel obstruction, Dr. Pauletta Browns  . TUBAL LIGATION  1980         Family History  Problem Relation Age of Onset  . Ovarian cancer Sister   . Lung cancer Brother   . Stomach cancer Maternal Grandmother   . Colon cancer Daughter      Social History       Tobacco Use  Smoking Status Former Smoker  . Packs/day: 1.00  . Quit date: 04/2019  . Years since quitting: 0.4  Smokeless Tobacco Never Used    Social History      Substance and Sexual Activity  Alcohol Use Not Currently          Allergies  Allergen Reactions  . Honey Bee Venom Protein [Bee Venom] Anaphylaxis  . Ether Nausea And Vomiting  . Nickel Other (See Comments)    infection  . Other Other (See Comments)    Pt reports that she cannot have staples placed due to infection.  . Latex Rash          Current Outpatient Medications  Medication Sig Dispense  Refill  . Amino Acids-Protein Hydrolys (FEEDING SUPPLEMENT, PRO-STAT SUGAR FREE 64,) LIQD Place 30 mLs into feeding tube 2 (two) times daily. 887 mL 0  . Ascorbic Acid (VITAMIN C) 100 MG tablet Take 100 mg by mouth daily.    [name] aspirin EC 81 MG tablet Take 1 tablet (81 mg total) by mouth daily. 90 tablet 3  . BREO ELLIPTA 100-25 MCG/INH AEPB Inhale 1 puff into the lungs as directed.     [name] EPINEPHrine 0.3 mg/0.3 mL IJ SOAJ injection Inject 0.3 mg into the muscle as needed for anaphylaxis.    [name] gabapentin (NEURONTIN) 300 MG capsule Take 300 mg by mouth 3 (three) times daily.     . isosorbide mononitrate (IMDUR) 30 MG 24 hr tablet TAKE 1 TABLET BY MOUTH  DAILY 90 tablet 3  . levothyroxine (SYNTHROID) 75 MCG tablet Take 75 mcg by mouth daily before breakfast.     . metoprolol tartrate (LOPRESSOR) 25 MG tablet Take 0.5 tablets (12.5 mg total) by mouth 2 (two) times daily. 180 tablet 3  . mometasone (NASONEX) 50 MCG/ACT nasal spray Place 2 sprays into the nose daily.    . nitroGLYCERIN (NITROSTAT) 0.4 MG SL tablet Place 1 tablet (0.4 mg total) under the tongue every 5 (five) minutes as needed for chest pain. 90 tablet 3  . oxyCODONE-acetaminophen (PERCOCET) 10-325 MG tablet Take 1 tablet by mouth every 4 (four) hours as needed for pain.    . pantoprazole (PROTONIX) 40 MG tablet Take 40 mg by mouth daily.     . potassium chloride 20 MEQ/15ML (10%) SOLN Take 20 mEq by mouth daily.    . promethazine (PHENERGAN) 25 MG tablet Take 25 mg by mouth every 8 (eight) hours as needed.    . rifaximin (XIFAXAN) 550 MG TABS tablet Take 550 mg by mouth See admin instructions. Take 1 tablet (550 mg) by mouth twice daily for 2 weeks, hold for 4 months then resume cycle    . rosuvastatin (CRESTOR) 40 MG tablet TAKE 1 TABLET BY MOUTH  DAILY 90 tablet 3  . sucralfate (CARAFATE) 1 GM/10ML suspension Take 10 mLs (1 g total) by mouth  4 (four) times daily -  with meals and at bedtime. 420 mL 0  . Water For  Irrigation, Sterile (FREE WATER) SOLN Place 200 mLs into feeding tube every 8 (eight) hours. 1000 mL 0   No current facility-administered medications for this visit.    Review of Systems  Constitutional: Positive for malaise/fatigue and weight loss. Negative for chills and fever.  HENT: Positive for sore throat.   Respiratory: Positive for cough. Negative for shortness of breath.   Cardiovascular: Negative for chest pain.  Gastrointestinal: Positive for abdominal pain, heartburn and vomiting.  Neurological: Negative.   Psychiatric/Behavioral: Positive for depression. The patient is nervous/anxious.      PHYSICAL EXAMINATION: BP 113/72   Pulse 90   Temp 97.7 F (36.5 C) (Skin)   Resp 20   Ht 5\' 2"  (1.575 m)   Wt 161 lb (73 kg)   SpO2 96% Comment: RA  BMI 29.45 kg/m  Physical Exam Constitutional:      General: She is not in acute distress.    Appearance: She is ill-appearing. She is not toxic-appearing.     Comments: Appears stated age  HENT:     Head: Normocephalic and atraumatic.  Eyes:     Extraocular Movements: Extraocular movements intact.     Conjunctiva/sclera: Conjunctivae normal.  Cardiovascular:     Rate and Rhythm: Normal rate.  Pulmonary:     Effort: Pulmonary effort is normal. No respiratory distress.  Abdominal:     General: Abdomen is flat.     Palpations: Abdomen is soft.       Comments: PEG tube in the left upper quadrant Lower midline scar is well-healed  Musculoskeletal:     Cervical back: Normal range of motion.  Neurological:     General: No focal deficit present.     Mental Status: She is alert and oriented to person, place, and time.     Diagnostic Studies & Laboratory data:    CT Scan: CT scan from July 2021.  There is marked thickening of the mid to distal esophagus involving 9 cm.  There is interval development of groundglass opacities in the right upper lobe and superior segments of the right lower lobe.  She has a stable 3 mm  left upper lobe nodule PET/CT: Scan from May 18, 2019 was reviewed.  There is increased metabolic activity in the middle third of the esophagus with an SUV of 15.3.  The esophageal thickening begins at the level of the carina extends inferiorly over 5 cm.  Hypermetabolism terminates at the GE junction.  There is no hypermetabolic paraesophageal lymph nodes or mediastinal nodes.  There is a surgical anastomosis and the left abdomen which likely corresponds with her previous bowel resection in 2014. EGD/EUS: Performed on 05/09/2019.  There is an elongated mid esophageal mass from 28 to 33 cm.  Path: Squamous cell cancer of the esophagus. Radiation Hx: July 9     I have independently reviewed the above radiology studies  and reviewed the findings with the patient.   Recent Lab Findings: Recent Labs        Lab Results  Component Value Date   WBC 2.8 (L) 07/12/2019   HGB 10.0 (L) 07/12/2019   HCT 29.9 (L) 07/12/2019   PLT 201 07/12/2019   GLUCOSE 137 (H) 07/12/2019   CHOL  01/20/2007    96        ATP III CLASSIFICATION:  <200     mg/dL   Desirable  200-239  mg/dL   Borderline High  >=240    mg/dL   High          TRIG 159 (H) 01/20/2007   HDL 28 (L) 01/20/2007   LDLCALC  01/20/2007    36        Total Cholesterol/HDL:CHD Risk Coronary Heart Disease Risk Table                     Men   Women  1/2 Average Risk   3.4   3.3  Average Risk       5.0   4.4  2 X Average Risk   9.6   7.1  3 X Average Risk  23.4   11.0        Use the calculated Patient Ratio above and the CHD Risk Table to determine the patient's CHD Risk.        ATP III CLASSIFICATION (LDL):  <100     mg/dL   Optimal  100-129  mg/dL   Near or Above                    Optimal  130-159  mg/dL   Borderline  160-189  mg/dL   High  >190     mg/dL   Very High   ALT 11 06/12/2019   AST 13 (L) 06/12/2019   NA 137 07/12/2019   K 4.8 07/12/2019   CL 97 07/12/2019   CREATININE 0.93 07/12/2019    BUN 15 07/12/2019   CO2 25 07/12/2019   TSH 2.153 06/12/2019   INR 1.0 06/12/2019        Problem List: Mid esophageal squamous cell cancer Possible involvement of the carina given its location PEG tube placement History of small bowel resection exploratory laparotomy 25 pound weight loss Progressive dysphagia and odynophagia requiring reinitiation of tube feeds History of paroxysmal atrial fibrillation   Assessment / Plan:      Ms. [name] comes in today to discuss results from her PET/CT and stress test.  The stress test was low risk.  PET/CT did not show any progression of her disease.  The mass appears smaller and there is also less uptake compared to her pretherapy PET/CT.  We had a long discussion about the risks, and benefits of proceeding with robotic assisted 3-hole esophagectomy.  I explained to her that we definitely would be able to do the thoracic portion robotically, but given her history of an appendectomy and a small bowel resection in the past I am unsure as to whether or not we can do her abdominal component robotically.  Her incision is lower midline in her epigastrium is clear except for the PEG tube thus we can potentially do the conduit formation robotically if there is no too many adhesions.  I explained to her again that she does have significant risk given the fact that she has a PEG tube in her gastric conduit, and on cross-sectional imaging there is not a defined line between her airway and the mass.  I will perform a bronchoscopy prior to any incisions and if I have any concerns for tumor invasion than the procedure will be aborted.  I again stressed the high risk nature of this procedure to her and her husband and she is willing to proceed.  Her main issue right now is that she is unable to swallow due to the mass that would like anything to improve her current state.

## 2019-10-14 NOTE — Transfer of Care (Signed)
Immediate Anesthesia Transfer of Care Note  Patient: [name]  Procedure(s) Performed: XI ROBOTIC ASSISTED [place]OWN ESOPHAGECTOMY USING NIMS (N/A Chest) VIDEO BRONCHOSCOPY (N/A ) JEJUNOSTOMY PLACEMENT (N/A ) INTERCOSTAL NERVE BLOCK (Right Chest)  Patient Location: PACU  Anesthesia Type:General  Level of Consciousness: awake, alert  and oriented  Airway & Oxygen Therapy: Patient Spontanous Breathing and Patient connected to face mask oxygen  Post-op Assessment: Report given to RN and Post -op Vital signs reviewed and stable  Post vital signs: Reviewed and stable  Last Vitals:  Vitals Value Taken Time  BP 114/49 10/27/2019 1720  Temp 36.5 C 10/18/2019 1720  Pulse 99 10/13/2019 1730  Resp 12 10/20/2019 1730  SpO2 100 % 10/25/2019 1730  Vitals shown include unvalidated device data.  Last Pain:  Vitals:   10/30/2019 0639  TempSrc:   PainSc: 3       Patients Stated Pain Goal: 3 (19/16/60 6004)  Complications: No complications documented.

## 2019-10-14 NOTE — Anesthesia Procedure Notes (Signed)
Procedure Name: Intubation Date/Time: 10/17/2019 7:44 AM Performed by: Inda Coke, CRNA Pre-anesthesia Checklist: Patient identified, Emergency Drugs available, Suction available and Patient being monitored Patient Re-evaluated:Patient Re-evaluated prior to induction Oxygen Delivery Method: Circle System Utilized Preoxygenation: Pre-oxygenation with 100% oxygen Induction Type: IV induction Ventilation: Mask ventilation without difficulty Laryngoscope Size: Mac and 3 Grade View: Grade I Tube type: Oral Tube size: 8.5 mm Number of attempts: 1 Airway Equipment and Method: Stylet and Oral airway Placement Confirmation: ETT inserted through vocal cords under direct vision,  positive ETCO2 and breath sounds checked- equal and bilateral Secured at: 22 cm Tube secured with: Tape Dental Injury: Teeth and Oropharynx as per pre-operative assessment

## 2019-10-14 NOTE — Anesthesia Procedure Notes (Signed)
Arterial Line Insertion Start/End10/02/2019 7:00 AM, 10/14/2019 7:05 AM Performed by: Inda Coke, CRNA, CRNA  Preanesthetic checklist: patient identified, IV checked, site marked, risks and benefits discussed, surgical consent, monitors and equipment checked, pre-op evaluation, timeout performed and anesthesia consent Lidocaine 1% used for infiltration Left, radial was placed Catheter size: 20 G Hand hygiene performed  and maximum sterile barriers used  Allen's test indicative of satisfactory collateral circulation Attempts: 1 Procedure performed without using ultrasound guided technique. Ultrasound Notes:anatomy identified Following insertion, dressing applied and Biopatch. Patient tolerated the procedure well with no immediate complications.

## 2019-10-14 NOTE — Anesthesia Procedure Notes (Signed)
Procedure Name: Intubation Date/Time: 10/21/2019 7:58 AM Performed by: Inda Coke, CRNA Pre-anesthesia Checklist: Patient identified, Emergency Drugs available, Suction available and Patient being monitored Patient Re-evaluated:Patient Re-evaluated prior to induction Oxygen Delivery Method: Circle System Utilized Preoxygenation: Pre-oxygenation with 100% oxygen Induction Type: IV induction Ventilation: Mask ventilation without difficulty Laryngoscope Size: Mac and 3 Grade View: Grade I Tube type: Oral Endobronchial tube: Double lumen EBT, Left, EBT position confirmed by auscultation and EBT position confirmed by fiberoptic bronchoscope and 37 Fr Number of attempts: 1 Airway Equipment and Method: Stylet and Oral airway Placement Confirmation: ETT inserted through vocal cords under direct vision,  positive ETCO2 and breath sounds checked- equal and bilateral Secured at: 28 cm Tube secured with: Tape Dental Injury: Teeth and Oropharynx as per pre-operative assessment

## 2019-10-14 NOTE — Anesthesia Procedure Notes (Signed)
Procedure Name: Intubation Date/Time: 10/25/2019 11:11 AM Performed by: Inda Coke, CRNA Pre-anesthesia Checklist: Patient identified, Emergency Drugs available, Suction available and Patient being monitored Patient Re-evaluated:Patient Re-evaluated prior to induction Oxygen Delivery Method: Circle System Utilized Preoxygenation: Pre-oxygenation with 100% oxygen Induction Type: IV induction Ventilation: Mask ventilation without difficulty Laryngoscope Size: Mac and 3 Grade View: Grade I Tube type: Oral (NIMS tube) Tube size: 7.0 mm Number of attempts: 1 Airway Equipment and Method: Stylet and Oral airway Placement Confirmation: ETT inserted through vocal cords under direct vision,  positive ETCO2 and breath sounds checked- equal and bilateral Secured at: 22 cm Tube secured with: Tape Dental Injury: Teeth and Oropharynx as per pre-operative assessment

## 2019-10-15 ENCOUNTER — Inpatient Hospital Stay (HOSPITAL_COMMUNITY): Payer: Medicare Other

## 2019-10-15 LAB — CBC
HCT: 32.3 % — ABNORMAL LOW (ref 36.0–46.0)
Hemoglobin: 10.2 g/dL — ABNORMAL LOW (ref 12.0–15.0)
MCH: 28.8 pg (ref 26.0–34.0)
MCHC: 31.6 g/dL (ref 30.0–36.0)
MCV: 91.2 fL (ref 80.0–100.0)
Platelets: 134 10*3/uL — ABNORMAL LOW (ref 150–400)
RBC: 3.54 MIL/uL — ABNORMAL LOW (ref 3.87–5.11)
RDW: 13.9 % (ref 11.5–15.5)
WBC: 8.6 10*3/uL (ref 4.0–10.5)
nRBC: 0 % (ref 0.0–0.2)

## 2019-10-15 LAB — MAGNESIUM: Magnesium: 1.8 mg/dL (ref 1.7–2.4)

## 2019-10-15 LAB — BASIC METABOLIC PANEL
Anion gap: 12 (ref 5–15)
BUN: 14 mg/dL (ref 8–23)
CO2: 19 mmol/L — ABNORMAL LOW (ref 22–32)
Calcium: 8.8 mg/dL — ABNORMAL LOW (ref 8.9–10.3)
Chloride: 110 mmol/L (ref 98–111)
Creatinine, Ser: 1.44 mg/dL — ABNORMAL HIGH (ref 0.44–1.00)
GFR, Estimated: 37 mL/min — ABNORMAL LOW (ref >60)
Glucose, Bld: 171 mg/dL — ABNORMAL HIGH (ref 70–99)
Potassium: 4.2 mmol/L (ref 3.5–5.1)
Sodium: 141 mmol/L (ref 135–145)

## 2019-10-15 LAB — GLUCOSE, CAPILLARY
Glucose-Capillary: 118 mg/dL — ABNORMAL HIGH (ref 70–99)
Glucose-Capillary: 124 mg/dL — ABNORMAL HIGH (ref 70–99)
Glucose-Capillary: 130 mg/dL — ABNORMAL HIGH (ref 70–99)
Glucose-Capillary: 140 mg/dL — ABNORMAL HIGH (ref 70–99)
Glucose-Capillary: 154 mg/dL — ABNORMAL HIGH (ref 70–99)
Glucose-Capillary: 155 mg/dL — ABNORMAL HIGH (ref 70–99)

## 2019-10-15 LAB — PHOSPHORUS: Phosphorus: 2.5 mg/dL (ref 2.5–4.6)

## 2019-10-15 MED ORDER — OSMOLITE 1.5 CAL PO LIQD
1000.0000 mL | ORAL | Status: DC
  Administered 2019-10-16 – 2019-10-19 (×3): 1000 mL
  Filled 2019-10-15: qty 1000

## 2019-10-15 MED ORDER — DIPHENHYDRAMINE HCL 50 MG/ML IJ SOLN: 12.5000 mg | Freq: Four times a day (QID) | INTRAMUSCULAR | Status: DC | PRN

## 2019-10-15 MED ORDER — CHLORHEXIDINE GLUCONATE CLOTH 2 % EX PADS
6.0000 | MEDICATED_PAD | Freq: Every day | CUTANEOUS | Status: DC
  Administered 2019-10-15 – 2019-11-13 (×22): 6 via TOPICAL

## 2019-10-15 MED ORDER — NALOXONE HCL 0.4 MG/ML IJ SOLN: 0.4000 mg | INTRAMUSCULAR | Status: DC | PRN

## 2019-10-15 MED ORDER — ONDANSETRON HCL 4 MG/2ML IJ SOLN: 4.0000 mg | Freq: Four times a day (QID) | INTRAMUSCULAR | Status: DC | PRN

## 2019-10-15 MED ORDER — PROSOURCE TF PO LIQD: 45.0000 mL | Freq: Two times a day (BID) | ORAL | Status: DC

## 2019-10-15 MED ORDER — PROSOURCE TF PO LIQD
45.0000 mL | Freq: Three times a day (TID) | ORAL | Status: DC
  Administered 2019-10-16 – 2019-10-21 (×16): 45 mL
  Filled 2019-10-15 (×16): qty 45

## 2019-10-15 MED ORDER — FUROSEMIDE 10 MG/ML IJ SOLN
20.0000 mg | Freq: Four times a day (QID) | INTRAMUSCULAR | Status: AC
  Administered 2019-10-15 (×3): 20 mg via INTRAVENOUS
  Filled 2019-10-15 (×4): qty 2

## 2019-10-15 MED ORDER — DIPHENHYDRAMINE HCL 12.5 MG/5ML PO ELIX: 12.5000 mg | ORAL_SOLUTION | Freq: Four times a day (QID) | ORAL | Status: DC | PRN

## 2019-10-15 MED ORDER — MORPHINE SULFATE 2 MG/ML IV SOLN
INTRAVENOUS | Status: DC
  Administered 2019-10-15: 10.5 mg via INTRAVENOUS
  Administered 2019-10-15: 1.5 mg via INTRAVENOUS
  Administered 2019-10-16: 10.5 mg via INTRAVENOUS
  Filled 2019-10-15: qty 25
  Filled 2019-10-15: qty 50

## 2019-10-15 MED ORDER — SODIUM CHLORIDE 0.9% FLUSH: 9.0000 mL | INTRAVENOUS | Status: DC | PRN

## 2019-10-15 NOTE — Progress Notes (Addendum)
      [address]       [place] [address]             [phone]      1 Day Post-Op Procedure(s) (LRB): XI ROBOTIC ASSISTED [place] ESOPHAGECTOMY USING NIMS (N/A) VIDEO BRONCHOSCOPY (N/A) JEJUNOSTOMY PLACEMENT (N/A) INTERCOSTAL NERVE BLOCK (Right) Subjective: Awake and alert, having some discomfort at chest tube insertion site right posterior chest.  Otherwise says pain control is adequate.    On RA. O2 sat 95%.  Objective: Vital signs in last 24 hours: Temp:  [97.7 F (36.5 C)-98.4 F (36.9 C)] 98.2 F (36.8 C) (10/09 0803) Pulse Rate:  [99-105] 99 (10/09 0803) Cardiac Rhythm: Sinus tachycardia;Bundle branch block (10/09 0700) Resp:  [12-18] 18 (10/09 0803) BP: (97-127)/(48-68) 117/58 (10/09 0803) SpO2:  [94 %-99 %] 95 % (10/09 0803)    Intake/Output from previous day: 10/08 0701 - 10/09 0700 In: 6134.7 [I.V.:4154.7; IV Piggyback:1750] Out: 4888 [Urine:1035; Emesis/NG output:230; Blood:75; Chest Tube:230] Intake/Output this shift: Total I/O In: -  Out: 20 [Chest Tube:20]  General appearance: alert, cooperative and mild distress Neurologic: intact Heart: RRR, SR / ST.  Lungs: Breath sounds are clear but shallow. Thin, serous right CT drainage. CXR shows mild right subQ air that is stable.  Abdomen: soft, expected mild peri-incisional tenderness. The J-tube is secure.  Extremities: Warm and well perfused.  LE SCDs in place. Has significant peripheral edema.  Wound:the left neck dressing is dry and the port incisions are dry. The midline abdominal incision is intact and dry.   Lab Results: Recent Labs    11/06/2019 2146 10/15/19 0059  WBC 7.5 8.6  HGB 10.3* 10.2*  HCT 31.9* 32.3*  PLT 130* 134*   BMET:  Recent Labs    11/04/2019 2146 10/15/19 0059  NA 137 141  K 3.8 4.2  CL 106 110  CO2 17* 19*  GLUCOSE 176* 171*  BUN 14 14  CREATININE 1.32* 1.44*  CALCIUM 8.5* 8.8*    PT/INR: No results for input(s): LABPROT, INR in the last 72  hours. ABG    Component Value Date/Time   PHART 7.387 10/13/2019 2050   HCO3 16.4 (L) 10/11/2019 2050   TCO2 21 (L) 10/26/2019 1515   ACIDBASEDEF 7.8 (H) 10/13/2019 2050   O2SAT 96.2 10/18/2019 2050   CBG (last 3)  Recent Labs    10/22/2019 2309 10/15/19 0329 10/15/19 0812  GLUCAP 160* 140* 124*    Assessment/Plan: S/P Procedure(s) (LRB): XI ROBOTIC ASSISTED [place] ESOPHAGECTOMY USING NIMS (N/A) VIDEO BRONCHOSCOPY (N/A) JEJUNOSTOMY PLACEMENT (N/A) INTERCOSTAL NERVE BLOCK (Right)  -POD1 3-field esophagectomy and placement of a feeding jejunostomy. copntinue NPO. Plan "tubogram" of J-tube this AM to confirm placement and if appropriate will begin nutrition support with TF.  Mobilize. Continue current pain mgt with Toradol, IV tylenol, and prn morphine.   -Type 2 DM- continue to manage with SSI for now.   -Volume excess-I&O net 4.5L+.  Will diurese today.   -History of HTN- BP acceptable, monitor.  -DVT PPX- continue SCD's   LOS: 1 day    Antony Odea,  PA-C 10/15/2019 Pt seen and examined; tearful in pain. Suggest PCA Anzel Kearse Z. Orvan Seen, [place]

## 2019-10-15 NOTE — Plan of Care (Signed)
°  Problem: Education: Goal: Knowledge of General Education information will improve Description: Including pain rating scale, medication(s)/side effects and non-pharmacologic comfort measures Outcome: Progressing   Problem: Health Behavior/Discharge Planning: Goal: Ability to manage health-related needs will improve Outcome: Progressing   Problem: Clinical Measurements: Goal: Ability to maintain clinical measurements within normal limits will improve Outcome: Progressing   Problem: Clinical Measurements: Goal: Diagnostic test results will improve Outcome: Progressing   Problem: Coping: Goal: Level of anxiety will decrease Outcome: Progressing   Problem: Pain Managment: Goal: General experience of comfort will improve Outcome: Progressing   Problem: Skin Integrity: Goal: Risk for impaired skin integrity will decrease Outcome: Progressing

## 2019-10-15 NOTE — Progress Notes (Signed)
Initial Nutrition Assessment  DOCUMENTATION CODES:   Not applicable  INTERVENTION:   Tube feeding:  -Osmolite.5 @ 20 ml/hr via J tube -Increase by 10 ml Q4 hours to goal rate of 55 ml/hr (1320 ml) -45 ml ProSource TID  Provides: 2100 kcals, 116 grams protein, 1006 ml free water.   Monitor magnesium, potassium, and phosphorus daily for at least 3 days, MD to replete as needed, as pt is at risk for refeeding syndrome.   May transition to nocturnal feedings upon d/c  NUTRITION DIAGNOSIS:   Increased nutrient needs related to post-op healing as evidenced by estimated needs.  GOAL:   Patient will meet greater than or equal to 90% of their needs  MONITOR:   Diet advancement, Skin, TF tolerance, Weight trends, Labs, I & O's  REASON FOR ASSESSMENT:   Consult Enteral/tube feeding initiation and management  ASSESSMENT:   Patient with PMH significant for COPD, diverticulosis, GERD, SBO, HTN, IBS, mixed HLD, and esophageal squamous cell cancer s/p neoadjuvant chemoradiation therapy s/p PEG. Presents this admission for surgical management of esophageal cancer.   10/9- s/p esophagectomy, J-tube  Pt reports not being able to take PO since July due to burning in abdomen and esophagus. States she has been providing her self with bolus feedings during this time period. States she uses four cartons of unknown formula each day with 1 bottle of water at each feeding (times- 8 am, 12 pm, 5p m, 8 pm). Discussed feeding tube is now in small intestine and should would need to use pump for feedings. May consider transition to nocturnal feedings upon d/c.   Pt endorsed UBW of 181 lb and now has lost a reported 24 lb since June. Records indicate pt weighed 186 lb on 1/18 and 161 lb this admission (13.4% wt loss in 10 months, insignificant for time frame). Suspect malnutrition but unable to diagnose without NFPE.   Drips: D5 in NS @ 100 ml/hr Medications: SS novolog Labs: CBG 118-160  Diet  Order:   Diet Order            Diet NPO time specified  Diet effective now                 EDUCATION NEEDS:   Education needs have been addressed  Skin:  Skin Assessment: Skin Integrity Issues: Skin Integrity Issues:: Incisions Incisions: R chest, abdomen x2  Last BM:  10/8  Height:   Ht Readings from Last 1 Encounters:  11/05/2019 5\' 2"  (1.575 m)    Weight:   Wt Readings from Last 1 Encounters:  11/03/2019 73 kg    BMI:  Body mass index is 29.45 kg/m.  Estimated Nutritional Needs:   Kcal:  1950-2150 kcal  Protein:  100-120 gram  Fluid:  >/= 1.9 L/day   Mariana Single RD, LDN Clinical Nutrition Pager listed in [facility]

## 2019-10-15 NOTE — Anesthesia Postprocedure Evaluation (Signed)
Anesthesia Post Note  Patient: [name]  Procedure(s) Performed: XI ROBOTIC ASSISTED [place] ESOPHAGECTOMY USING NIMS (N/A Chest) VIDEO BRONCHOSCOPY (N/A ) JEJUNOSTOMY PLACEMENT (N/A ) INTERCOSTAL NERVE BLOCK (Right Chest)     Patient location during evaluation: PACU Anesthesia Type: General Level of consciousness: awake and alert Pain management: pain level controlled Vital Signs Assessment: post-procedure vital signs reviewed and stable Respiratory status: spontaneous breathing, nonlabored ventilation, respiratory function stable and patient connected to nasal cannula oxygen Cardiovascular status: blood pressure returned to baseline and stable Postop Assessment: no apparent nausea or vomiting Anesthetic complications: no   No complications documented.  Last Vitals:  Vitals:   10/15/19 0328 10/15/19 0803  BP: (!) 120/57 (!) 117/58  Pulse: (!) 105 99  Resp: 18 18  Temp: 36.8 C 36.8 C  SpO2: 94% 95%    Last Pain:  Vitals:   10/15/19 0803  TempSrc: Oral  PainSc:                  Audry Pili

## 2019-10-16 ENCOUNTER — Encounter (HOSPITAL_COMMUNITY): Payer: Self-pay | Admitting: Thoracic Surgery (Cardiothoracic Vascular Surgery)

## 2019-10-16 ENCOUNTER — Inpatient Hospital Stay (HOSPITAL_COMMUNITY): Payer: Medicare Other

## 2019-10-16 LAB — MAGNESIUM
Magnesium: 1.6 mg/dL — ABNORMAL LOW (ref 1.7–2.4)
Magnesium: 1.9 mg/dL (ref 1.7–2.4)

## 2019-10-16 LAB — BASIC METABOLIC PANEL
Anion gap: 8 (ref 5–15)
BUN: 13 mg/dL (ref 8–23)
CO2: 26 mmol/L (ref 22–32)
Calcium: 8.3 mg/dL — ABNORMAL LOW (ref 8.9–10.3)
Chloride: 107 mmol/L (ref 98–111)
Creatinine, Ser: 0.94 mg/dL (ref 0.44–1.00)
GFR, Estimated: >60 mL/min (ref >60)
Glucose, Bld: 139 mg/dL — ABNORMAL HIGH (ref 70–99)
Potassium: 3.3 mmol/L — ABNORMAL LOW (ref 3.5–5.1)
Sodium: 141 mmol/L (ref 135–145)

## 2019-10-16 LAB — CBC
HCT: 32.7 % — ABNORMAL LOW (ref 36.0–46.0)
Hemoglobin: 10.1 g/dL — ABNORMAL LOW (ref 12.0–15.0)
MCH: 28.6 pg (ref 26.0–34.0)
MCHC: 30.9 g/dL (ref 30.0–36.0)
MCV: 92.6 fL (ref 80.0–100.0)
Platelets: 136 10*3/uL — ABNORMAL LOW (ref 150–400)
RBC: 3.53 MIL/uL — ABNORMAL LOW (ref 3.87–5.11)
RDW: 14.5 % (ref 11.5–15.5)
WBC: 11.7 10*3/uL — ABNORMAL HIGH (ref 4.0–10.5)
nRBC: 0 % (ref 0.0–0.2)

## 2019-10-16 LAB — GLUCOSE, CAPILLARY
Glucose-Capillary: 131 mg/dL — ABNORMAL HIGH (ref 70–99)
Glucose-Capillary: 130 mg/dL — ABNORMAL HIGH (ref 70–99)
Glucose-Capillary: 127 mg/dL — ABNORMAL HIGH (ref 70–99)
Glucose-Capillary: 165 mg/dL — ABNORMAL HIGH (ref 70–99)
Glucose-Capillary: 157 mg/dL — ABNORMAL HIGH (ref 70–99)

## 2019-10-16 LAB — PHOSPHORUS
Phosphorus: 2.9 mg/dL (ref 2.5–4.6)
Phosphorus: 2.5 mg/dL (ref 2.5–4.6)

## 2019-10-16 MED ORDER — POTASSIUM CHLORIDE 20 MEQ/15ML (10%) PO SOLN
40.0000 meq | Freq: Two times a day (BID) | ORAL | Status: DC
  Administered 2019-10-16: 40 meq via ORAL
  Filled 2019-10-16: qty 30

## 2019-10-16 MED ORDER — MAGNESIUM SULFATE 2 GM/50ML IV SOLN
2.0000 g | Freq: Once | INTRAVENOUS | Status: AC
  Administered 2019-10-16: 2 g via INTRAVENOUS
  Filled 2019-10-16: qty 50

## 2019-10-16 MED ORDER — POTASSIUM CHLORIDE 20 MEQ/15ML (10%) PO SOLN
40.0000 meq | Freq: Two times a day (BID) | ORAL | Status: AC
  Administered 2019-10-16: 40 meq
  Filled 2019-10-16: qty 30

## 2019-10-16 MED ORDER — FUROSEMIDE 10 MG/ML IJ SOLN
40.0000 mg | Freq: Once | INTRAMUSCULAR | Status: AC
  Administered 2019-10-16: 40 mg via INTRAVENOUS
  Filled 2019-10-16: qty 4

## 2019-10-16 MED ORDER — DIPHENHYDRAMINE HCL 50 MG/ML IJ SOLN: 12.5000 mg | Freq: Four times a day (QID) | INTRAMUSCULAR | Status: DC | PRN

## 2019-10-16 MED ORDER — METOPROLOL TARTRATE 25 MG/10 ML ORAL SUSPENSION
12.5000 mg | Freq: Two times a day (BID) | ORAL | Status: DC
  Administered 2019-10-16 (×2): 12.5 mg
  Filled 2019-10-16 (×2): qty 5

## 2019-10-16 MED ORDER — ALBUMIN HUMAN 5 % IV SOLN
12.5000 g | Freq: Once | INTRAVENOUS | Status: AC
  Administered 2019-10-16: 12.5 g via INTRAVENOUS
  Filled 2019-10-16: qty 250

## 2019-10-16 MED ORDER — DIPHENHYDRAMINE HCL 12.5 MG/5ML PO ELIX: 12.5000 mg | ORAL_SOLUTION | Freq: Four times a day (QID) | ORAL | Status: DC | PRN

## 2019-10-16 NOTE — Progress Notes (Signed)
   10/16/19 0556  Assess: MEWS Score  Temp 98.3 F (36.8 C)  BP (!) 124/59  Pulse Rate (!) 128  ECG Heart Rate (!) 128  Resp 18  Level of Consciousness Alert  SpO2 91 %  O2 Device Nasal Cannula  Patient Activity (if Appropriate) In bed  O2 Flow Rate (L/min) 6 L/min  Assess: MEWS Score  MEWS Temp 0  MEWS Systolic 0  MEWS Pulse 2  MEWS RR 0  MEWS LOC 0  MEWS Score 2  MEWS Score Color Yellow  Treat  Pain Scale 0-10  Pain Score 8  Pain Type Surgical pain  Pain Location Generalized  Pain Descriptors / Indicators Discomfort  Pain Onset On-going  Patients Stated Pain Goal 0  Pain Intervention(s) PCA encouraged  Notify: Provider  Provider Name/Title Dr. Orvan Seen  Date Provider Notified 10/16/19  Time Provider Notified [phone]  Notification Type Call  Notification Reason Change in status  Response No new orders  Date of Provider Response 10/16/19  Time of Provider Response [id number]  Document  Progress note created (see row info) Yes   Dr. Orvan Seen paged regarding pt's increasing HR. Maintaining HR in the low to mid 120s. All other VSS. No new orders given. Will continue to monitor the patient.

## 2019-10-16 NOTE — Progress Notes (Signed)
      [address]       [place] [address]             [phone]      12:10pm CTSP for acute O2 desaturation top 70"s.  Awake and responsive when I saw her. NRB had just been placed by the rapid response RN and O2 sat was 92%. She said she was breathing easier.  Heart-S.Tach at 106 Chest- good air movement bilat, fine basilar crackles. CXR pending.   I/P- hypoxia from worsening aeration due to ATX, volume overload, and over-sedation from PCA.  Will d/c the PCA and IVF. Lasix IV x 1 dose now. Re-check CXR to assure no acute changes since this morning. Mobilize. Monitor.

## 2019-10-16 NOTE — Progress Notes (Addendum)
[address]       [place] [address]             [phone]      2 Days Post-Op Procedure(s) (LRB): XI ROBOTIC ASSISTED [place]KEOWN ESOPHAGECTOMY USING NIMS (N/A) VIDEO BRONCHOSCOPY (N/A) JEJUNOSTOMY PLACEMENT (N/A) INTERCOSTAL NERVE BLOCK (Right) Subjective: Awake and alert, pain control better with PCA.  C/O sore throat from NG tube.   Had some shortness of breath and low sats this morning, now on 6Lnc.      Objective: Vital signs in last 24 hours: Temp:  [97.6 F (36.4 C)-100.4 F (38 C)] 100.4 F (38 C) (10/10 0800) Pulse Rate:  [97-128] 113 (10/10 0800) Cardiac Rhythm: Sinus tachycardia;Bundle branch block (10/10 0700) Resp:  [15-31] 20 (10/10 0912) BP: (98-127)/(45-59) 98/45 (10/10 0800) SpO2:  [91 %-97 %] 92 % (10/10 0912) Weight:  [79 kg] 79 kg (10/10 0327)    Intake/Output from previous day: 10/09 0701 - 10/10 0700 In: 2521.9 [I.V.:2321.9; IV Piggyback:200] Out: 4110 [Urine:3250; Emesis/NG output:470; Chest Tube:390] Intake/Output this shift: No intake/output data recorded.  General appearance: alert, cooperative and mild distress Neurologic: intact Heart: sinus tach Lungs: Breath sounds are clear but shallow. Thin, serous right CT drainage. CXR shows decrease right subQ air, left basilar ATX.  Abdomen: soft, expected mild peri-incisional tenderness. The J-tube is secure.  Extremities: Warm and well perfused.  LE SCDs in place. Has significant peripheral edema.  Wound:the left neck dressing is dry and the port incisions are dry. The midline abdominal incision is intact and dry.   Lab Results: Recent Labs    10/15/19 0059 10/16/19 0714  WBC 8.6 11.7*  HGB 10.2* 10.1*  HCT 32.3* 32.7*  PLT 134* 136*   BMET:  Recent Labs    10/15/19 0059 10/16/19 0714  NA 141 141  K 4.2 3.3*  CL 110 107  CO2 19* 26  GLUCOSE 171* 139*  BUN 14 13  CREATININE 1.44* 0.94  CALCIUM 8.8* 8.3*    PT/INR: No results for input(s): LABPROT, INR  in the last 72 hours. ABG    Component Value Date/Time   PHART 7.387 10/24/2019 2050   HCO3 16.4 (L) 11/02/2019 2050   TCO2 21 (L) 10/11/2019 1515   ACIDBASEDEF 7.8 (H) 10/20/2019 2050   O2SAT 96.2 11/04/2019 2050   CBG (last 3)  Recent Labs    10/15/19 2345 10/16/19 0331 10/16/19 0812  GLUCAP 155* 131* 130*   EXAM: ABDOMEN - 1 VIEW  COMPARISON:  None.  FINDINGS: Contrast has been injected via a jejunostomy catheter demonstrating opacification of several loops of small bowel.  Paucity of bowel gas without evidence of enteric obstruction. Enteric tube tip projects over the left upper abdominal quadrant.  No acute osseous abnormalities.  IMPRESSION: Contrast injection of feeding jejunostomy catheter opacifies several loops of small bowel suggesting appropriate positioning and functionality.   Electronically Signed   By: Sandi Mariscal M.D.   On: 10/15/2019 13:49    Assessment/Plan: S/P Procedure(s) (LRB): XI ROBOTIC ASSISTED MCKEOWN ESOPHAGECTOMY USING NIMS (N/A) VIDEO BRONCHOSCOPY (N/A) JEJUNOSTOMY PLACEMENT (N/A) INTERCOSTAL NERVE BLOCK (Right)  -POD2 3-field esophagectomy and placement of a feeding jejunostomy. continue NPO. Confirmed placement of the J-tube with a contrast study yesterday.  Will begin nutrition support with TF today per dietician recs.  Mobilize and work on Research scientist (medical). Continue current pain mgt with PCA for now.   -Type 2 DM- continue to manage with SSI.  -Volume excess-good response to  diuresis yesterday. No further Lasix  -Hypokalemia- replacement ordered.   -S.Tachycardia-will resume her usual beta blocker at reduced dose.   -History of HTN- BP acceptable, monitor.  -DVT PPX- continue SCD's   LOS: 2 days    Antony Odea,  PA-C [phone] 10/16/2019 Pt seen and examined; agree with documentation. She is losing a lot of volume through NGT and UOP. Gentle replacement today; PT/OT consults.  Keep NGT in  place. Mont[employer] Police Z. Orvan Seen, [employer]

## 2019-10-16 NOTE — Progress Notes (Addendum)
Patient complaining of shortness of breath upon repositioning. O2 sats maintaining in high 80s on 3L [place], RR 31. O2 increased to 6L and patient's O2 sats increased to 92%. RT called to give patient PRN albuterol. Will continue to monitor the patient.

## 2019-10-16 NOTE — Progress Notes (Signed)
Morphine PCA syringe replaced. 41mL wasted in stericycle. Witnessed by [employer].

## 2019-10-16 NOTE — Plan of Care (Signed)
  Problem: Education: Goal: Knowledge of General Education information will improve Description: Including pain rating scale, medication(s)/side effects and non-pharmacologic comfort measures Outcome: Progressing   Problem: Health Behavior/Discharge Planning: Goal: Ability to manage health-related needs will improve Outcome: Progressing   Problem: Clinical Measurements: Goal: Ability to maintain clinical measurements within normal limits will improve Outcome: Progressing   Problem: Clinical Measurements: Goal: Respiratory complications will improve Outcome: Progressing   Problem: Nutrition: Goal: Adequate nutrition will be maintained Outcome: Progressing   Problem: Coping: Goal: Level of anxiety will decrease Outcome: Progressing   Problem: Pain Managment: Goal: General experience of comfort will improve Outcome: Progressing

## 2019-10-17 ENCOUNTER — Inpatient Hospital Stay (HOSPITAL_COMMUNITY): Payer: Medicare Other

## 2019-10-17 LAB — CBC
HCT: 35.6 % — ABNORMAL LOW (ref 36.0–46.0)
Hemoglobin: 11.1 g/dL — ABNORMAL LOW (ref 12.0–15.0)
MCH: 28.5 pg (ref 26.0–34.0)
MCHC: 31.2 g/dL (ref 30.0–36.0)
MCV: 91.5 fL (ref 80.0–100.0)
Platelets: 138 10*3/uL — ABNORMAL LOW (ref 150–400)
RBC: 3.89 MIL/uL (ref 3.87–5.11)
RDW: 14.2 % (ref 11.5–15.5)
WBC: 10.1 10*3/uL (ref 4.0–10.5)
nRBC: 0 % (ref 0.0–0.2)

## 2019-10-17 LAB — BASIC METABOLIC PANEL
Anion gap: 11 (ref 5–15)
BUN: 20 mg/dL (ref 8–23)
CO2: 25 mmol/L (ref 22–32)
Calcium: 8.9 mg/dL (ref 8.9–10.3)
Chloride: 109 mmol/L (ref 98–111)
Creatinine, Ser: 0.93 mg/dL (ref 0.44–1.00)
GFR, Estimated: >60 mL/min (ref >60)
Glucose, Bld: 140 mg/dL — ABNORMAL HIGH (ref 70–99)
Potassium: 3.4 mmol/L — ABNORMAL LOW (ref 3.5–5.1)
Sodium: 145 mmol/L (ref 135–145)

## 2019-10-17 LAB — PHOSPHORUS: Phosphorus: 1.9 mg/dL — ABNORMAL LOW (ref 2.5–4.6)

## 2019-10-17 LAB — MAGNESIUM: Magnesium: 1.8 mg/dL (ref 1.7–2.4)

## 2019-10-17 LAB — GLUCOSE, CAPILLARY
Glucose-Capillary: 109 mg/dL — ABNORMAL HIGH (ref 70–99)
Glucose-Capillary: 127 mg/dL — ABNORMAL HIGH (ref 70–99)
Glucose-Capillary: 129 mg/dL — ABNORMAL HIGH (ref 70–99)
Glucose-Capillary: 130 mg/dL — ABNORMAL HIGH (ref 70–99)
Glucose-Capillary: 152 mg/dL — ABNORMAL HIGH (ref 70–99)
Glucose-Capillary: 163 mg/dL — ABNORMAL HIGH (ref 70–99)

## 2019-10-17 MED ORDER — METOPROLOL TARTRATE 25 MG/10 ML ORAL SUSPENSION
25.0000 mg | Freq: Two times a day (BID) | ORAL | Status: DC
  Administered 2019-10-17 (×2): 25 mg
  Filled 2019-10-17 (×3): qty 10

## 2019-10-17 MED ORDER — LOPERAMIDE HCL 1 MG/7.5ML PO SUSP
2.0000 mg | ORAL | Status: AC | PRN
  Administered 2019-10-17 – 2019-10-19 (×4): 2 mg
  Filled 2019-10-17 (×6): qty 15

## 2019-10-17 MED ORDER — POTASSIUM CHLORIDE 20 MEQ/15ML (10%) PO SOLN
40.0000 meq | Freq: Two times a day (BID) | ORAL | Status: AC
  Administered 2019-10-17: 40 meq
  Filled 2019-10-17: qty 30

## 2019-10-17 MED ORDER — GERHARDT'S BUTT CREAM
TOPICAL_CREAM | Freq: Three times a day (TID) | CUTANEOUS | Status: AC
  Administered 2019-10-17 – 2019-10-22 (×6): 1 via TOPICAL
  Filled 2019-10-17: qty 1

## 2019-10-17 MED ORDER — GUAIFENESIN ER 600 MG PO TB12
600.0000 mg | ORAL_TABLET | Freq: Two times a day (BID) | ORAL | Status: DC
  Filled 2019-10-17: qty 1

## 2019-10-17 MED ORDER — GUAIFENESIN 100 MG/5ML PO SOLN
5.0000 mL | ORAL | Status: DC
  Administered 2019-10-17 – 2019-10-24 (×43): 100 mg
  Filled 2019-10-17 (×44): qty 5

## 2019-10-17 NOTE — Progress Notes (Addendum)
[address]       [place], [address]             [phone]      3 Days Post-Op Procedure(s) (LRB): XI ROBOTIC ASSISTED MCKEOWN ESOPHAGECTOMY USING NIMS (N/A) VIDEO BRONCHOSCOPY (N/A) JEJUNOSTOMY PLACEMENT (N/A) INTERCOSTAL NERVE BLOCK (Right) Subjective: Awake and alert, says she is breathing much easier today. Pain control is "so so" but she is managing without the PCA.  Says she is having a lot of nasal and sinus congestion related to the NG.   TF infusing at goal of 79ml/hr.  No abdominal pain, several BM's since it was initiated.    Objective: Vital signs in last 24 hours: Temp:  [97.7 F (36.5 C)-101.2 F (38.4 C)] 99 F (37.2 C) (10/11 0418) Pulse Rate:  [102-120] 104 (10/11 0418) Cardiac Rhythm: Sinus tachycardia;Bundle branch block (10/11 0709) Resp:  [15-24] 20 (10/11 0418) BP: (98-152)/(45-77) 152/69 (10/11 0418) SpO2:  [89 %-100 %] 97 % (10/11 0418) Weight:  [78.3 kg] 78.3 kg (10/11 0434)    Intake/Output from previous day: 10/10 0701 - 10/11 0700 In: 2030.3 [I.V.:777.2; NG/GT:956.1; IV Piggyback:297] Out: 9628 [Urine:450; Emesis/NG output:550; Chest Tube:220] Intake/Output this shift: No intake/output data recorded.  General appearance: alert, cooperative and mild distress Neurologic: intact Heart: RRR, SR / ST.  Lungs: Breath sounds are clear but shallow. Thin, serous CT drainage, 252ml past 24 hours. CXR shows mild right subQ air that is stable and stable bibasilar ATX / air space disease.  Abdomen: soft, expected mild peri-incisional tenderness. The J-tube is secure.  Extremities: Warm and well perfused.  LE SCDs in place. Has less peripheral edema.  Wound:the left neck dressing is dry and the port incisions are dry. The midline abdominal incision is intact and dry.   Lab Results: Recent Labs    10/15/19 0059 10/16/19 0714  WBC 8.6 11.7*  HGB 10.2* 10.1*  HCT 32.3* 32.7*  PLT 134* 136*   BMET:  Recent Labs     10/15/19 0059 10/16/19 0714  NA 141 141  K 4.2 3.3*  CL 110 107  CO2 19* 26  GLUCOSE 171* 139*  BUN 14 13  CREATININE 1.44* 0.94  CALCIUM 8.8* 8.3*    PT/INR: No results for input(s): LABPROT, INR in the last 72 hours. ABG    Component Value Date/Time   PHART 7.387 10/18/2019 2050   HCO3 16.4 (L) 10/19/2019 2050   TCO2 21 (L) 11/05/2019 1515   ACIDBASEDEF 7.8 (H) 10/16/2019 2050   O2SAT 96.2 10/17/2019 2050   CBG (last 3)  Recent Labs    10/16/19 1955 10/16/19 2357 10/17/19 0431  GLUCAP 157* 129* 152*    Assessment/Plan: S/P Procedure(s) (LRB): XI ROBOTIC ASSISTED MCKEOWN ESOPHAGECTOMY USING NIMS (N/A) VIDEO BRONCHOSCOPY (N/A) JEJUNOSTOMY PLACEMENT (N/A) INTERCOSTAL NERVE BLOCK (Right)  -POD-3  3-field esophagectomy and placement of a feeding jejunostomy. Continue NPO.  Mobilize. Continue current pain mgt with Toradol, IV tylenol, and prn morphine. Continue nutrition support with J-tube feeding. Plan for swallow study tomorrow. BMP is pending.  -Hypertension- BP trending up and she also remains tachycardic. Will increase metoprolol susp to 25mg  per tube BID.  -Type 2 DM- continue to manage with SSI for now.   -Volume excess-Wt still ~5kg above pre-op    -History of HTN- BP acceptable, monitor.  -DVT PPX- continue SCD's  -Disposition planning- would benefit from inpatient rehab. Anticipate she will be ready by the end of the week. Will request  CIR consult and PT/OT eval.    LOS: 3 days    Antony Odea,  PA-C [phone] 10/17/2019  Agree with above, Doing better today. We will obtain swallow study tomorrow potentially remove the NG tube. Continue tube feeds. Continue ambulation and pulmonary toilet.  Nataliya Graig Bary Leriche

## 2019-10-17 NOTE — Evaluation (Addendum)
Occupational Therapy Evaluation Patient Details Name: [name] MRN: [id number] DOB: 1950/08/11 Today's Date: 10/17/2019    History of Present Illness 69 yo with history of esophageal CA admitted for esophagectomy with jejunostomy placement. Additional PMHx: CoPD, Asthma, HTN, HLD, hypothyroidism, CAD   Clinical Impression   Pt was independent in self care and cooked for her significant other prior to admission. He assisted with housekeeping. Pt presents with pain at chest tube site, generalized weakness and impaired standing balance. Pt requires set up to min assist for ADL. Pt is fatigued from sleeping poorly due to diarrhea last night extending into today.  She has support of her boyfriend, daughter and granddaughter (who are CNAs) when she returns home. Anticipate pt will be able to discharge home when medically ready.     Follow Up Recommendations  Home health OT;Supervision/Assistance - 24 hour    Equipment Recommendations  3 in 1 bedside commode    Recommendations for Other Services       Precautions / Restrictions Precautions Precautions: Fall;Other (comment) Precaution Comments: NGT, chest tube      Mobility Bed Mobility Overal bed mobility: Needs Assistance Bed Mobility: Sit to Supine       Sit to supine: Supervision   General bed mobility comments: no physical assist, line management only  Transfers Overall transfer level: Needs assistance Equipment used: Rolling walker (2 wheeled) Transfers: Sit to/from Stand Sit to Stand: Min guard         General transfer comment: cues for hand placement, slow to rise    Balance Overall balance assessment: Needs assistance   Sitting balance-Leahy Scale: Good       Standing balance-Leahy Scale: Poor Standing balance comment: reliant on RW                           ADL either performed or assessed with clinical judgement   ADL Overall ADL's : Needs assistance/impaired Eating/Feeding:  NPO Eating/Feeding Details (indicate cue type and reason): able to swab her mouth with ice water Grooming: Set up;Sitting   Upper Body Bathing: Minimal assistance;Sitting   Lower Body Bathing: Minimal assistance;Sit to/from stand   Upper Body Dressing : Minimal assistance;Sitting   Lower Body Dressing: Minimal assistance;Sit to/from stand   Toilet Transfer: Min guard;Ambulation;BSC;RW   Toileting- Clothing Manipulation and Hygiene: Maximal assistance;Sit to/from stand       Functional mobility during ADLs: Min guard;Rolling walker       Vision Patient Visual Report: No change from baseline       Perception     Praxis      Pertinent Vitals/Pain Pain Assessment: Faces Faces Pain Scale: Hurts little more Pain Location: chest tube site Pain Descriptors / Indicators: Guarding;Grimacing;Discomfort Pain Intervention(s): Monitored during session;Repositioned     Hand Dominance Right   Extremity/Trunk Assessment Upper Extremity Assessment Upper Extremity Assessment: Overall WFL for tasks assessed   Lower Extremity Assessment Lower Extremity Assessment: Defer to PT evaluation   Cervical / Trunk Assessment Cervical / Trunk Assessment: Kyphotic   Communication Communication Communication: No difficulties   Cognition Arousal/Alertness: Awake/alert Behavior During Therapy: Flat affect Overall Cognitive Status: Within Functional Limits for tasks assessed                                     General Comments       Exercises     Shoulder Instructions  Home Living Family/patient expects to be discharged to:: Private residence Living Arrangements: Spouse/significant other Available Help at Discharge: Friend(s);Available 24 hours/day;Family Type of Home: mobile home Home Access: Stairs to enter Entrance Stairs-Number of Steps: 4 Entrance Stairs-Rails: Can reach both Home Layout: One level     Bathroom Shower/Tub: Radiographer, therapeutic: Standard     Home Equipment: Environmental consultant - 2 wheels;Cane - single point;Grab bars - toilet;Grab bars - tub/shower;Shower seat - built in, quad cane          Prior Functioning/Environment Level of Independence: Needs assistance    ADL's / Homemaking Assistance Needed: independent in ADLs, cooks, boyfriend assists with iADLs            OT Problem List: Decreased strength;Decreased activity tolerance;Impaired balance (sitting and/or standing);Pain      OT Treatment/Interventions: Self-care/ADL training;DME and/or AE instruction;Therapeutic activities;Patient/family education;Balance training    OT Goals(Current goals can be found in the care plan section) Acute Rehab OT Goals Patient Stated Goal: return home OT Goal Formulation: With patient Time For Goal Achievement: 10/31/19 Potential to Achieve Goals: Good ADL Goals Pt Will Perform Grooming: with supervision;standing Pt Will Perform Upper Body Bathing: with set-up;sitting Pt Will Perform Lower Body Bathing: with supervision;sit to/from stand Pt Will Perform Upper Body Dressing: with set-up;sitting Pt Will Perform Lower Body Dressing: with supervision;sit to/from stand Pt Will Transfer to Toilet: with supervision;ambulating;bedside commode (over toilet) Pt Will Perform Toileting - Clothing Manipulation and hygiene: with supervision;sit to/from stand  OT Frequency: Min 2X/week   Barriers to D/C:            Co-evaluation              AM-PAC OT "6 Clicks" Daily Activity     Outcome Measure Help from another person eating meals?: None Help from another person taking care of personal grooming?: A Little Help from another person toileting, which includes using toliet, bedpan, or urinal?: A Lot Help from another person bathing (including washing, rinsing, drying)?: A Little Help from another person to put on and taking off regular upper body clothing?: A Little Help from another person to put on and taking off  regular lower body clothing?: A Little 6 Click Score: 18   End of Session Equipment Utilized During Treatment: Rolling walker  Activity Tolerance: Patient tolerated treatment well Patient left: in bed;with call bell/phone within reach  OT Visit Diagnosis: Unsteadiness on feet (R26.81);Other abnormalities of gait and mobility (R26.89);Pain;Muscle weakness (generalized) (M62.81)                Time: 6060-0459 OT Time Calculation (min): 16 min Charges:  OT General Charges $OT Visit: 1 Visit OT Evaluation $OT Eval Moderate Complexity: 1 Mod  Nestor Lewandowsky, OTR/L Acute Rehabilitation Services Pager: [phone] Office: [phone] Malka So 10/17/2019, 11:54 AM

## 2019-10-17 NOTE — Progress Notes (Signed)
Inpatient Rehabilitation Admissions Coordinator  Inpatient rehab consult received. I await therapy evaluations to assist with planning dispo.  Danne Baxter, RN, MSN Rehab Admissions Coordinator [phone] 10/17/2019 8:44 AM

## 2019-10-17 NOTE — Progress Notes (Signed)
Inpatient Rehabilitation Admissions Coordinator  Inpatient rehab consult received. I met at bedside with patient and her daughter. PT and OT recommending home with [facility] when medically ready. Patient and daughter also agree. She has numerous caregivers available at home. Will not need Cir admit if she continues to progress.  Danne Baxter, RN, MSN Rehab Admissions Coordinator [phone] 10/17/2019 2:05 PM

## 2019-10-17 NOTE — Care Management Important Message (Signed)
Important Message  Patient Details  Name: [name] MRN: [id number] Date of Birth: 1950-06-13   Medicare Important Message Given:  Yes     [name] 10/17/2019, 12:17 PM

## 2019-10-17 NOTE — Evaluation (Addendum)
Physical Therapy Evaluation Patient Details Name: [name] MRN: [id number] DOB: 13-Apr-1950 Today's Date: 10/17/2019   History of Present Illness  69 yo with history of esophageal CA admitted for esophagectomy with jejunostomy placement. Additional PMHx: CoPD, Asthma, HTN, HLD, hypothyroidism, CAD  Clinical Impression  Pt sitting in chair on arrival stating inability to sleep last night and loose stools today impacting mobility. Pt states she has not been walking as much recently because her boyfriend doesn't let her walk far for fear of falling. Pt with decreased strength, mobility, balance, transfers and gait who will benefit from acute therapy to maximize mobility, safety and function. Pt encouraged to use BSC or toilet during the day and not purewick.   HR 121 94% on 6L BP 152/85    Follow Up Recommendations Home health PT    Equipment Recommendations  None recommended by PT    Recommendations for Other Services       Precautions / Restrictions Precautions Precautions: Fall;Other (comment) Precaution Comments: NGT, chest tube, j tube      Mobility  Bed Mobility    General bed mobility comments: pt in chair on arrival and end of session  Transfers Overall transfer level: Needs assistance Equipment used: Rolling walker (2 wheeled) Transfers: Sit to/from Stand Sit to Stand: Min guard         General transfer comment: cues for hand placement to stand x 2 from recliner and x 1 from toilet  Ambulation/Gait Ambulation/Gait assistance: Min guard Gait Distance (Feet): 120 Feet Assistive device: Rolling walker (2 wheeled) Gait Pattern/deviations: Step-through pattern;Decreased stride length;Trunk flexed   Gait velocity interpretation: 1.31 - 2.62 ft/sec, indicative of limited community ambulator General Gait Details: cues for posture and pt able to self regulate distance  Stairs            Wheelchair Mobility    Modified Rankin (Stroke Patients Only)        Balance Overall balance assessment: Needs assistance Sitting-balance support: No upper extremity supported;Feet supported Sitting balance-Leahy Scale: Good     Standing balance support: Bilateral upper extremity supported Standing balance-Leahy Scale: Poor Standing balance comment: reliant on RW                             Pertinent Vitals/Pain Pain Assessment: Faces Faces Pain Scale: Hurts little more Pain Location: chest Pain Descriptors / Indicators: Aching;Guarding Pain Intervention(s): Limited activity within patient's tolerance;Monitored during session;RN gave pain meds during session;Repositioned    Home Living Family/patient expects to be discharged to:: Private residence Living Arrangements: Spouse/significant other Available Help at Discharge: Friend(s);Available 24 hours/day;Family Type of Home: Mobile home Home Access: Stairs to enter Entrance Stairs-Rails: Can reach both Entrance Stairs-Number of Steps: 4 Home Layout: One level Home Equipment: Walker - 2 wheels;Cane - single point;Grab bars - toilet;Grab bars - tub/shower;Shower seat - built in;Cane - quad      Prior Function Level of Independence: Needs assistance      ADL's / Homemaking Assistance Needed: independent in ADLs, cooks, boyfriend assists with iADLs        Hand Dominance   Dominant Hand: Right    Extremity/Trunk Assessment   Upper Extremity Assessment Upper Extremity Assessment: Overall WFL for tasks assessed    Lower Extremity Assessment Lower Extremity Assessment: Overall WFL for tasks assessed    Cervical / Trunk Assessment Cervical / Trunk Assessment: Kyphotic  Communication   Communication: No difficulties  Cognition Arousal/Alertness: Awake/alert Behavior During  Therapy: Flat affect Overall Cognitive Status: Within Functional Limits for tasks assessed                                        General Comments      Exercises      Assessment/Plan    PT Assessment Patient needs continued PT services  PT Problem List Decreased activity tolerance;Decreased balance;Decreased knowledge of use of DME;Cardiopulmonary status limiting activity;Decreased mobility;Decreased strength       PT Treatment Interventions Gait training;Functional mobility training;Stair training;Therapeutic activities;Patient/family education;DME instruction;Therapeutic exercise    PT Goals (Current goals can be found in the Care Plan section)  Acute Rehab PT Goals Patient Stated Goal: return home PT Goal Formulation: With patient Time For Goal Achievement: 10/31/19 Potential to Achieve Goals: Good    Frequency Min 3X/week   Barriers to discharge        Co-evaluation               AM-PAC PT "6 Clicks" Mobility  Outcome Measure Help needed turning from your back to your side while in a flat bed without using bedrails?: A Little Help needed moving from lying on your back to sitting on the side of a flat bed without using bedrails?: A Little Help needed moving to and from a bed to a chair (including a wheelchair)?: A Little Help needed standing up from a chair using your arms (e.g., wheelchair or bedside chair)?: A Little Help needed to walk in hospital room?: A Little Help needed climbing 3-5 steps with a railing? : A Little 6 Click Score: 18    End of Session Equipment Utilized During Treatment: Oxygen Activity Tolerance: Patient tolerated treatment well Patient left: in chair;with call bell/phone within reach;with nursing/sitter in room Nurse Communication: Mobility status PT Visit Diagnosis: Other abnormalities of gait and mobility (R26.89);Difficulty in walking, not elsewhere classified (R26.2);Muscle weakness (generalized) (M62.81)    Time: 9371-6967 PT Time Calculation (min) (ACUTE ONLY): 38 min   Charges:   PT Evaluation $PT Eval Moderate Complexity: 1 Mod PT Treatments $Therapeutic Activity: 8-22 mins         Romelo Sciandra P, PT Acute Rehabilitation Services Pager: [phone] Office: [phone]   Sandy Salaam Son Barkan 10/17/2019, 12:43 PM

## 2019-10-18 ENCOUNTER — Ambulatory Visit: Payer: Medicare Other | Admitting: Cardiology

## 2019-10-18 ENCOUNTER — Inpatient Hospital Stay (HOSPITAL_COMMUNITY): Payer: Medicare Other

## 2019-10-18 LAB — GLUCOSE, CAPILLARY
Glucose-Capillary: 120 mg/dL — ABNORMAL HIGH (ref 70–99)
Glucose-Capillary: 136 mg/dL — ABNORMAL HIGH (ref 70–99)
Glucose-Capillary: 140 mg/dL — ABNORMAL HIGH (ref 70–99)
Glucose-Capillary: 142 mg/dL — ABNORMAL HIGH (ref 70–99)
Glucose-Capillary: 167 mg/dL — ABNORMAL HIGH (ref 70–99)
Glucose-Capillary: 167 mg/dL — ABNORMAL HIGH (ref 70–99)

## 2019-10-18 LAB — SURGICAL PATHOLOGY

## 2019-10-18 MED ORDER — LEVALBUTEROL HCL 0.63 MG/3ML IN NEBU
0.6300 mg | INHALATION_SOLUTION | Freq: Three times a day (TID) | RESPIRATORY_TRACT | Status: DC
  Administered 2019-10-18 – 2019-10-22 (×13): 0.63 mg via RESPIRATORY_TRACT
  Filled 2019-10-18 (×14): qty 3

## 2019-10-18 MED ORDER — METOPROLOL TARTRATE 25 MG/10 ML ORAL SUSPENSION
37.5000 mg | Freq: Two times a day (BID) | ORAL | Status: DC
  Administered 2019-10-18 – 2019-10-19 (×3): 37.5 mg
  Filled 2019-10-18 (×3): qty 15

## 2019-10-18 NOTE — Plan of Care (Signed)
  Problem: Education: Goal: Knowledge of General Education information will improve Description: Including pain rating scale, medication(s)/side effects and non-pharmacologic comfort measures Outcome: Progressing   Problem: Clinical Measurements: Goal: Diagnostic test results will improve Outcome: Progressing   Problem: Coping: Goal: Level of anxiety will decrease Outcome: Progressing   

## 2019-10-18 NOTE — Progress Notes (Addendum)
[address]       [place] [address]             [phone]      4 Days Post-Op Procedure(s) (LRB): XI ROBOTIC ASSISTED MCKEOWN ESOPHAGECTOMY USING NIMS (N/A) VIDEO BRONCHOSCOPY (N/A) JEJUNOSTOMY PLACEMENT (N/A) INTERCOSTAL NERVE BLOCK (Right) Subjective: Anxious to get swallow study so she can begin process of advancing oral intake + airway congestion Objective: Vital signs in last 24 hours: Temp:  [97.9 F (36.6 C)-98.4 F (36.9 C)] 98.2 F (36.8 C) (10/12 0400) Pulse Rate:  [114-129] 116 (10/11 2243) Cardiac Rhythm: Sinus tachycardia;Bundle branch block (10/11 1924) Resp:  [19-21] 21 (10/11 1602) BP: (105-160)/(63-103) 148/63 (10/12 0400) SpO2:  [95 %-97 %] 96 % (10/11 1936) FiO2 (%):  [94 %] 94 % (10/11 1239) Weight:  [77.4 kg] 77.4 kg (10/12 0500)  Hemodynamic parameters for last 24 hours:    Intake/Output from previous day: 10/11 0701 - 10/12 0700 In: 55 [NG/GT:55] Out: 1300 [Urine:600; Emesis/NG output:550; Chest Tube:150] Intake/Output this shift: No intake/output data recorded.  General appearance: alert, cooperative and no distress Heart: regular rate and rhythm and tachy Lungs: coarse ronchi throughout Abdomen: soft, minor incis tenderness, + BS Extremities: no edema or calf tenderness Wound: incis healing well  Lab Results: Recent Labs    10/16/19 0714 10/17/19 0728  WBC 11.7* 10.1  HGB 10.1* 11.1*  HCT 32.7* 35.6*  PLT 136* 138*   BMET:  Recent Labs    10/16/19 0714 10/17/19 0728  NA 141 145  K 3.3* 3.4*  CL 107 109  CO2 26 25  GLUCOSE 139* 140*  BUN 13 20  CREATININE 0.94 0.93  CALCIUM 8.3* 8.9    PT/INR: No results for input(s): LABPROT, INR in the last 72 hours. ABG    Component Value Date/Time   PHART 7.387 10/08/2019 2050   HCO3 16.4 (L) 10/24/2019 2050   TCO2 21 (L) 10/10/2019 1515   ACIDBASEDEF 7.8 (H) 10/20/2019 2050   O2SAT 96.2 10/20/2019 2050   CBG (last 3)  Recent Labs     10/17/19 1954 10/18/19 0003 10/18/19 0359  GLUCAP 130* 167* 120*    Meds Scheduled Meds: . Chlorhexidine Gluconate Cloth  6 each Topical Daily  . feeding supplement (PROSource TF)  45 mL Per Tube TID  . fluticasone furoate-vilanterol  1 puff Inhalation QHS  . Gerhardt's butt cream   Topical TID  . guaiFENesin  5 mL Per Tube Q4H  . insulin aspart  0-24 Units Subcutaneous Q4H  . metoprolol tartrate  25 mg Per Tube BID  . pantoprazole (PROTONIX) IV  40 mg Intravenous Q12H   Continuous Infusions: . dextrose 5 % and 0.9% NaCl Stopped (10/16/19 1225)  . feeding supplement (OSMOLITE 1.5 CAL) 55 mL/hr at 10/18/19 0400   PRN Meds:.ketorolac, loperamide HCl  Xrays DG Chest 1 View  Result Date: 10/17/2019 CLINICAL DATA:  Right pneumothorax EXAM: CHEST  1 VIEW COMPARISON:  10/16/2019 chest radiograph and prior. FINDINGS: Small right apical pneumothorax, unchanged. Left predominant bibasilar opacities and small left pleural effusion, unchanged. Stable support devices.  Telemetry wires overlie the chest. Right chest wall subcutaneous emphysema. Partially obscured cardiomediastinal silhouette. IMPRESSION: Right apical pneumothorax, unchanged.  Indwelling right chest tube. Bibasilar opacities and small left pleural effusion, unchanged. Electronically Signed   By: Primitivo Gauze M.D.   On: 10/17/2019 08:18   DG CHEST PORT 1 VIEW  Result Date: 10/16/2019 CLINICAL DATA:  Shortness of breath EXAM: PORTABLE  CHEST 1 VIEW COMPARISON:  Chest radiograph earlier same date FINDINGS: Enteric tube courses inferior to the diaphragm. Port-A-Cath tip projects over the superior vena cava. Probable surgical drain medial left upper hemithorax. Stable cardiac and mediastinal contours. Similar bibasilar airspace opacities with small bilateral pleural effusions. Small right apical pneumothorax. Small amount of subcutaneous emphysema overlying the right lateral chest wall. Similar position right chest tube.  IMPRESSION: Small right apical pneumothorax.  Right chest tube in position. Similar bibasilar opacities and small bilateral pleural effusions. Electronically Signed   By: Lovey Newcomer M.D.   On: 10/16/2019 13:53    Assessment/Plan: S/P Procedure(s) (LRB): XI ROBOTIC ASSISTED MCKEOWN ESOPHAGECTOMY USING NIMS (N/A) VIDEO BRONCHOSCOPY (N/A) JEJUNOSTOMY PLACEMENT (N/A) INTERCOSTAL NERVE BLOCK (Right)   1 afebrile,  some HTN at times, sinus tachy- will increase beta blocker 2 sats good on 3 liters, will add xopenex  3 CT 150 cc.24/h 4 NGT 550 cc/ 24 /h 5 UOP 600 cc recorder/24 h 6 no new labs today, repeat BMET in am 7 CBG adeq control for hospitalized patient, HG A1c 5.8 on no preop meds 8 on TF's with protein supplements- swallow study today 9 CIR consult, therapies have been ordered  LOS: 4 days    John Giovanni PA-C Pager [phone] 10/18/2019   Agree with above. Esophagram ordered.  No evidence of leak we will remove NG tube. Patient will need speech therapy prior to starting any liquids given her high risk for aspiration pneumonia. Continue pulmonary toilet  Leshawn Houseworth O [place]

## 2019-10-18 NOTE — Progress Notes (Signed)
Inpatient Rehabilitation Admissions Coordinator  Inpatient rehab consult received and I met with patient and daughter on 10/11. PT and OT are recommending home with [facility] when medically ready for d/c.  Danne Baxter, RN, MSN Rehab Admissions Coordinator [phone] 10/18/2019 1:28 PM

## 2019-10-19 LAB — GLUCOSE, CAPILLARY
Glucose-Capillary: 146 mg/dL — ABNORMAL HIGH (ref 70–99)
Glucose-Capillary: 145 mg/dL — ABNORMAL HIGH (ref 70–99)
Glucose-Capillary: 149 mg/dL — ABNORMAL HIGH (ref 70–99)
Glucose-Capillary: 150 mg/dL — ABNORMAL HIGH (ref 70–99)
Glucose-Capillary: 132 mg/dL — ABNORMAL HIGH (ref 70–99)
Glucose-Capillary: 166 mg/dL — ABNORMAL HIGH (ref 70–99)
Glucose-Capillary: 165 mg/dL — ABNORMAL HIGH (ref 70–99)

## 2019-10-19 LAB — BASIC METABOLIC PANEL
Anion gap: 11 (ref 5–15)
BUN: 27 mg/dL — ABNORMAL HIGH (ref 8–23)
CO2: 25 mmol/L (ref 22–32)
Calcium: 8.7 mg/dL — ABNORMAL LOW (ref 8.9–10.3)
Chloride: 106 mmol/L (ref 98–111)
Creatinine, Ser: 0.84 mg/dL (ref 0.44–1.00)
GFR, Estimated: >60 mL/min (ref >60)
Glucose, Bld: 142 mg/dL — ABNORMAL HIGH (ref 70–99)
Potassium: 3.1 mmol/L — ABNORMAL LOW (ref 3.5–5.1)
Sodium: 142 mmol/L (ref 135–145)

## 2019-10-19 MED ORDER — LOPERAMIDE HCL 1 MG/7.5ML PO SUSP
2.0000 mg | ORAL | Status: DC | PRN
  Administered 2019-10-20 – 2019-10-22 (×4): 2 mg
  Filled 2019-10-19 (×8): qty 15

## 2019-10-19 MED ORDER — METOPROLOL TARTRATE 5 MG/5ML IV SOLN
2.5000 mg | INTRAVENOUS | Status: DC | PRN
  Administered 2019-10-19 – 2019-10-20 (×2): 2.5 mg via INTRAVENOUS
  Filled 2019-10-19 (×2): qty 5

## 2019-10-19 MED ORDER — OSMOLITE 1.5 CAL PO LIQD
1360.0000 mL | ORAL | Status: DC
  Administered 2019-10-19 – 2019-10-20 (×2): 1360 mL
  Filled 2019-10-19: qty 2000

## 2019-10-19 MED ORDER — METOPROLOL TARTRATE 25 MG/10 ML ORAL SUSPENSION
50.0000 mg | Freq: Two times a day (BID) | ORAL | Status: DC
  Administered 2019-10-19 – 2019-10-20 (×3): 50 mg
  Filled 2019-10-19 (×3): qty 20

## 2019-10-19 MED ORDER — POTASSIUM CHLORIDE 20 MEQ/15ML (10%) PO SOLN
20.0000 meq | Freq: Three times a day (TID) | ORAL | Status: AC
  Administered 2019-10-19 (×3): 20 meq
  Filled 2019-10-19 (×3): qty 15

## 2019-10-19 NOTE — Plan of Care (Signed)

## 2019-10-19 NOTE — TOC Transition Note (Signed)
Transition of Care [facility]) - CM/SW Discharge Note   Patient Details  Name: [name] MRN: [id number] Date of Birth: [birth year]  Transition of Care [facility]) CM/SW Contact:  Zenon Mayo, RN Phone Number: 10/19/2019, 4:04 PM   Clinical Narrative:    NCM spoke with patient at bedside offered choice , she states she does not have a preference, [facility] will be fine.  NCM made referral to [facility] for [facility] for nocturnal tube feeds and HHPT.  She is able to take referral.  Soc will begin Sunday. Pam with [employer] will be supplying the tube feed supplies and teaching.  NCM made referral to [facility] with [facility] for [facility], this will be brought to patient room prior to dc.    Final next level of care: [place] Barriers to Discharge: Continued Medical Work up   Patient Goals and CMS Choice Patient states their goals for this hospitalization and ongoing recovery are:: get better CMS Medicare.gov Compare Post Acute Care list provided to:: Patient Choice offered to / list presented to : Patient  Discharge Placement                       Discharge Plan and Services                DME Arranged: Bedside commode DME Agency: [employer] Date DME Agency Contacted: 10/19/19 Time DME Agency Contacted: [phone] Representative spoke with at DME Agency: [place]: RN, PT [place] Agency: [place] Date [place]: 10/19/19 Time [address]: 1603 Representative spoke with at [place]: [place]) Interventions     Readmission Risk Interventions No flowsheet data found.

## 2019-10-19 NOTE — Progress Notes (Signed)
Nutrition Follow-up  RD working remotely.  DOCUMENTATION CODES:   Not applicable  INTERVENTION:   Transition to nocturnal tube feeds: - Osmolite 1.5 @ 85 ml/hr to run over 16 hours from 1800 to 1000 (total of 1360 ml) - ProSource TF 45 ml TID  Nocturnal tube feeding regimen provides 2160 kcal, 118 grams of protein, and 1036 ml of H2O (meets 100% of needs).  - RD will monitor for diet advancement and add oral nutrition supplements as appropriate  NUTRITION DIAGNOSIS:   Increased nutrient needs related to post-op healing as evidenced by estimated needs.  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 Presents this admission for surgical management of esophageal cancer.  10/09 - s/p esophagectomy, J-tube 10/12 - esophagram with no evidence of anastomotic leak, NGT removed, clear liquids  Consult received to transition pt to nocturnal tube feeds to infuse over 16 hours.  No meal completions documented since pt has been advanced to clear liquid diet.  RD was unable to reach pt via phone call to room.  Weight on 10/10: 79 kg Current weight: 75.9 kg  Weight trending down. Unsure whether this is related to fluid status vs true dry weight loss. Suspect pt with some degree of malnutrition but unable to confirm without NFPE. Will complete at follow-up.  Current TF: Osmolite 1.5 @ 55 ml/hr with ProSource TF 45 ml TID which provides 2100 kcal, 116 grams of protein, and 1006 ml free water daily.  Medications reviewed and include: SSI q 4 hours,  protonix, KCl 20 mEq x 3  Labs reviewed: potassium 3.1, BUN 27 CBG's: 136-149 x 24 hours  CT: 120 ml x 24 hours  NUTRITION - FOCUSED PHYSICAL EXAM:  Unable to complete at this time. RD working remotely.  Diet Order:   Diet Order            Diet clear liquid Room service appropriate? Yes; Fluid consistency: Thin  Diet effective now                 EDUCATION NEEDS:   Education needs have been addressed  Skin:  Skin Assessment: Skin Integrity Issues: Incisions: R chest, abdomen x2  Last BM:  10/18/19 large type 7  Height:   Ht Readings from Last 1 Encounters:  10/24/2019 5' 2"  (1.575 m)    Weight:   Wt Readings from Last 1 Encounters:  10/19/19 75.9 kg    BMI:  Body mass index is 30.6 kg/m.  Estimated Nutritional Needs:   Kcal:  1950-2150 kcal  Protein:  100-120 gram  Fluid:  >/= 1.9 L/day    Gaynell Face, MS, RD, LDN Inpatient Clinical Dietitian Please see AMiON for contact information.

## 2019-10-19 NOTE — Evaluation (Signed)
Clinical/Bedside Swallow Evaluation Patient Details  Name: [name] MRN: [id number] Date of Birth: Dec 09, 1950  Today's Date: 10/19/2019 Time: SLP Start Time (ACUTE ONLY): 1045 SLP Stop Time (ACUTE ONLY): 1055 SLP Time Calculation (min) (ACUTE ONLY): 10 min  Past Medical History:  Past Medical History:  Diagnosis Date  . Asthma   . Cancer ([place])    esophageal cancer  . COPD (chronic obstructive pulmonary disease) ([place])   . Coronary artery disease   . Depression   . Diabetes mellitus without complication ([place])    Type II  . Diastolic dysfunction   . Diverticulosis   . Fatty liver   . GERD without esophagitis   . Hx of Clostridium difficile infection   . Hx of colonic polyps   . Hx of small bowel obstruction   . Hypertension, benign   . Hypothyroidism   . Irritable bowel syndrome   . Left bundle branch block   . Lumbar herniated disc   . Mixed hyperlipidemia   . Osteoarthritis   . PAF (paroxysmal atrial fibrillation) ([place])    06/2019  . Pneumonia   . Sleep apnea    Uses O2 at bedtime on occasion   Past Surgical History:  Past Surgical History:  Procedure Laterality Date  . ABDOMINAL HYSTERECTOMY  2002  . APPENDECTOMY  2005  . CARDIAC CATHETERIZATION    . CARPAL TUNNEL RELEASE Bilateral   . INTERCOSTAL NERVE BLOCK Right 10/19/2019   Procedure: INTERCOSTAL NERVE BLOCK;  Surgeon: Lajuana Matte, MD;  Location: [facility];  Service: Thoracic;  Laterality: Right;  . JEJUNOSTOMY N/A 10/10/2019   Procedure: JEJUNOSTOMY PLACEMENT;  Surgeon: Lajuana Matte, MD;  Location: [facility];  Service: Thoracic;  Laterality: N/A;  . LEFT HEART CATH AND CORONARY ANGIOGRAPHY N/A 12/28/2018   Procedure: LEFT HEART CATH AND CORONARY ANGIOGRAPHY;  Surgeon: Belva Crome, MD;  Location: [facility];  Service: Cardiovascular;  Laterality: N/A;  . SMALL INTESTINE SURGERY  11/26/2012   Bowel obstruction, Dr. Pauletta Browns  . TUBAL LIGATION  1980  . VIDEO BRONCHOSCOPY N/A 10/12/2019    Procedure: VIDEO BRONCHOSCOPY;  Surgeon: Lajuana Matte, MD;  Location: [facility];  Service: Thoracic;  Laterality: N/A;   HPI:  Pt is a 69 y.o. female with history of COPD, Asthma, HTN, HLD, hypothyroidism, CAD, esophageal CA admitted for esophagectomy with jejunostomy placement. Pt is currently on a clear liquid diet. Esophagram on 10/12: Swallowing mechanism is within normal limits. No evidence of anastomotic leak. Pylorus somewhat lower than the diaphragmatic hiatus. Pyloroplasty is widely patent. CXR 10/11: Bibasilar opacities and small left pleural effusion. Right apical pneumothorax. MBS 06/13/19: Oropharyngeal swallow mechanism was WNL and a dyspahgia 2 diet with thin liquids was recommended at that time.    Assessment / Plan / Recommendation Clinical Impression  Pt was seen for bedside swallow evaluation with her daughter present. She denied a history of oropharyngeal dysphagia but stated that she has not been eating anything p.o. July. Oral mechanism exam was WNL and she was edentulous. Trials were limited to clear liquids per surgery's recommendation. She tolerated clear liquids without signs or symptoms of oropharyngeal dysphagia. Her current diet may be continued with further advancement per surgery's recommendation. Further skilled SLP services are not clinically indicated for swallowing.  SLP Visit Diagnosis: Dysphagia, unspecified (R13.10)    Aspiration Risk  No limitations    Diet Recommendation Thin liquid (clear liquids)   Liquid Administration via: Cup;Straw;Spoon Medication Administration: Via alternative means  Other  Recommendations Oral Care Recommendations: Oral care BID;Patient independent with oral care   Follow up Recommendations None      Frequency and Duration            Prognosis        Swallow Study   General Date of Onset: 10/18/19 HPI: Pt is a 69 y.o. female with history of COPD, Asthma, HTN, HLD, hypothyroidism, CAD, esophageal CA admitted for  esophagectomy with jejunostomy placement. Pt is currently on a clear liquid diet. Esophagram on 10/12: Swallowing mechanism is within normal limits. No evidence of anastomotic leak. Pylorus somewhat lower than the diaphragmatic hiatus. Pyloroplasty is widely patent. CXR 10/11: Bibasilar opacities and small left pleural effusion. Right apical pneumothorax. MBS 06/13/19: Oropharyngeal swallow mechanism was WNL and a dyspahgia 2 diet with thin liquids was recommended at that time.  Type of Study: Bedside Swallow Evaluation Previous Swallow Assessment: See HPI Diet Prior to this Study: Thin liquids Temperature Spikes Noted: No Respiratory Status: Nasal cannula History of Recent Intubation: Yes Length of Intubations (days):  (for surgery) Date extubated: 10/27/2019 Behavior/Cognition: Alert;Cooperative;Pleasant mood Oral Cavity Assessment: Within Functional Limits Oral Care Completed by SLP: No Oral Cavity - Dentition: Edentulous (owns dentures but does not wear them) Vision: Functional for self-feeding Self-Feeding Abilities: Able to feed self Patient Positioning: Upright in bed;Postural control adequate for testing Baseline Vocal Quality: Normal Volitional Cough: Strong Volitional Swallow: Able to elicit    Oral/Motor/Sensory Function Overall Oral Motor/Sensory Function: Within functional limits   Ice Chips Ice chips: Within functional limits Presentation: Spoon   Thin Liquid Thin Liquid: Within functional limits Presentation: Straw;Spoon    Nectar Thick Nectar Thick Liquid: Not tested   Honey Thick Honey Thick Liquid: Not tested   Puree Puree: Not tested   Solid     Solid: Not tested     Mylena Sedberry I. Hardin Negus, [place], [place] Office number [phone] Pager [phone]  Horton Marshall 10/19/2019,11:35 AM

## 2019-10-19 NOTE — Progress Notes (Addendum)
[address]       [place] [address]             [phone]      5 Days Post-Op Procedure(s) (LRB): XI ROBOTIC ASSISTED MCKEOWN ESOPHAGECTOMY USING NIMS (N/A) VIDEO BRONCHOSCOPY (N/A) JEJUNOSTOMY PLACEMENT (N/A) INTERCOSTAL NERVE BLOCK (Right) Subjective: Awake and alert, now taking clear liquids in small amounts and says she has no difficulty swallowing.  TF infusing at goal of 23ml/hr.   No abdominal pain, BMx 2 yesterday.   Objective: Vital signs in last 24 hours: Temp:  [98 F (36.7 C)-99 F (37.2 C)] 98 F (36.7 C) (10/13 0338) Pulse Rate:  [108-122] 112 (10/13 0338) Cardiac Rhythm: Sinus tachycardia (10/12 2359) Resp:  [18-20] 18 (10/12 1500) BP: (124-144)/(66-77) 124/73 (10/12 2231) SpO2:  [92 %-96 %] 92 % (10/13 0338) Weight:  [75.9 kg] 75.9 kg (10/13 2992)    Intake/Output from previous day: 10/12 0701 - 10/13 0700 In: -  Out: 122 [Stool:2; Chest Tube:120] Intake/Output this shift: No intake/output data recorded.  General appearance: alert, cooperative and mild distress Neurologic: intact Heart: RRR, SR / ST.  Lungs: Breath sounds are full and clear today.. Thin, serous CT drainage, 14ml past 24 hours.  Abdomen: soft, expected mild peri-incisional tenderness. The J-tube is secure TF infusing at 43ml/hr..  Extremities: Warm and well perfused.   Wound:the left neck dressing is dry and the port incisions are dry. The midline abdominal incision is intact and dry.   Lab Results: Recent Labs    10/17/19 0728  WBC 10.1  HGB 11.1*  HCT 35.6*  PLT 138*   BMET:  Recent Labs    10/17/19 0728 10/19/19 0027  NA 145 142  K 3.4* 3.1*  CL 109 106  CO2 25 25  GLUCOSE 140* 142*  BUN 20 27*  CREATININE 0.93 0.84  CALCIUM 8.9 8.7*    PT/INR: No results for input(s): LABPROT, INR in the last 72 hours. ABG    Component Value Date/Time   PHART 7.387 10/24/2019 2050   HCO3 16.4 (L) 10/12/2019 2050   TCO2 21 (L) 10/22/2019 1515    ACIDBASEDEF 7.8 (H) 11/05/2019 2050   O2SAT 96.2 10/30/2019 2050   CBG (last 3)  Recent Labs    10/18/19 2107 10/18/19 2327 10/19/19 0346  GLUCAP 140* 146* 145*    Assessment/Plan: S/P Procedure(s) (LRB): XI ROBOTIC ASSISTED [place]OWN ESOPHAGECTOMY USING NIMS (N/A) VIDEO BRONCHOSCOPY (N/A) JEJUNOSTOMY PLACEMENT (N/A) INTERCOSTAL NERVE BLOCK (Right)  -POD-5  3-field esophagectomy and placement of a feeding jejunostomy.  No evidence of anastomotic leak on swallow study yesterday. Continue clear liquids. and leav chest drain in place.  Will ask dietician to gradually transition toward nocturnal TF.  Mobilize.   -Hypertension- BP improved.  Will continue metoprolol susp to 25mg  per tube BID.  -Type 2 DM- continue to manage with SSI for now.   -Volume excess-Wt still ~3kg above pre-op    -History of HTN- BP acceptable, monitor.  -DVT PPX- continue SCD's  -Disposition planning- would benefit from inpatient rehab. Anticipate she will be ready by the end of the week.  PT/OT recommending home with HH.   LOS: 5 days    Antony Odea,  [place] [phone] 10/19/2019  Agree with above. Doing well.  Started on clear liquids yesterday.  Tolerating this will follow much concern for aspiration. We will transition tube feeds to nocturnal. We will likely remove chest tube tomorrow Continue pulmonary toilet Dispel planning.  Shruti Arrey Bary Leriche

## 2019-10-19 NOTE — Progress Notes (Signed)
Physical Therapy Treatment Patient Details Name: [name] MRN: [id number] DOB: [birth year] Today's Date: 10/19/2019    History of Present Illness 69 yo with history of esophageal CA admitted for esophagectomy with jejunostomy placement. Additional PMHx: CoPD, Asthma, HTN, HLD, hypothyroidism, CAD    PT Comments    Patient progressing some with ambulation distance, but with fatigue and elevated HR (136) and decreased SpO2 (84% on 3L).  Able to recover with rest, but noted increased change in VS due to just back to bed after cleaning up and to bed from sitting in chair.  Feel she should progress for home with family support.  Daughter present today and seems very knowledgeable stating she has working in a nursing home.  PT will follow up if not d/c.   Follow Up Recommendations  Home health PT     Equipment Recommendations  None recommended by PT    Recommendations for Other Services       Precautions / Restrictions Precautions Precautions: Fall;Other (comment) Precaution Comments: chest tube, j tube    Mobility  Bed Mobility Overal bed mobility: Needs Assistance Bed Mobility: Supine to Sit;Sit to Supine     Supine to sit: HOB elevated;Min assist Sit to supine: Min assist   General bed mobility comments: assist for lifting trunk, pt just recently back to bed; so supine min A for return of legs to bed  Transfers Overall transfer level: Needs assistance Equipment used: Rolling walker (2 wheeled) Transfers: Sit to/from Stand Sit to Stand: Min guard         General transfer comment: for balance up from EOB and back to EOB  Ambulation/Gait Ambulation/Gait assistance: Min guard Gait Distance (Feet): 150 Feet Assistive device: Rolling walker (2 wheeled) Gait Pattern/deviations: Step-through pattern;Trunk flexed;Decreased stride length     General Gait Details: fatigued and pushing walker a little too far out with flexed posture   Stairs              Wheelchair Mobility    Modified Rankin (Stroke Patients Only)       Balance Overall balance assessment: Needs assistance Sitting-balance support: No upper extremity supported;Feet supported Sitting balance-Leahy Scale: Good     Standing balance support: Bilateral upper extremity supported Standing balance-Leahy Scale: Poor Standing balance comment: reliant on RW                            Cognition Arousal/Alertness: Awake/alert Behavior During Therapy: WFL for tasks assessed/performed Overall Cognitive Status: Within Functional Limits for tasks assessed                                        Exercises      General Comments General comments (skin integrity, edema, etc.): HR max 136, SpO2 84% on 3L O2 at lowest, able to recover to 90% or greater about 1.5 minutes on 4L O2; replaced on 3L at rest      Pertinent Vitals/Pain Pain Assessment: No/denies pain Faces Pain Scale: Hurts little more Pain Location: generalized and R chest tube site Pain Descriptors / Indicators: Aching;Sore Pain Intervention(s): Monitored during session;Repositioned    Home Living                      Prior Function            PT Goals (current goals can  now be found in the care plan section) Progress towards PT goals: Progressing toward goals    Frequency    Min 3X/week      PT Plan Current plan remains appropriate    Co-evaluation              AM-PAC PT "6 Clicks" Mobility   Outcome Measure  Help needed turning from your back to your side while in a flat bed without using bedrails?: None Help needed moving from lying on your back to sitting on the side of a flat bed without using bedrails?: A Little Help needed moving to and from a bed to a chair (including a wheelchair)?: A Little Help needed standing up from a chair using your arms (e.g., wheelchair or bedside chair)?: A Little Help needed to walk in hospital room?: A  Little Help needed climbing 3-5 steps with a railing? : A Little 6 Click Score: 19    End of Session Equipment Utilized During Treatment: Oxygen       PT Visit Diagnosis: Other abnormalities of gait and mobility (R26.89);Difficulty in walking, not elsewhere classified (R26.2);Muscle weakness (generalized) (M62.81)     Time: 9093-1121 PT Time Calculation (min) (ACUTE ONLY): 28 min  Charges:  $Gait Training: 8-22 mins $Therapeutic Activity: 8-22 mins                     Magda Kiel, PT Acute Rehabilitation Services Pager:[phone] [phone] 10/19/2019    Reginia Naas 10/19/2019, 2:17 PM

## 2019-10-19 NOTE — Progress Notes (Signed)
Patient HR went up to 130's while cleaning up. BP 163/87. Patient received metoprolol 37.5mg  in the morning. PA Myron notified this matter and increased metoprolol from 37.5 mg to 50 mg BID today and prn metoprolol 2.5 mg IVP and patient still has diarrhea x 3-4 today. Renewed imodium for diarrhea as well. HS [employer]

## 2019-10-20 ENCOUNTER — Encounter (HOSPITAL_COMMUNITY): Payer: Self-pay | Admitting: Thoracic Surgery (Cardiothoracic Vascular Surgery)

## 2019-10-20 ENCOUNTER — Inpatient Hospital Stay (HOSPITAL_COMMUNITY): Payer: Medicare Other

## 2019-10-20 DIAGNOSIS — L8990 Pressure ulcer of unspecified site, unspecified stage: Secondary | ICD-10-CM | POA: Insufficient documentation

## 2019-10-20 DIAGNOSIS — J9601 Acute respiratory failure with hypoxia: Secondary | ICD-10-CM

## 2019-10-20 DIAGNOSIS — C159 Malignant neoplasm of esophagus, unspecified: Secondary | ICD-10-CM | POA: Diagnosis not present

## 2019-10-20 LAB — BASIC METABOLIC PANEL
Anion gap: 10 (ref 5–15)
Anion gap: 14 (ref 5–15)
BUN: 26 mg/dL — ABNORMAL HIGH (ref 8–23)
BUN: 21 mg/dL (ref 8–23)
CO2: 25 mmol/L (ref 22–32)
CO2: 27 mmol/L (ref 22–32)
Calcium: 8.6 mg/dL — ABNORMAL LOW (ref 8.9–10.3)
Calcium: 8.4 mg/dL — ABNORMAL LOW (ref 8.9–10.3)
Chloride: 105 mmol/L (ref 98–111)
Chloride: 102 mmol/L (ref 98–111)
Creatinine, Ser: 0.75 mg/dL (ref 0.44–1.00)
Creatinine, Ser: 0.78 mg/dL (ref 0.44–1.00)
GFR, Estimated: >60 mL/min (ref >60)
GFR, Estimated: >60 mL/min (ref >60)
Glucose, Bld: 174 mg/dL — ABNORMAL HIGH (ref 70–99)
Glucose, Bld: 164 mg/dL — ABNORMAL HIGH (ref 70–99)
Potassium: 3.8 mmol/L (ref 3.5–5.1)
Potassium: 2.9 mmol/L — ABNORMAL LOW (ref 3.5–5.1)
Sodium: 140 mmol/L (ref 135–145)
Sodium: 143 mmol/L (ref 135–145)

## 2019-10-20 LAB — GLUCOSE, CAPILLARY
Glucose-Capillary: 169 mg/dL — ABNORMAL HIGH (ref 70–99)
Glucose-Capillary: 201 mg/dL — ABNORMAL HIGH (ref 70–99)
Glucose-Capillary: 169 mg/dL — ABNORMAL HIGH (ref 70–99)
Glucose-Capillary: 159 mg/dL — ABNORMAL HIGH (ref 70–99)
Glucose-Capillary: 187 mg/dL — ABNORMAL HIGH (ref 70–99)

## 2019-10-20 LAB — MAGNESIUM: Magnesium: 1.5 mg/dL — ABNORMAL LOW (ref 1.7–2.4)

## 2019-10-20 LAB — MRSA PCR SCREENING: MRSA by PCR: NEGATIVE

## 2019-10-20 MED ORDER — FUROSEMIDE 10 MG/ML IJ SOLN
40.0000 mg | Freq: Four times a day (QID) | INTRAMUSCULAR | Status: DC
  Administered 2019-10-20: 40 mg via INTRAVENOUS
  Filled 2019-10-20: qty 4

## 2019-10-20 MED ORDER — FENTANYL CITRATE (PF) 100 MCG/2ML IJ SOLN
50.0000 ug | INTRAMUSCULAR | Status: DC | PRN
  Administered 2019-10-20 – 2019-10-21 (×5): 50 ug via INTRAVENOUS
  Filled 2019-10-20 (×5): qty 2

## 2019-10-20 MED ORDER — MAGNESIUM SULFATE 4 GM/100ML IV SOLN
4.0000 g | Freq: Once | INTRAVENOUS | Status: AC
  Administered 2019-10-20: 4 g via INTRAVENOUS
  Filled 2019-10-20: qty 100

## 2019-10-20 MED ORDER — LEVALBUTEROL HCL 0.63 MG/3ML IN NEBU
0.6300 mg | INHALATION_SOLUTION | Freq: Once | RESPIRATORY_TRACT | Status: AC
  Administered 2019-10-20: 0.63 mg via RESPIRATORY_TRACT

## 2019-10-20 MED ORDER — INFLUENZA VAC A&B SA ADJ QUAD 0.5 ML IM PRSY
0.5000 mL | PREFILLED_SYRINGE | INTRAMUSCULAR | Status: AC
  Administered 2019-10-25: 0.5 mL via INTRAMUSCULAR
  Filled 2019-10-20 (×2): qty 0.5

## 2019-10-20 MED ORDER — ACETAMINOPHEN 325 MG PO TABS: 650.0000 mg | ORAL_TABLET | Freq: Four times a day (QID) | ORAL | Status: DC | PRN

## 2019-10-20 MED ORDER — POTASSIUM CHLORIDE 10 MEQ/100ML IV SOLN
10.0000 meq | INTRAVENOUS | Status: AC
  Administered 2019-10-20 (×4): 10 meq via INTRAVENOUS
  Filled 2019-10-20 (×4): qty 100

## 2019-10-20 MED ORDER — SODIUM CHLORIDE 0.9 % IV SOLN: INTRAVENOUS | Status: DC | PRN

## 2019-10-20 MED ORDER — SODIUM CHLORIDE 0.9% FLUSH
10.0000 mL | Freq: Two times a day (BID) | INTRAVENOUS | Status: DC
  Administered 2019-10-20 – 2019-11-01 (×23): 10 mL
  Administered 2019-11-02: 30 mL
  Administered 2019-11-05 – 2019-11-08 (×5): 10 mL
  Administered 2019-11-08: 40 mL
  Administered 2019-11-09 – 2019-11-17 (×8): 10 mL

## 2019-10-20 MED ORDER — FUROSEMIDE 10 MG/ML IJ SOLN
4.0000 mg/h | INTRAVENOUS | Status: DC
  Administered 2019-10-20: 4 mg/h via INTRAVENOUS
  Filled 2019-10-20: qty 25

## 2019-10-20 MED ORDER — BOOST / RESOURCE BREEZE PO LIQD CUSTOM: 1.0000 | Freq: Three times a day (TID) | ORAL | Status: DC

## 2019-10-20 MED ORDER — SODIUM CHLORIDE 0.9% FLUSH
10.0000 mL | INTRAVENOUS | Status: DC | PRN
  Administered 2019-10-29: 10 mL

## 2019-10-20 MED ORDER — PIPERACILLIN-TAZOBACTAM 3.375 G IVPB 30 MIN: 3.3750 g | Freq: Three times a day (TID) | INTRAVENOUS | Status: DC

## 2019-10-20 MED ORDER — PIPERACILLIN-TAZOBACTAM 3.375 G IVPB
3.3750 g | Freq: Three times a day (TID) | INTRAVENOUS | Status: DC
  Administered 2019-10-20 – 2019-10-26 (×18): 3.375 g via INTRAVENOUS
  Filled 2019-10-20 (×18): qty 50

## 2019-10-20 MED ORDER — LEVOTHYROXINE SODIUM 75 MCG PO TABS
75.0000 ug | ORAL_TABLET | Freq: Every day | ORAL | Status: DC
  Administered 2019-10-21 – 2019-11-13 (×22): 75 ug
  Filled 2019-10-20 (×23): qty 1

## 2019-10-20 MED ORDER — ORAL CARE MOUTH RINSE
15.0000 mL | Freq: Two times a day (BID) | OROMUCOSAL | Status: DC
  Administered 2019-10-20 – 2019-10-21 (×3): 15 mL via OROMUCOSAL

## 2019-10-20 MED ORDER — POTASSIUM CHLORIDE 20 MEQ/15ML (10%) PO SOLN
40.0000 meq | Freq: Once | ORAL | Status: AC
  Administered 2019-10-20: 40 meq
  Filled 2019-10-20: qty 30

## 2019-10-20 MED ORDER — MAGNESIUM SULFATE 2 GM/50ML IV SOLN
2.0000 g | Freq: Once | INTRAVENOUS | Status: DC
  Filled 2019-10-20: qty 50

## 2019-10-20 NOTE — Progress Notes (Signed)
OT Cancellation Note  Patient Details Name: [name] MRN: [id number] DOB: [birth year]   Cancelled Treatment:    Reason Eval/Treat Not Completed: Medical issues which prohibited therapy (Pt on bipap, transferred to ICU.)  Malka So 10/20/2019, 11:25 AM  Nestor Lewandowsky, OTR/L Acute Rehabilitation Services Pager: [phone] Office: [phone]

## 2019-10-20 NOTE — Progress Notes (Signed)
Patient ID: [name], female   DOB: 11/15/1950, 69 y.o.   MRN: [id number] TCTS Evening Rounds:  Placed on bipap this evening due to sats 87% on 15 L HFNC. sats now 94%.  Urine output ok on lasix drip 4. Replacing K+ of 2.9 and Mg2+ of 1.5.  Remains afebrile.

## 2019-10-20 NOTE — Progress Notes (Signed)
Pt placed back on BIPAP due to sats at 87% on 15L HFNC. Sats increased to 94%. RN aware.

## 2019-10-20 NOTE — Progress Notes (Signed)
Patient taken off BiPAP and placed on 12L [place] at this time. Patient tolerating well. Will continue to monitor.

## 2019-10-20 NOTE — Progress Notes (Signed)
Pt arrived on the unit on Bipap 100%, Alert, oriented, tachypnic and tachycardic.[name] Slight anxious but cooperative and pleasant. MDs are aware and saw pt at the bedside. IV started per order. Waiting on Lasix drip. Purewick changed. Rectal pouch applied. All dressings are changed (neck, chest, abdomen. Foam applied to protect the bottom. Pressure injury stage 2 noted on bilateral bottox. Port Cath accessed, no complications noted. Belongings at the bedside, family (significant other called and notified.) Pt is ok with relating him her information. Bed in low position, alarms are on, call bell in reach, continue to monitor

## 2019-10-20 NOTE — Progress Notes (Signed)
Pt c/o SOB, diaphoretic with decreased lung sounds. Pt CT was place on water seal around noon on 10/13 by MD per day shift RN. RN called Rapid Response to assess pt. Please see her note for interventions. Verbal orders received to keep patient on -20 suction and repeat chest xray in AM. Pt resting comfortably will continue to monitor.

## 2019-10-20 NOTE — Progress Notes (Addendum)
[address]       [place] [address]             [phone]      6 Days Post-Op Procedure(s) (LRB): XI ROBOTIC ASSISTED [place] ESOPHAGECTOMY USING NIMS (N/A) VIDEO BRONCHOSCOPY (N/A) JEJUNOSTOMY PLACEMENT (N/A) INTERCOSTAL NERVE BLOCK (Right) Subjective: Developed some respiratory distress last night with CXR showiig a new right basilar PTX and worsening airspace disease in LUL. The right chest tube was placed back to suction, she was placed on NRB and given a Xopenex HHN with improvement in O2 saturation. This morning, she is tachypnic and feel short of breath on 8Lnasal cannula O2.  Breathing treatment being given now.  TF infusing at goal of 30ml/hr.      Objective: Vital signs in last 24 hours: Temp:  [98.4 F (36.9 C)-98.7 F (37.1 C)] 98.4 F (36.9 C) (10/14 0418) Pulse Rate:  [100-131] 115 (10/14 0418) Cardiac Rhythm: Sinus tachycardia (10/14 0419) Resp:  [18-29] 20 (10/14 0418) BP: (117-163)/(70-97) 117/86 (10/14 0418) SpO2:  [90 %-98 %] 94 % (10/14 0418) Weight:  [76.9 kg] 76.9 kg (10/14 0500)    Intake/Output from previous day: 10/13 0701 - 10/14 0700 In: 1727.5 [P.O.:160; NG/GT:1567.5] Out: 60 [Chest Tube:60] Intake/Output this shift: No intake/output data recorded.  General appearance: alert, cooperative and moderate distress due to shortness of breath.  Neurologic: intact Heart: sinus tach 120's.  Lungs: Breath sounds are full with a few crackles on left and mild exp wheeze on the right. Thin, serous CT drainage, 45ml past 24 hours. There is no air leak.  Abdomen: soft, expected mild peri-incisional tenderness. The J-tube is secure TF infusing at 41ml/hr..  Extremities: Warm and well perfused.   Wound:the left neck dressing is dry and the port incisions are dry. The midline abdominal incision is intact and dry.   Lab Results: No results for input(s): WBC, HGB, HCT, PLT in the last 72 hours. BMET:  Recent Labs    10/19/19 0027  10/20/19 0357  NA 142 140  K 3.1* 3.8  CL 106 105  CO2 25 25  GLUCOSE 142* 174*  BUN 27* 26*  CREATININE 0.84 0.75  CALCIUM 8.7* 8.6*    PT/INR: No results for input(s): LABPROT, INR in the last 72 hours. ABG    Component Value Date/Time   PHART 7.387 10/31/2019 2050   HCO3 16.4 (L) 10/31/2019 2050   TCO2 21 (L) 10/13/2019 1515   ACIDBASEDEF 7.8 (H) 10/27/2019 2050   O2SAT 96.2 10/09/2019 2050   CBG (last 3)  Recent Labs    10/19/19 1942 10/19/19 2321 10/20/19 0419  GLUCAP 166* 165* 169*    Assessment/Plan: S/P Procedure(s) (LRB): XI ROBOTIC ASSISTED [place]OWN ESOPHAGECTOMY USING NIMS (N/A) VIDEO BRONCHOSCOPY (N/A) JEJUNOSTOMY PLACEMENT (N/A) INTERCOSTAL NERVE BLOCK (Right)  -POD-56  3-field esophagectomy and placement of a feeding jejunostomy.  No evidence of anastomotic leak on swallow study 10/12. Continue clear liquids.  Dietician gradually transitioning TF to nocturnal infusion.   -Acute respiratory distress in setting of COPD / h/o asthma. Small basilar PTX resolved on this morning's CXR, no air leak. Significant worsening of airspace disease. May be some component of volume overload. I&O not accurate with UO not recorded but Wt is several KG above pre-op.  Will give IV Lasix this AM. Continue Brio-Eilipta and Xopenex.  Leave CT to suction for now.   -Hypertension- BP improved.  Will continue metoprolol susp to 25mg  per tube BID.  -Type 2  DM- continue to manage with SSI.   -Volume excess-Wt ~8kg above pre-op, diurese today.    -History of HTN- BP acceptable, monitor.  -DVT PPX- continue SCD's  -Disposition planning-  PT/OT recommending home with HH. TF supplies requested.  LOS: 6 days    Antony Odea,  [place] [phone] 10/20/2019  Agree with above. Increased work of breathing and desaturations on nonrebreather.  We will transfer to the ICU for respiratory monitoring She currently is satting fine on BiPAP given the cervical anastomosis, I  would prefer to limit the time that she spends with positive pressure over the surgical site. I have started her on a Lasix drip given that she is 4 L positive since admission And have started her on Zosyn to cover potential aspiration given the left upper lobe new infiltrate on chest x-ray.  Aurelia Gras Bary Leriche

## 2019-10-20 NOTE — Progress Notes (Signed)
CRITICAL VALUE ALERT  Critical Value:  PCXR-Interval development of small right basilar pneumothorax and loculated pleural fluid. Correlation for appropriate function of the indwelling right chest tube is recommended. Interval development of extensive airspace infiltrate within the left upper lobe suspicious for developing acute lobar pneumonia.  Date & Time Notied:  10/20/2019 @ 0109  Provider Notified: Dr. Roxan Hockey  Orders Received/Actions taken: Leave CT to sx and get PCXR later in AM

## 2019-10-20 NOTE — Progress Notes (Signed)
Assisted with transport on BIPAP to 2H. No change in respiratory status

## 2019-10-20 NOTE — Significant Event (Signed)
Rapid Response Event Note   Reason for Call :  Respiratory distress, SpO2-88% on NRB.   Pt is s/p esophagectomy, J-tube placement-POD5.  Initial Focused Assessment:  Pt laying in bed with eyes open. Pt is alert and oriented, c/o SOB. She says her breathing has been getting worse t/o day.  Pt is tachypneic and labored. R lung sounds very diminished. L lung sounds with wheezes in the upper lobe and some scattered crackles in lower lobe. Skin cool to touch. R chest tube to water seal since 10/13 @ 1235. Site assessed and WNL. CT placed back to suction. Once to suction, air bubbles noted coming out of tube and air leak noted in [place]. After CT placed to suction, R lung sounds able to be auscultated better and SpO2 improved to 97% on NRB.   T-98.7, HR-120s, BP-154/88, RR-36, SpO2 now 99% on NRB.    Interventions:  NRB-done PTA RRT Xopenex tx PCXR-Interval development of small R basilar PTX and loculated pleural fluid. Correlation for appropriate fxn of the indwelling R chest tube recommended. Interval development extensive airspace infiltrate within the L upper lobe suspicious for developing acute lobar pneumonia. Plan of Care:  Pt is no longer labored/tachypneic. She says she feels much better. SpO2-98% on NRB.  Keep CT to sx. Wean NRB back to [place] as SpO2 and WOB allow. PCXR already ordered for later this AM. Continue to monitor pt closely. Call RRT if further assistance needed.    Event Summary:   MD Notified: Dr. Roxan Hockey notified @ [place] Call [place] End Time:0120  Dillard Essex, RN

## 2019-10-20 NOTE — Consult Note (Signed)
NAME:  [name], MRN:  [id number], DOB:  1950/09/22, LOS: 6 ADMISSION DATE:  10/30/2019, CONSULTATION DATE:  10/14 REFERRING MD:  Dr. Kipp Brood, CHIEF COMPLAINT:  SOB   Brief History   [name] is a 69 y/o female with a PMHx of esophageal squamous cell carcinoma admitted on 10/08 for esophagectomy with interval development of respiratory distress requiring BiPAP and transfer to ICU.   History of present illness   Mrs. [name] presented to [facility] for admission for elective esophagectomy on 10/08 that proceeded without any complications. She is currently POD 6.   Overnight, patient developed worsening work of breathing requiring NRB. She was noted to be diaphoretic with decreased lung sounds by the RN. Rapid response was called, who switched chest tube from water seal to suction. Air leak noted in [place]. CXR obtained overnight showed small right basilar pneumothorax with loculated pleural effusion, as well as interval development of extensive airspace infiltrate in the LUL. Repeat CXR this morning shows improvement in pneumothorax with no change in opacities. In the late morning, patient developed worsening respiratory distress requiring BiPAP placement and transfer to ICU for close monitoring.   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 Fib,   Significant Hospital Events   10/08 >> Admitted to [facility] 10/14 >> Transferred to ICU for respiratory distress   Consults:  N/A  Procedures:  10/08 >> Esophagectomy   Significant Diagnostic Tests:  CXR 10/13 >> Small right basilar pneumothorax, however, has increased in size since prior examination and there has now developed a small amount of laterally loculated pleural fluid. Extensive airspace infiltrate has now developed throughout the left upper lobe, likely infectious in the appropriate clinical setting.   CXR 10/14  >> Right basilar pneumothorax is smaller compared to earlier in the  day. No tension component. Subcutaneous air present on the right. There is extensive airspace opacity throughout the left upper lobe and left perihilar regions, similar to earlier in the day. There is patchy atelectasis in the right mid lung and right base with suspected loculated pleural effusion on the right laterally  Micro Data:  None  Antimicrobials:  Zosyn 10/14 >>    Interim history/subjective:   On re-evaluation in the afternoon, patient denies any pain or discomfort. She denies SOB and would like to try being off BiPAP  Objective   Blood pressure 122/64, pulse (!) 126, temperature 98.8 F (37.1 C), temperature source Axillary, resp. rate (!) 34, height 5\' 2"  (1.575 m), weight 76.9 kg, SpO2 97 %.    FiO2 (%):  [100 %] 100 %   Intake/Output Summary (Last 24 hours) at 10/20/2019 1135 Last data filed at 10/20/2019 1000 Gross per 24 hour  Intake 1996.5 ml  Output 60 ml  Net 1936.5 ml   Filed Weights   10/19/19 0638 10/20/19 0500 10/20/19 1103  Weight: 75.9 kg 76.9 kg 76.9 kg   Physical Exam Constitutional:      General: She is not in acute distress.    Appearance: She is normal weight.  HENT:     Head: Normocephalic and atraumatic.  Neck:     Vascular: No JVD.  Cardiovascular:     Rate and Rhythm: Regular rhythm. Tachycardia present.     Pulses: Decreased pulses (unable to palpate PD pulses, will obtain doppler).     Heart sounds: No murmur heard.   Pulmonary:     Effort: Pulmonary effort is normal. Tachypnea present. No accessory  muscle usage.     Breath sounds: No decreased breath sounds, wheezing or rales.  Musculoskeletal:     Right lower leg: No edema.     Left lower leg: No edema.  Skin:    Coloration: Skin is not mottled.     Comments: Bilateral feet cool to touch. No rashes   Neurological:     General: No focal deficit present.     Mental Status: She is alert and oriented to person, place, and time. Mental status is at baseline.  Psychiatric:         Mood and Affect: Mood normal.        Behavior: Behavior normal.    Resolved Hospital Problem list   N.A  Assessment & Plan:   # Acute Hypoxic Respiratory Failure  # Aspiration Pneumonia  # Pneumothorax (R):   # Hx of Asthma  Suspect acute hypoxia overnight likely secondary to aspiration pneumonia as well as worsening pneumothorax. Patient is euvolemic on examination with lack of vascular congestion on CXR, although weight gain has been noted since admission.  - BiPAP - Trial transition to NRB or [place] once stabilized  - Continue Zosyn (Day 1) - Continue Brio Ellipta QD and Levoalbuterol TID  - Trial of Lasix gtt - Repeat CXR in the morning  # Sinus Tachycardia # Hx of Atrial Fibrillation  ST present since admission ranging from low 100s - 130s without any evidence of A. fib. Likely multifactorial with patient's anxiety and pain playing a role as well, as well as possibly 2/2 scheduled Levalbuterol. - No anti-coagulation at this time. Previously on Xarelto  - Continue Metoprolol 2.5 mg IV PRN for HR > 120  - Continue Toradol BID   # Esophageal Squamous Cell Carcinoma s/p Esophagectomy  - Management per TCTS  - POD 6   # Non-obstructive CAD # LAD saccular aneurysm  # HTN  - Home Plavix and Aspirin on hold. - Home Imdur on hold  - Continue Metoprolol 25 mg  # Hx of Hypothyroidism  - Resume Levothyroxine. Patient confirms she takes it daily.   Best practice:  Diet: NPO on BiPAP. Tube Feeds per G Tube  Pain/Anxiety/Delirium protocol (if indicated): N/A VAP protocol (if indicated): N/A DVT prophylaxis: SCDs GI prophylaxis: Protonix Glucose control: SSI Mobility: Bedrest Code Status: FULL Family Communication: Per Primary Disposition: ICU  Labs   CBC: Recent Labs  Lab 11/04/2019 1515 10/18/2019 2146 10/15/19 0059 10/16/19 0714 10/17/19 0728  WBC  --  7.5 8.6 11.7* 10.1  HGB 9.9* 10.3* 10.2* 10.1* 11.1*  HCT 29.0* 31.9* 32.3* 32.7* 35.6*  MCV  --  91.9 91.2 92.6  91.5  PLT  --  130* 134* 136* 138*    Basic Metabolic Panel: Recent Labs  Lab 10/15/19 0059 10/15/19 0113 10/16/19 0714 10/16/19 1659 10/17/19 0728 10/19/19 0027 10/20/19 0357  NA 141  --  141  --  145 142 140  K 4.2  --  3.3*  --  3.4* 3.1* 3.8  CL 110  --  107  --  109 106 105  CO2 19*  --  26  --  25 25 25   GLUCOSE 171*  --  139*  --  140* 142* 174*  BUN 14  --  13  --  20 27* 26*  CREATININE 1.44*  --  0.94  --  0.93 0.84 0.75  CALCIUM 8.8*  --  8.3*  --  8.9 8.7* 8.6*  MG  --  1.8 1.6* 1.9  1.8  --   --   PHOS  --  2.5 2.9 2.5 1.9*  --   --    GFR: Estimated Creatinine Clearance: 62.8 mL/min (by C-G formula based on SCr of 0.75 mg/dL). Recent Labs  Lab 10/21/2019 2146 10/15/19 0059 10/16/19 0714 10/17/19 0728  WBC 7.5 8.6 11.7* 10.1    Liver Function Tests: No results for input(s): AST, ALT, ALKPHOS, BILITOT, PROT, ALBUMIN in the last 168 hours. No results for input(s): LIPASE, AMYLASE in the last 168 hours. No results for input(s): AMMONIA in the last 168 hours.  ABG    Component Value Date/Time   PHART 7.387 10/07/2019 2050   PCO2ART 27.6 (L) 10/25/2019 2050   PO2ART 76.6 (L) 11/01/2019 2050   HCO3 16.4 (L) 10/21/2019 2050   TCO2 21 (L) 11/05/2019 1515   ACIDBASEDEF 7.8 (H) 10/30/2019 2050   O2SAT 96.2 11/06/2019 2050     Coagulation Profile: No results for input(s): INR, PROTIME in the last 168 hours.  Cardiac Enzymes: No results for input(s): CKTOTAL, CKMB, CKMBINDEX, TROPONINI in the last 168 hours.  HbA1C: Hgb A1c MFr Bld  Date/Time Value Ref Range Status  10/11/2019 10:00 AM 5.8 (H) 4.8 - 5.6 % Final    Comment:    (NOTE) Pre diabetes:          5.7%-6.4%  Diabetes:              >6.4%  Glycemic control for   <7.0% adults with diabetes     CBG: Recent Labs  Lab 10/19/19 1942 10/19/19 2321 10/20/19 0419 10/20/19 0919 10/20/19 1112  GLUCAP 166* 165* 169* 201* 169*    Review of Systems:   Negative except as noted above.    Past Medical History  She,  has a past medical history of Asthma, Cancer ([place]), COPD (chronic obstructive pulmonary disease) ([place]), Coronary artery disease, Depression, Diabetes mellitus without complication ([place]), Diastolic dysfunction, Diverticulosis, Fatty liver, GERD without esophagitis, Clostridium difficile infection, colonic polyps, small bowel obstruction, Hypertension, benign, Hypothyroidism, Irritable bowel syndrome, Left bundle branch block, Lumbar herniated disc, Mixed hyperlipidemia, Osteoarthritis, PAF (paroxysmal atrial fibrillation) ([place]), Pneumonia, and Sleep apnea.   Surgical History    Past Surgical History:  Procedure Laterality Date  . ABDOMINAL HYSTERECTOMY  2002  . APPENDECTOMY  2005  . CARDIAC CATHETERIZATION    . CARPAL TUNNEL RELEASE Bilateral   . INTERCOSTAL NERVE BLOCK Right 10/07/2019   Procedure: INTERCOSTAL NERVE BLOCK;  Surgeon: Lajuana Matte, MD;  Location: [facility];  Service: Thoracic;  Laterality: Right;  . JEJUNOSTOMY N/A 10/17/2019   Procedure: JEJUNOSTOMY PLACEMENT;  Surgeon: Lajuana Matte, MD;  Location: [facility];  Service: Thoracic;  Laterality: N/A;  . LEFT HEART CATH AND CORONARY ANGIOGRAPHY N/A 12/28/2018   Procedure: LEFT HEART CATH AND CORONARY ANGIOGRAPHY;  Surgeon: Belva Crome, MD;  Location: [facility];  Service: Cardiovascular;  Laterality: N/A;  . SMALL INTESTINE SURGERY  11/26/2012   Bowel obstruction, Dr. Pauletta Browns  . TUBAL LIGATION  1980  . VIDEO BRONCHOSCOPY N/A 10/08/2019   Procedure: VIDEO BRONCHOSCOPY;  Surgeon: Lajuana Matte, MD;  Location: [facility];  Service: Thoracic;  Laterality: N/A;     Social History   reports that she quit smoking about 6 months ago. She smoked 1.00 pack per day. She has never used smokeless tobacco. She reports previous alcohol use. She reports previous drug use.   Family History   Her family history includes Colon cancer in her daughter; Lung  cancer in her brother; Ovarian cancer in  her sister; Stomach cancer in her maternal grandmother.   Allergies Allergies  Allergen Reactions  . Honey Bee Venom Protein [Bee Venom] Anaphylaxis  . Ether Nausea And Vomiting  . Nickel Other (See Comments)    infection  . Other Other (See Comments)    Pt reports that she cannot have staples placed due to infection.  . Tape Rash    Patient states that she has an allergic reaction to certain tapes. She states that she can tolerate paper tape.     Home Medications  Prior to Admission medications   Medication Sig Start Date End Date Taking? Authorizing Provider  Amino Acids-Protein Hydrolys (FEEDING SUPPLEMENT, PRO-STAT SUGAR FREE 64,) LIQD Place 30 mLs into feeding tube 2 (two) times daily. Patient taking differently: Place 30 mLs into feeding tube in the morning, at noon, in the evening, and at bedtime.  06/14/19  Yes Lavina Hamman, MD  aspirin EC 81 MG tablet Take 1 tablet (81 mg total) by mouth daily. 12/22/18  Yes Tobb, Kardie, DO  BREO ELLIPTA 100-25 MCG/INH AEPB Inhale 1 puff into the lungs at bedtime.  08/18/18  Yes [provider]  Calcium Carbonate-Vitamin D (CALCIUM 600/VITAMIN D PO) Take 1 tablet by mouth daily.   Yes [provider]  EPINEPHrine 0.3 mg/0.3 mL IJ SOAJ injection Inject 0.3 mg into the muscle as needed for anaphylaxis. 11/14/18  Yes [provider]  FLUoxetine (PROZAC) 20 MG capsule Take 20 mg by mouth daily. Pt currently not taking, as she feels she doesn't needs them at this time.   Yes [provider]  gabapentin (NEURONTIN) 300 MG capsule Take 300 mg by mouth 2 (two) times daily.  08/19/18  Yes [provider]  isosorbide mononitrate (IMDUR) 30 MG 24 hr tablet TAKE 1 TABLET BY MOUTH  DAILY Patient taking differently: Take 30 mg by mouth daily.  08/26/19  Yes Tobb, Kardie, DO  levothyroxine (SYNTHROID) 75 MCG tablet Take 75 mcg by mouth daily before breakfast.  08/23/18  Yes [provider]  metoprolol tartrate  (LOPRESSOR) 25 MG tablet Take 0.5 tablets (12.5 mg total) by mouth 2 (two) times daily. 07/12/19 10/24/2019 Yes Tobb, Kardie, DO  mometasone (NASONEX) 50 MCG/ACT nasal spray Place 2 sprays into the nose daily as needed (pain).    Yes [provider]  nitroGLYCERIN (NITROSTAT) 0.4 MG SL tablet Place 1 tablet (0.4 mg total) under the tongue every 5 (five) minutes as needed for chest pain. 07/12/19 10/10/19 Yes Tobb, Kardie, DO  oxyCODONE (OXY IR/ROXICODONE) 5 MG immediate release tablet Take 10 mg by mouth every 4 (four) hours as needed for moderate pain.  09/27/19  Yes [provider]  pantoprazole (PROTONIX) 40 MG tablet Take 40 mg by mouth daily.  10/14/18  Yes [provider]  potassium chloride 20 MEQ/15ML (10%) SOLN Take 20 mEq by mouth daily. 06/22/19  Yes [provider]  promethazine (PHENERGAN) 25 MG tablet Take 25 mg by mouth every 8 (eight) hours as needed for nausea or vomiting.  06/28/19  Yes [provider]  rosuvastatin (CRESTOR) 40 MG tablet TAKE 1 TABLET BY MOUTH  DAILY Patient taking differently: Take 40 mg by mouth daily.  08/26/19  Yes Tobb, Kardie, DO  Water For Irrigation, Sterile (FREE WATER) SOLN Place 200 mLs into feeding tube every 8 (eight) hours. 06/14/19  Yes Lavina Hamman, MD  Ascorbic Acid (VITAMIN C) 100 MG tablet Take 100 mg  by mouth daily. Patient not taking: Reported on 10/03/2019    [provider]  oxyCODONE-acetaminophen (PERCOCET) 10-325 MG tablet Take 1 tablet by mouth every 4 (four) hours as needed for pain. Patient not taking: Reported on 10/03/2019    [provider]  rifaximin (XIFAXAN) 550 MG TABS tablet Take 550 mg by mouth See admin instructions. Take 1 tablet (550 mg) by mouth twice daily for 2 weeks, hold for 4 months then resume cycle Patient not taking: Reported on 10/03/2019    [provider]  sucralfate (CARAFATE) 1 GM/10ML suspension Take 10 mLs (1 g total) by mouth 4 (four) times daily -   with meals and at bedtime. Patient not taking: Reported on 10/03/2019 06/14/19   Lavina Hamman, MD    Dr. Jose Persia Internal Medicine PGY-2   10/20/2019, 11:35 AM

## 2019-10-21 ENCOUNTER — Inpatient Hospital Stay: Payer: Self-pay

## 2019-10-21 ENCOUNTER — Other Ambulatory Visit: Payer: Self-pay

## 2019-10-21 ENCOUNTER — Inpatient Hospital Stay (HOSPITAL_COMMUNITY): Payer: Medicare Other

## 2019-10-21 DIAGNOSIS — C159 Malignant neoplasm of esophagus, unspecified: Secondary | ICD-10-CM | POA: Diagnosis not present

## 2019-10-21 DIAGNOSIS — J9601 Acute respiratory failure with hypoxia: Secondary | ICD-10-CM | POA: Diagnosis not present

## 2019-10-21 LAB — POCT I-STAT 7, (LYTES, BLD GAS, ICA,H+H)
Acid-Base Excess: 3 mmol/L — ABNORMAL HIGH (ref 0.0–2.0)
Bicarbonate: 29.2 mmol/L — ABNORMAL HIGH (ref 20.0–28.0)
Calcium, Ion: 1.17 mmol/L (ref 1.15–1.40)
HCT: 33 % — ABNORMAL LOW (ref 36.0–46.0)
Hemoglobin: 11.2 g/dL — ABNORMAL LOW (ref 12.0–15.0)
O2 Saturation: 97 %
Patient temperature: 97.8
Potassium: 4.7 mmol/L (ref 3.5–5.1)
Sodium: 142 mmol/L (ref 135–145)
TCO2: 31 mmol/L (ref 22–32)
pCO2 arterial: 50.5 mmHg — ABNORMAL HIGH (ref 32.0–48.0)
pH, Arterial: 7.368 (ref 7.350–7.450)
pO2, Arterial: 99 mmHg (ref 83.0–108.0)

## 2019-10-21 LAB — BASIC METABOLIC PANEL
Anion gap: 14 (ref 5–15)
BUN: 31 mg/dL — ABNORMAL HIGH (ref 8–23)
CO2: 25 mmol/L (ref 22–32)
Calcium: 8.2 mg/dL — ABNORMAL LOW (ref 8.9–10.3)
Chloride: 103 mmol/L (ref 98–111)
Creatinine, Ser: 1.12 mg/dL — ABNORMAL HIGH (ref 0.44–1.00)
GFR, Estimated: 50 mL/min — ABNORMAL LOW (ref >60)
Glucose, Bld: 163 mg/dL — ABNORMAL HIGH (ref 70–99)
Potassium: 3.5 mmol/L (ref 3.5–5.1)
Sodium: 142 mmol/L (ref 135–145)

## 2019-10-21 LAB — BODY FLUID CELL COUNT WITH DIFFERENTIAL
Eos, Fluid: 0 %
Lymphs, Fluid: 3 %
Monocyte-Macrophage-Serous Fluid: 2 % — ABNORMAL LOW (ref 50–90)
Neutrophil Count, Fluid: 95 % — ABNORMAL HIGH (ref 0–25)
Total Nucleated Cell Count, Fluid: 550 cu mm (ref 0–1000)

## 2019-10-21 LAB — MAGNESIUM: Magnesium: 2.4 mg/dL (ref 1.7–2.4)

## 2019-10-21 LAB — COMPREHENSIVE METABOLIC PANEL
ALT: 28 U/L (ref 0–44)
AST: 22 U/L (ref 15–41)
Albumin: 2.1 g/dL — ABNORMAL LOW (ref 3.5–5.0)
Alkaline Phosphatase: 90 U/L (ref 38–126)
Anion gap: 12 (ref 5–15)
BUN: 24 mg/dL — ABNORMAL HIGH (ref 8–23)
CO2: 28 mmol/L (ref 22–32)
Calcium: 8 mg/dL — ABNORMAL LOW (ref 8.9–10.3)
Chloride: 104 mmol/L (ref 98–111)
Creatinine, Ser: 0.81 mg/dL (ref 0.44–1.00)
GFR, Estimated: >60 mL/min (ref >60)
Glucose, Bld: 185 mg/dL — ABNORMAL HIGH (ref 70–99)
Potassium: 3.1 mmol/L — ABNORMAL LOW (ref 3.5–5.1)
Sodium: 144 mmol/L (ref 135–145)
Total Bilirubin: 1.3 mg/dL — ABNORMAL HIGH (ref 0.3–1.2)
Total Protein: 6.1 g/dL — ABNORMAL LOW (ref 6.5–8.1)

## 2019-10-21 LAB — GLUCOSE, CAPILLARY
Glucose-Capillary: 103 mg/dL — ABNORMAL HIGH (ref 70–99)
Glucose-Capillary: 110 mg/dL — ABNORMAL HIGH (ref 70–99)
Glucose-Capillary: 118 mg/dL — ABNORMAL HIGH (ref 70–99)
Glucose-Capillary: 129 mg/dL — ABNORMAL HIGH (ref 70–99)
Glucose-Capillary: 147 mg/dL — ABNORMAL HIGH (ref 70–99)
Glucose-Capillary: 152 mg/dL — ABNORMAL HIGH (ref 70–99)
Glucose-Capillary: 164 mg/dL — ABNORMAL HIGH (ref 70–99)
Glucose-Capillary: 178 mg/dL — ABNORMAL HIGH (ref 70–99)

## 2019-10-21 LAB — CBC
HCT: 35.9 % — ABNORMAL LOW (ref 36.0–46.0)
Hemoglobin: 11.3 g/dL — ABNORMAL LOW (ref 12.0–15.0)
MCH: 28.5 pg (ref 26.0–34.0)
MCHC: 31.5 g/dL (ref 30.0–36.0)
MCV: 90.4 fL (ref 80.0–100.0)
Platelets: 241 10*3/uL (ref 150–400)
RBC: 3.97 MIL/uL (ref 3.87–5.11)
RDW: 14.3 % (ref 11.5–15.5)
WBC: 13.9 10*3/uL — ABNORMAL HIGH (ref 4.0–10.5)
nRBC: 0 % (ref 0.0–0.2)

## 2019-10-21 LAB — PHOSPHORUS: Phosphorus: 3.8 mg/dL (ref 2.5–4.6)

## 2019-10-21 MED ORDER — ENOXAPARIN SODIUM 40 MG/0.4ML ~~LOC~~ SOLN
40.0000 mg | SUBCUTANEOUS | Status: DC
  Administered 2019-10-21: 40 mg via SUBCUTANEOUS
  Filled 2019-10-21: qty 0.4

## 2019-10-21 MED ORDER — MIDAZOLAM HCL 2 MG/2ML IJ SOLN: 2.0000 mg | Freq: Once | INTRAMUSCULAR | Status: AC

## 2019-10-21 MED ORDER — FENTANYL CITRATE (PF) 100 MCG/2ML IJ SOLN: 100.0000 ug | Freq: Once | INTRAMUSCULAR | Status: AC

## 2019-10-21 MED ORDER — ACETAMINOPHEN 10 MG/ML IV SOLN
1000.0000 mg | Freq: Four times a day (QID) | INTRAVENOUS | Status: AC | PRN
  Administered 2019-10-21: 1000 mg via INTRAVENOUS
  Filled 2019-10-21: qty 100

## 2019-10-21 MED ORDER — POTASSIUM CHLORIDE 20 MEQ/15ML (10%) PO SOLN
40.0000 meq | Freq: Once | ORAL | Status: AC
  Administered 2019-10-21: 40 meq
  Filled 2019-10-21: qty 30

## 2019-10-21 MED ORDER — POLYETHYLENE GLYCOL 3350 17 G PO PACK
17.0000 g | PACK | Freq: Every day | ORAL | Status: DC
  Administered 2019-10-23 – 2019-11-06 (×9): 17 g via ORAL
  Filled 2019-10-21 (×12): qty 1

## 2019-10-21 MED ORDER — FENTANYL CITRATE (PF) 100 MCG/2ML IJ SOLN
12.5000 ug | INTRAMUSCULAR | Status: DC | PRN
  Administered 2019-10-21: 25 ug via INTRAVENOUS
  Filled 2019-10-21: qty 2

## 2019-10-21 MED ORDER — VITAL AF 1.2 CAL PO LIQD
1000.0000 mL | ORAL | Status: DC
  Administered 2019-10-21 – 2019-10-26 (×6): 1000 mL
  Filled 2019-10-21: qty 1000

## 2019-10-21 MED ORDER — FENTANYL CITRATE (PF) 100 MCG/2ML IJ SOLN
INTRAMUSCULAR | Status: AC
  Administered 2019-10-21: 100 ug via INTRAVENOUS
  Filled 2019-10-21: qty 2

## 2019-10-21 MED ORDER — CHLORHEXIDINE GLUCONATE 0.12% ORAL RINSE (MEDLINE KIT)
15.0000 mL | Freq: Two times a day (BID) | OROMUCOSAL | Status: DC
  Administered 2019-10-21 – 2019-10-24 (×7): 15 mL via OROMUCOSAL

## 2019-10-21 MED ORDER — FENTANYL CITRATE (PF) 100 MCG/2ML IJ SOLN
25.0000 ug | INTRAMUSCULAR | Status: DC | PRN
  Administered 2019-10-21 – 2019-10-22 (×2): 100 ug via INTRAVENOUS
  Filled 2019-10-21 (×2): qty 2

## 2019-10-21 MED ORDER — PROPOFOL 1000 MG/100ML IV EMUL
INTRAVENOUS | Status: AC
  Administered 2019-10-21: 10 ug/kg/min via INTRAVENOUS
  Filled 2019-10-21: qty 100

## 2019-10-21 MED ORDER — MIDAZOLAM HCL (PF) 5 MG/ML IJ SOLN: 2.0000 mg | Freq: Once | INTRAMUSCULAR | Status: DC

## 2019-10-21 MED ORDER — NOREPINEPHRINE 4 MG/250ML-% IV SOLN
INTRAVENOUS | Status: AC
  Administered 2019-10-21: 10 ug/min via INTRAVENOUS
  Filled 2019-10-21: qty 250

## 2019-10-21 MED ORDER — NOREPINEPHRINE 4 MG/250ML-% IV SOLN
0.0000 ug/min | INTRAVENOUS | Status: DC
  Administered 2019-10-21: 10 ug/min via INTRAVENOUS
  Administered 2019-10-22: 11 ug/min via INTRAVENOUS
  Administered 2019-10-22: 16 ug/min via INTRAVENOUS
  Administered 2019-10-22: 13 ug/min via INTRAVENOUS
  Administered 2019-10-22: 8 ug/min via INTRAVENOUS
  Administered 2019-10-22: 11 ug/min via INTRAVENOUS
  Administered 2019-10-23: 8 ug/min via INTRAVENOUS
  Filled 2019-10-21 (×8): qty 250

## 2019-10-21 MED ORDER — PROSOURCE TF PO LIQD
45.0000 mL | Freq: Two times a day (BID) | ORAL | Status: DC
  Administered 2019-10-21 – 2019-10-27 (×12): 45 mL
  Filled 2019-10-21 (×12): qty 45

## 2019-10-21 MED ORDER — DEXMEDETOMIDINE HCL IN NACL 400 MCG/100ML IV SOLN
0.0000 ug/kg/h | INTRAVENOUS | Status: DC
  Administered 2019-10-21: 0.4 ug/kg/h via INTRAVENOUS
  Filled 2019-10-21: qty 100

## 2019-10-21 MED ORDER — ETOMIDATE 2 MG/ML IV SOLN
20.0000 mg | Freq: Once | INTRAVENOUS | Status: AC
  Administered 2019-10-21: 20 mg via INTRAVENOUS

## 2019-10-21 MED ORDER — SODIUM CHLORIDE 0.9% FLUSH
10.0000 mL | Freq: Two times a day (BID) | INTRAVENOUS | Status: DC
  Administered 2019-10-21 – 2019-11-07 (×31): 10 mL
  Administered 2019-11-08: 20 mL
  Administered 2019-11-08 – 2019-11-17 (×12): 10 mL

## 2019-10-21 MED ORDER — LIDOCAINE HCL (PF) 1 % IJ SOLN
INTRAMUSCULAR | Status: AC
  Filled 2019-10-21: qty 5

## 2019-10-21 MED ORDER — ORAL CARE MOUTH RINSE
15.0000 mL | OROMUCOSAL | Status: DC
  Administered 2019-10-21 – 2019-10-23 (×23): 15 mL via OROMUCOSAL

## 2019-10-21 MED ORDER — LEVALBUTEROL HCL 0.63 MG/3ML IN NEBU
0.6300 mg | INHALATION_SOLUTION | RESPIRATORY_TRACT | Status: DC | PRN
  Administered 2019-10-21: 0.63 mg via RESPIRATORY_TRACT
  Filled 2019-10-21 (×2): qty 3

## 2019-10-21 MED ORDER — PROPOFOL 1000 MG/100ML IV EMUL
0.0000 ug/kg/min | INTRAVENOUS | Status: DC
  Administered 2019-10-21: 15 ug/kg/min via INTRAVENOUS
  Administered 2019-10-22: 30 ug/kg/min via INTRAVENOUS
  Administered 2019-10-22: 15 ug/kg/min via INTRAVENOUS
  Administered 2019-10-22: 16 ug/kg/min via INTRAVENOUS
  Administered 2019-10-23: 25 ug/kg/min via INTRAVENOUS
  Administered 2019-10-23: 12 ug/kg/min via INTRAVENOUS
  Filled 2019-10-21 (×6): qty 100

## 2019-10-21 MED ORDER — MAGNESIUM SULFATE 4 GM/100ML IV SOLN: 4.0000 g | Freq: Once | INTRAVENOUS | Status: DC

## 2019-10-21 MED ORDER — PHENYLEPHRINE 40 MCG/ML (10ML) SYRINGE FOR IV PUSH (FOR BLOOD PRESSURE SUPPORT)
PREFILLED_SYRINGE | INTRAVENOUS | Status: AC
  Filled 2019-10-21: qty 10

## 2019-10-21 MED ORDER — FENTANYL CITRATE (PF) 100 MCG/2ML IJ SOLN: 25.0000 ug | INTRAMUSCULAR | Status: DC | PRN

## 2019-10-21 MED ORDER — MIDAZOLAM HCL 2 MG/2ML IJ SOLN
INTRAMUSCULAR | Status: AC
  Administered 2019-10-21: 2 mg via INTRAVENOUS
  Filled 2019-10-21: qty 2

## 2019-10-21 MED ORDER — ROCURONIUM BROMIDE 10 MG/ML (PF) SYRINGE
60.0000 mg | PREFILLED_SYRINGE | Freq: Once | INTRAVENOUS | Status: AC
  Administered 2019-10-21: 60 mg via INTRAVENOUS
  Filled 2019-10-21: qty 6

## 2019-10-21 MED ORDER — SODIUM CHLORIDE 0.9% FLUSH
10.0000 mL | INTRAVENOUS | Status: DC | PRN
  Administered 2019-10-26: 10 mL

## 2019-10-21 MED ORDER — POTASSIUM CHLORIDE 10 MEQ/100ML IV SOLN
10.0000 meq | INTRAVENOUS | Status: AC
  Administered 2019-10-21 (×4): 10 meq via INTRAVENOUS
  Filled 2019-10-21 (×3): qty 100

## 2019-10-21 MED ORDER — SENNOSIDES-DOCUSATE SODIUM 8.6-50 MG PO TABS
1.0000 | ORAL_TABLET | Freq: Every day | ORAL | Status: DC
  Administered 2019-10-21 – 2019-11-05 (×12): 1 via ORAL
  Filled 2019-10-21 (×12): qty 1

## 2019-10-21 NOTE — Progress Notes (Signed)
Nutrition Follow-up  DOCUMENTATION CODES:   Not applicable  INTERVENTION:   Transition back to 24-hour continuous tube feeds via J-tube: - Vital AF 1.2 @ 45 ml/hr (1080 ml/day) - ProSource TF 45 ml BID  Tube feeding regimen provides 1376 kcal, 103 grams of protein, and 876 ml of H2O.   Tube feeding regimen and current propofol provides 1497 total kcal (102% of needs).  NUTRITION DIAGNOSIS:   Increased nutrient needs related to post-op healing as evidenced by estimated needs.  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 Presents this admission for surgical management of esophageal cancer.  10/09 - s/p esophagectomy, J-tube 10/12 - esophagram with no evidence of anastomotic leak, NGT removed, clear liquids 10/13 - tube feeds transitioned to nocturnal 10/14 - rapid response, CXR showing new right basilar pneumothorax and worsening airspace disease in LUL, chest tube placed back to suction 10/15 - intubated  Discussed pt with RN and during ICU rounds. Pt now intubated. RD will transition pt back to continuous 24-hour feeds.  Admit weight: 73 kg Current weight: 73.1 kg  Pt with +1 pitting generalized edema per flowsheet documentation.  Patient is currently intubated on ventilator support MV: 10.5 L/min Temp (24hrs), Avg:97.6 F (36.4 C), Min:96.9 F (36.1 C), Max:97.8 F (36.6 C) BP (a-line): 90/52 MAP (a-line): 65  Drips: Propofol: 4.6 ml/hr (provides 121 kcal daily from lipid) Lasix  Medications reviewed and include: SSI q 4  hours, protonix, miralax, senna, IV abx, IV KCl 10 mEq x 4 runs  Labs reviewed: potassium 3.1 CBG's: 147-187 x 24 hours  UOP: 1800 ml x 24 hours Stool: 200 ml x 24 hours CT: 150 ml x 24 hours I/O's: +6.6 L since admit  NUTRITION - FOCUSED PHYSICAL EXAM:  Unable to complete. Pt undergoing sterile procedure at time of RD visit.  Diet Order:   Diet Order    None      EDUCATION NEEDS:   Education needs have been addressed  Skin:  Skin Assessment: Skin Integrity Issues:: Stage II: bilateral buttocks Incisions: right chest, abdomen x 2  Last BM:  10/21/19 large type 7  Height:   Ht Readings from Last 1 Encounters:  10/20/19 5' 2"  (6.812 m)    Weight:   Wt Readings from Last 1 Encounters:  10/21/19 73.1 kg    BMI:  Body mass index is 29.48 kg/m.  Estimated Nutritional Needs:   Kcal:  1469  Protein:  100-120 gram  Fluid:  >/= 1.5 L    Gaynell Face, MS, RD, LDN Inpatient Clinical Dietitian Please see [facility] for contact information.

## 2019-10-21 NOTE — Progress Notes (Signed)
[facility] Progress Note Patient Name: [name] DOB: 25-Feb-1950 MRN: [id number]   Date of Service  10/21/2019  HPI/Events of Note  Patient is on BIPAP and slightly anxious but her blood pressure is too soft for low level Precedex at this time. She is getting 50 mcg of Fentanyl Q 1 hour PRN chest tube site pain.  eICU Interventions  Will re-evaluate patient for starting low level Precedex if blood pressure firms up. Tylenol iv ordered x 24 hours as a narcotic sparing adjunct for pain, and Fentanyl dose reduced to 12.5 - 25 mcg to reduce risk of respiratory depression.        [name] [name] 10/21/2019, 3:45 AM

## 2019-10-21 NOTE — Progress Notes (Addendum)
Left anterior neck drain noted to be draining copious brown foul smelling liquid when  the patient in a supine position while being cleaned. A collection pouch was placed over the drain, being careful to avoid the surgical incision close to the drain to protect the patients skin.

## 2019-10-21 NOTE — Progress Notes (Signed)
[facility] ADULT ICU REPLACEMENT PROTOCOL   The patient does apply for the [facility] Adult ICU Electrolyte Replacment Protocol based on the criteria listed below:   1. Is GFR >/= 30 ml/min? Yes.    Patient's GFR today is >60 2. Is SCr </= 2? Yes.   Patient's SCr is 0.78 ml/kg/hr 3. Did SCr increase >/= 0.5 in 24 hours? No. 4. Abnormal electrolyte(s): k 2.9, Mag 1.5 5. Ordered repletion with: protocol 6. If a panic level lab has been reported, has the CCM MD in charge been notified? Yes.  .   Physician:  Dr Brandt Loosen, Josseline Reddin A 10/21/2019 4:55 AM

## 2019-10-21 NOTE — Progress Notes (Signed)
PT Cancellation Note  Patient Details Name: [name] MRN: [id number] DOB: 1950/12/31   Cancelled Treatment:    Reason Eval/Treat Not Completed: Patient not medically ready (pt on Bipap awaiting intubation)   [name] B [name] 10/21/2019, 7:58 AM  [name], PT Acute Rehabilitation Services Pager: [phone] Office: [phone]

## 2019-10-21 NOTE — Discharge Instructions (Signed)
Esophagectomy, Care After This sheet gives you information about how to care for yourself after your procedure. Your health care provider may also give you more specific instructions. If you have problems or questions, contact your health care provider. What can I expect after the procedure? After the procedure, it is common to have:  Soreness near the incision sites.  Soreness when swallowing food.  Tiredness (fatigue).  Loss of appetite.  Nausea.  Heartburn.  Diarrhea. Follow these instructions at home: Feeding tube  If you were sent home with a feeding tube, follow instructions from your health care provider about taking care of the tube.  Work with your nutrition care provider for using your feeding tube and getting enough nutrition. Eating and drinking  Follow instructions from your health care provider about eating or drinking restrictions. You may need to eat smaller and more frequent meals.  Drink enough fluid to keep your urine clear or pale yellow. Activity  Do not lift anything that is heavier than 10 lb (4.5 kg) until your surgeon says you can.  Do not drive or operate heavy machinery while taking prescription pain medicine.  Return to your normal activities as told by your health care provider. Ask your health care provider what activities are safe for you. Incision care    Follow instructions from your health care provider about how to take care of your incision. Make sure you: ? Change your bandage (dressing) as told by your health care provider. ? Wash your hands with soap and water before you change your dressing. If soap and water are not available, use hand sanitizer. ? Leave stitches (sutures), skin glue, or adhesive strips in place. These skin closures may need to stay in place for 2 weeks or longer. If adhesive strip edges start to loosen and curl up, you may trim the loose edges. Do not remove adhesive strips completely unless your health care  provider tells you to do that.  Do not take baths, swim, or use a hot tub until your health care provider approves  Check your incision areas every day for signs of infection. Check for: ? More redness, swelling, or pain. ? More fluid or blood. ? Warmth. ? Pus or a bad smell. General instructions  To prevent or treat constipation while you are taking prescription pain medicine, your health care provider may recommend that you: ? Take over-the-counter or prescription medicines. ? Eat foods that are high in fiber, such as fresh fruits and vegetables, whole grains, and beans. ? Limit foods that are high in fat and processed sugars, such as fried and sweet foods.  Take over-the-counter and prescription medicines only as told by your health care provider.  Wear compression stockings as told by your health care provider. These stockings help prevent blood clots and reduce swelling in your legs.  Do not use any products that contain nicotine or tobacco, such as cigarettes and e-cigarettes. If you need help quitting, ask your health care provider.  Keep all follow-up visits as told by your health care provider. This is important. Contact a health care provider if:  You have problems or questions about your feeding tube.  You have chills or fever.  You have pain that is not controlled by your pain medicine.  You have trouble swallowing.  You have persistent or worsening heartburn.  You have persistent or worsening diarrhea.  You have more redness, swelling, or pain around your incision area.  You have more fluid coming from your incision   area.  Your incision feels warm to the touch.  You have pus or a bad smell coming from your incision area.  You have a persistent or worsening cough. Get help right away if:  You have severe pain.  You are bleeding from an incision area.  You are vomiting blood.  You are unable to swallow.  You have trouble breathing.  You have chest  pain. Summary  Expect some discomfort and fatigue during your recovery.  Work with your nutrition care provider if you still have a feeding tube at home.  Follow your surgeon's directions for care of your incision areas.  Return to normal activities gradually, as told by your health care provider. This information is not intended to replace advice given to you by your health care provider. Make sure you discuss any questions you have with your health care provider. Document Revised: 12/05/2016 Document Reviewed: 08/23/2015 Elsevier Patient Education  2020 [employer]   

## 2019-10-21 NOTE — Progress Notes (Signed)
[address]       [place] [address]             [phone]                 7 Days Post-Op Procedure(s) (LRB): XI ROBOTIC ASSISTED [place] ESOPHAGECTOMY USING NIMS (N/A) VIDEO BRONCHOSCOPY (N/A) JEJUNOSTOMY PLACEMENT (N/A) INTERCOSTAL NERVE BLOCK (Right)   Events: Remained on bipap overnight.  Very anxious Intubated this am Bronch showed thick secretions Started draining from her neck incision _______________________________________________________________ Vitals: BP (!) 110/56    Pulse (!) 110    Temp 97.8 F (36.6 C) (Axillary)    Resp (!) 24    Ht 5\' 2"  (1.575 m)    Wt 73.1 kg    SpO2 100%    BMI 29.48 kg/m   - Neuro: sedated  - Cardiovascular: sinus tach     - Pulm:  Vent Mode: PRVC FiO2 (%):  [40 %-100 %] 100 % Set Rate:  [24 bmp] 24 bmp Vt Set:  [400 mL] 400 mL PEEP:  [5 cmH20] 5 cmH20 Plateau Pressure:  [20 cmH20] 20 cmH20  ABG    Component Value Date/Time   PHART 7.387 10/12/2019 2050   PCO2ART 27.6 (L) 11/01/2019 2050   PO2ART 76.6 (L) 10/23/2019 2050   HCO3 16.4 (L) 10/22/2019 2050   TCO2 21 (L) 10/18/2019 1515   ACIDBASEDEF 7.8 (H) 10/08/2019 2050   O2SAT 96.2 10/20/2019 2050    - Abd: soft - Extremity:   .Intake/Output      10/14 0701 - 10/15 0700 10/15 0701 - 10/16 0700   P.O.     I.V. (mL/kg) 516 (7.1)    Other 435    NG/GT 2929.9    IV Piggyback 871.9    Total Intake(mL/kg) 4752.7 (65)    Urine (mL/kg/hr) 1800 (1)    Drains 0    Stool 200    Chest Tube 150    Total Output 2150    Net +2602.7         Urine Occurrence 4 x    Stool Occurrence 5 x       _______________________________________________________________ Labs: CBC Latest Ref Rng & Units 10/21/2019 10/17/2019 10/16/2019  WBC 4.0 - 10.5 K/uL 13.9(H) 10.1 11.7(H)  Hemoglobin 12.0 - 15.0 g/dL 11.3(L) 11.1(L) 10.1(L)  Hematocrit 36 - 46 % 35.9(L) 35.6(L) 32.7(L)  Platelets 150 - 400 K/uL 241 138(L) 136(L)   CMP Latest Ref Rng & Units  10/21/2019 10/20/2019 10/20/2019  Glucose 70 - 99 mg/dL 185(H) 164(H) 174(H)  BUN 8 - 23 mg/dL 24(H) 21 26(H)  Creatinine 0.44 - 1.00 mg/dL 0.81 0.78 0.75  Sodium 135 - 145 mmol/L 144 143 140  Potassium 3.5 - 5.1 mmol/L 3.1(L) 2.9(L) 3.8  Chloride 98 - 111 mmol/L 104 102 105  CO2 22 - 32 mmol/L 28 27 25   Calcium 8.9 - 10.3 mg/dL 8.0(L) 8.4(L) 8.6(L)  Total Protein 6.5 - 8.1 g/dL 6.1(L) - -  Total Bilirubin 0.3 - 1.2 mg/dL 1.3(H) - -  Alkaline Phos 38 - 126 U/L 90 - -  AST 15 - 41 U/L 22 - -  ALT 0 - 44 U/L 28 - -    CXR: ET tube in good position No significant effusion on the right Diffuse interstitial changes on the L  _______________________________________________________________  Assessment and Plan: POD 7 s/p robotic 3 field esophagectomy  Neuro: sedation while vented CV: sinus tach - on BB Pulm: continue vent support.  Renal: stable.  Will replete K GI: on J-tube feeds.  Will open neck incision, and begin open packing Heme: stable ID: on zosyn Endo: SSI Dispo: continue ICU care  Melodie Bouillon, MD 10/21/2019 9:33 AM

## 2019-10-21 NOTE — Procedures (Signed)
Bronchoscopy Procedure Note  [name]  [id number]  [birth year]  Date:10/21/19  Time:8:27 AM   Provider Performing:Vasilis Luhman C Tamala Julian   Procedure(s):  Flexible bronchoscopy with bronchial alveolar lavage [phone])  Indication(s) LUL infiltrate  Consent Discussed verbally with patient prior to intubation  Anesthesia Etomidate, Versed, Fentanyl, Rocuronium in place from ETT insertion   Time Out Verified patient identification, verified procedure, site/side was marked, verified correct patient position, special equipment/implants available, medications/allergies/relevant history reviewed, required imaging and test results available.   Sterile Technique Usual hand hygiene, masks, gowns, and gloves were used   Procedure Description Bronchoscope advanced through endotracheal tube and into airway.  Airways were examined down to subsegmental level with findings noted below.   Following diagnostic evaluation, BAL(s) performed in LUL with normal saline and return of clear fluid with mucus plugs fluid  Findings:  - ETT in good position - Mild chronic bronchitis - Significant diffuse mucus plugging worse on left, suctioned   Complications/Tolerance None; patient tolerated the procedure well. Chest X-ray is not needed post procedure.   EBL Minimal   Specimen(s) BAL LUL

## 2019-10-21 NOTE — Progress Notes (Signed)
OT Cancellation Note  Patient Details Name: MERIAL MORITZ MRN: [id number] DOB: 07/18/1950   Cancelled Treatment:    Reason Eval/Treat Not Completed: Medical issues which prohibited therapy (Pt on bipap, awaiting intubation. )  Malka So 10/21/2019, 8:00 AM  Nestor Lewandowsky, OTR/L Acute Rehabilitation Services Pager: [phone] Office: [phone]

## 2019-10-21 NOTE — Progress Notes (Signed)
NAME:  [name], MRN:  [id number], DOB:  09/20/1950, LOS: 7 ADMISSION DATE:  11/04/2019, CONSULTATION DATE: 10/20/2019 REFERRING MD:  Dr. Kipp Brood, CHIEF COMPLAINT:  SOB   Brief History   [name] is a 69 y/o female with a PMHx of esophageal squamous cell carcinoma admitted on 10/08 for esophagectomy with interval development of respiratory distress requiring BiPAP and transfer to ICU.   History of present illness   [name] presented to [facility] for admission for elective esophagectomy on 10/08 that proceeded without any complications. She is currently POD 6.   Overnight, patient developed worsening work of breathing requiring NRB. She was noted to be diaphoretic with decreased lung sounds by the RN. Rapid response was called, who switched chest tube from water seal to suction. Air leak noted in [place]. CXR obtained overnight showed small right basilar pneumothorax with loculated pleural effusion, as well as interval development of extensive airspace infiltrate in the LUL. Repeat CXR this morning shows improvement in pneumothorax with no change in opacities. In the late morning, patient developed worsening respiratory distress requiring BiPAP placement and transfer to ICU for close monitoring.  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 Fib,   Significant Hospital Events   10/08 >> Admitted to [facility] 10/14 >> Transferred to ICU for respiratory distress   Consults:  PCCM  Procedures:  10/08 >> Esophagectomy 10/15 >> ETT placed, Bronchoscopy     Significant Diagnostic Tests:  CXR 10/13 >> Small right basilar pneumothorax, however, has increased in size since prior examination and there has now developed a small amount of laterally loculated pleural fluid. Extensive airspace infiltrate has now developed throughout the left upper lobe, likely infectious in the appropriate clinical setting.   CXR 10/14  >> Right basilar  pneumothorax is smaller compared to earlier in the day. No tension component. Subcutaneous air present on the right. There is extensive airspace opacity throughout the left upper lobe and left perihilar regions, similar to earlier in the day. There is patchy atelectasis in the right mid lung and right base with suspected loculated pleural effusion on the right laterally  Micro Data:  None  Antimicrobials:  Zosyn 10/14 >>   Interim history/subjective:   Overnight, patient had difficulty tolerating BiPAP however, she desatted to the low 80s on HFNC. She continued to be tachycardiac, however improved compared to yesterday.   Objective   Blood pressure 102/66, pulse (!) 107, temperature 97.7 F (36.5 C), temperature source Axillary, resp. rate (!) 21, height _0  (1.575 m), weight 73.1 kg, SpO2 96 %.    FiO2 (%):  [60 %-100 %] 60 %   Intake/Output Summary (Last 24 hours) at 10/21/2019 0733 Last data filed at 10/21/2019 0700 Gross per 24 hour  Intake 4752.72 ml  Output 2080 ml  Net 2672.72 ml   Filed Weights   10/20/19 0500 10/20/19 1103 10/21/19 0500  Weight: 76.9 kg 76.9 kg 73.1 kg   Examination: General: Frail, ill-appearing female HENT: Mucus membranes dry. Lungs: Intubated. Clear to auscultation on the right, left with minimal rales.  Cardiovascular: Regular rhythm with rates in the low 100s. No murmurs Abdomen: Soft. Hypoactive bowel sounds.  Extremities: No pitting edema.  Neuro: Prior to sedation, alert and oriented. Able to answer questions appropriately.  Skin: Incision on left lateral neck with brown fluid discharge.   Resolved Hospital Problem list   N/A  Assessment & Plan:  # Acute Hypoxic Respiratory Failure  #  Aspiration Pneumonia  # Pneumothorax (R): Resolved # Hx of Asthma  - Patient intubated today for persistent hypoxia and difficulty tolerating NIV.  - CXR prior to intubation shows persistent opacities on the left with resolution of pneumothorax on the  right with right CT placed on suction.  - VAP prevention measures in place - Fentanyl and Propofol gtt for analgesia and sedation  - BAL Respiratory cultures sent; results pending  - Continue Zosyn (Day 2) - Continue Brio Ellipta QD and Levoalbuterol TID  - Continue Lasix gtt  # Sinus Tachycardia # Hx of Atrial Fibrillation  - ST present since admission ranging from low 100s - 130s without any evidence of A. fib.  - Likely multifactorial with patient's anxiety and pain playing a role as well, as well as possibly 2/2 scheduled Levalbuterol. Will continue to monitor now that she is sedated.  - No anti-coagulation at this time. Previously on Xarelto   # Esophageal Squamous Cell Carcinoma s/p Esophagectomy  - Management per TCTS  - POD 7 - Given brown discharge from neck incision, will send out cultures   # Non-obstructive CAD # LAD saccular aneurysm  # HTN  - Home Plavix and Aspirin on hold. - Home Imdur on hold  - Discontinue home metoprolol given need for Levo post-intubation   # Hx of Hypothyroidism  - Continue daily Levothyroxine  Best practice:  Diet: Tube Feeds per G Tube  Pain/Anxiety/Delirium protocol (if indicated): Propofol + Fentanyl  VAP protocol (if indicated): Ordered  DVT prophylaxis: SCDs GI prophylaxis: Protonix Glucose control: SSI Mobility: Bedrest Code Status: FULL Family Communication: Per Primary Disposition: ICU  Labs   CBC: Recent Labs  Lab 10/31/2019 2146 10/15/19 0059 10/16/19 0714 10/17/19 0728 10/21/19 0313  WBC 7.5 8.6 11.7* 10.1 13.9*  HGB 10.3* 10.2* 10.1* 11.1* 11.3*  HCT 31.9* 32.3* 32.7* 35.6* 35.9*  MCV 91.9 91.2 92.6 91.5 90.4  PLT 130* 134* 136* 138* 073    Basic Metabolic Panel: Recent Labs  Lab 10/15/19 0059 10/15/19 0113 10/16/19 0714 10/16/19 0714 10/16/19 1659 10/17/19 0728 10/19/19 0027 10/20/19 0357 10/20/19 1642 10/21/19 0313  NA   < >  --  141   < >  --  145 142 140 143 144  K   < >  --  3.3*   < >  --   3.4* 3.1* 3.8 2.9* 3.1*  CL   < >  --  107   < >  --  109 106 105 102 104  CO2   < >  --  26   < >  --  _0 GLUCOSE   < >  --  139*   < >  --  140* 142* 174* 164* 185*  BUN   < >  --  13   < >  --  20 27* 26* 21 24*  CREATININE   < >  --  0.94   < >  --  0.93 0.84 0.75 0.78 0.81  CALCIUM   < >  --  8.3*   < >  --  8.9 8.7* 8.6* 8.4* 8.0*  MG   < > 1.8 1.6*  --  1.9 1.8  --   --  1.5* 2.4  PHOS  --  2.5 2.9  --  2.5 1.9*  --   --   --  3.8   < > = values in this interval not displayed.   GFR: Estimated Creatinine Clearance:  60.5 mL/min (by C-G formula based on SCr of 0.81 mg/dL). Recent Labs  Lab 10/15/19 0059 10/16/19 0714 10/17/19 0728 10/21/19 0313  WBC 8.6 11.7* 10.1 13.9*    Liver Function Tests: Recent Labs  Lab 10/21/19 0313  AST 22  ALT 28  ALKPHOS 90  BILITOT 1.3*  PROT 6.1*  ALBUMIN 2.1*   No results for input(s): LIPASE, AMYLASE in the last 168 hours. No results for input(s): AMMONIA in the last 168 hours.  ABG    Component Value Date/Time   PHART 7.387 10/30/2019 2050   PCO2ART 27.6 (L) 10/15/2019 2050   PO2ART 76.6 (L) 10/17/2019 2050   HCO3 16.4 (L) 10/13/2019 2050   TCO2 21 (L) 10/23/2019 1515   ACIDBASEDEF 7.8 (H) 10/31/2019 2050   O2SAT 96.2 10/19/2019 2050     Coagulation Profile: No results for input(s): INR, PROTIME in the last 168 hours.  Cardiac Enzymes: No results for input(s): CKTOTAL, CKMB, CKMBINDEX, TROPONINI in the last 168 hours.  HbA1C: Hgb A1c MFr Bld  Date/Time Value Ref Range Status  10/11/2019 10:00 AM 5.8 (H) 4.8 - 5.6 % Final    Comment:    (NOTE) Pre diabetes:          5.7%-6.4%  Diabetes:              >6.4%  Glycemic control for   <7.0% adults with diabetes     CBG: Recent Labs  Lab 10/20/19 1603 10/20/19 2039 10/21/19 0028 10/21/19 0453 10/21/19 0703  GLUCAP 159* 187* 178* 164* 147*    Review of Systems:   Negative except as noted above.   Past Medical History  She,  has a past  medical history of Asthma, Cancer ([place]), COPD (chronic obstructive pulmonary disease) ([place]), Coronary artery disease, Depression, Diabetes mellitus without complication ([place]), Diastolic dysfunction, Diverticulosis, Fatty liver, GERD without esophagitis, Clostridium difficile infection, colonic polyps, small bowel obstruction, Hypertension, benign, Hypothyroidism, Irritable bowel syndrome, Left bundle branch block, Lumbar herniated disc, Mixed hyperlipidemia, Osteoarthritis, PAF (paroxysmal atrial fibrillation) ([place]), Pneumonia, and Sleep apnea.   Surgical History    Past Surgical History:  Procedure Laterality Date  . ABDOMINAL HYSTERECTOMY  2002  . APPENDECTOMY  2005  . CARDIAC CATHETERIZATION    . CARPAL TUNNEL RELEASE Bilateral   . INTERCOSTAL NERVE BLOCK Right 11/04/2019   Procedure: INTERCOSTAL NERVE BLOCK;  Surgeon: Lajuana Matte, MD;  Location: [facility];  Service: Thoracic;  Laterality: Right;  . JEJUNOSTOMY N/A 10/17/2019   Procedure: JEJUNOSTOMY PLACEMENT;  Surgeon: Lajuana Matte, MD;  Location: [facility];  Service: Thoracic;  Laterality: N/A;  . LEFT HEART CATH AND CORONARY ANGIOGRAPHY N/A 12/28/2018   Procedure: LEFT HEART CATH AND CORONARY ANGIOGRAPHY;  Surgeon: Belva Crome, MD;  Location: [facility];  Service: Cardiovascular;  Laterality: N/A;  . SMALL INTESTINE SURGERY  11/26/2012   Bowel obstruction, Dr. Pauletta Browns  . TUBAL LIGATION  1980  . VIDEO BRONCHOSCOPY N/A 10/12/2019   Procedure: VIDEO BRONCHOSCOPY;  Surgeon: Lajuana Matte, MD;  Location: [facility];  Service: Thoracic;  Laterality: N/A;     Social History   reports that she quit smoking about 6 months ago. She smoked 1.00 pack per day. She has never used smokeless tobacco. She reports previous alcohol use. She reports previous drug use.   Family History   Her family history includes Colon cancer in her daughter; Lung cancer in her brother; Ovarian cancer in her sister; Stomach cancer in her maternal  grandmother.  Allergies Allergies  Allergen Reactions  . Honey Bee Venom Protein [Bee Venom] Anaphylaxis  . Ether Nausea And Vomiting  . Nickel Other (See Comments)    infection  . Other Other (See Comments)    Pt reports that she cannot have staples placed due to infection.  . Tape Rash    Patient states that she has an allergic reaction to certain tapes. She states that she can tolerate paper tape.     Home Medications  Prior to Admission medications   Medication Sig Start Date End Date Taking? Authorizing Provider  Amino Acids-Protein Hydrolys (FEEDING SUPPLEMENT, PRO-STAT SUGAR FREE 64,) LIQD Place 30 mLs into feeding tube 2 (two) times daily. Patient taking differently: Place 30 mLs into feeding tube in the morning, at noon, in the evening, and at bedtime.  06/14/19  Yes Lavina Hamman, MD  aspirin EC 81 MG tablet Take 1 tablet (81 mg total) by mouth daily. 12/22/18  Yes Tobb, Kardie, DO  BREO ELLIPTA 100-25 MCG/INH AEPB Inhale 1 puff into the lungs at bedtime.  08/18/18  Yes [provider]  Calcium Carbonate-Vitamin D (CALCIUM 600/VITAMIN D PO) Take 1 tablet by mouth daily.   Yes [provider]  EPINEPHrine 0.3 mg/0.3 mL IJ SOAJ injection Inject 0.3 mg into the muscle as needed for anaphylaxis. 11/14/18  Yes [provider]  FLUoxetine (PROZAC) 20 MG capsule Take 20 mg by mouth daily. Pt currently not taking, as she feels she doesn't needs them at this time.   Yes [provider]  gabapentin (NEURONTIN) 300 MG capsule Take 300 mg by mouth 2 (two) times daily.  08/19/18  Yes [provider]  isosorbide mononitrate (IMDUR) 30 MG 24 hr tablet TAKE 1 TABLET BY MOUTH  DAILY Patient taking differently: Take 30 mg by mouth daily.  08/26/19  Yes Tobb, Kardie, DO  levothyroxine (SYNTHROID) 75 MCG tablet Take 75 mcg by mouth daily before breakfast.  08/23/18  Yes [provider]  metoprolol tartrate (LOPRESSOR) 25 MG tablet Take 0.5 tablets  (12.5 mg total) by mouth 2 (two) times daily. 07/12/19 10/26/2019 Yes Tobb, Kardie, DO  mometasone (NASONEX) 50 MCG/ACT nasal spray Place 2 sprays into the nose daily as needed (pain).    Yes [provider]  nitroGLYCERIN (NITROSTAT) 0.4 MG SL tablet Place 1 tablet (0.4 mg total) under the tongue every 5 (five) minutes as needed for chest pain. 07/12/19 10/10/19 Yes Tobb, Kardie, DO  oxyCODONE (OXY IR/ROXICODONE) 5 MG immediate release tablet Take 10 mg by mouth every 4 (four) hours as needed for moderate pain.  09/27/19  Yes [provider]  pantoprazole (PROTONIX) 40 MG tablet Take 40 mg by mouth daily.  10/14/18  Yes [provider]  potassium chloride 20 MEQ/15ML (10%) SOLN Take 20 mEq by mouth daily. 06/22/19  Yes [provider]  promethazine (PHENERGAN) 25 MG tablet Take 25 mg by mouth every 8 (eight) hours as needed for nausea or vomiting.  06/28/19  Yes [provider]  rosuvastatin (CRESTOR) 40 MG tablet TAKE 1 TABLET BY MOUTH  DAILY Patient taking differently: Take 40 mg by mouth daily.  08/26/19  Yes Tobb, Kardie, DO  Water For Irrigation, Sterile (FREE WATER) SOLN Place 200 mLs into feeding tube every 8 (eight) hours. 06/14/19  Yes Lavina Hamman, MD  Ascorbic Acid (VITAMIN C) 100 MG tablet Take 100 mg by mouth daily. Patient not taking: Reported on 10/03/2019    [provider]  oxyCODONE-acetaminophen (PERCOCET)  10-325 MG tablet Take 1 tablet by mouth every 4 (four) hours as needed for pain. Patient not taking: Reported on 10/03/2019    [provider]  rifaximin (XIFAXAN) 550 MG TABS tablet Take 550 mg by mouth See admin instructions. Take 1 tablet (550 mg) by mouth twice daily for 2 weeks, hold for 4 months then resume cycle Patient not taking: Reported on 10/03/2019    [provider]  sucralfate (CARAFATE) 1 GM/10ML suspension Take 10 mLs (1 g total) by mouth 4 (four) times daily -  with meals and at bedtime. Patient not  taking: Reported on 10/03/2019 06/14/19   Lavina Hamman, MD     Dr. Jose Persia Internal Medicine PGY-2  Pager: [phone]  10/21/2019, 7:33 AM

## 2019-10-21 NOTE — Progress Notes (Signed)
CPT held at this time. Patient in sterile procedure.

## 2019-10-21 NOTE — Progress Notes (Signed)
eLink called regarding patients reported inability to tolerate the BiPAP. Despite RT decreasing the pressure and administering PRN breathing treatments at the patients request the patient states that she can not tolerate the BiPAP. It was attempted to allow the patient to have a break from the BiPAP with O2 sats immediately dropping to ~ 80%. Patient was placed back on BiPAP and O2 sats improved. Patient remains coarse with complaints of "beng very congested".  Awaiting orders or further instruction at this time.

## 2019-10-21 NOTE — Progress Notes (Signed)
Peripherally Inserted Central Catheter Placement  The IV Nurse has discussed with the patient and/or persons authorized to consent for the patient, the purpose of this procedure and the potential benefits and risks involved with this procedure.  The benefits include less needle sticks, lab draws from the catheter, and the patient may be discharged home with the catheter. Risks include, but not limited to, infection, bleeding, blood clot (thrombus formation), and puncture of an artery; nerve damage and irregular heartbeat and possibility to perform a PICC exchange if needed/ordered by physician.  Alternatives to this procedure were also discussed.  Bard Power PICC patient education guide, fact sheet on infection prevention and patient information card has been provided to patient /or left at bedside.    PICC Placement Documentation  PICC Triple Lumen 48/01/65 Left Basilic 42 cm 0 cm (Active)  Exposed Catheter (cm) 0 cm 10/21/19 1139  Site Assessment Clean;Dry;Intact 10/21/19 1139  Lumen #1 Status Flushed;Saline locked;Blood return noted 10/21/19 1139  Lumen #2 Status Flushed;Blood return noted;Saline locked 10/21/19 1139  Lumen #3 Status Flushed;Blood return noted;Saline locked 10/21/19 1139  Dressing Type Transparent;Securing device 10/21/19 1139  Dressing Status Clean;Dry;Intact 10/21/19 1139  Antimicrobial disc in place? Yes 10/21/19 1139  Safety Lock Not Applicable 53/74/82 7078  Dressing Change Due 10/28/19 10/21/19 1139       [name] 10/21/2019, 11:41 AM

## 2019-10-21 NOTE — Procedures (Signed)
Arterial Catheter Insertion Procedure Note  [name]  [id number]  1950-02-10  Date:10/21/19  Time:10:26 AM    Provider Performing: Jose Persia    Procedure: Insertion of Arterial Line [phone]) with US guidance (37357)   Indication(s) Blood pressure monitoring and/or need for frequent ABGs  Consent Unable to obtain consent due to emergent nature of procedure.  Anesthesia None   Time Out Verified patient identification, verified procedure, site/side was marked, verified correct patient position, special equipment/implants available, medications/allergies/relevant history reviewed, required imaging and test results available.   Sterile Technique Maximal sterile technique including full sterile barrier drape, hand hygiene, sterile gown, sterile gloves, mask, hair covering, sterile ultrasound probe cover (if used).   Procedure Description Area of catheter insertion was cleaned with chlorhexidine and draped in sterile fashion. With real-time ultrasound guidance, an arterial catheter placement was attempted on the left radial artery without success. With real-time ultrasound guidance an arterial catheter was placed into the right radial artery successfully.  Appropriate arterial tracings confirmed on monitor.     Complications/Tolerance None; patient tolerated the procedure well.   EBL Minimal   Specimen(s) None

## 2019-10-21 NOTE — Progress Notes (Signed)
     [address]       [place] [address]             [phone]       Neck incision opened at the bedside Fibrinous exudate  Will continue daily wet to dry dressing changes

## 2019-10-21 NOTE — Progress Notes (Signed)
eLink Physician-Brief Progress Note Patient Name: [name] DOB: [birth year] MRN: [id number]   Date of Service  10/21/2019  HPI/Events of Note  BP 111/65  eICU Interventions  Precedex infusion started with a ceiling  of 0.6 mcg.        Kerry Kass [name] 10/21/2019, 4:47 AM

## 2019-10-21 NOTE — Procedures (Signed)
Intubation Procedure Note  MARCELENE WEIDEMANN  [id number]  17-Sep-1950  Date:10/21/19  Time:8:17 AM   Provider Performing:Arihana Ambrocio Charleen Kirks    Procedure: Intubation (65681)  Indication(s) Respiratory Failure  Consent Risks of the procedure as well as the alternatives and risks of each were explained to the patient and/or caregiver.  Consent for the procedure was obtained and is signed in the bedside chart   Anesthesia Etomidate and Rocuronium   Time Out Verified patient identification, verified procedure, site/side was marked, verified correct patient position, special equipment/implants available, medications/allergies/relevant history reviewed, required imaging and test results available.   Sterile Technique Usual hand hygeine, masks, and gloves were used   Procedure Description Patient positioned in bed supine.  Sedation given as noted above.  Patient was intubated with endotracheal tube using Glidescope.  View was Grade 1 full glottis .  Number of attempts was 1.  Colorimetric CO2 detector was consistent with tracheal placement.   Complications/Tolerance None; patient tolerated the procedure well. Chest X-ray is ordered to verify placement.   EBL Minimal   Specimen(s) None

## 2019-10-22 DIAGNOSIS — J9601 Acute respiratory failure with hypoxia: Secondary | ICD-10-CM | POA: Diagnosis not present

## 2019-10-22 LAB — GLUCOSE, CAPILLARY
Glucose-Capillary: 153 mg/dL — ABNORMAL HIGH (ref 70–99)
Glucose-Capillary: 155 mg/dL — ABNORMAL HIGH (ref 70–99)
Glucose-Capillary: 156 mg/dL — ABNORMAL HIGH (ref 70–99)
Glucose-Capillary: 164 mg/dL — ABNORMAL HIGH (ref 70–99)
Glucose-Capillary: 171 mg/dL — ABNORMAL HIGH (ref 70–99)
Glucose-Capillary: 172 mg/dL — ABNORMAL HIGH (ref 70–99)
Glucose-Capillary: 187 mg/dL — ABNORMAL HIGH (ref 70–99)

## 2019-10-22 LAB — CBC
HCT: 32.7 % — ABNORMAL LOW (ref 36.0–46.0)
Hemoglobin: 10.4 g/dL — ABNORMAL LOW (ref 12.0–15.0)
MCH: 29.4 pg (ref 26.0–34.0)
MCHC: 31.8 g/dL (ref 30.0–36.0)
MCV: 92.4 fL (ref 80.0–100.0)
Platelets: 296 10*3/uL (ref 150–400)
RBC: 3.54 MIL/uL — ABNORMAL LOW (ref 3.87–5.11)
RDW: 14.6 % (ref 11.5–15.5)
WBC: 19.6 10*3/uL — ABNORMAL HIGH (ref 4.0–10.5)
nRBC: 0 % (ref 0.0–0.2)

## 2019-10-22 LAB — COMPREHENSIVE METABOLIC PANEL
ALT: 24 U/L (ref 0–44)
AST: 21 U/L (ref 15–41)
Albumin: 1.9 g/dL — ABNORMAL LOW (ref 3.5–5.0)
Alkaline Phosphatase: 86 U/L (ref 38–126)
Anion gap: 14 (ref 5–15)
BUN: 31 mg/dL — ABNORMAL HIGH (ref 8–23)
CO2: 25 mmol/L (ref 22–32)
Calcium: 8.1 mg/dL — ABNORMAL LOW (ref 8.9–10.3)
Chloride: 104 mmol/L (ref 98–111)
Creatinine, Ser: 1.2 mg/dL — ABNORMAL HIGH (ref 0.44–1.00)
GFR, Estimated: 46 mL/min — ABNORMAL LOW (ref >60)
Glucose, Bld: 188 mg/dL — ABNORMAL HIGH (ref 70–99)
Potassium: 3.3 mmol/L — ABNORMAL LOW (ref 3.5–5.1)
Sodium: 143 mmol/L (ref 135–145)
Total Bilirubin: 1.1 mg/dL (ref 0.3–1.2)
Total Protein: 5.9 g/dL — ABNORMAL LOW (ref 6.5–8.1)

## 2019-10-22 LAB — PHOSPHORUS: Phosphorus: 4.2 mg/dL (ref 2.5–4.6)

## 2019-10-22 LAB — MAGNESIUM: Magnesium: 1.7 mg/dL (ref 1.7–2.4)

## 2019-10-22 LAB — TRIGLYCERIDES: Triglycerides: 312 mg/dL — ABNORMAL HIGH (ref <150)

## 2019-10-22 MED ORDER — ASPIRIN EC 81 MG PO TBEC: 81.0000 mg | DELAYED_RELEASE_TABLET | Freq: Every day | ORAL | Status: DC

## 2019-10-22 MED ORDER — IPRATROPIUM-ALBUTEROL 0.5-2.5 (3) MG/3ML IN SOLN
3.0000 mL | RESPIRATORY_TRACT | Status: DC | PRN
  Administered 2019-11-08: 3 mL via RESPIRATORY_TRACT
  Filled 2019-10-22: qty 3

## 2019-10-22 MED ORDER — AMIODARONE HCL IN DEXTROSE 360-4.14 MG/200ML-% IV SOLN
60.0000 mg/h | INTRAVENOUS | Status: AC
  Administered 2019-10-22 (×2): 60 mg/h via INTRAVENOUS
  Filled 2019-10-22 (×2): qty 200

## 2019-10-22 MED ORDER — ASPIRIN 81 MG PO CHEW
81.0000 mg | CHEWABLE_TABLET | Freq: Every day | ORAL | Status: DC
  Administered 2019-10-22 – 2019-11-17 (×24): 81 mg
  Filled 2019-10-22 (×27): qty 1

## 2019-10-22 MED ORDER — AMIODARONE HCL IN DEXTROSE 360-4.14 MG/200ML-% IV SOLN
30.0000 mg/h | INTRAVENOUS | Status: DC
  Administered 2019-10-22 – 2019-11-03 (×24): 30 mg/h via INTRAVENOUS
  Filled 2019-10-22 (×25): qty 200

## 2019-10-22 MED ORDER — AMIODARONE LOAD VIA INFUSION
150.0000 mg | Freq: Once | INTRAVENOUS | Status: AC
  Administered 2019-10-22: 150 mg via INTRAVENOUS
  Filled 2019-10-22: qty 83.34

## 2019-10-22 MED ORDER — MAGNESIUM SULFATE 2 GM/50ML IV SOLN
2.0000 g | Freq: Once | INTRAVENOUS | Status: AC
  Administered 2019-10-22: 2 g via INTRAVENOUS
  Filled 2019-10-22: qty 50

## 2019-10-22 MED ORDER — ENOXAPARIN SODIUM 80 MG/0.8ML ~~LOC~~ SOLN
80.0000 mg | Freq: Two times a day (BID) | SUBCUTANEOUS | Status: DC
  Administered 2019-10-22 – 2019-10-23 (×3): 80 mg via SUBCUTANEOUS
  Filled 2019-10-22 (×3): qty 0.8

## 2019-10-22 MED ORDER — FENTANYL CITRATE (PF) 100 MCG/2ML IJ SOLN
25.0000 ug | Freq: Once | INTRAMUSCULAR | Status: DC
  Filled 2019-10-22: qty 2

## 2019-10-22 MED ORDER — POTASSIUM CHLORIDE 20 MEQ/15ML (10%) PO SOLN
40.0000 meq | Freq: Four times a day (QID) | ORAL | Status: AC
  Administered 2019-10-22 (×2): 40 meq via ORAL
  Filled 2019-10-22 (×2): qty 30

## 2019-10-22 MED ORDER — FENTANYL 2500MCG IN NS 250ML (10MCG/ML) PREMIX INFUSION
25.0000 ug/h | INTRAVENOUS | Status: DC
  Administered 2019-10-22: 75 ug/h via INTRAVENOUS
  Administered 2019-10-23: 125 ug/h via INTRAVENOUS
  Filled 2019-10-22 (×2): qty 250

## 2019-10-22 MED ORDER — FENTANYL BOLUS VIA INFUSION
25.0000 ug | INTRAVENOUS | Status: DC | PRN
  Administered 2019-10-22 (×3): 25 ug via INTRAVENOUS
  Filled 2019-10-22: qty 25

## 2019-10-22 MED ORDER — POTASSIUM CHLORIDE 10 MEQ/50ML IV SOLN
10.0000 meq | INTRAVENOUS | Status: AC
  Administered 2019-10-22 (×3): 10 meq via INTRAVENOUS
  Filled 2019-10-22 (×3): qty 50

## 2019-10-22 NOTE — Progress Notes (Addendum)
MD Smith rounding bedside, patients heart rate fluctuating 120-150's and appears to be A-fib RVR.  Patient has history of A-fib.

## 2019-10-22 NOTE — Progress Notes (Signed)
[address]       [place] [address]             [phone]                 8 Days Post-Op Procedure(s) (LRB): XI ROBOTIC ASSISTED [place] ESOPHAGECTOMY USING NIMS (N/A) VIDEO BRONCHOSCOPY (N/A) JEJUNOSTOMY PLACEMENT (N/A) INTERCOSTAL NERVE BLOCK (Right)   Events: afib overnight On levo today A-line non-functional _______________________________________________________________ Vitals: BP 98/85   Pulse (!) 114   Temp 98.8 F (37.1 C)   Resp (!) 28   Ht 5\' 2"  (1.575 m)   Wt 73.5 kg   SpO2 94%   BMI 29.64 kg/m   - Neuro: alert NAD  - Cardiovascular: afib  Drips: levo 15, and amio.   CVP:  [3 mmHg-9 mmHg] 9 mmHg  - Pulm:  Vent Mode: PRVC FiO2 (%):  [40 %-80 %] 40 % Set Rate:  [24 bmp] 24 bmp Vt Set:  [400 mL] 400 mL PEEP:  [5 cmH20] 5 cmH20 Plateau Pressure:  [26 cmH20] 26 cmH20  ABG    Component Value Date/Time   PHART 7.368 10/21/2019 1004   PCO2ART 50.5 (H) 10/21/2019 1004   PO2ART 99 10/21/2019 1004   HCO3 29.2 (H) 10/21/2019 1004   TCO2 31 10/21/2019 1004   ACIDBASEDEF 7.8 (H) 10/31/2019 2050   O2SAT 97.0 10/21/2019 1004    - Abd: soft.  J tube in place - Extremity: warm  .Intake/Output      10/15 0701 - 10/16 0700 10/16 0701 - 10/17 0700   I.V. (mL/kg) 1240.3 (16.9) 321.8 (4.4)   Other 30 150   NG/GT 1074.6    IV Piggyback 508.1    Total Intake(mL/kg) 2853 (38.8) 471.8 (6.4)   Urine (mL/kg/hr) 1150 (0.7) 150 (0.4)   Drains 30    Stool 0 0   Chest Tube 0 10   Total Output 1180 160   Net +1673 +311.8        Urine Occurrence  1 x   Stool Occurrence 3 x 1 x      _______________________________________________________________ Labs: CBC Latest Ref Rng & Units 10/22/2019 10/21/2019 10/21/2019  WBC 4.0 - 10.5 K/uL 19.6(H) - 13.9(H)  Hemoglobin 12.0 - 15.0 g/dL 10.4(L) 11.2(L) 11.3(L)  Hematocrit 36 - 46 % 32.7(L) 33.0(L) 35.9(L)  Platelets 150 - 400 K/uL 296 - 241   CMP Latest Ref Rng & Units 10/22/2019  10/21/2019 10/21/2019  Glucose 70 - 99 mg/dL 188(H) 163(H) -  BUN 8 - 23 mg/dL 31(H) 31(H) -  Creatinine 0.44 - 1.00 mg/dL 1.20(H) 1.12(H) -  Sodium 135 - 145 mmol/L 143 142 142  Potassium 3.5 - 5.1 mmol/L 3.3(L) 3.5 4.7  Chloride 98 - 111 mmol/L 104 103 -  CO2 22 - 32 mmol/L 25 25 -  Calcium 8.9 - 10.3 mg/dL 8.1(L) 8.2(L) -  Total Protein 6.5 - 8.1 g/dL 5.9(L) - -  Total Bilirubin 0.3 - 1.2 mg/dL 1.1 - -  Alkaline Phos 38 - 126 U/L 86 - -  AST 15 - 41 U/L 21 - -  ALT 0 - 44 U/L 24 - -    CXR: pending  _______________________________________________________________  Assessment and Plan: POD 8 s/p Robotic 3 field esophagectomy.  Respiratory failure, possible cervical anastamotic leak  Neuro: on propofol, and fentanyl CV: afib on amio Pulm: continue pulm toilet.  Wean vent as tolerated Renal: stable GI: receiving J tube feeds Heme: stable ID: wet to dry  dressing to cervical incision.  Continue abx of asp pneumonia Endo: SSI  Dispo: continue ICU care  Melodie Bouillon, MD 10/22/2019 11:44 AM

## 2019-10-22 NOTE — Progress Notes (Signed)
NAME:  [name], MRN:  [id number], DOB:  04-01-1950, LOS: 8 ADMISSION DATE:  10/20/2019, CONSULTATION DATE: 10/20/2019 REFERRING MD:  Dr. Kipp Brood, CHIEF COMPLAINT:  SOB   Brief History   [name]. Cardon is a 69 y/o female with a PMHx of esophageal squamous cell carcinoma admitted on 10/08 for esophagectomy with interval development of respiratory distress requiring BiPAP and transfer to ICU.   History of present illness   [name] presented to [facility] for admission for elective esophagectomy on 10/08 that proceeded without any complications. She is currently POD 6.   Overnight, patient developed worsening work of breathing requiring NRB. She was noted to be diaphoretic with decreased lung sounds by the RN. Rapid response was called, who switched chest tube from water seal to suction. Air leak noted in [place]. CXR obtained overnight showed small right basilar pneumothorax with loculated pleural effusion, as well as interval development of extensive airspace infiltrate in the LUL. Repeat CXR this morning shows improvement in pneumothorax with no change in opacities. In the late morning, patient developed worsening respiratory distress requiring BiPAP placement and transfer to ICU for close monitoring.  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 Fib,   Significant Hospital Events   10/08 >> Admitted to [facility] 10/14 >> Transferred to ICU for respiratory distress   Consults:  PCCM  Procedures:  10/08 >> Esophagectomy 10/15 >> ETT placed, Bronchoscopy     Significant Diagnostic Tests:  CXR 10/13 >> Small right basilar pneumothorax, however, has increased in size since prior examination and there has now developed a small amount of laterally loculated pleural fluid. Extensive airspace infiltrate has now developed throughout the left upper lobe, likely infectious in the appropriate clinical setting.   CXR 10/14  >> Right basilar  pneumothorax is smaller compared to earlier in the day. No tension component. Subcutaneous air present on the right. There is extensive airspace opacity throughout the left upper lobe and left perihilar regions, similar to earlier in the day. There is patchy atelectasis in the right mid lung and right base with suspected loculated pleural effusion on the right laterally  Micro Data:  None  Antimicrobials:  Zosyn 10/14 >>   Interim history/subjective:   No events, more comfortable on vent. I+D by Dr. Kipp Brood of L neck incision site last night. Intermittent afib/RVR On levophed, propofol  Objective   Blood pressure (!) 112/99, pulse (!) 123, temperature 98.5 F (36.9 C), resp. rate (!) 25, height 5\' 2"  (1.575 m), weight 73.5 kg, SpO2 97 %. CVP:  [3 mmHg-9 mmHg] 9 mmHg  Vent Mode: PRVC FiO2 (%):  [40 %-100 %] 40 % Set Rate:  [24 bmp] 24 bmp Vt Set:  [400 mL] 400 mL PEEP:  [5 cmH20] 5 cmH20 Plateau Pressure:  [10 cmH20-26 cmH20] 26 cmH20   Intake/Output Summary (Last 24 hours) at 10/22/2019 0824 Last data filed at 10/22/2019 0700 Gross per 24 hour  Intake 2534.74 ml  Output 1150 ml  Net 1384.74 ml   Filed Weights   10/20/19 1103 10/21/19 0500 10/22/19 0424  Weight: 76.9 kg 73.1 kg 73.5 kg   Examination: General: Frail, ill-appearing female HENT: Mucus membranes dry. Lungs: Intubated.  Rhonci bilaterally, triggering vent Cardiovascular: Irregular tachycardic rhythm with rates in the 150s. No murmurs Abdomen: Soft. Hypoactive bowel sounds.  Extremities: No pitting edema.  Neuro: Moves ext to command RASS 0, c/o neck pain Skin: Incision on left lateral neck dressed without  strikethrough  Na/BUN/Cr up Albumin low Weight now same as admission suggesting euvolemia  Resolved Hospital Problem list   N/A  Assessment & Plan:  # Acute Hypoxic Respiratory Failure  # Aspiration Pneumonia  # Question volume overloaded state of heart # Pneumothorax (R): Resolved with tube  thoracostomy # Hx of Asthma  - Switch nebs to PRN duoneb - Chest tube to suction - Increase sedation for now - Allow L lung aspiration pneumonitis to heal prior to extubation trial - continue zosyn - Diuretic holiday  # Sinus Tachycardia # Hx of Atrial Fibrillation  - Intermittent, start amio - Increase AC to full dose  # Esophageal Squamous Cell Carcinoma s/p Esophagectomy  - Management of incision site per TCTS  - f/u incision fluid culture  # Non-obstructive CAD # LAD saccular aneurysm  # HTN  - Home Plavix on hold - Continue aspirin - Home Imdur on hold  - Discontinue home metoprolol given need for Levo post-intubation   # Hx of Hypothyroidism  - Continue daily Levothyroxine  Best practice:  Diet: Tube Feeds per G Tube  Pain/Anxiety/Delirium protocol (if indicated): Propofol + Fentanyl  VAP protocol (if indicated): Ordered  DVT prophylaxis: full dose lovenox GI prophylaxis: Protonix Glucose control: SSI Mobility: Bedrest Code Status: FULL Family Communication: Per Primary Disposition: ICU    The patient is critically ill with multiple organ systems failure and requires high complexity decision making for assessment and support, frequent evaluation and titration of therapies, application of advanced monitoring technologies and extensive interpretation of multiple databases. Critical Care Time devoted to patient care services described in this note independent of APP/resident time (if applicable)  is 35 minutes.   Erskine Emery MD [facility] 10/22/2019 10:16 AM Personal pager: [phone] If unanswered, please page CCM On-call: [phone]

## 2019-10-23 DIAGNOSIS — J9601 Acute respiratory failure with hypoxia: Secondary | ICD-10-CM | POA: Diagnosis not present

## 2019-10-23 LAB — CBC
HCT: 30.7 % — ABNORMAL LOW (ref 36.0–46.0)
Hemoglobin: 9.7 g/dL — ABNORMAL LOW (ref 12.0–15.0)
MCH: 29.7 pg (ref 26.0–34.0)
MCHC: 31.6 g/dL (ref 30.0–36.0)
MCV: 93.9 fL (ref 80.0–100.0)
Platelets: 272 10*3/uL (ref 150–400)
RBC: 3.27 MIL/uL — ABNORMAL LOW (ref 3.87–5.11)
RDW: 14.6 % (ref 11.5–15.5)
WBC: 21.8 10*3/uL — ABNORMAL HIGH (ref 4.0–10.5)
nRBC: 0 % (ref 0.0–0.2)

## 2019-10-23 LAB — COMPREHENSIVE METABOLIC PANEL
ALT: 18 U/L (ref 0–44)
AST: 19 U/L (ref 15–41)
Albumin: 1.6 g/dL — ABNORMAL LOW (ref 3.5–5.0)
Alkaline Phosphatase: 72 U/L (ref 38–126)
Anion gap: 11 (ref 5–15)
BUN: 34 mg/dL — ABNORMAL HIGH (ref 8–23)
CO2: 26 mmol/L (ref 22–32)
Calcium: 7.6 mg/dL — ABNORMAL LOW (ref 8.9–10.3)
Chloride: 103 mmol/L (ref 98–111)
Creatinine, Ser: 1.11 mg/dL — ABNORMAL HIGH (ref 0.44–1.00)
GFR, Estimated: 50 mL/min — ABNORMAL LOW (ref >60)
Glucose, Bld: 223 mg/dL — ABNORMAL HIGH (ref 70–99)
Potassium: 4 mmol/L (ref 3.5–5.1)
Sodium: 140 mmol/L (ref 135–145)
Total Bilirubin: 0.9 mg/dL (ref 0.3–1.2)
Total Protein: 5.1 g/dL — ABNORMAL LOW (ref 6.5–8.1)

## 2019-10-23 LAB — GLUCOSE, CAPILLARY
Glucose-Capillary: 170 mg/dL — ABNORMAL HIGH (ref 70–99)
Glucose-Capillary: 155 mg/dL — ABNORMAL HIGH (ref 70–99)
Glucose-Capillary: 152 mg/dL — ABNORMAL HIGH (ref 70–99)
Glucose-Capillary: 125 mg/dL — ABNORMAL HIGH (ref 70–99)
Glucose-Capillary: 129 mg/dL — ABNORMAL HIGH (ref 70–99)

## 2019-10-23 LAB — MAGNESIUM: Magnesium: 2.1 mg/dL (ref 1.7–2.4)

## 2019-10-23 LAB — TRIGLYCERIDES: Triglycerides: 703 mg/dL — ABNORMAL HIGH (ref <150)

## 2019-10-23 LAB — PHOSPHORUS: Phosphorus: 4.2 mg/dL (ref 2.5–4.6)

## 2019-10-23 MED ORDER — FENTANYL CITRATE (PF) 100 MCG/2ML IJ SOLN
25.0000 ug | INTRAMUSCULAR | Status: DC | PRN
  Administered 2019-10-23 – 2019-10-24 (×5): 25 ug via INTRAVENOUS
  Administered 2019-11-08: 100 ug via INTRAVENOUS
  Filled 2019-10-23 (×4): qty 2

## 2019-10-23 MED ORDER — OXYCODONE HCL 5 MG PO TABS
5.0000 mg | ORAL_TABLET | ORAL | Status: DC | PRN
  Administered 2019-10-23 – 2019-10-29 (×4): 5 mg via ORAL
  Filled 2019-10-23 (×4): qty 1

## 2019-10-23 MED ORDER — ALBUMIN HUMAN 25 % IV SOLN
25.0000 g | Freq: Four times a day (QID) | INTRAVENOUS | Status: AC
  Administered 2019-10-23 – 2019-10-24 (×4): 25 g via INTRAVENOUS
  Filled 2019-10-23 (×4): qty 100

## 2019-10-23 MED ORDER — APIXABAN 5 MG PO TABS
5.0000 mg | ORAL_TABLET | Freq: Two times a day (BID) | ORAL | Status: DC
  Administered 2019-10-23: 5 mg via JEJUNOSTOMY
  Filled 2019-10-23: qty 1

## 2019-10-23 NOTE — Procedures (Signed)
Extubation Procedure Note  Patient Details:   Name: [name] DOB: 12/16/1950 MRN: [id number]   Airway Documentation:    Vent end date: 10/23/19 Vent end time: 1048   Evaluation  O2 sats: transiently fell during during procedure Complications: No apparent complications Patient did tolerate procedure well. Bilateral Breath Sounds: Clear, Diminished   Yes   Positive cuff leak noted. Patient placed on [place] 4 L with humidity, O2 sats 82%.  No Stridor noted. Patient switched to a [place] HFNC 12L with humidity, sats improved to 91%.  Patient able to reach 375 using the incentive spirometer.  [name] 10/23/2019, 11:25 AM

## 2019-10-23 NOTE — Progress Notes (Signed)
NAME:  [name], MRN:  [id number], DOB:  09/29/1950, LOS: 9 ADMISSION DATE:  11/04/2019, CONSULTATION DATE: 10/20/2019 REFERRING MD:  Dr. Kipp Brood, CHIEF COMPLAINT:  SOB   Brief History   Rachel Lore. Flores is a 69 y/o female with a PMHx of esophageal squamous cell carcinoma admitted on 10/08 for esophagectomy with interval development of respiratory distress requiring BiPAP and transfer to ICU.   History of present illness   [name] presented to [facility] for admission for elective esophagectomy on 10/08 that proceeded without any complications. She is currently POD 6.   Overnight, patient developed worsening work of breathing requiring NRB. She was noted to be diaphoretic with decreased lung sounds by the RN. Rapid response was called, who switched chest tube from water seal to suction. Air leak noted in [place]. CXR obtained overnight showed small right basilar pneumothorax with loculated pleural effusion, as well as interval development of extensive airspace infiltrate in the LUL. Repeat CXR this morning shows improvement in pneumothorax with no change in opacities. In the late morning, patient developed worsening respiratory distress requiring BiPAP placement and transfer to ICU for close monitoring.  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 Fib,   Significant Hospital Events   10/08 >> Admitted to [facility] 10/14 >> Transferred to ICU for respiratory distress   Consults:  PCCM  Procedures:  10/08 >> Esophagectomy 10/15 >> ETT placed, Bronchoscopy     Significant Diagnostic Tests:  CXR 10/13 >> Small right basilar pneumothorax, however, has increased in size since prior examination and there has now developed a small amount of laterally loculated pleural fluid. Extensive airspace infiltrate has now developed throughout the left upper lobe, likely infectious in the appropriate clinical setting.   CXR 10/14  >> Right basilar  pneumothorax is smaller compared to earlier in the day. No tension component. Subcutaneous air present on the right. There is extensive airspace opacity throughout the left upper lobe and left perihilar regions, similar to earlier in the day. There is patchy atelectasis in the right mid lung and right base with suspected loculated pleural effusion on the right laterally  Micro Data:  None  Antimicrobials:  Zosyn 10/14 >>   Interim history/subjective:   No events Looks better On PS Still has fair O2 need (FiO2 50%)  Objective   Blood pressure (!) 114/56, pulse (!) 108, temperature 99.5 F (37.5 C), temperature source Oral, resp. rate 13, height 5\' 2"  (1.575 m), weight 74.6 kg, SpO2 91 %.    Vent Mode: PRVC FiO2 (%):  [40 %-50 %] 50 % Set Rate:  [24 bmp] 24 bmp Vt Set:  [400 mL] 400 mL PEEP:  [5 cmH20] 5 cmH20 Plateau Pressure:  [27 cmH20] 27 cmH20   Intake/Output Summary (Last 24 hours) at 10/23/2019 0930 Last data filed at 10/23/2019 0900 Gross per 24 hour  Intake 3501.62 ml  Output 1020 ml  Net 2481.62 ml   Filed Weights   10/21/19 0500 10/22/19 0424 10/23/19 0347  Weight: 73.1 kg 73.5 kg 74.6 kg   Examination: Elderly woman on ventilator Following commands x4 extremities Endotracheal tube in place with mild secretions Lung sounds diminished bilaterally Left neck dressing in place without strikethrough Right chest tube in place with tan-colored output, no air leak Extremities without edema  Creatinine stable Albumin remains low Triglycerides markedly elevated White count essentially stable H&H stable Glucoses 1 50-1 70 +2.5 L yesterday  Resolved Hospital Problem list  N/A  Assessment & Plan:  # Acute Hypoxic Respiratory Failure  # Aspiration Pneumonia  # Question volume overloaded state of heart # Pneumothorax (R): Resolved with tube thoracostomy # Hx of Asthma  Chest tube management per primary Continue Zosyn, follow-up tracheal aspirate and JP drain  culture May need to reevaluate anastomosis in the next day or 2 Work towards extubation today, discussed with Dr. Kipp Brood Progressive mobility, IS, and physical therapy will be paramount going forward  # Sinus Tachycardia # Hx of Atrial Fibrillation  -Continue full dose Lovenox - Sinus rhythm at present  # Esophageal Squamous Cell Carcinoma s/p Esophagectomy  - Management of incision site per TCTS  - f/u incision fluid culture  # Non-obstructive CAD # LAD saccular aneurysm  # HTN  - Home Plavix on hold - Continue aspirin - Home Imdur on hold  - Resume home metoprolol when Bps allow  # Hx of Hypothyroidism  - Continue daily Levothyroxine  # High TG- should resolve off propofol  Best practice:  Diet: Tube Feeds per G Tube  Pain/Anxiety/Delirium protocol (if indicated): wean off VAP protocol (if indicated): Ordered  DVT prophylaxis: full dose lovenox GI prophylaxis: Protonix Glucose control: SSI Mobility: up to chair Code Status: FULL Family Communication: Per Primary Disposition: ICU    The patient is critically ill with multiple organ systems failure and requires high complexity decision making for assessment and support, frequent evaluation and titration of therapies, application of advanced monitoring technologies and extensive interpretation of multiple databases. Critical Care Time devoted to patient care services described in this note independent of APP/resident time (if applicable)  is 38 minutes.   Erskine Emery MD [facility] 10/23/2019 9:30 AM Personal pager: [phone] If unanswered, please page CCM On-call: [phone]

## 2019-10-23 NOTE — Progress Notes (Signed)
This RN and Programmer, systems, RN wasted 266ml of fentanyl from the d/c fentanyl drip. Medication was wasted in the medication waste jug.

## 2019-10-23 NOTE — Progress Notes (Signed)
[address]       ,[place] [address]             [phone]                 9 Days Post-Op Procedure(s) (LRB): XI ROBOTIC ASSISTED [place]KEOWN ESOPHAGECTOMY USING NIMS (N/A) VIDEO BRONCHOSCOPY (N/A) JEJUNOSTOMY PLACEMENT (N/A) INTERCOSTAL NERVE BLOCK (Right)   Events: No events Extubated this am _______________________________________________________________ Vitals: BP (!) 114/56   Pulse (!) 108   Temp 99.5 F (37.5 C) (Oral)   Resp 13   Ht _0  (1.575 m)   Wt 74.6 kg   SpO2 91%   BMI 30.08 kg/m   - Neuro: alert NAD  - Cardiovascular: afib  Drips: amio.      - Pulm:  Vent Mode: PRVC FiO2 (%):  [40 %-50 %] 50 % Set Rate:  [24 bmp] 24 bmp Vt Set:  [400 mL] 400 mL PEEP:  [5 cmH20] 5 cmH20 Plateau Pressure:  [27 cmH20] 27 cmH20  ABG    Component Value Date/Time   PHART 7.368 10/21/2019 1004   PCO2ART 50.5 (H) 10/21/2019 1004   PO2ART 99 10/21/2019 1004   HCO3 29.2 (H) 10/21/2019 1004   TCO2 31 10/21/2019 1004   ACIDBASEDEF 7.8 (H) 10/15/2019 2050   O2SAT 97.0 10/21/2019 1004    - Abd: soft.  J tube in place - Extremity: warm  .Intake/Output      10/16 0701 - 10/17 0700 10/17 0701 - 10/18 0700   I.V. (mL/kg) 1957.6 (26.2) 141.9 (1.9)   Other 280    NG/GT 990 225   IV Piggyback 285.5 10.8   Total Intake(mL/kg) 3513.1 (47.1) 377.8 (5.1)   Urine (mL/kg/hr) 850 (0.5) 200 (0.7)   Drains     Stool 100 20   Chest Tube 10 0   Total Output 960 220   Net +2553.1 +157.8        Urine Occurrence 2 x    Stool Occurrence 3 x       _______________________________________________________________ Labs: CBC Latest Ref Rng & Units 10/23/2019 10/22/2019 10/21/2019  WBC 4.0 - 10.5 K/uL 21.8(H) 19.6(H) -  Hemoglobin 12.0 - 15.0 g/dL 9.7(L) 10.4(L) 11.2(L)  Hematocrit 36 - 46 % 30.7(L) 32.7(L) 33.0(L)  Platelets 150 - 400 K/uL 272 296 -   CMP Latest Ref Rng & Units 10/23/2019 10/22/2019 10/21/2019  Glucose 70 - 99 mg/dL 223(H) 188(H)  163(H)  BUN 8 - 23 mg/dL 34(H) 31(H) 31(H)  Creatinine 0.44 - 1.00 mg/dL 1.11(H) 1.20(H) 1.12(H)  Sodium 135 - 145 mmol/L 140 143 142  Potassium 3.5 - 5.1 mmol/L 4.0 3.3(L) 3.5  Chloride 98 - 111 mmol/L 103 104 103  CO2 22 - 32 mmol/L _1 Calcium 8.9 - 10.3 mg/dL 7.6(L) 8.1(L) 8.2(L)  Total Protein 6.5 - 8.1 g/dL 5.1(L) 5.9(L) -  Total Bilirubin 0.3 - 1.2 mg/dL 0.9 1.1 -  Alkaline Phos 38 - 126 U/L 72 86 -  AST 15 - 41 U/L 19 21 -  ALT 0 - 44 U/L 18 24 -    CXR: pending  _______________________________________________________________  Assessment and Plan: POD 9 s/p Robotic 3 field esophagectomy.  Respiratory failure, possible cervical anastamotic leak  Neuro: pain controlled CV: afib on amio.  Starting eliquis Pulm: continue pulm toilet.   Renal: stable GI: receiving J tube feeds Heme: stable ID: wet to dry dressing to cervical incision.  Continue abx of asp pneumonia Endo: SSI  Dispo: continue ICU care  Melodie Bouillon, MD 10/23/2019 10:41 AM

## 2019-10-24 ENCOUNTER — Inpatient Hospital Stay (HOSPITAL_COMMUNITY): Payer: Medicare Other

## 2019-10-24 DIAGNOSIS — J9601 Acute respiratory failure with hypoxia: Secondary | ICD-10-CM | POA: Diagnosis not present

## 2019-10-24 LAB — CBC
HCT: 27 % — ABNORMAL LOW (ref 36.0–46.0)
Hemoglobin: 8.1 g/dL — ABNORMAL LOW (ref 12.0–15.0)
MCH: 28.3 pg (ref 26.0–34.0)
MCHC: 30 g/dL (ref 30.0–36.0)
MCV: 94.4 fL (ref 80.0–100.0)
Platelets: 205 10*3/uL (ref 150–400)
RBC: 2.86 MIL/uL — ABNORMAL LOW (ref 3.87–5.11)
RDW: 14.5 % (ref 11.5–15.5)
WBC: 13.6 10*3/uL — ABNORMAL HIGH (ref 4.0–10.5)
nRBC: 0 % (ref 0.0–0.2)

## 2019-10-24 LAB — COMPREHENSIVE METABOLIC PANEL
ALT: 15 U/L (ref 0–44)
AST: 18 U/L (ref 15–41)
Albumin: 2.5 g/dL — ABNORMAL LOW (ref 3.5–5.0)
Alkaline Phosphatase: 54 U/L (ref 38–126)
Anion gap: 11 (ref 5–15)
BUN: 31 mg/dL — ABNORMAL HIGH (ref 8–23)
CO2: 29 mmol/L (ref 22–32)
Calcium: 8.2 mg/dL — ABNORMAL LOW (ref 8.9–10.3)
Chloride: 106 mmol/L (ref 98–111)
Creatinine, Ser: 0.88 mg/dL (ref 0.44–1.00)
GFR, Estimated: >60 mL/min (ref >60)
Glucose, Bld: 174 mg/dL — ABNORMAL HIGH (ref 70–99)
Potassium: 3.1 mmol/L — ABNORMAL LOW (ref 3.5–5.1)
Sodium: 146 mmol/L — ABNORMAL HIGH (ref 135–145)
Total Bilirubin: 0.5 mg/dL (ref 0.3–1.2)
Total Protein: 5.4 g/dL — ABNORMAL LOW (ref 6.5–8.1)

## 2019-10-24 LAB — BLOOD GAS, ARTERIAL
Acid-Base Excess: 1.4 mmol/L (ref 0.0–2.0)
Acid-Base Excess: 3.7 mmol/L — ABNORMAL HIGH (ref 0.0–2.0)
Bicarbonate: 30.4 mmol/L — ABNORMAL HIGH (ref 20.0–28.0)
Bicarbonate: 30 mmol/L — ABNORMAL HIGH (ref 20.0–28.0)
Drawn by: 535271
Drawn by: 53527
FIO2: 100
FIO2: 100
O2 Saturation: 91.8 %
O2 Saturation: 93.6 %
Patient temperature: 36.8
Patient temperature: 37.1
pCO2 arterial: 101 mmHg (ref 32.0–48.0)
pCO2 arterial: 66.8 mmHg (ref 32.0–48.0)
pH, Arterial: 7.106 — CL (ref 7.350–7.450)
pH, Arterial: 7.276 — ABNORMAL LOW (ref 7.350–7.450)
pO2, Arterial: 90.5 mmHg (ref 83.0–108.0)
pO2, Arterial: 81.8 mmHg — ABNORMAL LOW (ref 83.0–108.0)

## 2019-10-24 LAB — GLUCOSE, CAPILLARY
Glucose-Capillary: 116 mg/dL — ABNORMAL HIGH (ref 70–99)
Glucose-Capillary: 172 mg/dL — ABNORMAL HIGH (ref 70–99)
Glucose-Capillary: 190 mg/dL — ABNORMAL HIGH (ref 70–99)
Glucose-Capillary: 192 mg/dL — ABNORMAL HIGH (ref 70–99)
Glucose-Capillary: 198 mg/dL — ABNORMAL HIGH (ref 70–99)
Glucose-Capillary: 137 mg/dL — ABNORMAL HIGH (ref 70–99)

## 2019-10-24 LAB — PHOSPHORUS: Phosphorus: 3.6 mg/dL (ref 2.5–4.6)

## 2019-10-24 LAB — MAGNESIUM: Magnesium: 2.2 mg/dL (ref 1.7–2.4)

## 2019-10-24 MED ORDER — ORAL CARE MOUTH RINSE
15.0000 mL | OROMUCOSAL | Status: DC
  Administered 2019-10-24 – 2019-11-17 (×238): 15 mL via OROMUCOSAL

## 2019-10-24 MED ORDER — CHLORHEXIDINE GLUCONATE 0.12% ORAL RINSE (MEDLINE KIT)
15.0000 mL | Freq: Two times a day (BID) | OROMUCOSAL | Status: DC
  Administered 2019-10-24 – 2019-11-17 (×47): 15 mL via OROMUCOSAL

## 2019-10-24 MED ORDER — DOCUSATE SODIUM 50 MG/5ML PO LIQD
100.0000 mg | Freq: Two times a day (BID) | ORAL | Status: DC
  Administered 2019-10-24 – 2019-11-06 (×19): 100 mg via ORAL
  Filled 2019-10-24 (×22): qty 10

## 2019-10-24 MED ORDER — POLYETHYLENE GLYCOL 3350 17 G PO PACK: 17.0000 g | PACK | Freq: Every day | ORAL | Status: DC

## 2019-10-24 MED ORDER — ROCURONIUM BROMIDE 50 MG/5ML IV SOLN
75.0000 mg | Freq: Once | INTRAVENOUS | Status: AC
  Administered 2019-10-24: 75 mg via INTRAVENOUS
  Filled 2019-10-24: qty 7.5

## 2019-10-24 MED ORDER — POTASSIUM CHLORIDE 20 MEQ/15ML (10%) PO SOLN
20.0000 meq | ORAL | Status: AC
  Administered 2019-10-24 (×3): 20 meq
  Filled 2019-10-24 (×3): qty 15

## 2019-10-24 MED ORDER — FENTANYL 2500MCG IN NS 250ML (10MCG/ML) PREMIX INFUSION
0.0000 ug/h | INTRAVENOUS | Status: DC
  Administered 2019-10-24: 25 ug/h via INTRAVENOUS
  Administered 2019-10-25: 75 ug/h via INTRAVENOUS
  Administered 2019-10-26 – 2019-10-30 (×9): 200 ug/h via INTRAVENOUS
  Administered 2019-11-08: 50 ug/h via INTRAVENOUS
  Administered 2019-11-09 – 2019-11-13 (×8): 200 ug/h via INTRAVENOUS
  Administered 2019-11-13 – 2019-11-14 (×2): 100 ug/h via INTRAVENOUS
  Filled 2019-10-24 (×23): qty 250

## 2019-10-24 MED ORDER — FUROSEMIDE 10 MG/ML IJ SOLN
80.0000 mg | Freq: Once | INTRAMUSCULAR | Status: AC
  Administered 2019-10-24: 80 mg via INTRAVENOUS
  Filled 2019-10-24 (×2): qty 8

## 2019-10-24 MED ORDER — LIDOCAINE HCL 1 % IJ SOLN
INTRAMUSCULAR | Status: AC
  Filled 2019-10-24: qty 20

## 2019-10-24 MED ORDER — NOREPINEPHRINE 16 MG/250ML-% IV SOLN
0.0000 ug/min | INTRAVENOUS | Status: DC
  Administered 2019-10-24 – 2019-10-26 (×2): 10 ug/min via INTRAVENOUS
  Administered 2019-10-27 (×2): 12 ug/min via INTRAVENOUS
  Administered 2019-10-29: 3 ug/min via INTRAVENOUS
  Administered 2019-10-30: 12 ug/min via INTRAVENOUS
  Administered 2019-11-02 – 2019-11-03 (×2): 2 ug/min via INTRAVENOUS
  Administered 2019-11-06: 7 ug/min via INTRAVENOUS
  Administered 2019-11-08: 5 ug/min via INTRAVENOUS
  Administered 2019-11-09 – 2019-11-11 (×2): 10 ug/min via INTRAVENOUS
  Administered 2019-11-13: 9.5 ug/min via INTRAVENOUS
  Filled 2019-10-24 (×15): qty 250

## 2019-10-24 MED ORDER — FENTANYL CITRATE (PF) 100 MCG/2ML IJ SOLN
25.0000 ug | INTRAMUSCULAR | Status: AC | PRN
  Administered 2019-10-24 – 2019-10-25 (×3): 25 ug via INTRAVENOUS
  Filled 2019-10-24 (×2): qty 2

## 2019-10-24 MED ORDER — ETOMIDATE 2 MG/ML IV SOLN
20.0000 mg | Freq: Once | INTRAVENOUS | Status: AC
  Administered 2019-10-24: 20 mg via INTRAVENOUS

## 2019-10-24 MED ORDER — FENTANYL CITRATE (PF) 100 MCG/2ML IJ SOLN
25.0000 ug | INTRAMUSCULAR | Status: DC | PRN
  Administered 2019-10-25 (×3): 50 ug via INTRAVENOUS
  Administered 2019-10-25: 100 ug via INTRAVENOUS
  Administered 2019-10-25: 50 ug via INTRAVENOUS
  Administered 2019-10-25 – 2019-10-27 (×4): 100 ug via INTRAVENOUS
  Administered 2019-11-04 – 2019-11-09 (×3): 50 ug via INTRAVENOUS
  Administered 2019-11-12: 75 ug via INTRAVENOUS
  Administered 2019-11-12: 50 ug via INTRAVENOUS
  Filled 2019-10-24 (×6): qty 2

## 2019-10-24 MED ORDER — PROPOFOL 1000 MG/100ML IV EMUL
0.0000 ug/kg/min | INTRAVENOUS | Status: DC
  Administered 2019-10-24: 40 ug/kg/min via INTRAVENOUS
  Administered 2019-10-24: 10 ug/kg/min via INTRAVENOUS
  Administered 2019-10-24 – 2019-10-25 (×3): 40 ug/kg/min via INTRAVENOUS
  Administered 2019-10-25: 30 ug/kg/min via INTRAVENOUS
  Filled 2019-10-24 (×4): qty 100
  Filled 2019-10-24: qty 200

## 2019-10-24 NOTE — Progress Notes (Signed)
Patient ID: [name], female   DOB: 06-Sep-1950, 69 y.o.   MRN: [id number] EVENING ROUNDS NOTE :     [address]       [place] [address]             [phone]                 10 Days Post-Op Procedure(s) (LRB): XI ROBOTIC ASSISTED [place]OWN ESOPHAGECTOMY USING NIMS (N/A) VIDEO BRONCHOSCOPY (N/A) JEJUNOSTOMY PLACEMENT (N/A) INTERCOSTAL NERVE BLOCK (Right)  Total Length of Stay:  LOS: 10 days  BP (!) 115/48   Pulse 85   Temp 98.2 F (36.8 C) (Oral)   Resp 20   Ht 5\' 2"  (1.575 m)   Wt 74.6 kg   SpO2 100%   BMI 30.08 kg/m   .Intake/Output      10/17 0701 - 10/18 0700 10/18 0701 - 10/19 0700   I.V. (mL/kg) 531.6 (7.1) 499.6 (6.7)   Other  30   NG/GT 1170 315   IV Piggyback 550.6 69.1   Total Intake(mL/kg) 2252.3 (30.2) 913.7 (12.2)   Urine (mL/kg/hr) 1100 (0.6) 1000 (1.2)   Stool 100 75   Chest Tube 0    Total Output 1200 1075   Net +1052.3 -161.3          . sodium chloride Stopped (10/23/19 1145)  . amiodarone 30 mg/hr (10/24/19 1500)  . feeding supplement (VITAL AF 1.2 CAL) Stopped (10/24/19 1400)  . fentaNYL infusion INTRAVENOUS 75 mcg/hr (10/24/19 1500)  . norepinephrine (LEVOPHED) Adult infusion 10 mcg/min (10/24/19 1500)  . piperacillin-tazobactam (ZOSYN)  IV 12.5 mL/hr at 10/24/19 1500  . propofol (DIPRIVAN) infusion 50 mcg/kg/min (10/24/19 1509)     Lab Results  Component Value Date   WBC 13.6 (H) 10/24/2019   HGB 8.1 (L) 10/24/2019   HCT 27.0 (L) 10/24/2019   PLT 205 10/24/2019   GLUCOSE 174 (H) 10/24/2019   CHOL  01/20/2007    96        ATP III CLASSIFICATION:  <200     mg/dL   Desirable  200-239  mg/dL   Borderline High  >=240    mg/dL   High          TRIG 703 (H) 10/23/2019   HDL 28 (L) 01/20/2007   LDLCALC  01/20/2007    36        Total Cholesterol/HDL:CHD Risk Coronary Heart Disease Risk Table                     Men   Women  1/2 Average Risk   3.4   3.3  Average Risk       5.0   4.4  2 X Average Risk   9.6    7.1  3 X Average Risk  23.4   11.0        Use the calculated Patient Ratio above and the CHD Risk Table to determine the patient's CHD Risk.        ATP III CLASSIFICATION (LDL):  <100     mg/dL   Optimal  100-129  mg/dL   Near or Above                    Optimal  130-159  mg/dL   Borderline  160-189  mg/dL   High  >190     mg/dL   Very High   ALT 15 10/24/2019   AST 18 10/24/2019   NA  146 (H) 10/24/2019   K 3.1 (L) 10/24/2019   CL 106 10/24/2019   CREATININE 0.88 10/24/2019   BUN 31 (H) 10/24/2019   CO2 29 10/24/2019   TSH 2.153 06/12/2019   INR 1.3 (H) 10/11/2019   HGBA1C 5.8 (H) 10/11/2019   DG CHEST PORT 1 VIEW  Result Date: 10/24/2019 CLINICAL DATA:  Intubation EXAM: PORTABLE CHEST 1 VIEW COMPARISON:  Earlier today FINDINGS: Endotracheal tube with tip just below the clavicular heads. Porta catheter on the right with tip at the right atrium. Left PICC in unremarkable position. Tunneled pleural catheter on the right with lateral loculated appearing pleural based opacity. Diffuse interstitial coarsening. No pneumothorax seen. Normal heart size. IMPRESSION: 1. New endotracheal tube in unremarkable position. 2. Pulmonary and right pleural opacity (likely loculated) without change from earlier today. Electronically Signed   By: Monte Fantasia M.D.   On: 10/24/2019 07:18   DG Chest Port 1 View  Result Date: 10/24/2019 CLINICAL DATA:  Pneumothorax with sudden drop in oxygen saturation. EXAM: PORTABLE CHEST 1 VIEW COMPARISON:  10/21/2019 FINDINGS: Power port type central venous catheter with tip over the low SVC region. Left PICC line with tip over the cavoatrial junction region. Right chest tube. No significant residual pneumothorax. Significant interval increase in bilateral diffuse airspace and interstitial infiltrates consistent with edema or aspiration. Pneumonia would be a less likely possibility. Increasing consolidation or loculated fluid in the right lung base laterally.  Cardiac enlargement. Calcification of the aorta. IMPRESSION: Significant interval increase in bilateral diffuse airspace and interstitial infiltrates most consistent with edema or aspiration. Increasing consolidation or loculated fluid in the right lung base laterally. Electronically Signed   By: Lucienne Capers M.D.   On: 10/24/2019 05:37   CT [facility] PLEURAL DRAIN W/INDWELL CATH W/IMG GUIDE  Result Date: 10/24/2019 INDICATION: 69 year old female referred for pigtail catheter placement into a loculated right pleural fluid collection EXAM: IMAGE GUIDED PLACEMENT OF PIGTAIL DRAINAGE CATHETER INTO THE RIGHT PLEURAL SPACE MEDICATIONS: The patient is currently admitted to the hospital and receiving intravenous antibiotics. The antibiotics were administered within an appropriate time frame prior to the initiation of the procedure. ANESTHESIA/SEDATION: No sedation COMPLICATIONS: None PROCEDURE: Informed written consent was obtained from the patient after a thorough discussion of the procedural risks, benefits and alternatives. All questions were addressed. Maximal Sterile Barrier Technique was utilized including caps, mask, sterile gowns, sterile gloves, sterile drape, hand hygiene and skin antiseptic. A timeout was performed prior to the initiation of the procedure. Patient was positioned supine position on CT gantry table. Scout CT was acquired for planning purposes. The anterior right chest was prepped and draped in the usual sterile fashion. 1% lidocaine was used for local anesthesia. Using CT guidance, a Yueh needle was advanced into the right pleural space from a right anterior approach, targeting loculated fluid in the lateral right chest. Using modified Seldinger technique, 10 French pigtail drainage catheter was placed into the pleural space. Catheter was sutured in position and attached to a pleura vac system. Sample of turbid yellow fluid was sent for culture. We confirmed operational chest tube with wall  suction on the pleura vac and secured the drain in place. Patient tolerated the procedure well and remained hemodynamically stable throughout. No complications were encountered and no significant blood loss. IMPRESSION: Status post CT-guided placement of pigtail drainage catheter into the right loculated pleural fluid. Signed, Dulcy Fanny. Dellia Nims, [place]iology Electronically Signed   By: Corrie Mckusick D.O.  On: 10/24/2019 16:51   intubated today Pig tail placed un undraed pocket right chest Cr stable bp stable sedated Neck incision open and packed   [name] MD  Beeper [phone] Office [phone] 10/24/2019 5:55 PM

## 2019-10-24 NOTE — Progress Notes (Signed)
This morning RN transitioned her from a HFNC-12L with Sats in low 80s to a Non-Rebreather with Sats in low to mid 90s. The RN called to request [name]'s morning x-ray be done earlier due to having an increased work of breathing. Dr. Kipp Brood called in reference to [name]'s respiratory status.  [name] complains of having a hard time breathing still and has requested that the oxygen be turned up on the Non-Rebreather facemask. The RN informed her the O2 was already up and that the MD had already been contacted. The CCM ground team came to the bedside. After reviewing the x-ray and being updated on the events, the team discussed reintubation with [name] who did agree. [name] was intubated this AM at 0640.

## 2019-10-24 NOTE — Progress Notes (Signed)
Critical Care Progress Note:  Called to bedside by [facility] physician for worsening hypoxia and increased work of breathing. Patient was extubated earlier in the day. CXR showed interval increase in bilateral opacities along with newly developing right lower lobe loculated effusion of uncertain etiology. On exam, she is in clear distress, speaking in short sentences. Expiratory rhonchi are present in the bilateral anterior lung fields. SpO2 in the low 90s/upper 80s on 100% FiO2 via NRB. Given post-extubation failure with concern for possible TE fistula or post-op leak, the decision was made to reintubate the patient. Review of I/O shows that she is significantly positive for the admission as well.  Will give Lasix 80 mg IV x1 post-intubation. Continue current antibiotics. F/u ABG, CXR ordered. Consider further evaluation for TE fistula.  Family called and notified of change.  Bennie Pierini, MD 10/24/19 7:00 AM

## 2019-10-24 NOTE — Procedures (Signed)
Intubation Procedure Note  [name]  [id number]  10/09/1950  Date:10/24/19  Time:6:49 AM   Provider Performing:Araceli Coufal Sharene Butters    Procedure: Intubation (31500)  Indication(s) Respiratory Failure  Consent Risks of the procedure as well as the alternatives and risks of each were explained to the patient and/or caregiver.  Consent for the procedure was obtained and is signed in the bedside chart   Anesthesia Etomidate and Rocuronium   Time Out Verified patient identification, verified procedure, site/side was marked, verified correct patient position, special equipment/implants available, medications/allergies/relevant history reviewed, required imaging and test results available.   Sterile Technique Usual hand hygeine, masks, and gloves were used   Procedure Description Patient positioned in bed supine.  Sedation given as noted above.  Patient was intubated with endotracheal tube using Glidescope.  View was Grade 1 full glottis .  Number of attempts was 1.  Colorimetric CO2 detector was consistent with tracheal placement.   Complications/Tolerance None; patient tolerated the procedure well. Chest X-ray is ordered to verify placement.   EBL Minimal   Specimen(s) None  Bennie Pierini, MD 10/24/19 6:49 AM

## 2019-10-24 NOTE — Progress Notes (Signed)
OT Cancellation Note  Patient Details Name: JAMIAH HOMEYER MRN: [id number] DOB: [birth year]   Cancelled Treatment:    Reason Eval/Treat Not Completed: Medical issues which prohibited therapy (pt reintubated)  Malka So 10/24/2019, 12:39 PM  Nestor Lewandowsky, OTR/L Acute Rehabilitation Services Pager: [phone] Office: [phone]

## 2019-10-24 NOTE — Progress Notes (Signed)
CRITICAL VALUE ALERT  Critical Value:  ABG pH: 7.1  PCO2: 101  Date & Time Notied: 10/18 0830  Provider Notified: Dr. Loanne Drilling CCM  Orders Received/Actions taken:  Vent changes made by MD RT notified

## 2019-10-24 NOTE — Progress Notes (Signed)
NAME:  [name], MRN:  [id number], DOB:  [birth year], LOS: 28 ADMISSION DATE:  10/13/2019, CONSULTATION DATE: 10/20/2019 REFERRING MD:  Dr. Kipp Brood, CHIEF COMPLAINT:  SOB   Brief History   [name] is a 69 y/o female with a PMHx of esophageal squamous cell carcinoma admitted on 10/08 for esophagectomy with interval development of respiratory distress requiring BiPAP and transfer to ICU.   History of present illness   [name] presented to [facility] for admission for elective esophagectomy on 10/08 that proceeded without any complications. She is currently POD 6.   Overnight, patient developed worsening work of breathing requiring NRB. She was noted to be diaphoretic with decreased lung sounds by the RN. Rapid response was called, who switched chest tube from water seal to suction. Air leak noted in [place]. CXR obtained overnight showed small right basilar pneumothorax with loculated pleural effusion, as well as interval development of extensive airspace infiltrate in the LUL. Repeat CXR this morning shows improvement in pneumothorax with no change in opacities. In the late morning, patient developed worsening respiratory distress requiring BiPAP placement and transfer to ICU for close monitoring.  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 Fib,   Significant Hospital Events   10/08 >> Admitted to [facility] 10/14 >> Transferred to ICU for respiratory distress   Consults:  PCCM  Procedures:  10/08 >> Esophagectomy 10/15 >> ETT placed, Bronchoscopy     Significant Diagnostic Tests:  CXR 10/13 >> Small right basilar pneumothorax, however, has increased in size since prior examination and there has now developed a small amount of laterally loculated pleural fluid. Extensive airspace infiltrate has now developed throughout the left upper lobe, likely infectious in the appropriate clinical setting.   CXR 10/14  >> Right basilar  pneumothorax is smaller compared to earlier in the day. No tension component. Subcutaneous air present on the right. There is extensive airspace opacity throughout the left upper lobe and left perihilar regions, similar to earlier in the day. There is patchy atelectasis in the right mid lung and right base with suspected loculated pleural effusion on the right laterally  Micro Data:  None  Antimicrobials:  Zosyn 10/14 >>   Interim history/subjective:   Extubated however failed and required re-intubation overnight  Objective   Blood pressure (!) 113/48, pulse 88, temperature 98.2 F (36.8 C), temperature source Oral, resp. rate (!) 21, height 5\' 2"  (1.575 m), weight 74.6 kg, SpO2 100 %.    Vent Mode: PRVC FiO2 (%):  [100 %] 100 % Set Rate:  [22 bmp-28 bmp] 28 bmp Vt Set:  [300 mL] 300 mL PEEP:  [8 cmH20] 8 cmH20 Plateau Pressure:  [23 cmH20-24 cmH20] 24 cmH20   Intake/Output Summary (Last 24 hours) at 10/24/2019 1523 Last data filed at 10/24/2019 1401 Gross per 24 hour  Intake 2138.34 ml  Output 1955 ml  Net 183.34 ml   Filed Weights   10/21/19 0500 10/22/19 0424 10/23/19 0347  Weight: 73.1 kg 73.5 kg 74.6 kg   Physical Exam: General: Elderly chronically ill-appearing, no acute distress HENT: [place], AT, ETT in place Neck: Left neck dressing in place Respiratory: Coarse breath sounds bilaterally.  No crackles, wheezing or rales. Right chest tube in place Cardiovascular: RRR, -M/R/G, no JVD GI: BS+, soft, nontender Extremities:-Edema,-tenderness Neuro: Sedated GU: Foley in place  CBC Latest Ref Rng & Units 10/24/2019 10/23/2019 10/22/2019  WBC 4.0 - 10.5 K/uL 13.6(H) 21.8(H) 19.6(H)  Hemoglobin  12.0 - 15.0 g/dL 8.1(L) 9.7(L) 10.4(L)  Hematocrit 36 - 46 % 27.0(L) 30.7(L) 32.7(L)  Platelets 150 - 400 K/uL 205 272 296   CMP Latest Ref Rng & Units 10/24/2019 10/23/2019 10/22/2019  Glucose 70 - 99 mg/dL 174(H) 223(H) 188(H)  BUN 8 - 23 mg/dL 31(H) 34(H) 31(H)  Creatinine  0.44 - 1.00 mg/dL 0.88 1.11(H) 1.20(H)  Sodium 135 - 145 mmol/L 146(H) 140 143  Potassium 3.5 - 5.1 mmol/L 3.1(L) 4.0 3.3(L)  Chloride 98 - 111 mmol/L 106 103 104  CO2 22 - 32 mmol/L 29 26 25   Calcium 8.9 - 10.3 mg/dL 8.2(L) 7.6(L) 8.1(L)  Total Protein 6.5 - 8.1 g/dL 5.4(L) 5.1(L) 5.9(L)  Total Bilirubin 0.3 - 1.2 mg/dL 0.5 0.9 1.1  Alkaline Phos 38 - 126 U/L 54 72 86  AST 15 - 41 U/L 18 19 21   ALT 0 - 44 U/L 15 18 24    CXR 10/24/19 Loculated R pleural effusion, ETT in place, right chest tube  Resolved Hospital Problem list   AKI  Assessment & Plan:  # Acute hypoxemic and hypercarbic Respiratory Failure  # Aspiration Pneumonia/Pseudomonas Aeruginosa HCAP # R loculated pleural effusion # Question volume overloaded state of heart # Pneumothorax (R): Resolved with tube thoracostomy # Hx of Asthma  Re-intubated overnight. Will likely need trach in the future. Will discuss with family this week ABG reviewed. Full vent support. Increased RR for hypercarbia. Weaning protocol Chest tube management per primary. IR consulted for loculated effusion drainage Continue Zosyn, follow-up final tracheal aspirate and JP drain culture May need to reevaluate anastomosis in the next day or 2 Progressive mobility, IS, and physical therapy will be paramount going forward  # Sinus Tachycardia # Hx of Atrial Fibrillation  -Continue full dose Lovenox - Sinus rhythm at present  # Esophageal Squamous Cell Carcinoma s/p Esophagectomy  - Management of incision site per TCTS  - f/u incision fluid culture  # Non-obstructive CAD # LAD saccular aneurysm  # HTN  - Home Plavix on hold - Continue aspirin - Home Imdur on hold  - Resume home metoprolol when Bps allow  # Hx of Hypothyroidism  - Continue daily Levothyroxine  # High TG- should resolve off propofol  Best practice:  Diet: Tube Feeds per G Tube  Pain/Anxiety/Delirium protocol (if indicated): wean off VAP protocol (if indicated):  Ordered  DVT prophylaxis: full dose lovenox GI prophylaxis: Protonix Glucose control: SSI Mobility: up to chair Code Status: FULL Family Communication: Per Primary Disposition: ICU  The patient is critically ill with multiple organ systems failure and requires high complexity decision making for assessment and support, frequent evaluation and titration of therapies, application of advanced monitoring technologies and extensive interpretation of multiple databases.  Independent Critical Care Time: 35 Minutes.   Rodman Pickle, M.D. [facility] Pulmonary/Critical Care Medicine 10/24/2019 3:23 PM   Please see Amion for pager number to reach on-call Pulmonary and Critical Care Team.

## 2019-10-24 NOTE — Procedures (Signed)
Interventional Radiology Procedure Note  Procedure: Image guided drain placement, right chest tube, anterior approach.  22F pigtail drain.  Complications: None  EBL: None Sample: Culture sent  Recommendations: - Routine drain care, on pleurevac - follow up Cx - routine wound care  Signed,  Dulcy Fanny. Earleen Newport, DO

## 2019-10-24 NOTE — Progress Notes (Signed)
PT Cancellation Note  Patient Details Name: [name] MRN: [id number] DOB: 04/30/1950   Cancelled Treatment:    Reason Eval/Treat Not Completed: Medical issues which prohibited therapy (Pt reintubated this am. Check back tomorrow. )   Godfrey Pick Caroljean Monsivais 10/24/2019, 9:38 AM Reham Slabaugh W,PT Acute Rehabilitation Services Pager:  [phone]  Office:  [phone]

## 2019-10-24 NOTE — Sedation Documentation (Signed)
Pt accompanied by SWAT RN and Respiratory for chest tube placement in CT.  The patient is currently on multiple gtts and sedated. Sedation titrated by SWAT RN.  Pt is being monitored   TF: turned off at 14:12

## 2019-10-24 NOTE — Progress Notes (Signed)
[address]       [place] [address]             [phone]                 10 Days Post-Op Procedure(s) (LRB): XI ROBOTIC ASSISTED [place] ESOPHAGECTOMY USING NIMS (N/A) VIDEO BRONCHOSCOPY (N/A) JEJUNOSTOMY PLACEMENT (N/A) INTERCOSTAL NERVE BLOCK (Right)   Events: Re-intubated this am _______________________________________________________________ Vitals: BP (!) 162/49   Pulse (!) 115   Temp 98.2 F (36.8 C) (Oral)   Resp (!) 22   Ht 5\' 2"  (1.575 m)   Wt 74.6 kg   SpO2 93%   BMI 30.08 kg/m   - Neuro: alert NAD  - Cardiovascular: afib  Drips: amio.      - Pulm:  Vent Mode: PRVC FiO2 (%):  [50 %-100 %] 100 % Set Rate:  [22 bmp] 22 bmp Vt Set:  [300 mL] 300 mL PEEP:  [5 cmH20-8 cmH20] 8 cmH20 Pressure Support:  [8 cmH20] 8 cmH20 Plateau Pressure:  [23 cmH20-24 cmH20] 24 cmH20  ABG    Component Value Date/Time   PHART 7.368 10/21/2019 1004   PCO2ART 50.5 (H) 10/21/2019 1004   PO2ART 99 10/21/2019 1004   HCO3 29.2 (H) 10/21/2019 1004   TCO2 31 10/21/2019 1004   ACIDBASEDEF 7.8 (H) 10/21/2019 2050   O2SAT 97.0 10/21/2019 1004    - Abd: soft.  J tube in place - Extremity: warm  .Intake/Output      10/17 0701 - 10/18 0700 10/18 0701 - 10/19 0700   I.V. (mL/kg) 481.7 (6.5)    Other     NG/GT 504.8    IV Piggyback 513.1    Total Intake(mL/kg) 1499.5 (20.1)    Urine (mL/kg/hr) 700 (0.4)    Stool 100    Chest Tube 0    Total Output 800    Net +699.5            _______________________________________________________________ Labs: CBC Latest Ref Rng & Units 10/24/2019 10/23/2019 10/22/2019  WBC 4.0 - 10.5 K/uL 13.6(H) 21.8(H) 19.6(H)  Hemoglobin 12.0 - 15.0 g/dL 8.1(L) 9.7(L) 10.4(L)  Hematocrit 36 - 46 % 27.0(L) 30.7(L) 32.7(L)  Platelets 150 - 400 K/uL 205 272 296   CMP Latest Ref Rng & Units 10/24/2019 10/23/2019 10/22/2019  Glucose 70 - 99 mg/dL 174(H) 223(H) 188(H)  BUN 8 - 23 mg/dL 31(H) 34(H) 31(H)  Creatinine  0.44 - 1.00 mg/dL 0.88 1.11(H) 1.20(H)  Sodium 135 - 145 mmol/L 146(H) 140 143  Potassium 3.5 - 5.1 mmol/L 3.1(L) 4.0 3.3(L)  Chloride 98 - 111 mmol/L 106 103 104  CO2 22 - 32 mmol/L 29 26 25   Calcium 8.9 - 10.3 mg/dL 8.2(L) 7.6(L) 8.1(L)  Total Protein 6.5 - 8.1 g/dL 5.4(L) 5.1(L) 5.9(L)  Total Bilirubin 0.3 - 1.2 mg/dL 0.5 0.9 1.1  Alkaline Phos 38 - 126 U/L 54 72 86  AST 15 - 41 U/L 18 19 21   ALT 0 - 44 U/L 15 18 24     CXR: Worsening left interstitial process.  New right loculated fluid collection  _______________________________________________________________  Assessment and Plan: POD 10 s/p Robotic 3 field esophagectomy.  Respiratory failure, cervical anastamotic leak  Neuro: pain controlled CV: afib on amio.  Will hold eliquis for CT guided drain Pulm: high likelihood that pt will require trach.  Will order CT chest to eval new right loculated fluid collection.  Will order CT guided drain Renal: stable GI: receiving J tube  feeds.   Heme: stable ID: wet to dry dressing to cervical incision.  Continue abx of asp pneumonia Endo: SSI  Dispo: continue ICU care  Melodie Bouillon, MD 10/24/2019 7:47 AM

## 2019-10-24 NOTE — Progress Notes (Signed)
eLink Physician-Brief Progress Note Patient Name: [name] DOB: 11-16-1950 MRN: [id number]   Date of Service  10/24/2019  HPI/Events of Note  RN reports this patient is having increasing respiratory distress on NRB. He is s/p esophagectomy. Dr Kipp Brood requested bedside team see the patient to assess whether they require reintubation.   eICU Interventions  Bedside team informed and will come to evaluate the patient.     Intervention Category Major Interventions: Respiratory failure - evaluation and management  Marily Lente Amanpreet Delmont 10/24/2019, 5:46 AM

## 2019-10-24 NOTE — Consult Note (Signed)
Chief Complaint: Loculated pleural effusion.   Referring Physician(s): Dr. Tami Ribas  Supervising Physician: Corrie Mckusick  Patient Status: [facility] - In-pt  History of Present Illness: [name] is a 69 y.o. female  Admitted for bronch, esophagectomy, jejunostomy  on 10.8.21. Now with a cervical anastomotic leak  in acute respiratory failure intubated this morning. Chest xray from 10.18.1 shows a right sided pleural opacity. Team is requesting a right sided chest tube for loculation pleural effusion.  Past Medical History:  Diagnosis Date  . Asthma   . Cancer ([place])    esophageal cancer  . COPD (chronic obstructive pulmonary disease) ([place])   . Coronary artery disease   . Depression   . Diabetes mellitus without complication ([place])    Type II  . Diastolic dysfunction   . Diverticulosis   . Fatty liver   . GERD without esophagitis   . Hx of Clostridium difficile infection   . Hx of colonic polyps   . Hx of small bowel obstruction   . Hypertension, benign   . Hypothyroidism   . Irritable bowel syndrome   . Left bundle branch block   . Lumbar herniated disc   . Mixed hyperlipidemia   . Osteoarthritis   . PAF (paroxysmal atrial fibrillation) ([place])    06/2019  . Pneumonia   . Sleep apnea    Uses O2 at bedtime on occasion    Past Surgical History:  Procedure Laterality Date  . ABDOMINAL HYSTERECTOMY  2002  . APPENDECTOMY  2005  . CARDIAC CATHETERIZATION    . CARPAL TUNNEL RELEASE Bilateral   . INTERCOSTAL NERVE BLOCK Right 10/22/2019   Procedure: INTERCOSTAL NERVE BLOCK;  Surgeon: Lajuana Matte, MD;  Location: [place];  Service: Thoracic;  Laterality: Right;  . JEJUNOSTOMY N/A 10/20/2019   Procedure: JEJUNOSTOMY PLACEMENT;  Surgeon: Lajuana Matte, MD;  Location: [place];  Service: Thoracic;  Laterality: N/A;  . LEFT HEART CATH AND CORONARY ANGIOGRAPHY N/A 12/28/2018   Procedure: LEFT HEART CATH AND CORONARY ANGIOGRAPHY;  Surgeon: Belva Crome, MD;   Location: [place] [facility];  Service: Cardiovascular;  Laterality: N/A;  . SMALL INTESTINE SURGERY  11/26/2012   Bowel obstruction, Dr. Pauletta Browns  . TUBAL LIGATION  1980  . VIDEO BRONCHOSCOPY N/A 10/22/2019   Procedure: VIDEO BRONCHOSCOPY;  Surgeon: Lajuana Matte, MD;  Location: [facility];  Service: Thoracic;  Laterality: N/A;    Allergies: Honey bee venom protein [bee venom], Ether, Nickel, Other, and Tape  Medications: Prior to Admission medications   Medication Sig Start Date End Date Taking? Authorizing Provider  Amino Acids-Protein Hydrolys (FEEDING SUPPLEMENT, PRO-STAT SUGAR FREE 64,) LIQD Place 30 mLs into feeding tube 2 (two) times daily. Patient taking differently: Place 30 mLs into feeding tube in the morning, at noon, in the evening, and at bedtime.  06/14/19  Yes Lavina Hamman, MD  aspirin EC 81 MG tablet Take 1 tablet (81 mg total) by mouth daily. 12/22/18  Yes Tobb, Kardie, DO  BREO ELLIPTA 100-25 MCG/INH AEPB Inhale 1 puff into the lungs at bedtime.  08/18/18  Yes [provider]  Calcium Carbonate-Vitamin D (CALCIUM 600/VITAMIN D PO) Take 1 tablet by mouth daily.   Yes [provider]  EPINEPHrine 0.3 mg/0.3 mL IJ SOAJ injection Inject 0.3 mg into the muscle as needed for anaphylaxis. 11/14/18  Yes [provider]  FLUoxetine (PROZAC) 20 MG capsule Take 20 mg by mouth daily. Pt currently not taking, as she feels  she doesn't needs them at this time.   Yes [provider]  gabapentin (NEURONTIN) 300 MG capsule Take 300 mg by mouth 2 (two) times daily.  08/19/18  Yes [provider]  isosorbide mononitrate (IMDUR) 30 MG 24 hr tablet TAKE 1 TABLET BY MOUTH  DAILY Patient taking differently: Take 30 mg by mouth daily.  08/26/19  Yes Tobb, Kardie, DO  levothyroxine (SYNTHROID) 75 MCG tablet Take 75 mcg by mouth daily before breakfast.  08/23/18  Yes [provider]  metoprolol tartrate (LOPRESSOR) 25 MG tablet Take 0.5 tablets  (12.5 mg total) by mouth 2 (two) times daily. 07/12/19 10/20/2019 Yes Tobb, Kardie, DO  mometasone (NASONEX) 50 MCG/ACT nasal spray Place 2 sprays into the nose daily as needed (pain).    Yes [provider]  nitroGLYCERIN (NITROSTAT) 0.4 MG SL tablet Place 1 tablet (0.4 mg total) under the tongue every 5 (five) minutes as needed for chest pain. 07/12/19 10/10/19 Yes Tobb, Kardie, DO  oxyCODONE (OXY IR/ROXICODONE) 5 MG immediate release tablet Take 10 mg by mouth every 4 (four) hours as needed for moderate pain.  09/27/19  Yes [provider]  pantoprazole (PROTONIX) 40 MG tablet Take 40 mg by mouth daily.  10/14/18  Yes [provider]  potassium chloride 20 MEQ/15ML (10%) SOLN Take 20 mEq by mouth daily. 06/22/19  Yes [provider]  promethazine (PHENERGAN) 25 MG tablet Take 25 mg by mouth every 8 (eight) hours as needed for nausea or vomiting.  06/28/19  Yes [provider]  rosuvastatin (CRESTOR) 40 MG tablet TAKE 1 TABLET BY MOUTH  DAILY Patient taking differently: Take 40 mg by mouth daily.  08/26/19  Yes Tobb, Kardie, DO  Water For Irrigation, Sterile (FREE WATER) SOLN Place 200 mLs into feeding tube every 8 (eight) hours. 06/14/19  Yes Lavina Hamman, MD  Ascorbic Acid (VITAMIN C) 100 MG tablet Take 100 mg by mouth daily. Patient not taking: Reported on 10/03/2019    [provider]  oxyCODONE-acetaminophen (PERCOCET) 10-325 MG tablet Take 1 tablet by mouth every 4 (four) hours as needed for pain. Patient not taking: Reported on 10/03/2019    [provider]  rifaximin (XIFAXAN) 550 MG TABS tablet Take 550 mg by mouth See admin instructions. Take 1 tablet (550 mg) by mouth twice daily for 2 weeks, hold for 4 months then resume cycle Patient not taking: Reported on 10/03/2019    [provider]  sucralfate (CARAFATE) 1 GM/10ML suspension Take 10 mLs (1 g total) by mouth 4 (four) times daily -  with meals and at bedtime. Patient not  taking: Reported on 10/03/2019 06/14/19   Lavina Hamman, MD     Family History  Problem Relation Age of Onset  . Ovarian cancer Sister   . Lung cancer Brother   . Stomach cancer Maternal Grandmother   . Colon cancer Daughter     Social History   Socioeconomic History  . Marital status: Divorced    Spouse name: Not on file  . Number of children: 3  . Years of education: Not on file  . Highest education level: Not on file  Occupational History  . Not on file  Tobacco Use  . Smoking status: Former Smoker    Packs/day: 1.00    Quit date: 04/2019    Years since quitting: 0.5  . Smokeless tobacco: Never Used  Vaping Use  . Vaping Use: Never used  Substance and Sexual Activity  . Alcohol  use: Not Currently  . Drug use: Not Currently  . Sexual activity: Not on file  Other Topics Concern  . Not on file  Social History Narrative  . Not on file   Social Determinants of Health   Financial Resource Strain:   . Difficulty of Paying Living Expenses: Not on file  Food Insecurity:   . Worried About Charity fundraiser in the Last Year: Not on file  . Ran Out of Food in the Last Year: Not on file  Transportation Needs:   . Lack of Transportation (Medical): Not on file  . Lack of Transportation (Non-Medical): Not on file  Physical Activity:   . Days of Exercise per Week: Not on file  . Minutes of Exercise per Session: Not on file  Stress:   . Feeling of Stress : Not on file  Social Connections:   . Frequency of Communication with Friends and Family: Not on file  . Frequency of Social Gatherings with Friends and Family: Not on file  . Attends Religious Services: Not on file  . Active Member of Clubs or Organizations: Not on file  . Attends Archivist Meetings: Not on file  . Marital Status: Not on file     Review of Systems: A 12 point ROS discussed and pertinent positives are indicated in the HPI above.  All other systems are negative.  Review of Systems    Unable to perform ROS: Acuity of condition    Vital Signs: BP (!) 109/48   Pulse (!) 110   Temp 98.2 F (36.8 C) (Oral)   Resp 20   Ht 5\' 2"  (1.575 m)   Wt 164 lb 7.4 oz (74.6 kg)   SpO2 96%   BMI 30.08 kg/m   Physical Exam Vitals and nursing note reviewed.  Constitutional:      Appearance: She is well-developed.  HENT:     Head: Normocephalic and atraumatic.  Eyes:     Conjunctiva/sclera: Conjunctivae normal.  Pulmonary:     Comments: vented Musculoskeletal:        General: Normal range of motion.     Cervical back: Normal range of motion.  Skin:    General: Skin is warm.  Psychiatric:     Comments:  Unable to assess due to patient condition.     Imaging: DG Chest 1 View  Result Date: 10/21/2019 CLINICAL DATA:  Intubation EXAM: CHEST  1 VIEW COMPARISON:  10/21/2019 at 0536 hours FINDINGS: Interval placement of endotracheal tube with distal tip terminating 1.9 cm above the carina. Stable positioning of right-sided Port-A-Cath and right chest tube. Stable cardiomediastinal contours. Atherosclerotic calcification of the aortic knob. Persistent interstitial opacities throughout the left lung. Small bilateral pleural effusions. No discernible pneumothorax. IMPRESSION: 1. Interval placement of endotracheal tube with distal tip terminating 1.9 cm above the carina. 2. Otherwise stable exam. Electronically Signed   By: Davina Poke D.O.   On: 10/21/2019 09:04   DG Chest 1 View  Result Date: 10/17/2019 CLINICAL DATA:  Right pneumothorax EXAM: CHEST  1 VIEW COMPARISON:  10/16/2019 chest radiograph and prior. FINDINGS: Small right apical pneumothorax, unchanged. Left predominant bibasilar opacities and small left pleural effusion, unchanged. Stable support devices.  Telemetry wires overlie the chest. Right chest wall subcutaneous emphysema. Partially obscured cardiomediastinal silhouette. IMPRESSION: Right apical pneumothorax, unchanged.  Indwelling right chest tube. Bibasilar  opacities and small left pleural effusion, unchanged. Electronically Signed   By: Primitivo Gauze M.D.   On: 10/17/2019 08:18  DG Chest 1 View  Result Date: 10/16/2019 CLINICAL DATA:  Follow-up of right-sided pleural effusion. Status post esophagectomy. EXAM: CHEST  1 VIEW COMPARISON:  One day prior FINDINGS: Patient moderately rotated left. Radiopaque density projecting over the left side of the thoracic inlet, as before. Nasogastric tube extends beyond the inferior aspect of the film. Right internal jugular line tip at low SVC. Right-sided chest tube is similar in position, terminating over the upper chest. The right-sided apical pneumothorax has resolved. Hemidiaphragm is well visualized and sub pulmonic air is suspected. Decrease in small volume right-sided subcutaneous emphysema. Mild cardiomegaly. Atherosclerosis in the transverse aorta. Small left pleural effusion is new. Increased bibasilar airspace disease. IMPRESSION: Right-sided chest tube remaining in place with decrease in right-sided pneumothorax. Suspect residual small volume sub pulmonic air. Worsened aeration with developing left pleural fluid and bibasilar airspace disease. Most likely atelectasis. At the left lung base, infection or aspiration is a consideration. Probable surgical drain projecting over the upper left chest, as before. Electronically Signed   By: Abigail Miyamoto M.D.   On: 10/16/2019 11:17   DG Chest 2 View  Result Date: 10/11/2019 CLINICAL DATA:  Preop EXAM: CHEST - 2 VIEW COMPARISON:  July 19th 2021 FINDINGS: The cardiomediastinal silhouette is unchanged in contour.RIGHT chest port with tip terminating over the SVC. Atherosclerotic calcifications of the aorta. No pleural effusion. No pneumothorax. LEFT basilar atelectasis versus scar. No new focal consolidation. Visualized abdomen is unremarkable. Multilevel degenerative changes of the thoracic spine. Wedging of a vertebral body at the thoracolumbar junction, mildly  increased in comparison to prior CT. IMPRESSION: LEFT basilar atelectasis versus scar. No new focal consolidation. Electronically Signed   By: Valentino Saxon MD   On: 10/11/2019 16:03   NM PET Image Initial (PI) Skull Base To Thigh  Result Date: 09/27/2019 CLINICAL DATA:  Subsequent treatment strategy for esophageal cancer. EXAM: NUCLEAR MEDICINE PET SKULL BASE TO THIGH TECHNIQUE: 7.94 mCi F-18 FDG was injected intravenously. Full-ring PET imaging was performed from the skull base to thigh after the radiotracer. CT data was obtained and used for attenuation correction and anatomic localization. Fasting blood glucose: 87 mg/dl COMPARISON:  CT abdomen and pelvis of August 16, 2019 and July 25, 2019 also with PET-CT of May 18, 2019 for comparison. FINDINGS: Mediastinal blood pool activity: SUV max 2.03 Liver activity: SUV max NA NECK: No hypermetabolic lymph nodes in the neck. Incidental CT findings: none CHEST: Area of uptake within the esophagus in mid esophagus at the site of esophageal thickening shows diminished uptake compared to the prior study with a maximum SUV of 8.7 select 8.78.9, previous maximum SUV of 15.3 this extends along approximately 4 cm of the esophagus extending from approximately the level of the carina inferiorly. No hypermetabolic lymph nodes in the chest. Incidental CT findings: RIGHT-sided Port-A-Cath terminates in the distal superior vena cava. Heart size is stable with extensive 3 vessel coronary artery calcifications. No pericardial effusion. Generalized aortic atherosclerosis without aneurysmal dilation. Normal caliber of the central pulmonary vasculature. Limited assessment of cardiovascular structures given lack of intravenous contrast. Still with mid esophageal thickening. Basilar atelectasis.  Airways are patent. ABDOMEN/PELVIS: No abnormal hypermetabolic activity within the liver, pancreas, adrenal glands, or spleen. No hypermetabolic lymph nodes in the abdomen or pelvis.  Incidental CT findings: Liver and gallbladder unremarkable on noncontrast imaging. Pancreas normal. Spleen normal size. Adrenal glands are unremarkable. No hydronephrosis or nephrolithiasis. G-tube in situ. No acute small bowel process. Postoperative changes in the proximal small bowel with  suture lines reflecting prior small-bowel resection. Colonic diverticulosis. Aortic atherosclerosis without aneurysm. No pelvic adenopathy by size criteria.  Post hysterectomy. SKELETON: No focal hypermetabolic activity to suggest skeletal metastasis. There is a discrete focus of increased FDG uptake in the patient's abdominal wall beneath the fold of the pannus (image 180, series 4) there there is subtle stranding in this location. No focal, discrete measurable lesion though the area involves approximately 5 mm with a maximum SUV of 8.4. Incidental CT findings: none IMPRESSION: 1. Diminished metabolic activity within the area of concern in the distal esophagus still with approximately 4 cm craniocaudal thickening in the middle third of the esophagus. 2. No signs of distant disease. 3. Area of increased metabolic activity within the crease along the lower margin of the pannus, this may represent inflammation though with a maximum SUV near that of the primary tumor would suggest direct clinical inspection to exclude focal inflammation or skin lesion. Aortic Atherosclerosis (ICD10-I70.0). Electronically Signed   By: Zetta Bills M.D.   On: 09/27/2019 09:57   DG ABDOMEN PEG TUBE LOCATION  Result Date: 10/15/2019 CLINICAL DATA:  Evaluate feeding jejunostomy catheter. EXAM: ABDOMEN - 1 VIEW COMPARISON:  None. FINDINGS: Contrast has been injected via a jejunostomy catheter demonstrating opacification of several loops of small bowel. Paucity of bowel gas without evidence of enteric obstruction. Enteric tube tip projects over the left upper abdominal quadrant. No acute osseous abnormalities. IMPRESSION: Contrast injection of  feeding jejunostomy catheter opacifies several loops of small bowel suggesting appropriate positioning and functionality. Electronically Signed   By: Sandi Mariscal M.D.   On: 10/15/2019 13:49   DG CHEST PORT 1 VIEW  Result Date: 10/24/2019 CLINICAL DATA:  Intubation EXAM: PORTABLE CHEST 1 VIEW COMPARISON:  Earlier today FINDINGS: Endotracheal tube with tip just below the clavicular heads. Porta catheter on the right with tip at the right atrium. Left PICC in unremarkable position. Tunneled pleural catheter on the right with lateral loculated appearing pleural based opacity. Diffuse interstitial coarsening. No pneumothorax seen. Normal heart size. IMPRESSION: 1. New endotracheal tube in unremarkable position. 2. Pulmonary and right pleural opacity (likely loculated) without change from earlier today. Electronically Signed   By: Monte Fantasia M.D.   On: 10/24/2019 07:18   DG Chest Port 1 View  Result Date: 10/24/2019 CLINICAL DATA:  Pneumothorax with sudden drop in oxygen saturation. EXAM: PORTABLE CHEST 1 VIEW COMPARISON:  10/21/2019 FINDINGS: Power port type central venous catheter with tip over the low SVC region. Left PICC line with tip over the cavoatrial junction region. Right chest tube. No significant residual pneumothorax. Significant interval increase in bilateral diffuse airspace and interstitial infiltrates consistent with edema or aspiration. Pneumonia would be a less likely possibility. Increasing consolidation or loculated fluid in the right lung base laterally. Cardiac enlargement. Calcification of the aorta. IMPRESSION: Significant interval increase in bilateral diffuse airspace and interstitial infiltrates most consistent with edema or aspiration. Increasing consolidation or loculated fluid in the right lung base laterally. Electronically Signed   By: Lucienne Capers M.D.   On: 10/24/2019 05:37   DG CHEST PORT 1 VIEW  Result Date: 10/21/2019 CLINICAL DATA:  Shortness of breath. EXAM:  PORTABLE CHEST 1 VIEW COMPARISON:  October 20, 2019. FINDINGS: Stable cardiomediastinal silhouette. Right subclavian Port-A-Cath is unchanged. Right-sided chest tube is noted without pneumothorax. Stable diffuse left lung densities are noted concerning for possible inflammation. Small left pleural effusion may be present. Minimal right pleural effusion is noted. Minimal right basilar subsegmental atelectasis is noted.  Bony thorax is unremarkable. IMPRESSION: Stable diffuse left lung densities are noted concerning for possible inflammation. Small left pleural effusion may be present. Minimal right basilar subsegmental atelectasis is noted. Aortic Atherosclerosis (ICD10-I70.0). Electronically Signed   By: Marijo Conception M.D.   On: 10/21/2019 07:44   DG CHEST PORT 1 VIEW  Result Date: 10/20/2019 CLINICAL DATA:  Status post esophagectomy EXAM: PORTABLE CHEST 1 VIEW COMPARISON:  October 20, 2019 study obtained earlier in the day FINDINGS: Chest tube unchanged in position on the right. Right basilar pneumothorax is smaller compared to earlier in the day. No tension component. Subcutaneous air present on the right. There is extensive airspace opacity throughout the left upper lobe and left perihilar regions, similar to earlier in the day. There is patchy atelectasis in the right mid lung and right base with suspected loculated pleural effusion on the right laterally. No new opacity evident. Port-A-Cath tip is in the superior vena cava. Heart size and pulmonary vascularity are within normal limits. No adenopathy is appreciable. There is aortic atherosclerosis. No bone lesions. IMPRESSION: Right base pneumothorax smaller compared to earlier in the day. No tension component. No change in tube and catheter positions. Extensive airspace opacity left upper lobe and left perihilar regions, likely pneumonia, although aspiration could present in this manner. No new opacity. Stable cardiac silhouette. Aortic Atherosclerosis  (ICD10-I70.0). Electronically Signed   By: Lowella Grip III M.D.   On: 10/20/2019 08:36   DG Chest Port 1 View  Result Date: 10/20/2019 CLINICAL DATA:  Respiratory distress EXAM: PORTABLE CHEST 1 VIEW COMPARISON:  10/17/2019 FINDINGS: Pulmonary insufflation has improved slightly. Right chest tube is unchanged in position. Small right basilar pneumothorax, however, has increased in size since prior examination and there has now developed a small amount of laterally loculated pleural fluid. Extensive airspace infiltrate has now developed throughout the left upper lobe, likely infectious in the appropriate clinical setting. Right subclavian chest port with its tip within the superior vena cava and left medial apical probable Penrose surgical drain are unchanged. Cardiac size within normal limits. Pulmonary vascularity is normal. No acute bone abnormality. IMPRESSION: Interval development of small right basilar pneumothorax and loculated pleural fluid. Correlation for appropriate function of the indwelling right chest tube is recommended. Interval development of extensive airspace infiltrate within the left upper lobe suspicious for developing acute lobar pneumonia. These results will be called to the ordering clinician or representative by the Radiologist Assistant, and communication documented in the PACS or [employer]. Electronically Signed   By: Fidela Salisbury MD   On: 10/20/2019 01:01   DG CHEST PORT 1 VIEW  Result Date: 10/16/2019 CLINICAL DATA:  Shortness of breath EXAM: PORTABLE CHEST 1 VIEW COMPARISON:  Chest radiograph earlier same date FINDINGS: Enteric tube courses inferior to the diaphragm. Port-A-Cath tip projects over the superior vena cava. Probable surgical drain medial left upper hemithorax. Stable cardiac and mediastinal contours. Similar bibasilar airspace opacities with small bilateral pleural effusions. Small right apical pneumothorax. Small amount of subcutaneous emphysema  overlying the right lateral chest wall. Similar position right chest tube. IMPRESSION: Small right apical pneumothorax.  Right chest tube in position. Similar bibasilar opacities and small bilateral pleural effusions. Electronically Signed   By: Lovey Newcomer M.D.   On: 10/16/2019 13:53   DG CHEST PORT 1 VIEW  Result Date: 10/15/2019 CLINICAL DATA:  Post esophagectomy. EXAM: PORTABLE CHEST 1 VIEW COMPARISON:  10/07/2019; 10/11/2019 FINDINGS: Grossly unchanged cardiac silhouette and mediastinal contours. Stable positioning of support apparatus  with suspected development of a tiny/small right apical pneumothorax. The amount of right lateral chest wall subcutaneous emphysema is grossly unchanged. Curvilinear opacity overlying the left thoracic inlet is unchanged and again may represent a surgical drainage catheter. Slight worsening of bilateral medial basilar opacities favored to represent atelectasis. No pleural effusion, though note, the left costophrenic angle is excluded from view. No evidence of edema. No acute osseous abnormalities. IMPRESSION: 1. Stable positioning of support apparatus with suspected development of a tiny/small right apical pneumothorax. 2. Worsening bilateral medial basilar opacities, likely atelectasis. 3. Redemonstrated curvilinear opacity overlying the left thoracic inlet potentially representative of a surgical drain. Clinical correlation is advised. Electronically Signed   By: Sandi Mariscal M.D.   On: 10/15/2019 13:48   DG Chest Port 1 View  Result Date: 10/10/2019 CLINICAL DATA:  Status post esophagectomy EXAM: PORTABLE CHEST 1 VIEW COMPARISON:  10/11/2019 FINDINGS: Gastric catheter is noted extending below the diaphragm. Right-sided chest wall port is again seen. Right chest tube is noted in place with subcutaneous emphysema. No definitive pneumothorax is noted at this time. Cardiac shadow is stable. Aortic calcifications are seen. Minimal scarring in the left base is seen. No bony  abnormality is noted. There is a somewhat linear density identified just to the left of the gastric catheter and extending towards the left clavicle. This may represent a surgical drain. Correlation with physical findings is recommended. IMPRESSION: Postsurgical changes are seen. No pneumothorax is noted. Curvilinear radiopacity along the left chest extending superiorly. This may represent a local drain. Correlation with the physical findings is recommended. Electronically Signed   By: Inez Catalina M.D.   On: 10/21/2019 19:11   [place] EKG SITE RITE  Result Date: 10/21/2019 If Site Rite image not attached, placement could not be confirmed due to current cardiac rhythm.  DG ESOPHAGUS W SINGLE CM (SOL OR THIN BA)  Result Date: 10/18/2019 CLINICAL DATA:  History of prior esophagectomy and gastric pull-through, evaluate for leak EXAM: ESOPHOGRAM/BARIUM SWALLOW TECHNIQUE: Single contrast examination was performed using Omnipaque and subsequently thin barium. FLUOROSCOPY TIME:  Fluoroscopy Time:  3 minutes 18 seconds Radiation Exposure Index (if provided by the fluoroscopic device): 263.9 mGy Number of Acquired Spot Images: 54, multiple cine fluoroscopic runs COMPARISON:  None. FINDINGS: Swallowing mechanism is within normal limits. There are changes consistent with esophagectomy and gastric pull-through. The cervical anastomosis is widely patent. Nasogastric catheter is noted in place. No evidence of esophageal leak is identified. Some caliber change at the proximal anastomosis is seen. The gastric conduit is well visualized and contrast material passes into the distal stomach and subsequently through the pylorus into the small bowel. There is relative pooling within the gastric conduit likely related to the relatively recumbent positioning. Patient was imaged at 16 degrees and 23 degrees. Fall upright imaging could not be performed due to the patient's condition. Subsequently thin barium was utilized to evaluate  the esophagus which again demonstrated no evidence of leak. 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 No evidence of anastomotic leak is noted in the upper chest. The conduit does empty into the small bowel although in a somewhat slowed manner although this would likely be improved with more upright positioning. The pylorus does appears somewhat lower than the diaphragmatic hiatus on these images. The pyloroplasty is widely patent. Electronically Signed   By: Inez Catalina M.D.   On: 10/18/2019 16:22    Labs:  CBC: Recent Labs    10/21/19 0313 10/21/19 0313 10/21/19 1004 10/22/19  0200 10/23/19 0350 10/24/19 0337  WBC 13.9*  --   --  19.6* 21.8* 13.6*  HGB 11.3*   < > 11.2* 10.4* 9.7* 8.1*  HCT 35.9*   < > 33.0* 32.7* 30.7* 27.0*  PLT 241  --   --  296 272 205   < > = values in this interval not displayed.    COAGS: Recent Labs    06/12/19 0020 06/12/19 0244 10/11/19 1000 10/10/2019 0653  INR 1.0  --  1.3*  --   APTT  --  30 >200* 29    BMP: Recent Labs    06/12/19 0610 06/12/19 0610 06/13/19 0329 06/13/19 0329 06/14/19 0401 06/14/19 0401 07/12/19 0854 10/11/19 1000 10/21/19 1530 10/22/19 0200 10/23/19 0350 10/24/19 0337  NA 133*   < > 139   < > 138  --  137   < > 142 143 140 146*  K 3.8   < > 3.6   < > 3.3*   < > 4.8   < > 3.5 3.3* 4.0 3.1*  CL 101   < > 107   < > 106   < > 97   < > 103 104 103 106  CO2 24   < > 23   < > 23   < > 25   < > 25 25 26 29   GLUCOSE 135*   < > 107*   < > 100*  --  137*   < > 163* 188* 223* 174*  BUN 18   < > 9   < > 9  --  15   < > 31* 31* 34* 31*  CALCIUM 8.0*   < > 8.2*   < > 8.1*   < > 9.6   < > 8.2* 8.1* 7.6* 8.2*  CREATININE 1.19*   < > 0.96   < > 0.89   < > 0.93   < > 1.12* 1.20* 1.11* 0.88  GFRNONAA 47*   < > >60   < > >60   < > 63   < > 50* 46* 50* >60  GFRAA 54*  --  >60  --  >60  --  73  --   --   --   --   --    < > = values in this interval not displayed.    LIVER FUNCTION TESTS: Recent  Labs    10/21/19 0313 10/22/19 0200 10/23/19 0350 10/24/19 0337  BILITOT 1.3* 1.1 0.9 0.5  AST 22 21 19 18   ALT 28 24 18 15   ALKPHOS 90 86 72 54  PROT 6.1* 5.9* 5.1* 5.4*  ALBUMIN 2.1* 1.9* 1.6* 2.5*   Assessment and Plan:  69 y.o. History of esophageal cancer, DM, HTN. Admitted for bronch, esophagectomy, jejunostomy  on 10.8.21. Now with a cervical anastomotic leak  in acute respiratory failure intubated this morning. Chest xray from 10.18.1 shows alright sided pleural opacity. Team is requesting a right sided chest tube for loculation pleural effusion.  Eliquis last given on 10.17.21 @ 22:32. COVID test was on 10.5.21 for preadmission for bronch. WBC is 13.6, potasium 3.1.  IR consulted for possible right sided chest tube placement. Case has been reviewed and procedure approved by Dr. Earleen Newport.  Patient tentatively scheduled for 10.18.21.  Team instructed to: Continue to hold Eliquis IR will call patient when ready.  Risks and benefits discussed with the patient's daughter  including bleeding, infection, damage to adjacent structures, bowel perforation/fistula connection, and sepsis.  All of the patient's daughter's questions were answered, patient is agreeable to proceed.  Consent signed and in IR control room.   Thank you for this interesting consult.  I greatly enjoyed meeting [name] and look forward to participating in their care.  A copy of this report was sent to the requesting provider on this date.  Electronically Signed: Jacqualine Mau, NP 10/24/2019, 12:03 PM   I spent a total of 40 Minutes  in face to face in clinical consultation, greater than 50% of which was counseling/coordinating care for right sided chest tube placement

## 2019-10-25 DIAGNOSIS — J869 Pyothorax without fistula: Secondary | ICD-10-CM | POA: Diagnosis not present

## 2019-10-25 DIAGNOSIS — Z9049 Acquired absence of other specified parts of digestive tract: Secondary | ICD-10-CM

## 2019-10-25 DIAGNOSIS — Z9889 Other specified postprocedural states: Secondary | ICD-10-CM

## 2019-10-25 DIAGNOSIS — J9601 Acute respiratory failure with hypoxia: Secondary | ICD-10-CM | POA: Diagnosis not present

## 2019-10-25 LAB — CBC
HCT: 28.4 % — ABNORMAL LOW (ref 36.0–46.0)
Hemoglobin: 8.4 g/dL — ABNORMAL LOW (ref 12.0–15.0)
MCH: 28.9 pg (ref 26.0–34.0)
MCHC: 29.6 g/dL — ABNORMAL LOW (ref 30.0–36.0)
MCV: 97.6 fL (ref 80.0–100.0)
Platelets: 268 10*3/uL (ref 150–400)
RBC: 2.91 MIL/uL — ABNORMAL LOW (ref 3.87–5.11)
RDW: 14.7 % (ref 11.5–15.5)
WBC: 15.3 10*3/uL — ABNORMAL HIGH (ref 4.0–10.5)
nRBC: 0 % (ref 0.0–0.2)

## 2019-10-25 LAB — COMPREHENSIVE METABOLIC PANEL
ALT: 14 U/L (ref 0–44)
AST: 16 U/L (ref 15–41)
Albumin: 2.4 g/dL — ABNORMAL LOW (ref 3.5–5.0)
Alkaline Phosphatase: 60 U/L (ref 38–126)
Anion gap: 12 (ref 5–15)
BUN: 33 mg/dL — ABNORMAL HIGH (ref 8–23)
CO2: 29 mmol/L (ref 22–32)
Calcium: 8.2 mg/dL — ABNORMAL LOW (ref 8.9–10.3)
Chloride: 106 mmol/L (ref 98–111)
Creatinine, Ser: 0.93 mg/dL (ref 0.44–1.00)
GFR, Estimated: >60 mL/min (ref >60)
Glucose, Bld: 204 mg/dL — ABNORMAL HIGH (ref 70–99)
Potassium: 3.3 mmol/L — ABNORMAL LOW (ref 3.5–5.1)
Sodium: 147 mmol/L — ABNORMAL HIGH (ref 135–145)
Total Bilirubin: 0.6 mg/dL (ref 0.3–1.2)
Total Protein: 5.8 g/dL — ABNORMAL LOW (ref 6.5–8.1)

## 2019-10-25 LAB — GLUCOSE, CAPILLARY
Glucose-Capillary: 120 mg/dL — ABNORMAL HIGH (ref 70–99)
Glucose-Capillary: 128 mg/dL — ABNORMAL HIGH (ref 70–99)
Glucose-Capillary: 147 mg/dL — ABNORMAL HIGH (ref 70–99)
Glucose-Capillary: 174 mg/dL — ABNORMAL HIGH (ref 70–99)
Glucose-Capillary: 187 mg/dL — ABNORMAL HIGH (ref 70–99)
Glucose-Capillary: 218 mg/dL — ABNORMAL HIGH (ref 70–99)

## 2019-10-25 LAB — CULTURE, RESPIRATORY W GRAM STAIN

## 2019-10-25 LAB — TRIGLYCERIDES: Triglycerides: 313 mg/dL — ABNORMAL HIGH (ref <150)

## 2019-10-25 LAB — PHOSPHORUS: Phosphorus: 3.8 mg/dL (ref 2.5–4.6)

## 2019-10-25 LAB — MAGNESIUM: Magnesium: 2.1 mg/dL (ref 1.7–2.4)

## 2019-10-25 MED ORDER — POTASSIUM CHLORIDE 20 MEQ/15ML (10%) PO SOLN
20.0000 meq | ORAL | Status: AC
  Administered 2019-10-25 (×3): 20 meq
  Filled 2019-10-25 (×3): qty 15

## 2019-10-25 MED ORDER — DEXTROSE 10 % IV SOLN: INTRAVENOUS | Status: DC

## 2019-10-25 MED ORDER — SIMETHICONE 40 MG/0.6ML PO SUSP
40.0000 mg | Freq: Four times a day (QID) | ORAL | Status: DC | PRN
  Administered 2019-10-25: 40 mg
  Filled 2019-10-25 (×3): qty 0.6

## 2019-10-25 MED ORDER — DEXMEDETOMIDINE HCL IN NACL 400 MCG/100ML IV SOLN
0.0000 ug/kg/h | INTRAVENOUS | Status: AC
  Administered 2019-10-25: 1.2 ug/kg/h via INTRAVENOUS
  Administered 2019-10-25: 0.4 ug/kg/h via INTRAVENOUS
  Administered 2019-10-25: 0.9 ug/kg/h via INTRAVENOUS
  Administered 2019-10-26 – 2019-10-28 (×11): 1.2 ug/kg/h via INTRAVENOUS
  Filled 2019-10-25 (×2): qty 100
  Filled 2019-10-25: qty 200
  Filled 2019-10-25 (×12): qty 100

## 2019-10-25 MED ORDER — INSULIN DETEMIR 100 UNIT/ML ~~LOC~~ SOLN
10.0000 [IU] | Freq: Every day | SUBCUTANEOUS | Status: DC
  Administered 2019-10-25 – 2019-10-28 (×4): 10 [IU] via SUBCUTANEOUS
  Filled 2019-10-25 (×5): qty 0.1

## 2019-10-25 MED ORDER — APIXABAN 5 MG PO TABS
5.0000 mg | ORAL_TABLET | Freq: Two times a day (BID) | ORAL | Status: DC
  Administered 2019-10-25 – 2019-10-30 (×11): 5 mg
  Filled 2019-10-25 (×12): qty 1

## 2019-10-25 MED ORDER — LACTATED RINGERS IV BOLUS
500.0000 mL | Freq: Once | INTRAVENOUS | Status: AC
  Administered 2019-10-25: 500 mL via INTRAVENOUS

## 2019-10-25 NOTE — Progress Notes (Signed)
Referring Physician(s): Kipp Brood, Lucile Crater (TCTS)  Supervising Physician: Aletta Edouard  Patient Status:  [facility] - In-pt  Chief Complaint: Chest pain  Subjective:  History of esophageal squamous cancer s/p bronchoscopy, robotic assisted laparoscopy and thoracoscopy, Mckeown esophagectomy, pyloroplasty, and right chest tube placement in OR 11/0/3159; complicated by development of surgical anastomotic leak secondary to loculated right pleural effusion s/p right chest tube placement in IR 10/24/2019. Patient laying in bed intubated with sedation. She opens eyes to voice and nods head appropriately to yes/no questions. Nods yes and points to chest when asked if she is experiencing pain anywhere. Right chest tube (IR tube, anterior placement) site c/d/i. Sating 95% with ETT.   Allergies: Honey bee venom protein [bee venom], Ether, Nickel, Other, and Tape  Medications: Prior to Admission medications   Medication Sig Start Date End Date Taking? Authorizing Provider  Amino Acids-Protein Hydrolys (FEEDING SUPPLEMENT, PRO-STAT SUGAR FREE 64,) LIQD Place 30 mLs into feeding tube 2 (two) times daily. Patient taking differently: Place 30 mLs into feeding tube in the morning, at noon, in the evening, and at bedtime.  06/14/19  Yes Lavina Hamman, MD  aspirin EC 81 MG tablet Take 1 tablet (81 mg total) by mouth daily. 12/22/18  Yes Tobb, Kardie, DO  BREO ELLIPTA 100-25 MCG/INH AEPB Inhale 1 puff into the lungs at bedtime.  08/18/18  Yes [provider]  Calcium Carbonate-Vitamin D (CALCIUM 600/VITAMIN D PO) Take 1 tablet by mouth daily.   Yes [provider]  EPINEPHrine 0.3 mg/0.3 mL IJ SOAJ injection Inject 0.3 mg into the muscle as needed for anaphylaxis. 11/14/18  Yes [provider]  FLUoxetine (PROZAC) 20 MG capsule Take 20 mg by mouth daily. Pt currently not taking, as she feels she doesn't needs them at this time.   Yes [provider]  gabapentin  (NEURONTIN) 300 MG capsule Take 300 mg by mouth 2 (two) times daily.  08/19/18  Yes [provider]  isosorbide mononitrate (IMDUR) 30 MG 24 hr tablet TAKE 1 TABLET BY MOUTH  DAILY Patient taking differently: Take 30 mg by mouth daily.  08/26/19  Yes Tobb, Kardie, DO  levothyroxine (SYNTHROID) 75 MCG tablet Take 75 mcg by mouth daily before breakfast.  08/23/18  Yes [provider]  metoprolol tartrate (LOPRESSOR) 25 MG tablet Take 0.5 tablets (12.5 mg total) by mouth 2 (two) times daily. 07/12/19 11/02/2019 Yes Tobb, Kardie, DO  mometasone (NASONEX) 50 MCG/ACT nasal spray Place 2 sprays into the nose daily as needed (pain).    Yes [provider]  nitroGLYCERIN (NITROSTAT) 0.4 MG SL tablet Place 1 tablet (0.4 mg total) under the tongue every 5 (five) minutes as needed for chest pain. 07/12/19 10/10/19 Yes Tobb, Kardie, DO  oxyCODONE (OXY IR/ROXICODONE) 5 MG immediate release tablet Take 10 mg by mouth every 4 (four) hours as needed for moderate pain.  09/27/19  Yes [provider]  pantoprazole (PROTONIX) 40 MG tablet Take 40 mg by mouth daily.  10/14/18  Yes [provider]  potassium chloride 20 MEQ/15ML (10%) SOLN Take 20 mEq by mouth daily. 06/22/19  Yes [provider]  promethazine (PHENERGAN) 25 MG tablet Take 25 mg by mouth every 8 (eight) hours as needed for nausea or vomiting.  06/28/19  Yes [provider]  rosuvastatin (CRESTOR) 40 MG tablet TAKE 1 TABLET BY MOUTH  DAILY Patient taking differently: Take 40 mg by mouth daily.  08/26/19  Yes Tobb, Godfrey Pick, DO  Water For  Irrigation, Sterile (FREE WATER) SOLN Place 200 mLs into feeding tube every 8 (eight) hours. 06/14/19  Yes Lavina Hamman, MD  Ascorbic Acid (VITAMIN C) 100 MG tablet Take 100 mg by mouth daily. Patient not taking: Reported on 10/03/2019    [provider]  oxyCODONE-acetaminophen (PERCOCET) 10-325 MG tablet Take 1 tablet by mouth every 4 (four) hours as needed for  pain. Patient not taking: Reported on 10/03/2019    [provider]  rifaximin (XIFAXAN) 550 MG TABS tablet Take 550 mg by mouth See admin instructions. Take 1 tablet (550 mg) by mouth twice daily for 2 weeks, hold for 4 months then resume cycle Patient not taking: Reported on 10/03/2019    [provider]  sucralfate (CARAFATE) 1 GM/10ML suspension Take 10 mLs (1 g total) by mouth 4 (four) times daily -  with meals and at bedtime. Patient not taking: Reported on 10/03/2019 06/14/19   Lavina Hamman, MD     Vital Signs: BP (!) 142/63   Pulse 97   Temp 98 F (36.7 C) (Axillary)   Resp 16   Ht 5\' 2"  (1.575 m)   Wt 158 lb 4.6 oz (71.8 kg)   SpO2 96%   BMI 28.95 kg/m   Physical Exam Vitals and nursing note reviewed.  Constitutional:      General: She is not in acute distress.    Appearance: She is ill-appearing.     Comments: Intubated with sedation.  Pulmonary:     Effort: Pulmonary effort is normal. No respiratory distress.     Comments: Intubated with sedation. (+) right chest tube (TCTS placement). Right chest tube (IR tube, anterior placement) site without erythema, drainage, or active bleeding; approximately 130 cc serous fluid in pleure-vac; tube to suction. Skin:    General: Skin is warm and dry.  Neurological:     Comments: Eyes open to voice. Nods head appropriately to yes/no questions.     Imaging: DG CHEST PORT 1 VIEW  Result Date: 10/24/2019 CLINICAL DATA:  Intubation EXAM: PORTABLE CHEST 1 VIEW COMPARISON:  Earlier today FINDINGS: Endotracheal tube with tip just below the clavicular heads. Porta catheter on the right with tip at the right atrium. Left PICC in unremarkable position. Tunneled pleural catheter on the right with lateral loculated appearing pleural based opacity. Diffuse interstitial coarsening. No pneumothorax seen. Normal heart size. IMPRESSION: 1. New endotracheal tube in unremarkable position. 2. Pulmonary and right pleural opacity  (likely loculated) without change from earlier today. Electronically Signed   By: Monte Fantasia M.D.   On: 10/24/2019 07:18   DG Chest Port 1 View  Result Date: 10/24/2019 CLINICAL DATA:  Pneumothorax with sudden drop in oxygen saturation. EXAM: PORTABLE CHEST 1 VIEW COMPARISON:  10/21/2019 FINDINGS: Power port type central venous catheter with tip over the low SVC region. Left PICC line with tip over the cavoatrial junction region. Right chest tube. No significant residual pneumothorax. Significant interval increase in bilateral diffuse airspace and interstitial infiltrates consistent with edema or aspiration. Pneumonia would be a less likely possibility. Increasing consolidation or loculated fluid in the right lung base laterally. Cardiac enlargement. Calcification of the aorta. IMPRESSION: Significant interval increase in bilateral diffuse airspace and interstitial infiltrates most consistent with edema or aspiration. Increasing consolidation or loculated fluid in the right lung base laterally. Electronically Signed   By: Lucienne Capers M.D.   On: 10/24/2019 05:37   CT [facility] PLEURAL DRAIN W/INDWELL CATH W/IMG GUIDE  Result Date: 10/24/2019 INDICATION: 69 year old female  referred for pigtail catheter placement into a loculated right pleural fluid collection EXAM: IMAGE GUIDED PLACEMENT OF PIGTAIL DRAINAGE CATHETER INTO THE RIGHT PLEURAL SPACE MEDICATIONS: The patient is currently admitted to the hospital and receiving intravenous antibiotics. The antibiotics were administered within an appropriate time frame prior to the initiation of the procedure. ANESTHESIA/SEDATION: No sedation COMPLICATIONS: None PROCEDURE: Informed written consent was obtained from the patient after a thorough discussion of the procedural risks, benefits and alternatives. All questions were addressed. Maximal Sterile Barrier Technique was utilized including caps, mask, sterile gowns, sterile gloves, sterile drape, hand hygiene  and skin antiseptic. A timeout was performed prior to the initiation of the procedure. Patient was positioned supine position on CT gantry table. Scout CT was acquired for planning purposes. The anterior right chest was prepped and draped in the usual sterile fashion. 1% lidocaine was used for local anesthesia. Using CT guidance, a Yueh needle was advanced into the right pleural space from a right anterior approach, targeting loculated fluid in the lateral right chest. Using modified Seldinger technique, 10 French pigtail drainage catheter was placed into the pleural space. Catheter was sutured in position and attached to a pleura vac system. Sample of turbid yellow fluid was sent for culture. We confirmed operational chest tube with wall suction on the pleura vac and secured the drain in place. Patient tolerated the procedure well and remained hemodynamically stable throughout. No complications were encountered and no significant blood loss. IMPRESSION: Status post CT-guided placement of pigtail drainage catheter into the right loculated pleural fluid. Signed, Dulcy Fanny. Dellia Nims, RPVI [facility] Radiology Electronically Signed   By: Corrie Mckusick D.O.   On: 10/24/2019 16:51    Labs:  CBC: Recent Labs    10/22/19 0200 10/23/19 0350 10/24/19 0337 10/25/19 0333  WBC 19.6* 21.8* 13.6* 15.3*  HGB 10.4* 9.7* 8.1* 8.4*  HCT 32.7* 30.7* 27.0* 28.4*  PLT 296 272 205 268    COAGS: Recent Labs    06/12/19 0020 06/12/19 0244 10/11/19 1000 11/02/2019 0653  INR 1.0  --  1.3*  --   APTT  --  30 >200* 29    BMP: Recent Labs    06/12/19 0610 06/12/19 0610 06/13/19 0329 06/13/19 0329 06/14/19 0401 06/14/19 0401 07/12/19 0854 10/11/19 1000 10/22/19 0200 10/23/19 0350 10/24/19 0337 10/25/19 0333  NA 133*   < > 139   < > 138  --  137   < > 143 140 146* 147*  K 3.8   < > 3.6   < > 3.3*   < > 4.8   < > 3.3* 4.0 3.1* 3.3*  CL 101   < > 107   < >  106   < > 97   < > 104 103 106 106  CO2 24   < > 23   < > 23   < > 25   < > 25 26 29 29   GLUCOSE 135*   < > 107*   < > 100*  --  137*   < > 188* 223* 174* 204*  BUN 18   < > 9   < > 9  --  15   < > 31* 34* 31* 33*  CALCIUM 8.0*   < > 8.2*   < > 8.1*   < > 9.6   < > 8.1* 7.6* 8.2* 8.2*  CREATININE 1.19*   < > 0.96   < > 0.89   < >  0.93   < > 1.20* 1.11* 0.88 0.93  GFRNONAA 47*   < > >60   < > >60   < > 63   < > 46* 50* >60 >60  GFRAA 54*  --  >60  --  >60  --  73  --   --   --   --   --    < > = values in this interval not displayed.    LIVER FUNCTION TESTS: Recent Labs    10/22/19 0200 10/23/19 0350 10/24/19 0337 10/25/19 0333  BILITOT 1.1 0.9 0.5 0.6  AST 21 19 18 16   ALT 24 18 15 14   ALKPHOS 86 72 54 60  PROT 5.9* 5.1* 5.4* 5.8*  ALBUMIN 1.9* 1.6* 2.5* 2.4*    Assessment and Plan:  History of esophageal squamous cancer s/p bronchoscopy, robotic assisted laparoscopy and thoracoscopy, Mckeown esophagectomy, pyloroplasty, and right chest tube placement in OR 03/13/962; complicated by development of surgical anastomotic leak secondary to loculated right pleural effusion s/p right chest tube placement in IR 10/24/2019. Right chest tube (IR tube, anterior placement) stable with approximately 130 cc serous fluid in pleure-vac. Continue current tube management- tube to remain to suction at this time. Further plans per TCTS/CCM- appreciate and agree with management. IR to follow.   Electronically Signed: Earley Abide, PA-C 10/25/2019, 10:57 AM   I spent a total of 25 Minutes at the the patient's bedside AND on the patient's hospital floor or unit, greater than 50% of which was counseling/coordinating care for loculated right pleural effusion s/p right chest tube placement.

## 2019-10-25 NOTE — Progress Notes (Signed)
NAME:  [name], MRN:  [id number], DOB:  November 06, 1950, LOS: 53 ADMISSION DATE:  10/13/2019, CONSULTATION DATE: 10/20/2019 REFERRING MD:  Dr. Kipp Brood, CHIEF COMPLAINT:  SOB   Brief History   [name] is a 69 y/o female with a PMHx of esophageal squamous cell carcinoma admitted on 10/08 for esophagectomy with interval development of respiratory distress requiring BiPAP and transfer to ICU.   History of present illness   [name] presented to [facility] for admission for elective esophagectomy on 10/08 that proceeded without any complications. She is currently POD 6.   Overnight, patient developed worsening work of breathing requiring NRB. She was noted to be diaphoretic with decreased lung sounds by the RN. Rapid response was called, who switched chest tube from water seal to suction. Air leak noted in [place]. CXR obtained overnight showed small right basilar pneumothorax with loculated pleural effusion, as well as interval development of extensive airspace infiltrate in the LUL. Repeat CXR this morning shows improvement in pneumothorax with no change in opacities. In the late morning, patient developed worsening respiratory distress requiring BiPAP placement and transfer to ICU for close monitoring.  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 Fib,   Significant Hospital Events   10/08 >> Admitted to [facility] 10/14 >> Transferred to ICU for respiratory distress   Consults:  PCCM  Procedures:  10/08 >> Esophagectomy 10/15 >> ETT placed, Bronchoscopy     Significant Diagnostic Tests:  CXR 10/13 >> Small right basilar pneumothorax, however, has increased in size since prior examination and there has now developed a small amount of laterally loculated pleural fluid. Extensive airspace infiltrate has now developed throughout the left upper lobe, likely infectious in the appropriate clinical setting.   CXR 10/14  >> Right basilar  pneumothorax is smaller compared to earlier in the day. No tension component. Subcutaneous air present on the right. There is extensive airspace opacity throughout the left upper lobe and left perihilar regions, similar to earlier in the day. There is patchy atelectasis in the right mid lung and right base with suspected loculated pleural effusion on the right laterally  Micro Data:  None  Antimicrobials:  Zosyn 10/14 >>   Interim history/subjective:   S/p right chest tube for loculated pleural effusion  Objective   Blood pressure (!) 112/57, pulse 88, temperature 98.6 F (37 C), temperature source Oral, resp. rate (!) 27, height 5\' 2"  (1.575 m), weight 71.8 kg, SpO2 96 %.    Vent Mode: PRVC FiO2 (%):  [50 %-100 %] 50 % Set Rate:  [26 bmp] 26 bmp Vt Set:  [300 mL] 300 mL PEEP:  [8 cmH20] 8 cmH20 Pressure Support:  [8 cmH20] 8 cmH20 Plateau Pressure:  [19 cmH20-24 cmH20] 19 cmH20   Intake/Output Summary (Last 24 hours) at 10/25/2019 1615 Last data filed at 10/25/2019 1500 Gross per 24 hour  Intake 1938.87 ml  Output 1714 ml  Net 224.87 ml   Filed Weights   10/23/19 0347 10/24/19 0500 10/25/19 0500  Weight: 74.6 kg 74.7 kg 71.8 kg   Physical Exam: General: Elderly chronically ill-appearing, no acute distress HENT: , AT, ETT in place Neck: Left neck dressing in place Respiratory: Coarse breath sounds bilaterally.  No crackles, wheezing or rales. Right chest tube in place x 2 Cardiovascular: RRR, -M/R/G, no JVD GI: BS+, soft, nontender, J-tube feeds Extremities:-Edema,-tenderness Neuro: Sedated GU: Foley in place  CBC Latest Ref Rng & Units 10/25/2019 10/24/2019  10/23/2019  WBC 4.0 - 10.5 K/uL 15.3(H) 13.6(H) 21.8(H)  Hemoglobin 12.0 - 15.0 g/dL 8.4(L) 8.1(L) 9.7(L)  Hematocrit 36 - 46 % 28.4(L) 27.0(L) 30.7(L)  Platelets 150 - 400 K/uL 268 205 272   CMP Latest Ref Rng & Units 10/25/2019 10/24/2019 10/23/2019  Glucose 70 - 99 mg/dL 204(H) 174(H) 223(H)  BUN 8 - 23  mg/dL 33(H) 31(H) 34(H)  Creatinine 0.44 - 1.00 mg/dL 0.93 0.88 1.11(H)  Sodium 135 - 145 mmol/L 147(H) 146(H) 140  Potassium 3.5 - 5.1 mmol/L 3.3(L) 3.1(L) 4.0  Chloride 98 - 111 mmol/L 106 106 103  CO2 22 - 32 mmol/L 29 29 26   Calcium 8.9 - 10.3 mg/dL 8.2(L) 8.2(L) 7.6(L)  Total Protein 6.5 - 8.1 g/dL 5.8(L) 5.4(L) 5.1(L)  Total Bilirubin 0.3 - 1.2 mg/dL 0.6 0.5 0.9  Alkaline Phos 38 - 126 U/L 60 54 72  AST 15 - 41 U/L 16 18 19   ALT 0 - 44 U/L 14 15 18    CXR 10/24/19 Loculated R pleural effusion, ETT in place, right chest tube  Resolved Hospital Problem list   AKI  Assessment & Plan:  # Acute hypoxemic and hypercarbic Respiratory Failure  # Aspiration Pneumonia/Pseudomonas Aeruginosa HCAP # Pneumothorax (R): Resolved with tube thoracostomy # Hx of Asthma  -Wean vent settings for goal SpO2 88-95% or PO2 55-80. Will discuss with primary and family regarding trach plan -PAD protocol: Change from propofol to precedex. Wean fentanyl. RASS goal -1 -Chest tube management per primary -Continue Zosyn, follow-up final tracheal aspirate and JP drain culture  #Right GNR empyema -S/p chest tube -Continue Zosyn for four weeks total  #Septic shock secondary to HCAP and empyema -Wean levophed for MAP goal >65 -500cc LR bolus  # Sinus Tachycardia # Hx of Atrial Fibrillation  -Continue amiodarone -Continue eliquis  # Esophageal Squamous Cell Carcinoma s/p Esophagectomy  - Management of incision site per TCTS  - f/u incision fluid culture-->GNR  # Non-obstructive CAD # LAD saccular aneurysm  # HTN  - Home Plavix on hold - Continue aspirin - Home Imdur on hold  - Resume home metoprolol when Bps allow  # Hx of Hypothyroidism  - Continue daily Levothyroxine  # High TG- should resolve off propofol  Best practice:  Diet: Tube Feeds per G Tube  Pain/Anxiety/Delirium protocol (if indicated): wean off VAP protocol (if indicated): Ordered  DVT prophylaxis: full dose lovenox GI  prophylaxis: Protonix Glucose control: SSI Mobility: up to chair Code Status: FULL Family Communication: Per Primary Disposition: ICU  The patient is critically ill with multiple organ systems failure and requires high complexity decision making for assessment and support, frequent evaluation and titration of therapies, application of advanced monitoring technologies and extensive interpretation of multiple databases.  Independent Critical Care Time: 35 Minutes.   Rodman Pickle, M.D. [facility] Pulmonary/Critical Care Medicine 10/25/2019 4:15 PM   Please see Amion for pager number to reach on-call Pulmonary and Critical Care Team.

## 2019-10-25 NOTE — Progress Notes (Signed)
eLink Physician-Brief Progress Note Patient Name: [name] DOB: 01-19-1950 MRN: [id number]   Date of Service  10/25/2019  HPI/Events of Note  Multiple issues: 1. Patient leaking around J tube site and 2. Agitation - Patient pulling at ETT. Patient is on Levemir + Novolog SSI.  eICU Interventions  Plan: 1. Hold tube feeds. 2. J tube to suction or gravity drainage.  3. D10W to run IV at 40 mL/hour. 4. Bilateral soft wrist restraints X 12 hours.      Intervention Category Major Interventions: Delirium, psychosis, severe agitation - evaluation and management;Hyperglycemia - active titration of insulin therapy  Lysle Dingwall 10/25/2019, 10:23 PM

## 2019-10-25 NOTE — Progress Notes (Addendum)
Physical Therapy Treatment/Re-eval Patient Details Name: [name] MRN: [id number] DOB: 1950-10-22 Today's Date: 10/25/2019    History of Present Illness 69 yo with history of esophageal CA admitted for esophagectomy with jejunostomy placement. Additional PMHx: CoPD, Asthma, HTN, HLD, hypothyroidism, CADPt now intubated x 2 and difficult vent wean. VDRF 10/15-10/17.  RE-intubated 10/18 through present.    Pt admitted with above diagnosis. Pt was able to perform bed level re-eval with pt following all commands.  Pt able to move all 4 extremities.  Will maintain same goals and update if needed after PT can mobilize pt.   Pt currently with functional limitations due to balance and endurance deficits. Pt will benefit from skilled PT to increase their independence and safety with mobility to allow discharge to the venue listed below.    PT Comments      Follow Up Recommendations  Home health PT     Equipment Recommendations  None recommended by PT    Recommendations for Other Services       Precautions / Restrictions Precautions Precautions: Fall;Other (comment) Precaution Comments: 2 chest tubes, j tube Restrictions Weight Bearing Restrictions: No    Mobility  Bed Mobility Overal bed mobility: Needs Assistance             General bed mobility comments: Nurse agreed to bed level exercises  Transfers                    Ambulation/Gait                 Stairs             Wheelchair Mobility    Modified Rankin (Stroke Patients Only)       Balance                                            Cognition Arousal/Alertness: Awake/alert Behavior During Therapy: WFL for tasks assessed/performed Overall Cognitive Status: Within Functional Limits for tasks assessed                                        Exercises General Exercises - Upper Extremity Shoulder Flexion: AROM;Both;10 reps;Supine Shoulder  Extension: AROM;Both;10 reps;Supine Shoulder Horizontal ADduction: AROM;Both;10 reps;Supine Elbow Flexion: AROM;Both;10 reps;Supine Elbow Extension: AROM;Both;10 reps;Supine General Exercises - Lower Extremity Ankle Circles/Pumps: AROM;Both;10 reps;Supine Quad Sets: AROM;Both;10 reps;Supine Heel Slides: AROM;Both;15 reps;Supine Hip ABduction/ADduction: AROM;Both;10 reps;Supine Straight Leg Raises: AROM;Both;10 reps;Supine    General Comments General comments (skin integrity, edema, etc.): VSS during exdrcises       Pertinent Vitals/Pain Pain Assessment: No/denies pain    Home Living                      Prior Function            PT Goals (current goals can now be found in the care plan section) Acute Rehab PT Goals Patient Stated Goal: return home Progress towards PT goals: Progressing toward goals    Frequency    Min 3X/week      PT Plan Current plan remains appropriate    Co-evaluation              AM-PAC PT "6 Clicks" Mobility   Outcome Measure  Help needed turning from your  back to your side while in a flat bed without using bedrails?: A Little Help needed moving from lying on your back to sitting on the side of a flat bed without using bedrails?: A Little Help needed moving to and from a bed to a chair (including a wheelchair)?: Total Help needed standing up from a chair using your arms (e.g., wheelchair or bedside chair)?: Total Help needed to walk in hospital room?: Total Help needed climbing 3-5 steps with a railing? : Total 6 Click Score: 10    End of Session   Activity Tolerance: Patient tolerated treatment well;Patient limited by fatigue Patient left: with call bell/phone within reach;in bed;with bed alarm set;with SCD's reapplied;with restraints reapplied Nurse Communication: Mobility status PT Visit Diagnosis: Other abnormalities of gait and mobility (R26.89);Difficulty in walking, not elsewhere classified (R26.2);Muscle weakness  (generalized) (M62.81)     Time: 3435-6861 PT Time Calculation (min) (ACUTE ONLY): 11 min  Charges:  $Therapeutic Exercise: 8-22 mins                     Kunal Levario W,PT Acute Rehabilitation Services Pager:  [phone]  Office:  [place] 10/25/2019, 12:13 PM

## 2019-10-25 NOTE — Progress Notes (Addendum)
[address]       [employer] [address]             [phone]                 11 Days Post-Op Procedure(s) (LRB): XI ROBOTIC ASSISTED [place] ESOPHAGECTOMY USING NIMS (N/A) VIDEO BRONCHOSCOPY (N/A) JEJUNOSTOMY PLACEMENT (N/A) INTERCOSTAL NERVE BLOCK (Right)   Events: No events CT guided drain placed yesterday _______________________________________________________________ Vitals: BP (!) 138/47   Pulse 93   Temp 98 F (36.7 C) (Axillary)   Resp 14   Ht 5\' 2"  (1.575 m)   Wt 71.8 kg   SpO2 95%   BMI 28.95 kg/m   - Neuro: alert NAD  - Cardiovascular: sinus  Drips: amio.      - Pulm:  Vent Mode: CPAP;PSV FiO2 (%):  [50 %-100 %] 50 % Set Rate:  [26 bmp-28 bmp] 26 bmp Vt Set:  [300 mL] 300 mL PEEP:  [8 cmH20] 8 cmH20 Pressure Support:  [8 cmH20] 8 cmH20 Plateau Pressure:  [19 cmH20-24 cmH20] 19 cmH20  ABG    Component Value Date/Time   PHART 7.276 (L) 10/24/2019 1029   PCO2ART 66.8 (HH) 10/24/2019 1029   PO2ART 81.8 (L) 10/24/2019 1029   HCO3 30.0 (H) 10/24/2019 1029   TCO2 31 10/21/2019 1004   ACIDBASEDEF 7.8 (H) 11/04/2019 2050   O2SAT 93.6 10/24/2019 1029    - Abd: soft.  J tube in place - Extremity: warm  .Intake/Output      10/18 0701 - 10/19 0700 10/19 0701 - 10/20 0700   I.V. (mL/kg) 1119.1 (15.6)    Other 140    NG/GT 315    IV Piggyback 122.6    Total Intake(mL/kg) 1696.7 (23.6)    Urine (mL/kg/hr) 1800 (1)    Stool 155    Chest Tube 108    Total Output 2063    Net -366.3            _______________________________________________________________ Labs: CBC Latest Ref Rng & Units 10/25/2019 10/24/2019 10/23/2019  WBC 4.0 - 10.5 K/uL 15.3(H) 13.6(H) 21.8(H)  Hemoglobin 12.0 - 15.0 g/dL 8.4(L) 8.1(L) 9.7(L)  Hematocrit 36 - 46 % 28.4(L) 27.0(L) 30.7(L)  Platelets 150 - 400 K/uL 268 205 272   CMP Latest Ref Rng & Units 10/25/2019 10/24/2019 10/23/2019  Glucose 70 - 99 mg/dL 204(H) 174(H) 223(H)  BUN 8 - 23  mg/dL 33(H) 31(H) 34(H)  Creatinine 0.44 - 1.00 mg/dL 0.93 0.88 1.11(H)  Sodium 135 - 145 mmol/L 147(H) 146(H) 140  Potassium 3.5 - 5.1 mmol/L 3.3(L) 3.1(L) 4.0  Chloride 98 - 111 mmol/L 106 106 103  CO2 22 - 32 mmol/L 29 29 26   Calcium 8.9 - 10.3 mg/dL 8.2(L) 8.2(L) 7.6(L)  Total Protein 6.5 - 8.1 g/dL 5.8(L) 5.4(L) 5.1(L)  Total Bilirubin 0.3 - 1.2 mg/dL 0.6 0.5 0.9  Alkaline Phos 38 - 126 U/L 60 54 72  AST 15 - 41 U/L 16 18 19   ALT 0 - 44 U/L 14 15 18     CXR: pending  _______________________________________________________________  Assessment and Plan: POD 11 s/p Robotic 3 field esophagectomy.  Respiratory failure, cervical anastamotic leak  Neuro: pain controlled CV: sinus, continue amio gtt for now Pulm: high likelihood that pt will require trach.  Minimal CT output from either drain.  Loculation likely from anastomotic leak draining down.  Will keep both until pt is extubated. Renal: stable GI: receiving J tube feeds.   Heme: stable  ID: wet to dry dressing to cervical incision.  Continue abx of asp pneumonia Endo: SSI  Dispo: continue ICU care  Melodie Bouillon, MD 10/25/2019 8:27 AM

## 2019-10-25 NOTE — Progress Notes (Signed)
[facility] ADULT ICU REPLACEMENT PROTOCOL   The patient does apply for the [facility] Adult ICU Electrolyte Replacment Protocol based on the criteria listed below:   1. Is GFR >/= 30 ml/min? Yes.    Patient's GFR today is >60 2. Is SCr </= 2? Yes.   Patient's SCr is 0.93 ml/kg/hr 3. Did SCr increase >/= 0.5 in 24 hours? No. 4. Abnormal electrolyte(s): k 3.3 5. Ordered repletion with: TCTS protocol 6. If a panic level lab has been reported, has the CCM MD in charge been notified? No..   Physician:    Ronda Fairly A 10/25/2019 5:08 AM

## 2019-10-26 ENCOUNTER — Inpatient Hospital Stay (HOSPITAL_COMMUNITY): Payer: Medicare Other

## 2019-10-26 DIAGNOSIS — A419 Sepsis, unspecified organism: Secondary | ICD-10-CM | POA: Diagnosis not present

## 2019-10-26 DIAGNOSIS — Z9049 Acquired absence of other specified parts of digestive tract: Secondary | ICD-10-CM | POA: Diagnosis not present

## 2019-10-26 DIAGNOSIS — R6521 Severe sepsis with septic shock: Secondary | ICD-10-CM

## 2019-10-26 DIAGNOSIS — J9601 Acute respiratory failure with hypoxia: Secondary | ICD-10-CM | POA: Diagnosis not present

## 2019-10-26 DIAGNOSIS — Z9889 Other specified postprocedural states: Secondary | ICD-10-CM | POA: Diagnosis not present

## 2019-10-26 LAB — CBC
HCT: 32.8 % — ABNORMAL LOW (ref 36.0–46.0)
Hemoglobin: 9.8 g/dL — ABNORMAL LOW (ref 12.0–15.0)
MCH: 28.9 pg (ref 26.0–34.0)
MCHC: 29.9 g/dL — ABNORMAL LOW (ref 30.0–36.0)
MCV: 96.8 fL (ref 80.0–100.0)
Platelets: 347 10*3/uL (ref 150–400)
RBC: 3.39 MIL/uL — ABNORMAL LOW (ref 3.87–5.11)
RDW: 14.6 % (ref 11.5–15.5)
WBC: 16.5 10*3/uL — ABNORMAL HIGH (ref 4.0–10.5)
nRBC: 0 % (ref 0.0–0.2)

## 2019-10-26 LAB — GLUCOSE, CAPILLARY
Glucose-Capillary: 124 mg/dL — ABNORMAL HIGH (ref 70–99)
Glucose-Capillary: 127 mg/dL — ABNORMAL HIGH (ref 70–99)
Glucose-Capillary: 146 mg/dL — ABNORMAL HIGH (ref 70–99)
Glucose-Capillary: 156 mg/dL — ABNORMAL HIGH (ref 70–99)
Glucose-Capillary: 159 mg/dL — ABNORMAL HIGH (ref 70–99)
Glucose-Capillary: 163 mg/dL — ABNORMAL HIGH (ref 70–99)
Glucose-Capillary: 184 mg/dL — ABNORMAL HIGH (ref 70–99)

## 2019-10-26 LAB — TYPE AND SCREEN
ABO/RH(D): A POS
Antibody Screen: NEGATIVE
Unit division: 0
Unit division: 0

## 2019-10-26 LAB — POCT I-STAT 7, (LYTES, BLD GAS, ICA,H+H)
Acid-Base Excess: 5 mmol/L — ABNORMAL HIGH (ref 0.0–2.0)
Bicarbonate: 31.4 mmol/L — ABNORMAL HIGH (ref 20.0–28.0)
Calcium, Ion: 1.2 mmol/L (ref 1.15–1.40)
HCT: 29 % — ABNORMAL LOW (ref 36.0–46.0)
Hemoglobin: 9.9 g/dL — ABNORMAL LOW (ref 12.0–15.0)
O2 Saturation: 98 %
Patient temperature: 99.4
Potassium: 4.2 mmol/L (ref 3.5–5.1)
Sodium: 147 mmol/L — ABNORMAL HIGH (ref 135–145)
TCO2: 33 mmol/L — ABNORMAL HIGH (ref 22–32)
pCO2 arterial: 54.4 mmHg — ABNORMAL HIGH (ref 32.0–48.0)
pH, Arterial: 7.372 (ref 7.350–7.450)
pO2, Arterial: 105 mmHg (ref 83.0–108.0)

## 2019-10-26 LAB — BASIC METABOLIC PANEL
Anion gap: 9 (ref 5–15)
BUN: 38 mg/dL — ABNORMAL HIGH (ref 8–23)
CO2: 30 mmol/L (ref 22–32)
Calcium: 8.4 mg/dL — ABNORMAL LOW (ref 8.9–10.3)
Chloride: 107 mmol/L (ref 98–111)
Creatinine, Ser: 1.1 mg/dL — ABNORMAL HIGH (ref 0.44–1.00)
GFR, Estimated: 51 mL/min — ABNORMAL LOW (ref >60)
Glucose, Bld: 172 mg/dL — ABNORMAL HIGH (ref 70–99)
Potassium: 4.2 mmol/L (ref 3.5–5.1)
Sodium: 146 mmol/L — ABNORMAL HIGH (ref 135–145)

## 2019-10-26 LAB — BPAM RBC
Blood Product Expiration Date: 202111012359
Blood Product Expiration Date: 202111012359
ISSUE DATE / TIME: 202110080918
ISSUE DATE / TIME: 202110080918
Unit Type and Rh: 6200
Unit Type and Rh: 6200

## 2019-10-26 LAB — TRIGLYCERIDES: Triglycerides: 225 mg/dL — ABNORMAL HIGH (ref <150)

## 2019-10-26 MED ORDER — HEPARIN SOD (PORK) LOCK FLUSH 100 UNIT/ML IV SOLN
500.0000 [IU] | INTRAVENOUS | Status: AC | PRN
  Administered 2019-10-26: 500 [IU]
  Filled 2019-10-26: qty 5

## 2019-10-26 MED ORDER — LACTATED RINGERS IV BOLUS
500.0000 mL | Freq: Once | INTRAVENOUS | Status: AC
  Administered 2019-10-26: 500 mL via INTRAVENOUS

## 2019-10-26 MED ORDER — PIPERACILLIN-TAZOBACTAM 3.375 G IVPB
3.3750 g | Freq: Three times a day (TID) | INTRAVENOUS | Status: DC
  Administered 2019-10-26 – 2019-10-27 (×4): 3.375 g via INTRAVENOUS
  Filled 2019-10-26 (×4): qty 50

## 2019-10-26 NOTE — Evaluation (Signed)
Occupational Therapy Re-Evaluation Patient Details Name: [name] MRN: [id number] DOB: October 13, 1950 Today's Date: 10/26/2019    History of Present Illness 69 yo with history of esophageal CA admitted for esophagectomy with jejunostomy placement Pt with respiratory distress on 10/14, transferred to ICU and intubated on 10/15 with one failed attempt to extubate. Additional PMHx: CoPD, Asthma, HTN, HLD, hypothyroidism, CAD   Clinical Impression   Pt lightly sedated and able to nod head to yes/no questions. Performed PROM of extremities. Pt is dependent in all ADL on ETT. May need to update goals and d/c disposition depending on outcome of next session.    Follow Up Recommendations  Home health OT;Supervision/Assistance - 24 hour    Equipment Recommendations  3 in 1 bedside commode    Recommendations for Other Services       Precautions / Restrictions Precautions Precautions: Fall;Other (comment) Precaution Comments: 2 chest tubes, j tube, ETT      Mobility Bed Mobility               General bed mobility comments: Nurse agreed to bed level exercises    Transfers                      Balance                                           ADL either performed or assessed with clinical judgement   ADL                                         General ADL Comments: dependent     Vision         Perception     Praxis      Pertinent Vitals/Pain Pain Assessment: Faces Faces Pain Scale: No hurt     Hand Dominance Right   Extremity/Trunk Assessment             Communication Communication Communication: No difficulties   Cognition Arousal/Alertness: Awake/alert Behavior During Therapy: WFL for tasks assessed/performed Overall Cognitive Status: Difficult to assess                                 General Comments: ETT, but answering yes/no questions   General Comments       Exercises  Exercises: General Upper Extremity;General Lower Extremity General Exercises - Upper Extremity Shoulder Flexion: PROM;Both;10 reps;Supine Elbow Flexion: Both;10 reps;Supine;PROM Elbow Extension: Both;10 reps;Supine;PROM Wrist Flexion: PROM;Both;10 reps;Supine Wrist Extension: PROM;Both;10 reps;Supine Digit Composite Flexion: PROM;Both;10 reps;Supine Composite Extension: PROM;Both;10 reps;Supine General Exercises - Lower Extremity Heel Slides: Both;Supine;PROM;10 reps Hip ABduction/ADduction: Both;10 reps;Supine;PROM   Shoulder Instructions      Home Living Family/patient expects to be discharged to:: Private residence Living Arrangements: Spouse/significant other Available Help at Discharge: Friend(s);Available 24 hours/day;Family Type of Home: Mobile home Home Access: Stairs to enter Entrance Stairs-Number of Steps: 4 Entrance Stairs-Rails: Can reach both Home Layout: One level     Bathroom Shower/Tub: Occupational psychologist: Standard     Home Equipment: Environmental consultant - 2 wheels;Cane - single point;Grab bars - toilet;Grab bars - tub/shower;Shower seat - built in;Cane - quad          Prior Functioning/Environment  Level of Independence: Needs assistance    ADL's / Homemaking Assistance Needed: independent in ADLs, cooks, boyfriend assists with iADLs            OT Problem List: Decreased strength;Decreased activity tolerance;Impaired balance (sitting and/or standing);Pain      OT Treatment/Interventions: Self-care/ADL training;DME and/or AE instruction;Therapeutic activities;Patient/family education;Balance training    OT Goals(Current goals can be found in the care plan section) Acute Rehab OT Goals OT Goal Formulation: Patient unable to participate in goal setting Time For Goal Achievement: 11/09/19 Potential to Achieve Goals: Good  OT Frequency: Min 2X/week   Barriers to D/C:            Co-evaluation              AM-PAC OT "6 Clicks" Daily  Activity     Outcome Measure Help from another person eating meals?: Total Help from another person taking care of personal grooming?: Total Help from another person toileting, which includes using toliet, bedpan, or urinal?: Total Help from another person bathing (including washing, rinsing, drying)?: Total Help from another person to put on and taking off regular upper body clothing?: Total Help from another person to put on and taking off regular lower body clothing?: Total 6 Click Score: 6   End of Session    Activity Tolerance: Patient tolerated treatment well Patient left: in bed;with call bell/phone within reach  OT Visit Diagnosis: Unsteadiness on feet (R26.81);Other abnormalities of gait and mobility (R26.89);Pain;Muscle weakness (generalized) (M62.81)                Time: 0981-1914 OT Time Calculation (min): 14 min Charges:  OT General Charges $OT Visit: 1 Visit OT Evaluation $OT Re-eval: 1 Re-eval  Nestor Lewandowsky, OTR/L Acute Rehabilitation Services Pager: [phone] Office: [phone]  Malka So 10/26/2019, 1:09 PM

## 2019-10-26 NOTE — Progress Notes (Signed)
[address]       [employer] [address]             [phone]                 12 Days Post-Op Procedure(s) (LRB): XI ROBOTIC ASSISTED [place] ESOPHAGECTOMY USING NIMS (N/A) VIDEO BRONCHOSCOPY (N/A) JEJUNOSTOMY PLACEMENT (N/A) INTERCOSTAL NERVE BLOCK (Right)   Events: Remains intubated _______________________________________________________________ Vitals: BP (!) 104/55   Pulse 96   Temp 100.1 F (37.8 C) (Oral)   Resp (!) 21   Ht _0  (1.575 m)   Wt 72 kg   SpO2 98%   BMI 29.03 kg/m   - Neuro: sedated  - Cardiovascular: sinus  Drips: amio.      - Pulm:  Vent Mode: PRVC FiO2 (%):  [50 %-80 %] 70 % Set Rate:  [26 bmp] 26 bmp Vt Set:  [300 mL] 300 mL PEEP:  [8 cmH20] 8 cmH20 Pressure Support:  [8 cmH20] 8 cmH20 Plateau Pressure:  [18 cmH20-23 cmH20] 23 cmH20  ABG    Component Value Date/Time   PHART 7.372 10/26/2019 0749   PCO2ART 54.4 (H) 10/26/2019 0749   PO2ART 105 10/26/2019 0749   HCO3 31.4 (H) 10/26/2019 0749   TCO2 33 (H) 10/26/2019 0749   ACIDBASEDEF 7.8 (H) 10/29/2019 2050   O2SAT 98.0 10/26/2019 0749    - Abd: soft.  J tube in place - Extremity: warm  .Intake/Output      10/19 0701 - 10/20 0700 10/20 0701 - 10/21 0700   P.O.     I.V. (mL/kg) 1695 (23.5)    Other 200    NG/GT 681.8    IV Piggyback 655.3    Chest Tube 102    Total Intake(mL/kg) 3334 (46.3)    Urine (mL/kg/hr) 1950 (1.1) 230 (0.9)   Stool 200    Chest Tube 48    Total Output 2198 230   Net +1136 -230           _______________________________________________________________ Labs: CBC Latest Ref Rng & Units 10/26/2019 10/26/2019 10/25/2019  WBC 4.0 - 10.5 K/uL - 16.5(H) 15.3(H)  Hemoglobin 12.0 - 15.0 g/dL 9.9(L) 9.8(L) 8.4(L)  Hematocrit 36 - 46 % 29.0(L) 32.8(L) 28.4(L)  Platelets 150 - 400 K/uL - 347 268   CMP Latest Ref Rng & Units 10/26/2019 10/26/2019 10/25/2019  Glucose 70 - 99 mg/dL - 172(H) 204(H)  BUN 8 - 23 mg/dL - 38(H) 33(H)   Creatinine 0.44 - 1.00 mg/dL - 1.10(H) 0.93  Sodium 135 - 145 mmol/L 147(H) 146(H) 147(H)  Potassium 3.5 - 5.1 mmol/L 4.2 4.2 3.3(L)  Chloride 98 - 111 mmol/L - 107 106  CO2 22 - 32 mmol/L - 30 29  Calcium 8.9 - 10.3 mg/dL - 8.4(L) 8.2(L)  Total Protein 6.5 - 8.1 g/dL - - 5.8(L)  Total Bilirubin 0.3 - 1.2 mg/dL - - 0.6  Alkaline Phos 38 - 126 U/L - - 60  AST 15 - 41 U/L - - 16  ALT 0 - 44 U/L - - 14    CXR: pending  _______________________________________________________________  Assessment and Plan: POD 12 s/p Robotic 3 field esophagectomy.  Respiratory failure, cervical anastamotic leak  Neuro: pain controlled CV: sinus, continue amio gtt for now Pulm: discussed case with ICU team, trach and incision care will be challenging.  Will continue vent management for now.  No rush for trach.  Minimal CT output from either drain.  Loculation likely from anastomotic  leak draining down.  Will keep both until pt is extubated. Renal: stable GI: receiving J tube feeds.   Heme: stable ID: wet to dry dressing to cervical incision.  Continue abx of asp pneumonia Endo: SSI  Dispo: continue ICU care  Melodie Bouillon, MD 10/26/2019 10:28 AM

## 2019-10-26 NOTE — Care Plan (Signed)
large amount of drainage note at J tube site, color is green tinged and tan (like tube feeding) Pt c/o of increased pain and DIB, E- Link notified orders recieved

## 2019-10-26 NOTE — Progress Notes (Signed)
NAME:  [name], MRN:  [id number], DOB:  1950/03/27, LOS: 16 ADMISSION DATE:  10/12/2019, CONSULTATION DATE: 10/20/2019 REFERRING MD:  Dr. Kipp Brood, CHIEF COMPLAINT:  SOB   Brief History   [name] is a 69 y/o female with a PMHx of esophageal squamous cell carcinoma admitted on 10/08 for esophagectomy with interval development of respiratory distress requiring BiPAP and transfer to ICU.   History of present illness   [name] presented to [facility] for admission for elective esophagectomy on 10/08 that proceeded without any complications. She is currently POD 6.   Overnight, patient developed worsening work of breathing requiring NRB. She was noted to be diaphoretic with decreased lung sounds by the RN. Rapid response was called, who switched chest tube from water seal to suction. Air leak noted in [place]. CXR obtained overnight showed small right basilar pneumothorax with loculated pleural effusion, as well as interval development of extensive airspace infiltrate in the LUL. Repeat CXR this morning shows improvement in pneumothorax with no change in opacities. In the late morning, patient developed worsening respiratory distress requiring BiPAP placement and transfer to ICU for close monitoring.  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 Fib,   Significant Hospital Events   10/08 >> Admitted to [facility] 10/14 >> Transferred to ICU for respiratory distress   Consults:  PCCM  Procedures:  10/08 >> Esophagectomy 10/15 >> ETT placed, Bronchoscopy     Significant Diagnostic Tests:  CXR 10/13 >> Small right basilar pneumothorax, however, has increased in size since prior examination and there has now developed a small amount of laterally loculated pleural fluid. Extensive airspace infiltrate has now developed throughout the left upper lobe, likely infectious in the appropriate clinical setting.   CXR 10/14  >> Right basilar  pneumothorax is smaller compared to earlier in the day. No tension component. Subcutaneous air present on the right. There is extensive airspace opacity throughout the left upper lobe and left perihilar regions, similar to earlier in the day. There is patchy atelectasis in the right mid lung and right base with suspected loculated pleural effusion on the right laterally  Micro Data:  10/18 Pleural culture - moderate GNR 10/15 Respiratory culture - Pan-senstive PA  Antimicrobials:  Zosyn 10/14 >>   Interim history/subjective:  Patient agitated. Indicating abdominal pain Worsening hypoxemia overnight requiring increase of FIO2 to70% Fentanyl increased Levo weaned overnight however restarted and now up to 12 mcg/hr  Objective   Blood pressure (!) 104/55, pulse 96, temperature 100.1 F (37.8 C), temperature source Oral, resp. rate (!) 21, height 5\' 2"  (1.575 m), weight 72 kg, SpO2 98 %.    Vent Mode: PRVC FiO2 (%):  [50 %-80 %] 70 % Set Rate:  [26 bmp] 26 bmp Vt Set:  [300 mL] 300 mL PEEP:  [8 cmH20] 8 cmH20 Pressure Support:  [8 cmH20] 8 cmH20 Plateau Pressure:  [18 cmH20-23 cmH20] 23 cmH20   Intake/Output Summary (Last 24 hours) at 10/26/2019 1053 Last data filed at 10/26/2019 1038 Gross per 24 hour  Intake 3052.01 ml  Output 2128 ml  Net 924.01 ml   Filed Weights   10/24/19 0500 10/25/19 0500 10/26/19 0700  Weight: 74.7 kg 71.8 kg 72 kg   Physical Exam: General: Elderly chronically ill-appearing, no acute distress HENT: [place], AT, ETT in place Eyes: EOMI, no scleral icterus Respiratory: Coarse breath sounds bilaterally.  No crackles, wheezing or rales Cardiovascular: RRR, -M/R/G, no JVD GI: BS+,  soft, mildly tender to palpation Extremities:-Edema,-tenderness Neuro: Awake, alert, follows commands, moves extremities x 4 Skin: Intact, no rashes or bruising GU: Foley in place Neck:      CBC Latest Ref Rng & Units 10/26/2019 10/26/2019 10/25/2019  WBC 4.0 - 10.5 K/uL -  16.5(H) 15.3(H)  Hemoglobin 12.0 - 15.0 g/dL 9.9(L) 9.8(L) 8.4(L)  Hematocrit 36 - 46 % 29.0(L) 32.8(L) 28.4(L)  Platelets 150 - 400 K/uL - 347 268   CMP Latest Ref Rng & Units 10/26/2019 10/26/2019 10/25/2019  Glucose 70 - 99 mg/dL - 172(H) 204(H)  BUN 8 - 23 mg/dL - 38(H) 33(H)  Creatinine 0.44 - 1.00 mg/dL - 1.10(H) 0.93  Sodium 135 - 145 mmol/L 147(H) 146(H) 147(H)  Potassium 3.5 - 5.1 mmol/L 4.2 4.2 3.3(L)  Chloride 98 - 111 mmol/L - 107 106  CO2 22 - 32 mmol/L - 30 29  Calcium 8.9 - 10.3 mg/dL - 8.4(L) 8.2(L)  Total Protein 6.5 - 8.1 g/dL - - 5.8(L)  Total Bilirubin 0.3 - 1.2 mg/dL - - 0.6  Alkaline Phos 38 - 126 U/L - - 60  AST 15 - 41 U/L - - 16  ALT 0 - 44 U/L - - 14   Resolved Hospital Problem list   AKI  Assessment & Plan:  # Acute hypoxemic and hypercarbic Respiratory Failure  # Aspiration Pneumonia/Pseudomonas Aeruginosa HCAP # Pneumothorax (R): Resolved with tube thoracostomy # Hx of Asthma  -Wean vent settings however no immediate plans to extubate. Reintubated on 10/17. Family agreeable to trach when team is ready. However post-trach care poses a challenge due to the anastomotic leak. Discussed with CTS. For now, will keep patient intubated for now -PAD protocol: Change from propofol to precedex. Wean fentanyl. RASS goal -1 -Chest tube management x 2 per primary -Continue Zosyn, follow-up final tracheal aspirate and JP drain culture  #Right GNR empyema #Loculated effusion secondary to anastomotic leak -S/p chest tube for source control -Continue Zosyn. Plan for 4-6 weeks  #Septic shock secondary to HCAP and empyema -Wean levophed for MAP goal >65 -Repeat 500cc LR bolus  # Sinus Tachycardia # Hx of Atrial Fibrillation  -Continue amiodarone -Continue eliquis  # Esophageal Squamous Cell Carcinoma s/p Esophagectomy c/b anastomotic leak - Management of incision site per TCTS   # Non-obstructive CAD # LAD saccular aneurysm  # HTN  - Home Plavix on  hold - Continue aspirin - Home Imdur on hold  - Resume home metoprolol when Bps allow  # Hx of Hypothyroidism  - Continue daily Levothyroxine  # High TG- should resolve off propofol  Best practice:  Diet: Tube Feeds per G Tube  Pain/Anxiety/Delirium protocol (if indicated): wean off VAP protocol (if indicated): Ordered  DVT prophylaxis: full dose lovenox GI prophylaxis: Protonix Glucose control: SSI Mobility: up to chair Code Status: FULL Family Communication: Per primary. I spoke with husband and daughter last night regarding respiratory status and they are amenable to trach whe indicated.  Disposition: ICU  The patient is critically ill with multiple organ systems failure and requires high complexity decision making for assessment and support, frequent evaluation and titration of therapies, application of advanced monitoring technologies and extensive interpretation of multiple databases.  Independent Critical Care Time: 46 Minutes.   Rodman Pickle, M.D. [facility] Pulmonary/Critical Care Medicine 10/26/2019 10:53 AM   Please see Amion for pager number to reach on-call Pulmonary and Critical Care Team.

## 2019-10-26 NOTE — Progress Notes (Signed)
Joycelyn Schmid (patient's daughter) called and updated on plan of care for the day. Made aware that patient was placed in bilateral wrist restraints overnight and the plan to phase out of restraints as she is able to follow commands and not be pulling at lines and tubes.

## 2019-10-26 NOTE — Progress Notes (Signed)
TCTS BRIEF SICU PROGRESS NOTE  12 Days Post-Op  S/P Procedure(s) (LRB): XI ROBOTIC ASSISTED [place] ESOPHAGECTOMY USING NIMS (N/A) VIDEO BRONCHOSCOPY (N/A) JEJUNOSTOMY PLACEMENT (N/A) INTERCOSTAL NERVE BLOCK (Right)   Stable day  Plan: Continue current plan  Rexene Alberts, MD 10/26/2019 5:43 PM

## 2019-10-26 NOTE — Care Plan (Signed)
RT notified pt unable to maintain O2 sat's at current FiO2 of 50%. Will increase FiO2 to maintain O2 Sat's WNL

## 2019-10-26 NOTE — Progress Notes (Signed)
After phone conversation with Tami Ribas MD tube feeds were resumed at prescribed rate.  MD states that the bile drainage around J Tube was not new and no tube feeds were noted leaking.  Dextrose drip d/c'd as the tube feeds were resumed.

## 2019-10-26 NOTE — Plan of Care (Signed)
?  Problem: Clinical Measurements: ?Goal: Ability to maintain clinical measurements within normal limits will improve ?Outcome: Progressing ?Goal: Will remain free from infection ?Outcome: Progressing ?Goal: Diagnostic test results will improve ?Outcome: Progressing ?Goal: Respiratory complications will improve ?Outcome: Progressing ?Goal: Cardiovascular complication will be avoided ?Outcome: Progressing ?  ?Problem: Activity: ?Goal: Risk for activity intolerance will decrease ?Outcome: Progressing ?  ?Problem: Elimination: ?Goal: Will not experience complications related to bowel motility ?Outcome: Progressing ?Goal: Will not experience complications related to urinary retention ?Outcome: Progressing ?  ?Problem: Pain Managment: ?Goal: General experience of comfort will improve ?Outcome: Progressing ?  ?Problem: Safety: ?Goal: Ability to remain free from injury will improve ?Outcome: Progressing ?  ?Problem: Skin Integrity: ?Goal: Risk for impaired skin integrity will decrease ?Outcome: Progressing ?  ?

## 2019-10-27 DIAGNOSIS — R0603 Acute respiratory distress: Secondary | ICD-10-CM | POA: Diagnosis not present

## 2019-10-27 DIAGNOSIS — E43 Unspecified severe protein-calorie malnutrition: Secondary | ICD-10-CM

## 2019-10-27 DIAGNOSIS — E440 Moderate protein-calorie malnutrition: Secondary | ICD-10-CM | POA: Insufficient documentation

## 2019-10-27 LAB — CBC
HCT: 29.8 % — ABNORMAL LOW (ref 36.0–46.0)
Hemoglobin: 8.8 g/dL — ABNORMAL LOW (ref 12.0–15.0)
MCH: 28.7 pg (ref 26.0–34.0)
MCHC: 29.5 g/dL — ABNORMAL LOW (ref 30.0–36.0)
MCV: 97.1 fL (ref 80.0–100.0)
Platelets: 326 10*3/uL (ref 150–400)
RBC: 3.07 MIL/uL — ABNORMAL LOW (ref 3.87–5.11)
RDW: 14.7 % (ref 11.5–15.5)
WBC: 18 10*3/uL — ABNORMAL HIGH (ref 4.0–10.5)
nRBC: 0 % (ref 0.0–0.2)

## 2019-10-27 LAB — GLUCOSE, CAPILLARY
Glucose-Capillary: 110 mg/dL — ABNORMAL HIGH (ref 70–99)
Glucose-Capillary: 112 mg/dL — ABNORMAL HIGH (ref 70–99)
Glucose-Capillary: 129 mg/dL — ABNORMAL HIGH (ref 70–99)
Glucose-Capillary: 131 mg/dL — ABNORMAL HIGH (ref 70–99)
Glucose-Capillary: 141 mg/dL — ABNORMAL HIGH (ref 70–99)
Glucose-Capillary: 147 mg/dL — ABNORMAL HIGH (ref 70–99)
Glucose-Capillary: 171 mg/dL — ABNORMAL HIGH (ref 70–99)

## 2019-10-27 LAB — BASIC METABOLIC PANEL
Anion gap: 11 (ref 5–15)
BUN: 40 mg/dL — ABNORMAL HIGH (ref 8–23)
CO2: 25 mmol/L (ref 22–32)
Calcium: 7 mg/dL — ABNORMAL LOW (ref 8.9–10.3)
Chloride: 109 mmol/L (ref 98–111)
Creatinine, Ser: 1.13 mg/dL — ABNORMAL HIGH (ref 0.44–1.00)
GFR, Estimated: 49 mL/min — ABNORMAL LOW (ref >60)
Glucose, Bld: 231 mg/dL — ABNORMAL HIGH (ref 70–99)
Potassium: 3.3 mmol/L — ABNORMAL LOW (ref 3.5–5.1)
Sodium: 145 mmol/L (ref 135–145)

## 2019-10-27 LAB — LACTIC ACID, PLASMA
Lactic Acid, Venous: 1.5 mmol/L (ref 0.5–1.9)
Lactic Acid, Venous: 1.3 mmol/L (ref 0.5–1.9)

## 2019-10-27 LAB — PROCALCITONIN: Procalcitonin: 0.99 ng/mL

## 2019-10-27 LAB — TRIGLYCERIDES: Triglycerides: 185 mg/dL — ABNORMAL HIGH (ref <150)

## 2019-10-27 MED ORDER — POTASSIUM CHLORIDE 20 MEQ PO PACK
40.0000 meq | PACK | Freq: Four times a day (QID) | ORAL | Status: DC
  Administered 2019-10-27 – 2019-10-28 (×3): 40 meq
  Filled 2019-10-27 (×3): qty 2

## 2019-10-27 MED ORDER — VITAL AF 1.2 CAL PO LIQD
1000.0000 mL | ORAL | Status: DC
  Administered 2019-10-27: 1000 mL

## 2019-10-27 MED ORDER — FLUCONAZOLE IN SODIUM CHLORIDE 200-0.9 MG/100ML-% IV SOLN
200.0000 mg | INTRAVENOUS | Status: DC
  Administered 2019-10-27: 200 mg via INTRAVENOUS
  Filled 2019-10-27: qty 100

## 2019-10-27 MED ORDER — METOCLOPRAMIDE HCL 5 MG/ML IJ SOLN
10.0000 mg | Freq: Four times a day (QID) | INTRAMUSCULAR | Status: DC
  Administered 2019-10-27 – 2019-11-08 (×48): 10 mg via INTRAVENOUS
  Filled 2019-10-27 (×47): qty 2

## 2019-10-27 MED ORDER — SODIUM CHLORIDE 0.9 % IV SOLN
2.0000 g | Freq: Two times a day (BID) | INTRAVENOUS | Status: DC
  Administered 2019-10-27 – 2019-10-31 (×8): 2 g via INTRAVENOUS
  Filled 2019-10-27 (×8): qty 2

## 2019-10-27 MED ORDER — METRONIDAZOLE IN NACL 5-0.79 MG/ML-% IV SOLN
500.0000 mg | Freq: Three times a day (TID) | INTRAVENOUS | Status: DC
  Administered 2019-10-27 – 2019-10-31 (×11): 500 mg via INTRAVENOUS
  Filled 2019-10-27 (×12): qty 100

## 2019-10-27 NOTE — Progress Notes (Signed)
NAME:  [name], MRN:  [id number], DOB:  [birth year], LOS: 44 ADMISSION DATE:  10/12/2019, CONSULTATION DATE: 10/20/2019 REFERRING MD:  Dr. Kipp Brood, CHIEF COMPLAINT:  SOB   Brief History   [name] is a 69 y/o female with a PMHx of esophageal squamous cell carcinoma admitted on 10/08 for esophagectomy with interval development of respiratory distress requiring BiPAP and transfer to ICU.   History of present illness   Mrs. [name] presented to [facility] for admission for elective esophagectomy on 10/08 that proceeded without any complications. She is currently POD 6.   Overnight, patient developed worsening work of breathing requiring NRB. She was noted to be diaphoretic with decreased lung sounds by the RN. Rapid response was called, who switched chest tube from water seal to suction. Air leak noted in [place]. CXR obtained overnight showed small right basilar pneumothorax with loculated pleural effusion, as well as interval development of extensive airspace infiltrate in the LUL. Repeat CXR this morning shows improvement in pneumothorax with no change in opacities. In the late morning, patient developed worsening respiratory distress requiring BiPAP placement and transfer to ICU for close monitoring.  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 Fib,   Significant Hospital Events   10/08 >> Admitted to [facility] 10/14 >> Transferred to ICU for respiratory distress   Consults:  PCCM  Procedures:  10/08 >> Esophagectomy 10/15 >> ETT placed, Bronchoscopy     Significant Diagnostic Tests:  CXR 10/13 >> Small right basilar pneumothorax, however, has increased in size since prior examination and there has now developed a small amount of laterally loculated pleural fluid. Extensive airspace infiltrate has now developed throughout the left upper lobe, likely infectious in the appropriate clinical setting.   CXR 10/14  >> Right basilar  pneumothorax is smaller compared to earlier in the day. No tension component. Subcutaneous air present on the right. There is extensive airspace opacity throughout the left upper lobe and left perihilar regions, similar to earlier in the day. There is patchy atelectasis in the right mid lung and right base with suspected loculated pleural effusion on the right laterally  ABD xray 10/20 > Mild diffuse air distension of the bowel, either reflecting ileus or low-grade obstruction.  Micro Data:  10/18 Pleural culture - moderate GNR 10/15 Respiratory culture - Pan-senstive PA Wound culture 10/18 >   Antimicrobials:  Zosyn 10/14 >>   Interim history/subjective:  Sedated on vent Grimace to ABD palpitation   Objective   Blood pressure (!) 119/54, pulse 88, temperature 98.1 F (36.7 C), temperature source Oral, resp. rate 15, height 5\' 2"  (1.575 m), weight 72.9 kg, SpO2 92 %.    Vent Mode: PRVC FiO2 (%):  [50 %-70 %] 50 % Set Rate:  [25 bmp-26 bmp] 26 bmp Vt Set:  [300 mL] 300 mL PEEP:  [8 cmH20] 8 cmH20 Plateau Pressure:  [22 cmH20-27 cmH20] 27 cmH20   Intake/Output Summary (Last 24 hours) at 10/27/2019 1003 Last data filed at 10/27/2019 0600 Gross per 24 hour  Intake 1808.78 ml  Output 630 ml  Net 1178.78 ml   Filed Weights   10/25/19 0500 10/26/19 0700 10/27/19 0345  Weight: 71.8 kg 72 kg 72.9 kg   Physical Exam: General: Chronically ill appearing deconditioned elderly female lying in bed on mechanical ventilation, in NAD HEENT: ETT, MM pink/moist, PERRL, temporal waisting, see wound image below  Neuro: Sedated on vent, withdrawal from pain  CV: s1s2 regular  rate and rhythm, no murmur, rubs, or gallops,  PULM:  Clear breath sounds, tolerating vent well,  GI: soft, bowel sounds active in all 4 quadrants, non-tender, non-distended, tolerating TF Extremities: warm/dry, no edema  Skin: no rashes or lesions     Resolved Hospital Problem list   AKI  Assessment & Plan:    Acute hypoxemic and hypercarbic Respiratory Failure  -Reintubated on 10/17. Family agreeable to trach when team is ready. However post-trach care poses a challenge due to the anastomotic leak. Discussed with CTS. For now, will keep patient intubated for now Aspiration Pneumonia/Pseudomonas Aeruginosa HCAP Right Pneumothorax  -Resolved with tube thoracostomy Hx of Asthma  P: Continue ventilator support with lung protective strategies  Wean PEEP and FiO2 for sats greater than 90%. Head of bed elevated 30 degrees. Plateau pressures less than 30 cm H20.  Follow intermittent chest x-ray and ABG.   SAT/SBT as tolerated, mentation preclude extubation  Ensure adequate pulmonary hygiene  Follow cultures  VAP bundle in place  PAD protocol Routine chest tube care, management per primary  Continue zosyn   Right GNR empyema Loculated effusion secondary to anastomotic leak -S/p chest tube for source control P: Continue prolonged Zosyn for 4-6weeks  Continue management per above   Septic shock secondary to HCAP and empyema P: Continue pressor support for MAP goal >65 Monitor hemodynamics in the ICU setting   Sinus Tachycardia Hx of Atrial Fibrillation  P: Continue Amiodarone  Continue Eliquis Continuous telemetry   Esophageal Squamous Cell Carcinoma s/p Esophagectomy c/b anastomotic leak - Management of incision site per TCTS  P: Supportive care  Early developing Ileus  -ABD xray 10/20 with mild diffuse air distension of the bowel, either reflecting ileus or low-grade obstruction. P: Begin Reglan Bowel regiment  Non-obstructive CAD LAD saccular aneurysm  HTN  P: Home Plavix and Imdur remains on hold Continue ASA Resume home antihypertensives when BP allow   Hx of Hypothyroidism  P: Continue daily Levothyroxone   High TG -Improving off Propofol   Best practice:  Diet: Tube Feeds per G Tube  Pain/Anxiety/Delirium protocol (if indicated): wean off VAP protocol  (if indicated): Ordered  DVT prophylaxis: full dose lovenox GI prophylaxis: Protonix Glucose control: SSI Mobility: up to chair Code Status: FULL Family Communication: Per primary.  Disposition: ICU  CRITICAL CARE Performed by: Johnsie Cancel   Total critical care time: 37 minutes  Critical care time was exclusive of separately billable procedures and treating other patients.  Critical care was necessary to treat or prevent imminent or life-threatening deterioration.  Critical care was time spent personally by me on the following activities: development of treatment plan with patient and/or surrogate as well as nursing, discussions with consultants, evaluation of patient's response to treatment, examination of patient, obtaining history from patient or surrogate, ordering and performing treatments and interventions, ordering and review of laboratory studies, ordering and review of radiographic studies, pulse oximetry and re-evaluation of patient's condition.  Johnsie Cancel, NP-C [facility] Contact / Pager information can be found on Amion  10/27/2019, 10:23 AM

## 2019-10-27 NOTE — Progress Notes (Signed)
PT Cancellation Note  Patient Details Name: NIVA MURREN MRN: [id number] DOB: Jun 21, 1950   Cancelled Treatment:    Reason Eval/Treat Not Completed: Medical issues which prohibited therapy (Pt on incr precedex and levophed per nsg.  Check tomorrow. )   Denice Paradise 10/27/2019, 10:47 AM Pleasant Britz W,PT Acute Rehabilitation Services Pager:  [phone]  Office:  [phone]

## 2019-10-27 NOTE — Progress Notes (Signed)
      [address]       [place], [address]             [phone]      POD # 13 esophagectomy  BP (!) 128/43   Pulse 86   Temp 99.2 F (37.3 C) (Oral)   Resp 15   Ht 5\' 2"  (1.575 m)   Wt 72.9 kg   SpO2 94%   BMI 29.40 kg/m   norepi at 13  Intake/Output Summary (Last 24 hours) at 10/27/2019 1927 Last data filed at 10/27/2019 1800 Gross per 24 hour  Intake 2906.67 ml  Output 610 ml  Net 2296.67 ml   Intubated PRVC 26/50%/ 8 PEEP  On maxipime, flagyl, diflucan  Remo Lipps C. Roxan Hockey, MD [facility] [phone]

## 2019-10-27 NOTE — Progress Notes (Signed)
Nutrition Follow-up  DOCUMENTATION CODES:   Non-severe (moderate) malnutrition in context of chronic illness  INTERVENTION:   Once able, restart 24-hour continuous tube feeds via J-tube: - Vital AF 1.2 @ 55 ml/hr (1320 ml/day)  Tube feeding regimen will provide 1584 kcal, 99 grams of protein, and 1071 ml of H2O.   NUTRITION DIAGNOSIS:   Moderate Malnutrition related to chronic illness (COPD, esophageal cancer s/p chemoradiation) as evidenced by moderate fat depletion, severe muscle depletion.  New diagnosis after completion of NFPE  GOAL:   Patient will meet greater than or equal to 90% of their needs  Unmet at this time as TF are off due to ileus  MONITOR:   Vent status, Labs, Weight trends, TF tolerance, Skin, I & O's  REASON FOR ASSESSMENT:   Consult Enteral/tube feeding initiation and management (cycle tube feeds over 16 hours)  ASSESSMENT:   Patient with PMH significant for COPD, diverticulosis, GERD, SBO, HTN, IBS, mixed HLD, and esophageal squamous cell cancer s/p neoadjuvant chemoradiation therapy s/p PEG. Presents this admission for surgical management of esophageal cancer.  10/09 - s/p esophagectomy, J-tube 10/12 - esophagram with no evidence of anastomotic leak, NGT removed, clear liquids 10/13 - tube feeds transitioned to nocturnal 10/14 - rapid response, CXR showing new right basilar pneumothorax and worsening airspace disease in LUL, chest tube placed back to suction 10/15 - intubated 10/17 - extubated 10/18 - reintubated, right-sided chest tube placed 10/19 - TF held due to drainage around J-tube site 10/20 - TF resumed  Discussed pt with RN and during ICU rounds. Tube feeds being held at this time due to possible ileus. Abdominal x-ray on 10/20 with "mild diffuse air distention of the bowel, either reflecting ileus or low-grade obstruction." Reglan started and bowel regimen continues.  Admit weight: 73 kg Current weight: 72.9 kg  Patient is  currently intubated on ventilator support MV: 10.7 L/min Temp (24hrs), Avg:99 F (37.2 C), Min:98.1 F (36.7 C), Max:100.5 F (38.1 C) BP (cuff): 108/47 MAP (cuff): 66  Drips: Precedex Fentanyl Amiodarone Levophed  Medications reviewed and include: colace, SSI q 4 hours, levemir 10 units daily, IV Reglan 10 mg q 6 hours, protonix, miralax, senna, IV abx  Labs reviewed: potassium 3.3, BUN 40, creaitnine 1.13, hemoglobin 8.8 CBG's: 112-163 x 24 hours  UOP: 850 ml x 24 hours CT: 10 ml x 24 hours I/O's: +14.2 L since admit  NUTRITION - FOCUSED PHYSICAL EXAM:    Most Recent Value  Orbital Region Severe depletion  Upper Arm Region Moderate depletion  Thoracic and Lumbar Region Moderate depletion  Buccal Region Unable to assess  Temple Region Severe depletion  Clavicle Bone Region Severe depletion  Clavicle and Acromion Bone Region Severe depletion  Scapular Bone Region Unable to assess  Dorsal Hand Moderate depletion  Patellar Region Mild depletion  Anterior Thigh Region Mild depletion  Posterior Calf Region Mild depletion  Edema (RD Assessment) None  Hair Reviewed  Eyes Unable to assess  Mouth Unable to assess  Skin Reviewed  Nails Reviewed       Diet Order:   Diet Order    None      EDUCATION NEEDS:   Education needs have been addressed  Skin:  Skin Assessment: Skin Integrity Issues: Stage II: bilateral buttocks Incisions: right chest, abdomen x 2  Last BM:  10/26/19 type 7 via rectal tube  Height:   Ht Readings from Last 1 Encounters:  10/20/19 5\' 2"  (1.575 m)    Weight:   Wt  Readings from Last 1 Encounters:  10/27/19 72.9 kg    BMI:  Body mass index is 29.4 kg/m.  Estimated Nutritional Needs:   Kcal:  1558  Protein:  100-120 gram  Fluid:  >/= 1.5 L    Gaynell Face, MS, RD, LDN Inpatient Clinical Dietitian Please see AMiON for contact information.

## 2019-10-27 NOTE — Progress Notes (Signed)
[address]       [place] [address]             [phone]                 13 Days Post-Op Procedure(s) (LRB): XI ROBOTIC ASSISTED [place] ESOPHAGECTOMY USING NIMS (N/A) VIDEO BRONCHOSCOPY (N/A) JEJUNOSTOMY PLACEMENT (N/A) INTERCOSTAL NERVE BLOCK (Right)   Events: Remains intubated _______________________________________________________________ Vitals: BP (!) 128/43   Pulse 86   Temp 99.2 F (37.3 C) (Oral)   Resp 15   Ht 5\' 2"  (1.575 m)   Wt 72.9 kg   SpO2 94%   BMI 29.40 kg/m   - Neuro: sedated  - Cardiovascular: sinus  Drips: amio.      - Pulm:  Vent Mode: PRVC FiO2 (%):  [50 %-70 %] 50 % Set Rate:  [25 bmp-26 bmp] 26 bmp Vt Set:  [300 mL] 300 mL PEEP:  [8 cmH20] 8 cmH20 Plateau Pressure:  [22 cmH20-27 cmH20] 22 cmH20  ABG    Component Value Date/Time   PHART 7.372 10/26/2019 0749   PCO2ART 54.4 (H) 10/26/2019 0749   PO2ART 105 10/26/2019 0749   HCO3 31.4 (H) 10/26/2019 0749   TCO2 33 (H) 10/26/2019 0749   ACIDBASEDEF 7.8 (H) 10/09/2019 2050   O2SAT 98.0 10/26/2019 0749    - Abd: soft.  J tube in place - Extremity: warm  .Intake/Output      10/21 0701 - 10/22 0700   I.V. (mL/kg) 578.4 (7.9)   Other 60   NG/GT 1108.8   IV Piggyback 263.1   Total Intake(mL/kg) 2010.4 (27.6)   Urine (mL/kg/hr) 360 (0.4)   Stool 0   Chest Tube 0   Total Output 360   Net +1650.4          _______________________________________________________________ Labs: CBC Latest Ref Rng & Units 10/27/2019 10/26/2019 10/26/2019  WBC 4.0 - 10.5 K/uL 18.0(H) - 16.5(H)  Hemoglobin 12.0 - 15.0 g/dL 8.8(L) 9.9(L) 9.8(L)  Hematocrit 36 - 46 % 29.8(L) 29.0(L) 32.8(L)  Platelets 150 - 400 K/uL 326 - 347   CMP Latest Ref Rng & Units 10/27/2019 10/26/2019 10/26/2019  Glucose 70 - 99 mg/dL 231(H) - 172(H)  BUN 8 - 23 mg/dL 40(H) - 38(H)  Creatinine 0.44 - 1.00 mg/dL 1.13(H) - 1.10(H)  Sodium 135 - 145 mmol/L 145 147(H) 146(H)  Potassium 3.5 - 5.1  mmol/L 3.3(L) 4.2 4.2  Chloride 98 - 111 mmol/L 109 - 107  CO2 22 - 32 mmol/L 25 - 30  Calcium 8.9 - 10.3 mg/dL 7.0(L) - 8.4(L)  Total Protein 6.5 - 8.1 g/dL - - -  Total Bilirubin 0.3 - 1.2 mg/dL - - -  Alkaline Phos 38 - 126 U/L - - -  AST 15 - 41 U/L - - -  ALT 0 - 44 U/L - - -    CXR: pending  _______________________________________________________________  Assessment and Plan: POD 12 s/p Robotic 3 field esophagectomy.  Respiratory failure, cervical anastamotic leak  Neuro: pain controlled CV: sinus, continue amio gtt for now Pulm: discussed case with ICU team, trach and incision care will be challenging.  Will continue vent management for now.  No rush for trach.  Minimal CT output from either drain.  Loculation likely from anastomotic leak draining down.  Will keep both until pt is extubated. Renal: stable GI: receiving J tube feeds.   Heme: stable ID: wet to dry dressing to cervical incision.  Continue abx of asp  pneumonia Endo: SSI  Dispo: continue ICU care  Melodie Bouillon, MD 10/27/2019 7:25 PM

## 2019-10-27 NOTE — Progress Notes (Signed)
Pharmacy Antibiotic Note  [name] is a 69 y.o. female admitted on 10/17/2019 with sepsis.  Rising temp and growing Enterobacter cloacae + aerogenes  Pharmacy has been consulted for cefepime, fluconazole, and metronidazole dosing.    Plan: Cefepime 2g IV q 12 hrs Metronidazole 500 mg IV q 8 hrs Fluconazole 200 mg IV q 24 hrs  Height: _0  (157.5 cm) Weight: 72.9 kg (160 lb 11.5 oz) IBW/kg (Calculated) : 50.1  Temp (24hrs), Avg:99 F (37.2 C), Min:98.1 F (36.7 C), Max:100.5 F (38.1 C)  Recent Labs  Lab 10/23/19 0350 10/24/19 0337 10/25/19 0333 10/26/19 0412 10/27/19 0239  WBC 21.8* 13.6* 15.3* 16.5* 18.0*  CREATININE 1.11* 0.88 0.93 1.10* 1.13*    Estimated Creatinine Clearance: 43.3 mL/min (A) (by C-G formula based on SCr of 1.13 mg/dL (H)).    Allergies  Allergen Reactions  . Honey Bee Venom Protein [Bee Venom] Anaphylaxis  . Ether Nausea And Vomiting  . Nickel Other (See Comments)    infection  . Other Other (See Comments)    Pt reports that she cannot have staples placed due to infection.  . Tape Rash    Patient states that she has an allergic reaction to certain tapes. She states that she can tolerate paper tape.    Antimicrobials this admission: Zosyn 10/14>>   Dose adjustments this admission:  Microbiology results: Pleural fluid 10/18: enterobacter cloacae + enterobacter aerogenes (sens-zosyn) BAL cx 10/15: pan-sens pseudomonas Body fluid from JP drain 10/15: cancelled (micro never received)  Thank you for allowing pharmacy to be a part of this patient's care.   [place] Crane, Roylene Reason, BCCP Clinical Pharmacist  10/27/2019 3:44 PM   [facility] pharmacy phone numbers are listed on [email or web address]

## 2019-10-28 ENCOUNTER — Inpatient Hospital Stay (HOSPITAL_COMMUNITY): Payer: Medicare Other

## 2019-10-28 DIAGNOSIS — J9601 Acute respiratory failure with hypoxia: Secondary | ICD-10-CM | POA: Diagnosis not present

## 2019-10-28 DIAGNOSIS — C159 Malignant neoplasm of esophagus, unspecified: Secondary | ICD-10-CM | POA: Diagnosis not present

## 2019-10-28 LAB — BASIC METABOLIC PANEL WITH GFR
Anion gap: 9 (ref 5–15)
BUN: 36 mg/dL — ABNORMAL HIGH (ref 8–23)
CO2: 26 mmol/L (ref 22–32)
Calcium: 7.3 mg/dL — ABNORMAL LOW (ref 8.9–10.3)
Chloride: 115 mmol/L — ABNORMAL HIGH (ref 98–111)
Creatinine, Ser: 0.91 mg/dL (ref 0.44–1.00)
GFR, Estimated: >60 mL/min
Glucose, Bld: 181 mg/dL — ABNORMAL HIGH (ref 70–99)
Potassium: 3.7 mmol/L (ref 3.5–5.1)
Sodium: 150 mmol/L — ABNORMAL HIGH (ref 135–145)

## 2019-10-28 LAB — CBC
HCT: 29.2 % — ABNORMAL LOW (ref 36.0–46.0)
Hemoglobin: 8.5 g/dL — ABNORMAL LOW (ref 12.0–15.0)
MCH: 28.6 pg (ref 26.0–34.0)
MCHC: 29.1 g/dL — ABNORMAL LOW (ref 30.0–36.0)
MCV: 98.3 fL (ref 80.0–100.0)
Platelets: 324 K/uL (ref 150–400)
RBC: 2.97 MIL/uL — ABNORMAL LOW (ref 3.87–5.11)
RDW: 14.6 % (ref 11.5–15.5)
WBC: 15.6 K/uL — ABNORMAL HIGH (ref 4.0–10.5)
nRBC: 0 % (ref 0.0–0.2)

## 2019-10-28 LAB — TRIGLYCERIDES: Triglycerides: 163 mg/dL — ABNORMAL HIGH

## 2019-10-28 LAB — GLUCOSE, CAPILLARY
Glucose-Capillary: 103 mg/dL — ABNORMAL HIGH (ref 70–99)
Glucose-Capillary: 114 mg/dL — ABNORMAL HIGH (ref 70–99)
Glucose-Capillary: 118 mg/dL — ABNORMAL HIGH (ref 70–99)
Glucose-Capillary: 123 mg/dL — ABNORMAL HIGH (ref 70–99)
Glucose-Capillary: 126 mg/dL — ABNORMAL HIGH (ref 70–99)
Glucose-Capillary: 130 mg/dL — ABNORMAL HIGH (ref 70–99)

## 2019-10-28 LAB — MAGNESIUM: Magnesium: 2.2 mg/dL (ref 1.7–2.4)

## 2019-10-28 LAB — PROCALCITONIN: Procalcitonin: 0.9 ng/mL

## 2019-10-28 MED ORDER — POTASSIUM CHLORIDE 20 MEQ PO PACK
40.0000 meq | PACK | Freq: Once | ORAL | Status: AC
  Administered 2019-10-28: 40 meq
  Filled 2019-10-28: qty 2

## 2019-10-28 MED ORDER — IOHEXOL 300 MG/ML  SOLN
50.0000 mL | Freq: Once | INTRAMUSCULAR | Status: AC | PRN
  Administered 2019-10-28: 50 mL

## 2019-10-28 MED ORDER — FLUCONAZOLE IN SODIUM CHLORIDE 400-0.9 MG/200ML-% IV SOLN
400.0000 mg | INTRAVENOUS | Status: DC
  Administered 2019-10-28: 400 mg via INTRAVENOUS
  Filled 2019-10-28 (×2): qty 200

## 2019-10-28 MED ORDER — VITAL AF 1.2 CAL PO LIQD
1000.0000 mL | ORAL | Status: DC
  Administered 2019-10-28: 1000 mL

## 2019-10-28 NOTE — Progress Notes (Signed)
Patient ID: [name], female   DOB: March 13, 1950, 69 y.o.   MRN: [id number] TCTS Evening Rounds:  Afebrile, Hemodynamically stable on levophed for BP support.  Remains on vent 50% FiO2 Urine output ok

## 2019-10-28 NOTE — Progress Notes (Addendum)
NAME:  [name], MRN:  [id number], DOB:  1950/01/22, LOS: 62 ADMISSION DATE:  10/20/2019, CONSULTATION DATE: 10/20/2019 REFERRING MD:  Dr. Kipp Brood, CHIEF COMPLAINT:  SOB   Brief History   Rachel Lore. Flores is a 69 y/o female with a PMHx of esophageal squamous cell carcinoma admitted on 10/08 for esophagectomy with interval development of respiratory distress requiring BiPAP and transfer to ICU.   History of present illness   [name] to [facility] for admission for elective esophagectomy on 10/08 that proceeded without any complications. [name] is currently POD 6.   Overnight, [name] developed worsening work of breathing requiring NRB. [name] was noted to be diaphoretic with decreased lung sounds by the RN. Rapid response was called, who switched chest tube from water seal to suction. Air leak noted in [place]. CXR obtained overnight showed small right basilar pneumothorax with loculated pleural effusion, as well as interval development of extensive airspace infiltrate in the LUL. Repeat CXR this morning shows improvement in pneumothorax with no change in opacities. In the late morning, [name] developed worsening respiratory distress requiring BiPAP placement and transfer to ICU for close monitoring.  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 Fib,   Significant Hospital Events   10/08 >> Admitted to [facility] 10/14 >> Transferred to ICU for respiratory distress   Consults:  PCCM  Procedures:  10/08 >> Esophagectomy 10/15 >> ETT placed, Bronchoscopy     Significant Diagnostic Tests:  CXR 10/13 >> Small right basilar pneumothorax, however, has increased in size since prior examination and there has now developed a small amount of laterally loculated pleural fluid. Extensive airspace infiltrate has now developed throughout the left upper lobe, likely infectious in the appropriate clinical setting.   CXR 10/14  >> Right basilar  pneumothorax is smaller compared to earlier in the day. No tension component. Subcutaneous air present on the right. There is extensive airspace opacity throughout the left upper lobe and left perihilar regions, similar to earlier in the day. There is patchy atelectasis in the right mid lung and right base with suspected loculated pleural effusion on the right laterally  ABD xray 10/20 > Mild diffuse air distension of the bowel, either reflecting ileus or low-grade obstruction.  Micro Data:  10/18 Pleural culture - moderate GNR 10/15 Respiratory culture - Pan-senstive PA Wound culture 10/18 >   Antimicrobials:  Zosyn 10/14 >> 10/21 Cefepime 10/21 > Flagyl 10/21 >  Interim history/subjective:  Sedated on vent  Able to follow simple commands No acute events overnight   Antibiotics   Objective   Blood pressure (!) 129/47, pulse 81, temperature 98.9 F (37.2 C), temperature source Oral, resp. rate (!) 33, height 5\' 2"  (1.575 m), weight 72.9 kg, SpO2 95 %.    Vent Mode: PRVC FiO2 (%):  [50 %] 50 % Set Rate:  [26 bmp] 26 bmp Vt Set:  [300 mL] 300 mL PEEP:  [8 cmH20] 8 cmH20 Plateau Pressure:  [21 cmH20-27 cmH20] 25 cmH20   Intake/Output Summary (Last 24 hours) at 10/28/2019 0805 Last data filed at 10/28/2019 0600 Gross per 24 hour  Intake 3686.58 ml  Output 1720 ml  Net 1966.58 ml   Filed Weights   10/25/19 0500 10/26/19 0700 10/27/19 0345  Weight: 71.8 kg 72 kg 72.9 kg   Physical Exam: General: Acute on chronically ill appearing elderly female on mechanical ventilation, in NAD HEENT: ETT, MM pink/moist, PERRL,  Neuro: Sedated on vent, able  to follow simple commands CV: s1s2 regular rate and rhythm, no murmur, rubs, or gallops,  PULM:  Tolerating vent well, no added breath sounds, no increased work of breathing GI: soft, bowel sounds active in all 4 quadrants, non-tender, non-distended Extremities: warm/dry, no edema  Skin: no rashes or lesions     Resolved Hospital  Problem list   AKI  Assessment & Plan:  Acute hypoxemic and hypercarbic Respiratory Failure  -Reintubated on 10/17. Family agreeable to trach when team is ready. However post-trach care poses a challenge due to the anastomotic leak. Discussed with CTS. For now, will keep [name] intubated for now Hx of Asthma  P: Continue ventilator support with lung protective strategies  Wean PEEP and FiO2 for sats greater than 90%. Head of bed elevated 30 degrees. Plateau pressures less than 30 cm H20.  Follow intermittent chest x-ray and ABG.   SAT/SBT as tolerated, mentation preclude extubation  Ensure adequate pulmonary hygiene  Follow cultures  VAP bundle in place  PAD protocol Continue to follow for trach plan  Continue IV Cefepime and Flagyl  Sepsis with shock -Multiple sources including loculated pleural effusion after anastomotic leak from esophagectomy Loculated empyema secondary to anastomotic leak -S/p chest tube for source control -Culture positive for Enterobacter 10/18  Aspiration Pneumonia/Pseudomonas Aeruginosa HCAP Right Pneumothorax  -Resolved with tube thoracostomy P: Given persistent leukocytosis and positive cultures antibiotic regimen adjusted 10/21 to include discontinuation of Zosyn and initiation of cefepime and Flagyl Antifungal regimen also initiated 10/21 Fungitell sent Follow repeat cultures Procalcitonin is slightly elevated at 0.90 Continue pressor support Monitor hemodynamics closely in the ICU setting  Sinus Tachycardia Hx of Atrial Fibrillation  P: Continue IV amiodarone Continue Eliquis Continuous telemetry  Esophageal Squamous Cell Carcinoma s/p Esophagectomy c/b anastomotic leak - Management of incision site per TCTS  P: Supportive care  Early developing Ileus  -ABD xray 10/20 with mild diffuse air distension of the bowel, either reflecting ileus or low-grade obstruction. P: Continue Reglan Continue bowel regiment Closely monitor ability  for resumption of tube feeds  Non-obstructive CAD LAD saccular aneurysm  HTN  P: Home Plavix and Imdur remain on hold Continue aspirin Follow for ability to resume home antihypertensives  Hx of Hypothyroidism  P: Continue daily thyroid supplementation  High TG P: Continues to improve off of propofol  Chronic normocytic anemia P: Follow CBC Transfuse for hemoglobin greater less than 7  Best practice:  Diet: Tube Feeds per G Tube  Pain/Anxiety/Delirium protocol (if indicated): wean off VAP protocol (if indicated): Ordered  DVT prophylaxis: full dose lovenox GI prophylaxis: Protonix Glucose control: SSI Mobility: up to chair Code Status: FULL Family Communication: Per primary.  Disposition: ICU  CRITICAL CARE Performed by: Johnsie Cancel  Total critical care time: 42 minutes  Critical care time was exclusive of separately billable procedures and treating other patients.  Critical care was necessary to treat or prevent imminent or life-threatening deterioration.  Critical care was time spent personally by me on the following activities: development of treatment plan with [name] and/or surrogate as well as nursing, discussions with consultants, evaluation of [name]'s response to treatment, examination of [name], obtaining history from [name] or surrogate, ordering and performing treatments and interventions, ordering and review of laboratory studies, ordering and review of radiographic studies, pulse oximetry and re-evaluation of [name]'s condition.  Johnsie Cancel, NP-C [facility] Contact / Pager information can be found on Amion  10/28/2019, 8:05 AM  Pulmonary critical care attending:  69 year old  female past medical history of esophageal squamous cell carcinoma status post esophagectomy. [name] developed postoperative respiratory failure following an aspiration event on the floor. [name] was intubated. [name] was extubated last week and  reintubated again.  No issues overnight. Fevers stable. Remains on antimicrobials. Critically ill on mechanical life support.  BP (!) 131/52   Pulse 91   Temp 99.5 F (37.5 C) (Oral)   Resp (!) 36   Ht 5\' 2"  (1.575 m)   Wt 72.9 kg   SpO2 93%   BMI 29.40 kg/m   General: Elderly female intubated mechanical life support HEENT: NCAT, alert to voice, wet-to-dry dressings for incision on neck. Heart: Regular rhythm S1-S2 Lungs: Bilateral mechanically Breath sounds Abdomen: Soft nondistended.  Labs: Reviewed Chest x-ray: Last chest film reviewed on 10/24/2019. Effusion on the right. The [name]'s images have been independently reviewed by me.    Assessment: Acute hypoxemic hypercarbic respiratory failure requiring intubation and mechanical ventilation In the postoperative setting from esophagectomy, aspiration event Complicated by anastomotic leak with a loculated empyema Septic shock secondary to above., Likely polymicrobial sepsis History of atrial fibrillation, remains on Eliquis History of esophageal squamous cell carcinoma status post esophagectomy complicated by anastomotic leak currently managed by cardiothoracic surgery. Concern for ileus History of nonobstructive CAD, history of LAD saccular aneurysm, hypertension.  Plan: [name] remains on full mechanical vent support. Low tidal volume ventilation strategy. Wean PEEP and FiO2 to maintain sats greater than 90%. Head of bed elevated 30 degrees Continue to observe plateau pressures. SAT SBT as tolerated. Continue cefepime plus Flagyl plus fluconazole Continue IV amiodarone plus Eliquis for A. fib. Continue Plavix plus Imdur with history of CAD.  At this time not ready for liberation from mechanical support.  This [name] is critically ill with multiple organ system failure; which, requires frequent high complexity decision making, assessment, support, evaluation, and titration of therapies. This was completed through  the application of advanced monitoring technologies and extensive interpretation of multiple databases. During this encounter critical care time was devoted to [name] care services described in this note for 33 minutes.  Garner Nash, DO [facility] 10/28/2019 1:55 PM

## 2019-10-28 NOTE — Progress Notes (Signed)
[address]       [place] [address]             [phone]                 14 Days Post-Op Procedure(s) (LRB): XI ROBOTIC ASSISTED [place] ESOPHAGECTOMY USING NIMS (N/A) VIDEO BRONCHOSCOPY (N/A) JEJUNOSTOMY PLACEMENT (N/A) INTERCOSTAL NERVE BLOCK (Right)   Events: Remains intubated J-tube leakage _______________________________________________________________ Vitals: BP (!) 158/55   Pulse (!) 114   Temp 99.5 F (37.5 C) (Oral)   Resp (!) 33   Ht 5\' 2"  (1.575 m)   Wt 72.9 kg   SpO2 93%   BMI 29.40 kg/m   - Neuro: sedated  - Cardiovascular: sinus  Drips: amio.   CVP:  [5 mmHg-6 mmHg] 5 mmHg  - Pulm:  Vent Mode: PRVC FiO2 (%):  [50 %] 50 % Set Rate:  [26 bmp] 26 bmp Vt Set:  [300 mL] 300 mL PEEP:  [8 cmH20] 8 cmH20 Plateau Pressure:  [21 cmH20-25 cmH20] 24 cmH20  ABG    Component Value Date/Time   PHART 7.372 10/26/2019 0749   PCO2ART 54.4 (H) 10/26/2019 0749   PO2ART 105 10/26/2019 0749   HCO3 31.4 (H) 10/26/2019 0749   TCO2 33 (H) 10/26/2019 0749   ACIDBASEDEF 7.8 (H) 10/20/2019 2050   O2SAT 98.0 10/26/2019 0749    - Abd: soft.  J tube in place - Extremity: warm  .Intake/Output      10/21 0701 - 10/22 0700 10/22 0701 - 10/23 0700   I.V. (mL/kg) 1679.6 (23) 513.5 (7)   Other 60 10   NG/GT 1383.8    IV Piggyback 563.1 100   Total Intake(mL/kg) 3686.6 (50.6) 623.5 (8.6)   Urine (mL/kg/hr) 1210 (0.7) 250 (0.4)   Drains 500    Stool 0 10   Chest Tube 10 0   Total Output 1720 260   Net +1966.6 +363.5           _______________________________________________________________ Labs: CBC Latest Ref Rng & Units 10/28/2019 10/27/2019 10/26/2019  WBC 4.0 - 10.5 K/uL 15.6(H) 18.0(H) -  Hemoglobin 12.0 - 15.0 g/dL 8.5(L) 8.8(L) 9.9(L)  Hematocrit 36 - 46 % 29.2(L) 29.8(L) 29.0(L)  Platelets 150 - 400 K/uL 324 326 -   CMP Latest Ref Rng & Units 10/28/2019 10/27/2019 10/26/2019  Glucose 70 - 99 mg/dL 181(H) 231(H) -  BUN 8  - 23 mg/dL 36(H) 40(H) -  Creatinine 0.44 - 1.00 mg/dL 0.91 1.13(H) -  Sodium 135 - 145 mmol/L 150(H) 145 147(H)  Potassium 3.5 - 5.1 mmol/L 3.7 3.3(L) 4.2  Chloride 98 - 111 mmol/L 115(H) 109 -  CO2 22 - 32 mmol/L 26 25 -  Calcium 8.9 - 10.3 mg/dL 7.3(L) 7.0(L) -  Total Protein 6.5 - 8.1 g/dL - - -  Total Bilirubin 0.3 - 1.2 mg/dL - - -  Alkaline Phos 38 - 126 U/L - - -  AST 15 - 41 U/L - - -  ALT 0 - 44 U/L - - -      _______________________________________________________________  Assessment and Plan: POD 13 s/p Robotic 3 field esophagectomy.  Respiratory failure, cervical anastamotic leak  Neuro: pain controlled CV: sinus, continue amio gtt for now.  On neo at 38 for sepsis Pulm: discussed case with ICU team, trach and incision care will be challenging.  Will continue vent management for now.  No rush for trach.  Minimal CT output from either drain.  Loculation likely  from anastomotic leak draining down.  Will keep both until pt is extubated. Renal: stable GI: J tube feeds on hold for presumed ileus.  KUB shows good position of J tube.  Would consider CT abdomen to eval for soft tissue abscess if continued drainage   Heme: stable ID: wet to dry dressing to cervical incision.  Continue abx of asp pneumonia Endo: SSI  Dispo: continue ICU care  Melodie Bouillon, MD 10/28/2019 4:36 PM

## 2019-10-28 NOTE — Progress Notes (Signed)
Physical Therapy Treatment Patient Details Name: [name] MRN: [id number] DOB: 1950-11-24 Today's Date: 10/28/2019    History of Present Illness 69 yo with history of esophageal CA admitted for esophagectomy with jejunostomy placement Pt with respiratory distress on 10/14, transferred to ICU and intubated on 10/15 with one failed attempt to extubate. Additional PMHx: CoPD, Asthma, HTN, HLD, hypothyroidism, CAD    PT Comments    Pt admitted with above diagnosis. Pt was able to sit EOB x 15 min and perform exercises with min guard to mod assist to sit EOB.  Pt making progress.  Met 0/4 goals due to pt with 2 intubations after goals set.  Revised goals.  Pt currently with functional limitations due to balance and endurance deficits. Pt will benefit from skilled PT to increase their independence and safety with mobility to allow discharge to the venue listed below.     Follow Up Recommendations  CIR;Supervision/Assistance - 24 hour     Equipment Recommendations  Other (comment) (TBA)    Recommendations for Other Services Rehab consult     Precautions / Restrictions Precautions Precautions: Fall;Other (comment) Precaution Comments: 2 chest tubes, j tube, ETT Restrictions Weight Bearing Restrictions: No    Mobility  Bed Mobility Overal bed mobility: Needs Assistance Bed Mobility: Supine to Sit;Sit to Supine     Supine to sit: HOB elevated;Mod assist;Max assist;+2 for physical assistance Sit to supine: Mod assist;Max assist;+2 for physical assistance   General bed mobility comments: Pt needed assist for LES and for trunk to EOB.  Transfers                    Ambulation/Gait                 Stairs             Wheelchair Mobility    Modified Rankin (Stroke Patients Only)       Balance Overall balance assessment: Needs assistance Sitting-balance support: No upper extremity supported;Feet supported;Bilateral upper extremity supported Sitting  balance-Leahy Scale: Poor Sitting balance - Comments: relies on UE support for balance vs. support by therapist.  Varied assist given with pt having periods needing min guard assist and at times mod assist as she fatigued and initially.  Able to perform LE exercises while sitting.                                     Cognition Arousal/Alertness: Awake/alert Behavior During Therapy: WFL for tasks assessed/performed Overall Cognitive Status: Difficult to assess                                 General Comments: ETT, but answering yes/no questions      Exercises General Exercises - Upper Extremity Shoulder Flexion: Both;AAROM;5 reps;Seated Elbow Flexion: Both;AAROM;5 reps;Seated Elbow Extension: AAROM;Both;5 reps;Seated General Exercises - Lower Extremity Ankle Circles/Pumps: AROM;Both;10 reps;Seated Long Arc Quad: AROM;Both;10 reps;Seated Straight Leg Raises: Both;AAROM;5 reps;Seated    General Comments General comments (skin integrity, edema, etc.): VSS until end of treatment with pt desat to 88% at end of sitting.  Used O2 boost to 100% which incr O2 to 975.  Pt on vent with  FiO2 50%.       Pertinent Vitals/Pain      Home Living  Prior Function            PT Goals (current goals can now be found in the care plan section) Acute Rehab PT Goals Patient Stated Goal: return home PT Goal Formulation: With patient Time For Goal Achievement: 11/11/19 Potential to Achieve Goals: Good Progress towards PT goals: Goals downgraded-see care plan    Frequency    Min 3X/week      PT Plan Discharge plan needs to be updated    Co-evaluation              AM-PAC PT "6 Clicks" Mobility   Outcome Measure  Help needed turning from your back to your side while in a flat bed without using bedrails?: A Little Help needed moving from lying on your back to sitting on the side of a flat bed without using bedrails?: A  Lot Help needed moving to and from a bed to a chair (including a wheelchair)?: Total Help needed standing up from a chair using your arms (e.g., wheelchair or bedside chair)?: Total Help needed to walk in hospital room?: Total Help needed climbing 3-5 steps with a railing? : Total 6 Click Score: 9    End of Session Equipment Utilized During Treatment: Other (comment) (Vent) Activity Tolerance: Patient tolerated treatment well;Patient limited by fatigue Patient left: with call bell/phone within reach;in bed;with bed alarm set;with SCD's reapplied;with restraints reapplied (with pt in chair position) Nurse Communication: Mobility status PT Visit Diagnosis: Other abnormalities of gait and mobility (R26.89);Difficulty in walking, not elsewhere classified (R26.2);Muscle weakness (generalized) (M62.81)     Time: 4932-4199 PT Time Calculation (min) (ACUTE ONLY): 33 min  Charges:  $Therapeutic Exercise: 8-22 mins $Therapeutic Activity: 8-22 mins                     Vincentina Sollers W,PT Acute Rehabilitation Services Pager:  [phone]  Office:  [place] 10/28/2019, 4:11 PM

## 2019-10-28 NOTE — Progress Notes (Signed)
Pharmacy Antibiotic Note  YOCELINE BAZAR is a 69 y.o. female admitted on 10/26/2019 with sepsis.  Rising temp and growing Enterobacter cloacae + aerogenes  Pharmacy has been consulted for cefepime, fluconazole, and metronidazole dosing.  Patient afebrile today. WBC down slightly to 15.6. Renal function improved with Scr down 0.91. Will adjust doses accordingly.  Plan: Increase fluconazole to 400 mg IV q24 hrs Continue Cefepime 2g IV q 12 hrs Continue Metronidazole 500 mg IV q 8 hrs Monitor renal function, clinical progression  Height: _0  (157.5 cm) Weight: 72.9 kg (160 lb 11.5 oz) IBW/kg (Calculated) : 50.1  Temp (24hrs), Avg:99 F (37.2 C), Min:97.6 F (36.4 C), Max:100.5 F (38.1 C)  Recent Labs  Lab 10/24/19 0337 10/25/19 0333 10/26/19 0412 10/27/19 0239 10/27/19 1600 10/27/19 1900 10/28/19 0357  WBC 13.6* 15.3* 16.5* 18.0*  --   --  15.6*  CREATININE 0.88 0.93 1.10* 1.13*  --   --  0.91  LATICACIDVEN  --   --   --   --  1.5 1.3  --     Estimated Creatinine Clearance: 53.8 mL/min (by C-G formula based on SCr of 0.91 mg/dL).    Allergies  Allergen Reactions  . Honey Bee Venom Protein [Bee Venom] Anaphylaxis  . Ether Nausea And Vomiting  . Nickel Other (See Comments)    infection  . Other Other (See Comments)    Pt reports that she cannot have staples placed due to infection.  . Tape Rash    Patient states that she has an allergic reaction to certain tapes. She states that she can tolerate paper tape.    Antimicrobials this admission: Zosyn 10/14>> 10/21 Cefepime 10/21>> Flagyl 10/21>> Fluconazole 10/21>>  Dose adjustments this admission:  Microbiology results: Pleural fluid 10/18: enterobacter cloacae + enterobacter aerogenes (S: cefepime) BAL cx 10/15: pan-sens pseudomonas Body fluid from JP drain 10/15: cancelled (micro never received)  Richardine Service, PharmD PGY2 Cardiology Pharmacy Resident Phone: [phone] 10/28/2019  9:40 AM  Please check  [email or web address] for unit-specific pharmacy phone numbers.

## 2019-10-29 DIAGNOSIS — C159 Malignant neoplasm of esophagus, unspecified: Secondary | ICD-10-CM | POA: Diagnosis not present

## 2019-10-29 DIAGNOSIS — J9601 Acute respiratory failure with hypoxia: Secondary | ICD-10-CM | POA: Diagnosis not present

## 2019-10-29 LAB — AEROBIC/ANAEROBIC CULTURE W GRAM STAIN (SURGICAL/DEEP WOUND)

## 2019-10-29 LAB — GLUCOSE, CAPILLARY
Glucose-Capillary: 125 mg/dL — ABNORMAL HIGH (ref 70–99)
Glucose-Capillary: 128 mg/dL — ABNORMAL HIGH (ref 70–99)
Glucose-Capillary: 130 mg/dL — ABNORMAL HIGH (ref 70–99)
Glucose-Capillary: 137 mg/dL — ABNORMAL HIGH (ref 70–99)
Glucose-Capillary: 137 mg/dL — ABNORMAL HIGH (ref 70–99)
Glucose-Capillary: 141 mg/dL — ABNORMAL HIGH (ref 70–99)

## 2019-10-29 LAB — BASIC METABOLIC PANEL
Anion gap: 10 (ref 5–15)
BUN: 39 mg/dL — ABNORMAL HIGH (ref 8–23)
CO2: 25 mmol/L (ref 22–32)
Calcium: 7.6 mg/dL — ABNORMAL LOW (ref 8.9–10.3)
Chloride: 117 mmol/L — ABNORMAL HIGH (ref 98–111)
Creatinine, Ser: 0.99 mg/dL (ref 0.44–1.00)
GFR, Estimated: >60 mL/min (ref >60)
Glucose, Bld: 219 mg/dL — ABNORMAL HIGH (ref 70–99)
Potassium: 3.7 mmol/L (ref 3.5–5.1)
Sodium: 152 mmol/L — ABNORMAL HIGH (ref 135–145)

## 2019-10-29 LAB — CBC
HCT: 27.9 % — ABNORMAL LOW (ref 36.0–46.0)
Hemoglobin: 7.9 g/dL — ABNORMAL LOW (ref 12.0–15.0)
MCH: 28.6 pg (ref 26.0–34.0)
MCHC: 28.3 g/dL — ABNORMAL LOW (ref 30.0–36.0)
MCV: 101.1 fL — ABNORMAL HIGH (ref 80.0–100.0)
Platelets: 269 10*3/uL (ref 150–400)
RBC: 2.76 MIL/uL — ABNORMAL LOW (ref 3.87–5.11)
RDW: 14.9 % (ref 11.5–15.5)
WBC: 13.2 10*3/uL — ABNORMAL HIGH (ref 4.0–10.5)
nRBC: 0 % (ref 0.0–0.2)

## 2019-10-29 LAB — TRIGLYCERIDES: Triglycerides: 133 mg/dL (ref <150)

## 2019-10-29 LAB — PROCALCITONIN: Procalcitonin: 0.85 ng/mL

## 2019-10-29 MED ORDER — POTASSIUM CHLORIDE 20 MEQ/15ML (10%) PO SOLN
20.0000 meq | ORAL | Status: AC
  Administered 2019-10-29 (×2): 20 meq
  Filled 2019-10-29 (×2): qty 15

## 2019-10-29 MED ORDER — FLUCONAZOLE IN SODIUM CHLORIDE 400-0.9 MG/200ML-% IV SOLN
400.0000 mg | INTRAVENOUS | Status: AC
  Administered 2019-10-29 – 2019-11-02 (×5): 400 mg via INTRAVENOUS
  Filled 2019-10-29 (×5): qty 200

## 2019-10-29 MED ORDER — FLUCONAZOLE IN SODIUM CHLORIDE 400-0.9 MG/200ML-% IV SOLN: 400.0000 mg | INTRAVENOUS | Status: DC

## 2019-10-29 NOTE — Plan of Care (Signed)
  Problem: Education: Goal: Knowledge of General Education information will improve Description: Including pain rating scale, medication(s)/side effects and non-pharmacologic comfort measures Outcome: Progressing   Problem: Clinical Measurements: Goal: Ability to maintain clinical measurements within normal limits will improve Outcome: Progressing Goal: Will remain free from infection Outcome: Progressing Goal: Diagnostic test results will improve Outcome: Progressing Goal: Respiratory complications will improve Outcome: Progressing Goal: Cardiovascular complication will be avoided Outcome: Progressing   Problem: Activity: Goal: Risk for activity intolerance will decrease Outcome: Progressing   Problem: Coping: Goal: Level of anxiety will decrease Outcome: Progressing   Problem: Elimination: Goal: Will not experience complications related to urinary retention Outcome: Progressing   Problem: Pain Managment: Goal: General experience of comfort will improve Outcome: Progressing   Problem: Safety: Goal: Ability to remain free from injury will improve Outcome: Progressing   Problem: Skin Integrity: Goal: Risk for impaired skin integrity will decrease Outcome: Progressing   Problem: Health Behavior/Discharge Planning: Goal: Ability to manage health-related needs will improve Outcome: Not Progressing   Problem: Nutrition: Goal: Adequate nutrition will be maintained Outcome: Not Progressing   Problem: Elimination: Goal: Will not experience complications related to bowel motility Outcome: Not Progressing

## 2019-10-29 NOTE — Progress Notes (Signed)
PHARMACY - TOTAL PARENTERAL NUTRITION CONSULT NOTE  Indication: Intolerance to EN / Prolonged ileus  Patient Measurements: Height: 5\' 2"  (157.5 cm) Weight: 76.6 kg (168 lb 14 oz) IBW/kg (Calculated) : 50.1 TPN AdjBW (KG): 56.8 Body mass index is 30.89 kg/m.  Weight = 73 kg on admit  Assessment:  [name] presented on 10/24/2019 for bronch, esophagectomy and J-tube placement due to esophageal cancer.  Patient was started on EN post-op on 10/15/19 and tolerated goal rate.  She also passed her swallow evaluation and started on a CLD with nocturnal TF.  She unfortunately developed right PTX and subsequently require intubation, transfer to the ICU, and chest tube placement.  TF has been interrupted frequently since 10/24/19 due to drainage around J-tube and suspected ileus vs pSBO.  Pharmacy consulted for TPN management.  Glucose / Insulin: hx DM - CBGs well controlled Required 12 units SSI and Levemir 10/d while on TF Electrolytes: Na/CL 152/117, K 3.7 (25mEq x 3 doses), others WNL Renal: SCr 0.99, BUN 39 LFTs / TGs: LFTs / tbili WNL.  TG 300s > 133 on 10/23 (Propofol 10/15 >> 10/19) Prealbumin / albumin: albumin 2.4 Intake / Output; MIVF: UOP 0.5 ml/kg/hr, stool 129mL, net +12.6L (weight 73 > 76.6 kg) GI Imaging: none since TPN Surgeries / Procedures: none since TPN  Central access: PICC placed 10/21/19 TPN start date: 10/30/19  Nutritional Goals (per RD rec on 10/21): 1558 kCal, 100-120gm protein, >/= 1.5L fluid per day  Current Nutrition:  NPO  Plan:  Start TPN 10/24 as consult was received after hours Standard TPN labs and nursing care orders Continue CVTS SSI Q4H Sent msg to CVTS MD, consider D/C QHS Levemir since off TF and I will add insulin to TPN when it starts.  [name], PharmD, BCPS, [place] 10/29/2019, 1:35 PM

## 2019-10-29 NOTE — Progress Notes (Signed)
15 Days Post-Op Procedure(s) (LRB): XI ROBOTIC ASSISTED [place] ESOPHAGECTOMY USING NIMS (N/A) VIDEO BRONCHOSCOPY (N/A) JEJUNOSTOMY PLACEMENT (N/A) INTERCOSTAL NERVE BLOCK (Right) Subjective: Intubated on vent.  Nurse notes a lot of drainage around the J-tube that looks like tube feeds and drainage back through the tube when opened.  Objective: Vital signs in last 24 hours: Temp:  [97.8 F (36.6 C)-98.7 F (37.1 C)] 97.8 F (36.6 C) (10/23 1201) Pulse Rate:  [70-123] 97 (10/23 1215) Cardiac Rhythm: Normal sinus rhythm (10/23 0400) Resp:  [13-36] 24 (10/23 1215) BP: (89-164)/(42-74) 118/47 (10/23 1215) SpO2:  [80 %-99 %] 91 % (10/23 1215) FiO2 (%):  [50 %] 50 % (10/23 1136) Weight:  [76.6 kg] 76.6 kg (10/23 0344)  Hemodynamic parameters for last 24 hours:    Intake/Output from previous day: 10/22 0701 - 10/23 0700 In: 2086.6 [I.V.:1226.2; NG/GT:172.7; IV Piggyback:607.7] Out: 1610 [Urine:925; Stool:110; Chest Tube:7] Intake/Output this shift: No intake/output data recorded.  General appearance: sedated but looking around Neurologic: moves extremities to command Heart: regular rate and rhythm Lungs: clear to auscultation bilaterally Abdomen: hypoactive BS, non-tender Extremities: edema mild Wound: nurse reports neck wound clean  Lab Results: Recent Labs    10/28/19 0357 10/29/19 0339  WBC 15.6* 13.2*  HGB 8.5* 7.9*  HCT 29.2* 27.9*  PLT 324 269   BMET:  Recent Labs    10/28/19 0357 10/29/19 0339  NA 150* 152*  K 3.7 3.7  CL 115* 117*  CO2 26 25  GLUCOSE 181* 219*  BUN 36* 39*  CREATININE 0.91 0.99  CALCIUM 7.3* 7.6*    PT/INR: No results for input(s): LABPROT, INR in the last 72 hours. ABG    Component Value Date/Time   PHART 7.372 10/26/2019 0749   HCO3 31.4 (H) 10/26/2019 0749   TCO2 33 (H) 10/26/2019 0749   ACIDBASEDEF 7.8 (H) 10/23/2019 2050   O2SAT 98.0 10/26/2019 0749   CBG (last 3)  Recent Labs    10/29/19 0317 10/29/19 0754  10/29/19 1150  GLUCAP 128* 125* 141*    Assessment/Plan: S/P Procedure(s) (LRB): XI ROBOTIC ASSISTED [place] ESOPHAGECTOMY USING NIMS (N/A) VIDEO BRONCHOSCOPY (N/A) JEJUNOSTOMY PLACEMENT (N/A) INTERCOSTAL NERVE BLOCK (Right)  Continues to require low dose NE to support BP. Maintaining sinus on IV amio.  VDRF due to aspiration pneumonia: CCM following.   Not tolerating tube feeds. Question if there is partial SBO vs ileus. May need small bowel study via J-tube. Will hold tube feeds for now and start TNA.  Continue antibiotic for pneumonia.   LOS: 15 days    Gaye Pollack 10/29/2019

## 2019-10-29 NOTE — Plan of Care (Signed)
  Problem: Education: Goal: Knowledge of General Education information will improve Description: Including pain rating scale, medication(s)/side effects and non-pharmacologic comfort measures 10/29/2019 0100 by Patton Salles, RN Outcome: Progressing 10/29/2019 0059 by Patton Salles, RN Outcome: Progressing   Problem: Health Behavior/Discharge Planning: Goal: Ability to manage health-related needs will improve 10/29/2019 0100 by Patton Salles, RN Outcome: Progressing 10/29/2019 0059 by Patton Salles, RN Outcome: Progressing   Problem: Clinical Measurements: Goal: Ability to maintain clinical measurements within normal limits will improve 10/29/2019 0100 by Patton Salles, RN Outcome: Progressing 10/29/2019 0059 by Patton Salles, RN Outcome: Progressing Goal: Will remain free from infection 10/29/2019 0100 by Patton Salles, RN Outcome: Progressing 10/29/2019 0059 by Patton Salles, RN Outcome: Progressing Goal: Diagnostic test results will improve 10/29/2019 0100 by Patton Salles, RN Outcome: Progressing 10/29/2019 0059 by Patton Salles, RN Outcome: Progressing Goal: Respiratory complications will improve 10/29/2019 0100 by Patton Salles, RN Outcome: Progressing 10/29/2019 0059 by Patton Salles, RN Outcome: Progressing Goal: Cardiovascular complication will be avoided 10/29/2019 0100 by Patton Salles, RN Outcome: Progressing 10/29/2019 0059 by Patton Salles, RN Outcome: Progressing   Problem: Activity: Goal: Risk for activity intolerance will decrease 10/29/2019 0100 by Patton Salles, RN Outcome: Progressing 10/29/2019 0059 by Patton Salles, RN Outcome: Progressing   Problem: Nutrition: Goal: Adequate nutrition will be maintained 10/29/2019 0100 by Patton Salles, RN Outcome: Progressing 10/29/2019 0059 by Patton Salles, RN Outcome: Progressing   Problem: Coping: Goal: Level of anxiety will decrease 10/29/2019 0100 by Patton Salles, RN Outcome:  Progressing 10/29/2019 0059 by Patton Salles, RN Outcome: Progressing   Problem: Elimination: Goal: Will not experience complications related to bowel motility 10/29/2019 0100 by Patton Salles, RN Outcome: Progressing 10/29/2019 0059 by Patton Salles, RN Outcome: Progressing Goal: Will not experience complications related to urinary retention 10/29/2019 0100 by Patton Salles, RN Outcome: Progressing 10/29/2019 0059 by Patton Salles, RN Outcome: Progressing   Problem: Pain Managment: Goal: General experience of comfort will improve 10/29/2019 0100 by Patton Salles, RN Outcome: Progressing 10/29/2019 0059 by Patton Salles, RN Outcome: Progressing   Problem: Safety: Goal: Ability to remain free from injury will improve 10/29/2019 0100 by Patton Salles, RN Outcome: Progressing 10/29/2019 0059 by Patton Salles, RN Outcome: Progressing   Problem: Skin Integrity: Goal: Risk for impaired skin integrity will decrease 10/29/2019 0100 by Patton Salles, RN Outcome: Progressing 10/29/2019 0059 by Patton Salles, RN Outcome: Progressing

## 2019-10-29 NOTE — Progress Notes (Signed)
NAME:  [name], MRN:  [id number], DOB:  Oct 25, 1950, LOS: 28 ADMISSION DATE:  11/06/2019, CONSULTATION DATE: 10/20/2019 REFERRING MD:  Dr. Kipp Brood, CHIEF COMPLAINT:  SOB   Brief History   [name] is a 69 y/o female with a PMHx of esophageal squamous cell carcinoma admitted on 10/08 for esophagectomy with interval development of respiratory distress requiring BiPAP and transfer to ICU.   History of present illness   [name] presented to [facility] for admission for elective esophagectomy on 10/08 that proceeded without any complications. She is currently POD 6.   Overnight, patient developed worsening work of breathing requiring NRB. She was noted to be diaphoretic with decreased lung sounds by the RN. Rapid response was called, who switched chest tube from water seal to suction. Air leak noted in [name]. CXR obtained overnight showed small right basilar pneumothorax with loculated pleural effusion, as well as interval development of extensive airspace infiltrate in the LUL. Repeat CXR this morning shows improvement in pneumothorax with no change in opacities. In the late morning, patient developed worsening respiratory distress requiring BiPAP placement and transfer to ICU for close monitoring.  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 Fib,   Significant Hospital Events   10/08 >> Admitted to [facility] 10/14 >> Transferred to ICU for respiratory distress   Consults:  PCCM  Procedures:  10/08 >> Esophagectomy 10/15 >> ETT placed, Bronchoscopy     Significant Diagnostic Tests:  CXR 10/13 >> Small right basilar pneumothorax, however, has increased in size since prior examination and there has now developed a small amount of laterally loculated pleural fluid. Extensive airspace infiltrate has now developed throughout the left upper lobe, likely infectious in the appropriate clinical setting.   CXR 10/14  >> Right basilar  pneumothorax is smaller compared to earlier in the day. No tension component. Subcutaneous air present on the right. There is extensive airspace opacity throughout the left upper lobe and left perihilar regions, similar to earlier in the day. There is patchy atelectasis in the right mid lung and right base with suspected loculated pleural effusion on the right laterally  ABD xray 10/20 > Mild diffuse air distension of the bowel, either reflecting ileus or low-grade obstruction.  Micro Data:  10/18 Pleural culture - moderate GNR 10/15 Respiratory culture - Pan-senstive PA Wound culture 10/18 >   Antimicrobials:  Zosyn 10/14 >> 10/21 Cefepime 10/21 > Flagyl 10/21 >  Interim history/subjective:   Patient remains intubated critically ill on mechanical life support.  Objective   Blood pressure (!) 123/49, pulse 99, temperature 98.6 F (37 C), resp. rate (!) 21, height 5\' 2"  (1.575 m), weight 76.6 kg, SpO2 96 %. CVP:  [5 mmHg-6 mmHg] 5 mmHg  Vent Mode: PSV;CPAP FiO2 (%):  [50 %] 50 % Set Rate:  [26 bmp] 26 bmp Vt Set:  [300 mL] 300 mL PEEP:  [8 cmH20] 8 cmH20 Pressure Support:  [12 cmH20] 12 cmH20 Plateau Pressure:  [24 cmH20-25 cmH20] 24 cmH20   Intake/Output Summary (Last 24 hours) at 10/29/2019 0915 Last data filed at 10/29/2019 0600 Gross per 24 hour  Intake 2076.63 ml  Output 992 ml  Net 1084.63 ml   Filed Weights   10/26/19 0700 10/27/19 0345 10/29/19 0344  Weight: 72 kg 72.9 kg 76.6 kg   Physical Exam: General: Elderly female intubated on mechanical life support HEENT: Endotracheal tube in place, tracking appropriately with sedation lightened Neuro: Sedated on mechanical  life support CV: Regular rate rhythm, S1-S2 PULM: Bilateral mechanically ventilated breath sounds GI: Soft nontender nondistended Extremities: No significant edema Skin: No rash     Resolved Hospital Problem list   AKI  Assessment & Plan:   Acute hypoxemic and hypercarbic Respiratory  Failure  -Reintubated on 10/17. Family agreeable to trach when team is ready. However post-trach care poses a challenge due to the anastomotic leak. Discussed with CTS. For now, will keep patient intubated for now Hx of Asthma  P:  Continue full mechanical vent support, lung protective ventilation Wean PEEP and FiO2 as needed to maintain sats greater than 90%. Continue ventilator associated pneumonia prophylaxis head of bed elevated greater than 30 degrees. Chest x-ray as needed SAT SBT daily as tolerated. PAD for sedation guidelines. Continue cefepime plus Flagyl. I believe patient high risk for needing tracheostomy tube.  We will need to discuss with patient's family.  Especially if she was to fail extubation trial again.  Sepsis with shock -Multiple sources including loculated pleural effusion after anastomotic leak from esophagectomy Loculated empyema secondary to anastomotic leak -S/p chest tube for source control -Culture positive for Enterobacter 10/18  Aspiration Pneumonia/Pseudomonas Aeruginosa HCAP Right Pneumothorax  -Resolved with tube thoracostomy P: Continue current antibiotic regimen to include cefepime Flagyl and fluconazole. Was needing low-dose Levophed for maintenance of blood pressure We will use vasopressors to maintain mean arterial pressure greater than 65 as needed.  Sinus Tachycardia Hx of Atrial Fibrillation  P: Continue amiodarone, Eliquis Telemetry  Esophageal Squamous Cell Carcinoma s/p Esophagectomy c/b anastomotic leak - Management of incision site per TCTS  P: Supportive care  Early developing Ileus  -ABD xray 10/20 with mild diffuse air distension of the bowel, either reflecting ileus or low-grade obstruction. P: As needed bowel regimen Restarted tube feeds yesterday  Non-obstructive CAD LAD saccular aneurysm  HTN  P: Continue to hold Plavix, Imdur Continue aspirin Holding home BP meds  Hx of Hypothyroidism  P: Continue  levothyroxine  High TG P: Holding propofol  Chronic normocytic anemia P: Conservative management of hemoglobin Conservative transfusion threshold for hemoglobin less than 7. Continue to observe  Best practice:  Diet: Tube Feeds per G Tube  Pain/Anxiety/Delirium protocol (if indicated): wean off VAP protocol (if indicated): Ordered  DVT prophylaxis: full dose lovenox GI prophylaxis: Protonix Glucose control: SSI Mobility: up to chair Code Status: FULL Family Communication: Per primary.  Disposition: ICU  This patient is critically ill with multiple organ system failure; which, requires frequent high complexity decision making, assessment, support, evaluation, and titration of therapies. This was completed through the application of advanced monitoring technologies and extensive interpretation of multiple databases. During this encounter critical care time was devoted to patient care services described in this note for 32 minutes.  Garner Nash, DO [facility] 10/29/2019 9:15 AM

## 2019-10-29 NOTE — Progress Notes (Signed)
Patient ID: [name], female   DOB: 02/05/1950, 69 y.o.   MRN: [id number] TCTS Evening Rounds:  Hemodynamically stable  sats 96% on vent  Urine output ok  TNA to start tomorrow.

## 2019-10-30 ENCOUNTER — Inpatient Hospital Stay (HOSPITAL_COMMUNITY): Payer: Medicare Other

## 2019-10-30 DIAGNOSIS — J9601 Acute respiratory failure with hypoxia: Secondary | ICD-10-CM | POA: Diagnosis not present

## 2019-10-30 DIAGNOSIS — C159 Malignant neoplasm of esophagus, unspecified: Secondary | ICD-10-CM | POA: Diagnosis not present

## 2019-10-30 LAB — COMPREHENSIVE METABOLIC PANEL
ALT: 13 U/L (ref 0–44)
AST: 23 U/L (ref 15–41)
Albumin: 1.5 g/dL — ABNORMAL LOW (ref 3.5–5.0)
Alkaline Phosphatase: 49 U/L (ref 38–126)
Anion gap: 11 (ref 5–15)
BUN: 40 mg/dL — ABNORMAL HIGH (ref 8–23)
CO2: 22 mmol/L (ref 22–32)
Calcium: 8.1 mg/dL — ABNORMAL LOW (ref 8.9–10.3)
Chloride: 123 mmol/L — ABNORMAL HIGH (ref 98–111)
Creatinine, Ser: 0.95 mg/dL (ref 0.44–1.00)
GFR, Estimated: >60 mL/min (ref >60)
Glucose, Bld: 131 mg/dL — ABNORMAL HIGH (ref 70–99)
Potassium: 4.2 mmol/L (ref 3.5–5.1)
Sodium: 156 mmol/L — ABNORMAL HIGH (ref 135–145)
Total Bilirubin: 0.5 mg/dL (ref 0.3–1.2)
Total Protein: 5 g/dL — ABNORMAL LOW (ref 6.5–8.1)

## 2019-10-30 LAB — GLUCOSE, CAPILLARY
Glucose-Capillary: 122 mg/dL — ABNORMAL HIGH (ref 70–99)
Glucose-Capillary: 136 mg/dL — ABNORMAL HIGH (ref 70–99)
Glucose-Capillary: 109 mg/dL — ABNORMAL HIGH (ref 70–99)
Glucose-Capillary: 121 mg/dL — ABNORMAL HIGH (ref 70–99)
Glucose-Capillary: 142 mg/dL — ABNORMAL HIGH (ref 70–99)

## 2019-10-30 LAB — CBC
HCT: 30.2 % — ABNORMAL LOW (ref 36.0–46.0)
Hemoglobin: 8.8 g/dL — ABNORMAL LOW (ref 12.0–15.0)
MCH: 29 pg (ref 26.0–34.0)
MCHC: 29.1 g/dL — ABNORMAL LOW (ref 30.0–36.0)
MCV: 99.7 fL (ref 80.0–100.0)
Platelets: 284 10*3/uL (ref 150–400)
RBC: 3.03 MIL/uL — ABNORMAL LOW (ref 3.87–5.11)
RDW: 15.1 % (ref 11.5–15.5)
WBC: 17.5 10*3/uL — ABNORMAL HIGH (ref 4.0–10.5)
nRBC: 0.1 % (ref 0.0–0.2)

## 2019-10-30 LAB — PHOSPHORUS: Phosphorus: 3.1 mg/dL (ref 2.5–4.6)

## 2019-10-30 LAB — MAGNESIUM: Magnesium: 2.4 mg/dL (ref 1.7–2.4)

## 2019-10-30 LAB — TRIGLYCERIDES: Triglycerides: 130 mg/dL (ref <150)

## 2019-10-30 LAB — DIFFERENTIAL
Abs Immature Granulocytes: 0.12 10*3/uL — ABNORMAL HIGH (ref 0.00–0.07)
Basophils Absolute: 0.1 10*3/uL (ref 0.0–0.1)
Basophils Relative: 0 %
Eosinophils Absolute: 0 10*3/uL (ref 0.0–0.5)
Eosinophils Relative: 0 %
Immature Granulocytes: 1 %
Lymphocytes Relative: 2 %
Lymphs Abs: 0.3 10*3/uL — ABNORMAL LOW (ref 0.7–4.0)
Monocytes Absolute: 1 10*3/uL (ref 0.1–1.0)
Monocytes Relative: 6 %
Neutro Abs: 15.9 10*3/uL — ABNORMAL HIGH (ref 1.7–7.7)
Neutrophils Relative %: 91 %

## 2019-10-30 LAB — FUNGITELL, SERUM: Fungitell Result: <31 pg/mL (ref <80)

## 2019-10-30 LAB — PREALBUMIN: Prealbumin: <5 mg/dL — ABNORMAL LOW (ref 18–38)

## 2019-10-30 MED ORDER — TRAVASOL 10 % IV SOLN
INTRAVENOUS | Status: AC
  Filled 2019-10-30: qty 604.8

## 2019-10-30 MED ORDER — IOHEXOL 300 MG/ML  SOLN
100.0000 mL | Freq: Once | INTRAMUSCULAR | Status: AC | PRN
  Administered 2019-10-30: 100 mL via INTRAVENOUS

## 2019-10-30 MED ORDER — IOHEXOL 9 MG/ML PO SOLN
500.0000 mL | ORAL | Status: AC
  Administered 2019-10-30: 250 mL via ORAL

## 2019-10-30 MED ORDER — DEXMEDETOMIDINE HCL IN NACL 400 MCG/100ML IV SOLN
0.0000 ug/kg/h | INTRAVENOUS | Status: DC
  Administered 2019-10-30 (×2): 0.4 ug/kg/h via INTRAVENOUS
  Administered 2019-10-31: 0.5 ug/kg/h via INTRAVENOUS
  Administered 2019-11-02: 0.4 ug/kg/h via INTRAVENOUS
  Filled 2019-10-30 (×4): qty 100

## 2019-10-30 NOTE — Plan of Care (Signed)
  Problem: Coping: Goal: Level of anxiety will decrease Outcome: Progressing   Problem: Clinical Measurements: Goal: Will remain free from infection Outcome: Not Progressing Goal: Respiratory complications will improve Outcome: Not Progressing   Problem: Nutrition: Goal: Adequate nutrition will be maintained Outcome: Progressing   Problem: Pain Managment: Goal: General experience of comfort will improve Outcome: Progressing

## 2019-10-30 NOTE — Progress Notes (Signed)
eLink Physician-Brief Progress Note Patient Name: [name] DOB: 12/07/1950 MRN: [id number]   Date of Service  10/30/2019  HPI/Events of Note  Tried to extubate herself almost. Asking for CxR and restraints.  Camera eval: In synchrony. Stable Vitals now.  eICU Interventions  CxR stat and non violent bilateral soft wrist restraints ordered to prevent self injury and harm.      Intervention Category Intermediate Interventions: Other:;Respiratory distress - evaluation and management  Elmer Sow 10/30/2019, 5:20 AM

## 2019-10-30 NOTE — Progress Notes (Signed)
PHARMACY - TOTAL PARENTERAL NUTRITION CONSULT NOTE  Indication: Intolerance to EN / Prolonged ileus  Patient Measurements: Height: 5\' 2"  (157.5 cm) Weight: 76.9 kg (169 lb 8.5 oz) IBW/kg (Calculated) : 50.1 TPN AdjBW (KG): 56.8 Body mass index is 31.01 kg/m.  Weight = 73 kg on admit  Assessment:  [name] presented on 10/21/2019 for bronch, esophagectomy and J-tube placement due to esophageal cancer.  Patient was started on EN post-op on 10/15/19 and tolerated goal rate.  She also passed her swallow evaluation and started on a CLD with nocturnal TF.  She unfortunately developed right PTX and subsequently require intubation, transfer to the ICU, and chest tube placement.  TF has been interrupted frequently since 10/24/19 due to drainage around J-tube and suspected ileus vs pSBO.  Pharmacy consulted for TPN management.  Glucose / Insulin: no hx DM, A1c 5.8% - CBGs well controlled.   Required 10 units SSI (was on Levemir 10/d while on TF with controlled CBGs)  Electrolytes: Na/CL 156/123, K 4.2 (31mEq x 2 doses), others WNL (Mag high normal) Renal: SCr 0.95 stable, BUN 40 LFTs / TGs: LFTs / tbili WNL.  TG 300s > 130 (TG elevated while on Propofol 10/15 >> 10/19, ILE 20% of total kCal for now) Prealbumin / albumin: prealbumin < 5, albumin down 1.5 Intake / Output; MIVF: UOP 0.5 ml/kg/hr, drain O/P 222mL, stool 63mL, CT O/P 67mL, net +13.8L (weight 73 > 76.9 kg, pitting edema on hands only) GI Imaging: none since TPN Surgeries / Procedures: none since TPN  Central access: PICC placed 10/21/19 TPN start date: 10/30/19  Nutritional Goals (per RD rec on 10/21): 1558 kCal, 100-120gm protein, >/= 1.5L fluid per day  Current Nutrition:  NPO  Plan:  Start TPN at 40 ml/hr at 1800 (goal rate 75 ml/hr) TPN will provide 60g AA, 125g CHO and 16g ILE for a total of 829 kCal, meeting ~55% of needs Electrolytes in TPN:no Na, K 27mEq/L, Ca 54mEq/L, Mag 6mEq/L, Phos 15 mmol/L, max acetate Add standard MVI  and trace elements to TPN Continue CVTS SSI Q4H + add 10 units regular insulin in TPN F/U AM labs, volume status  [name] [name], PharmD, BCPS, [place] 10/30/2019, 7:49 AM

## 2019-10-30 NOTE — Progress Notes (Signed)
NAME:  [name], MRN:  [id number], DOB:  1950-04-09, LOS: 72 ADMISSION DATE:  10/13/2019, CONSULTATION DATE: 10/20/2019 REFERRING MD:  Dr. Kipp Brood, CHIEF COMPLAINT:  SOB   Brief History   [name] is a 69 y/o female with a PMHx of esophageal squamous cell carcinoma admitted on 10/08 for esophagectomy with interval development of respiratory distress requiring BiPAP and transfer to ICU.   History of present illness   Mrs. [name] presented to [facility] for admission for elective esophagectomy on 10/08 that proceeded without any complications. She is currently POD 6.   Overnight, patient developed worsening work of breathing requiring NRB. She was noted to be diaphoretic with decreased lung sounds by the RN. Rapid response was called, who switched chest tube from water seal to suction. Air leak noted in [place]. CXR obtained overnight showed small right basilar pneumothorax with loculated pleural effusion, as well as interval development of extensive airspace infiltrate in the LUL. Repeat CXR this morning shows improvement in pneumothorax with no change in opacities. In the late morning, patient developed worsening respiratory distress requiring BiPAP placement and transfer to ICU for close monitoring.  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 Fib,   Significant Hospital Events   10/08 >> Admitted to [facility] 10/14 >> Transferred to ICU for respiratory distress   Consults:  PCCM  Procedures:  10/08 >> Esophagectomy 10/15 >> ETT placed, Bronchoscopy     Significant Diagnostic Tests:  CXR 10/13 >> Small right basilar pneumothorax, however, has increased in size since prior examination and there has now developed a small amount of laterally loculated pleural fluid. Extensive airspace infiltrate has now developed throughout the left upper lobe, likely infectious in the appropriate clinical setting.   CXR 10/14  >> Right basilar  pneumothorax is smaller compared to earlier in the day. No tension component. Subcutaneous air present on the right. There is extensive airspace opacity throughout the left upper lobe and left perihilar regions, similar to earlier in the day. There is patchy atelectasis in the right mid lung and right base with suspected loculated pleural effusion on the right laterally  ABD xray 10/20 > Mild diffuse air distension of the bowel, either reflecting ileus or low-grade obstruction.  Micro Data:  10/18 Pleural culture - moderate GNR 10/15 Respiratory culture - Pan-senstive PA Wound culture 10/18 >   Antimicrobials:  Zosyn 10/14 >> 10/21 Cefepime 10/21 > Flagyl 10/21 >  Interim history/subjective:   Patient remains critically ill intubated on mechanical life support.  Sedation cut in half this morning.  She is much more awake able to follow commands on support.  Objective   Blood pressure (!) 108/51, pulse 94, temperature 97.8 F (36.6 C), resp. rate 19, height 5\' 2"  (1.575 m), weight 76.9 kg, SpO2 96 %.    Vent Mode: PSV FiO2 (%):  [50 %] 50 % Set Rate:  [26 bmp] 26 bmp Vt Set:  [300 mL] 300 mL PEEP:  [5 cmH20-8 cmH20] 5 cmH20 Pressure Support:  [10 cmH20] 10 cmH20   Intake/Output Summary (Last 24 hours) at 10/30/2019 0815 Last data filed at 10/30/2019 0330 Gross per 24 hour  Intake 905.12 ml  Output 1000 ml  Net -94.88 ml   Filed Weights   10/27/19 0345 10/29/19 0344 10/30/19 0304  Weight: 72.9 kg 76.6 kg 76.9 kg   Physical Exam: General: Elderly female intubated on mechanical life support HEENT: Endotracheal tube in place Neuro: Sedated on  mechanical support, with sedation lightened she will open eyes and follow commands CV: Regular rate rhythm, S1-S2 PULM: Bilateral mechanically ventilated breath sounds GI: J-tube in place, drainage from around the site Extremities: No significant edema Skin: Open lesion on neck wet-to-dry dressings as below     Resolved Hospital  Problem list   AKI  Assessment & Plan:   Acute hypoxemic and hypercarbic Respiratory Failure  -Reintubated on 10/17. Family agreeable to trach when team is ready. However post-trach care poses a challenge due to the anastomotic leak. Discussed with CTS. For now, will keep patient intubated for now Hx of Asthma  P:  Continue full mechanical vent support, lung protective ventilation Wean PEEP and FiO2 to maintain sats greater than 90% Continue VAP prophylaxis, HOB greater than 30 degrees Chest x-ray as needed PAD sedation guidelines Continue Precedex Continue degrees fentanyl if possible. Continue cefepime plus Flagyl We will discuss potential need for prolonged mechanical support versus retrial at extubation.  Sepsis with shock -Multiple sources including loculated pleural effusion after anastomotic leak from esophagectomy Loculated empyema secondary to anastomotic leak -S/p chest tube for source control -Culture positive for Enterobacter 10/18  Aspiration Pneumonia/Pseudomonas Aeruginosa HCAP Right Pneumothorax  -Resolved with tube thoracostomy P: Continue cefepime, Flagyl, fluconazole Off vasopressors at this time If needed restart Levophed to maintain mean arterial pressure than 65 mmHg.  Sinus Tachycardia Hx of Atrial Fibrillation  P: Continue amiodarone plus Eliquis Once a decision is made on retrial of extubation versus tracheostomy, we will need to hold Eliquis.  Esophageal Squamous Cell Carcinoma s/p Esophagectomy c/b anastomotic leak - Management of incision site per TCTS  P: Supportive care  Early developing Ileus  -ABD xray 10/20 with mild diffuse air distension of the bowel, either reflecting ileus or low-grade obstruction. P: Continue tube feeds  Non-obstructive CAD LAD saccular aneurysm  HTN  P: Holding Plavix and Imdur Continue aspirin Holding home BP meds  Hx of Hypothyroidism  P: Continue levothyroxine  High TG P: Holding  propofol  Chronic normocytic anemia P: Conservative transfusion threshold for hemoglobin less than 7  Best practice:  Diet: Tube Feeds per G Tube  Pain/Anxiety/Delirium protocol (if indicated): wean off VAP protocol (if indicated): Ordered  DVT prophylaxis: full dose lovenox GI prophylaxis: Protonix Glucose control: SSI Mobility: up to chair Code Status: FULL Family Communication: Per primary.  Disposition: ICU  This patient is critically ill with multiple organ system failure; which, requires frequent high complexity decision making, assessment, support, evaluation, and titration of therapies. This was completed through the application of advanced monitoring technologies and extensive interpretation of multiple databases. During this encounter critical care time was devoted to patient care services described in this note for 33 minutes.  Garner Nash, DO  Pulmonary Critical Care 10/30/2019 10:40 AM

## 2019-10-30 NOTE — Progress Notes (Signed)
16 Days Post-Op Procedure(s) (LRB): XI ROBOTIC ASSISTED [place] ESOPHAGECTOMY USING NIMS (N/A) VIDEO BRONCHOSCOPY (N/A) JEJUNOSTOMY PLACEMENT (N/A) INTERCOSTAL NERVE BLOCK (Right) Subjective: Intubated on vent   Objective: Vital signs in last 24 hours: Temp:  [96.4 F (35.8 C)-98.2 F (36.8 C)] 97.7 F (36.5 C) (10/24 1900) Pulse Rate:  [66-102] 76 (10/24 1900) Cardiac Rhythm: Normal sinus rhythm (10/24 1600) Resp:  [10-29] 21 (10/24 1900) BP: (89-148)/(37-66) 125/49 (10/24 1900) SpO2:  [90 %-100 %] 93 % (10/24 1900) FiO2 (%):  [50 %] 50 % (10/24 1600) Weight:  [76.9 kg] 76.9 kg (10/24 0304)  Hemodynamic parameters for last 24 hours:    Intake/Output from previous day: 10/23 0701 - 10/24 0700 In: 905.1 [I.V.:520.8; IV Piggyback:384.3] Out: 1325 [Urine:1000; Drains:225; Stool:60; Chest Tube:40] Intake/Output this shift: No intake/output data recorded.  Neurologic: eyes open, moving all extremities. Heart: regular rate and rhythm Lungs: clear to auscultation bilaterally Abdomen: soft, non-tender; hypoactive BS Extremities: edema mild Wound: neck dressing in place.  Lab Results: Recent Labs    10/29/19 0339 10/30/19 0107  WBC 13.2* 17.5*  HGB 7.9* 8.8*  HCT 27.9* 30.2*  PLT 269 284   BMET:  Recent Labs    10/29/19 0339 10/30/19 0107  NA 152* 156*  K 3.7 4.2  CL 117* 123*  CO2 25 22  GLUCOSE 219* 131*  BUN 39* 40*  CREATININE 0.99 0.95  CALCIUM 7.6* 8.1*    PT/INR: No results for input(s): LABPROT, INR in the last 72 hours. ABG    Component Value Date/Time   PHART 7.372 10/26/2019 0749   HCO3 31.4 (H) 10/26/2019 0749   TCO2 33 (H) 10/26/2019 0749   ACIDBASEDEF 7.8 (H) 10/18/2019 2050   O2SAT 98.0 10/26/2019 0749   CBG (last 3)  Recent Labs    10/30/19 1123 10/30/19 1539 10/30/19 1924  GLUCAP 109* 121* 142*   CLINICAL DATA:  History of esophagectomy  EXAM: PORTABLE CHEST 1 VIEW  COMPARISON:  Six days ago  FINDINGS: Endotracheal  tube with tip 4 mm above the carina. Right port and left PICC in stable position. Pigtail pleural catheter on the right in addition to the large bore chest tube, with reduction and right-sided pleural effusion. Asymmetric interstitial and airspace opacity on the left. No visible pneumothorax. Normal heart size.  IMPRESSION: 1. Lower endotracheal tube, tip 4 mm above the carina. 2. Decreased right loculated pleural effusion after pigtail placement. 3. Unchanged asymmetric left-sided airspace disease, under treatment for pneumonia.   Electronically Signed   By: Monte Fantasia M.D.   On: 10/30/2019 07:15   Narrative & Impression  CLINICAL DATA:  69 year old female with abdominal pain and distension. History of esophageal carcinoma with esophagectomy and RIGHT pleural effusion/pleural catheter placement. History of prior small bowel surgery for bowel obstruction.  EXAM: CT ABDOMEN AND PELVIS WITH CONTRAST  TECHNIQUE: Multidetector CT imaging of the abdomen and pelvis was performed using the standard protocol following bolus administration of intravenous contrast.  CONTRAST:  158mL OMNIPAQUE IOHEXOL 300 MG/ML  SOLN  COMPARISON:  08/16/2019 CT prior studies  FINDINGS: Lower chest: New diffuse airspace disease/consolidation/ground-glass opacities throughout the mid and LOWER LEFT lung noted with small LEFT pleural effusion which appears loculated.  Scattered ground-glass opacities within the mid and LOWER RIGHT lung are present. Pleural catheters are noted within small loculated RIGHT pleural collections.  Esophagectomy and gastric pull-through changes are present.  Hepatobiliary: The liver and gallbladder are unremarkable. No biliary dilatation.  Pancreas: Unremarkable  Spleen: Unremarkable  Adrenals/Urinary  Tract: The kidneys, adrenal glands and bladder are unremarkable.  Stomach/Bowel: Small bowel surgical anastomotic changes are noted within the  LEFT abdomen. There are dilated loops of mid-distal small bowel loops without definite transition point. Contrast is noted within distal small bowel loops and colon.  A LEFT LOWER quadrant percutaneous jejunostomy tube is present without adjacent fluid or abscess.  There is edematous/circumferential wall thickening of a small bowel loop within the UPPER mid pelvis measuring at least 15 cm in length (series 3: Images 66-74) without evidence of pneumatosis. This is nonspecific and may represent focal enteritis but ischemic bowel could have this appearance. There is no evidence of pneumoperitoneum or abscess.  A rectal tube/drain is noted.  Vascular/Lymphatic: Aortic atherosclerotic calcifications are identified. The atherosclerotic calcifications of the visualized mesenteric vessels noted which appear patent. No abnormal lymph nodes are identified.  Reproductive: Status post hysterectomy. No adnexal masses.  Other: A trace amount of ascites is noted adjacent to the liver and spleen. No pneumoperitoneum identified. Subcutaneous edema is present.  Musculoskeletal: No acute or suspicious bony abnormalities are identified.  IMPRESSION: 1. Edematous/circumferential wall thickening of a small bowel loop within the UPPER mid pelvis measuring at least 15 cm in length without evidence of pneumatosis. This is nonspecific and may represent a focal enteritis. Ischemic bowel could have this appearance but there is no evidence of pneumatosis and the visualized mesenteric vessels appear patent. No evidence of pneumoperitoneum or abscess. 2. Dilated loops of mid-distal small bowel loops without definite transition point. Contrast is noted within distal small bowel loops and colon and this may represent a postoperative ileus. 3. Diffuse airspace disease/consolidation/ground-glass opacities throughout the visualized mid and LOWER LEFT lung and scattered ground-glass opacities within  the mid and LOWER RIGHT lung. 4. LEFT LOWER quadrant percutaneous jejunostomy tube without evidence of adjacent fluid or abscess. 5. Pleural catheters within small loculated RIGHT pleural collections. Small LEFT pleural effusion which appears loculated. 6. Trace amount of ascites. 7. Aortic Atherosclerosis (ICD10-I70.0).   Electronically Signed   By: Margarette Canada M.D.   On: 10/30/2019 15:03     Assessment/Plan: S/P Procedure(s) (LRB): XI ROBOTIC ASSISTED [place] ESOPHAGECTOMY USING NIMS (N/A) VIDEO BRONCHOSCOPY (N/A) JEJUNOSTOMY PLACEMENT (N/A) INTERCOSTAL NERVE BLOCK (Right)  Continues to require low dose NE to support BP. Maintaining sinus on IV amio.  VDRF due to aspiration pneumonia: CCM following.   Not tolerating tube feeds. Question if there is partial SBO vs ileus. CT abdomen today does not show any fluid collections or abscess. There is some thickened/edematous small bowel in the upper mid pelvis without pneumatosis which is nonspecific. May need small bowel study via J-tube. TNA started today.   Continue antibiotic for pneumonia.   LOS: 16 days    Gaye Pollack 10/30/2019

## 2019-10-31 DIAGNOSIS — J9601 Acute respiratory failure with hypoxia: Secondary | ICD-10-CM | POA: Diagnosis not present

## 2019-10-31 DIAGNOSIS — C159 Malignant neoplasm of esophagus, unspecified: Secondary | ICD-10-CM | POA: Diagnosis not present

## 2019-10-31 LAB — COMPREHENSIVE METABOLIC PANEL
ALT: 12 U/L (ref 0–44)
AST: 17 U/L (ref 15–41)
Albumin: 1.4 g/dL — ABNORMAL LOW (ref 3.5–5.0)
Alkaline Phosphatase: 54 U/L (ref 38–126)
Anion gap: 7 (ref 5–15)
BUN: 36 mg/dL — ABNORMAL HIGH (ref 8–23)
CO2: 26 mmol/L (ref 22–32)
Calcium: 7.9 mg/dL — ABNORMAL LOW (ref 8.9–10.3)
Chloride: 123 mmol/L — ABNORMAL HIGH (ref 98–111)
Creatinine, Ser: 0.79 mg/dL (ref 0.44–1.00)
GFR, Estimated: >60 mL/min (ref >60)
Glucose, Bld: 161 mg/dL — ABNORMAL HIGH (ref 70–99)
Potassium: 3.5 mmol/L (ref 3.5–5.1)
Sodium: 156 mmol/L — ABNORMAL HIGH (ref 135–145)
Total Bilirubin: 0.6 mg/dL (ref 0.3–1.2)
Total Protein: 5 g/dL — ABNORMAL LOW (ref 6.5–8.1)

## 2019-10-31 LAB — CBC
HCT: 30.3 % — ABNORMAL LOW (ref 36.0–46.0)
Hemoglobin: 8.7 g/dL — ABNORMAL LOW (ref 12.0–15.0)
MCH: 29.3 pg (ref 26.0–34.0)
MCHC: 28.7 g/dL — ABNORMAL LOW (ref 30.0–36.0)
MCV: 102 fL — ABNORMAL HIGH (ref 80.0–100.0)
Platelets: 309 10*3/uL (ref 150–400)
RBC: 2.97 MIL/uL — ABNORMAL LOW (ref 3.87–5.11)
RDW: 15.3 % (ref 11.5–15.5)
WBC: 14 10*3/uL — ABNORMAL HIGH (ref 4.0–10.5)
nRBC: 0.1 % (ref 0.0–0.2)

## 2019-10-31 LAB — DIFFERENTIAL
Abs Immature Granulocytes: 0.11 10*3/uL — ABNORMAL HIGH (ref 0.00–0.07)
Basophils Absolute: 0 10*3/uL (ref 0.0–0.1)
Basophils Relative: 0 %
Eosinophils Absolute: 0 10*3/uL (ref 0.0–0.5)
Eosinophils Relative: 0 %
Immature Granulocytes: 1 %
Lymphocytes Relative: 2 %
Lymphs Abs: 0.3 10*3/uL — ABNORMAL LOW (ref 0.7–4.0)
Monocytes Absolute: 0.6 10*3/uL (ref 0.1–1.0)
Monocytes Relative: 4 %
Neutro Abs: 13 10*3/uL — ABNORMAL HIGH (ref 1.7–7.7)
Neutrophils Relative %: 93 %

## 2019-10-31 LAB — GLUCOSE, CAPILLARY
Glucose-Capillary: 134 mg/dL — ABNORMAL HIGH (ref 70–99)
Glucose-Capillary: 140 mg/dL — ABNORMAL HIGH (ref 70–99)
Glucose-Capillary: 151 mg/dL — ABNORMAL HIGH (ref 70–99)
Glucose-Capillary: 151 mg/dL — ABNORMAL HIGH (ref 70–99)
Glucose-Capillary: 155 mg/dL — ABNORMAL HIGH (ref 70–99)
Glucose-Capillary: 165 mg/dL — ABNORMAL HIGH (ref 70–99)
Glucose-Capillary: 190 mg/dL — ABNORMAL HIGH (ref 70–99)

## 2019-10-31 LAB — MAGNESIUM: Magnesium: 2.4 mg/dL (ref 1.7–2.4)

## 2019-10-31 LAB — PHOSPHORUS: Phosphorus: 2.8 mg/dL (ref 2.5–4.6)

## 2019-10-31 LAB — TRIGLYCERIDES: Triglycerides: 168 mg/dL — ABNORMAL HIGH (ref <150)

## 2019-10-31 MED ORDER — ENOXAPARIN SODIUM 80 MG/0.8ML ~~LOC~~ SOLN
80.0000 mg | Freq: Two times a day (BID) | SUBCUTANEOUS | Status: DC
  Administered 2019-10-31 – 2019-11-03 (×7): 80 mg via SUBCUTANEOUS
  Filled 2019-10-31 (×8): qty 0.8

## 2019-10-31 MED ORDER — DEXTROSE 5 % IV SOLN: INTRAVENOUS | Status: DC

## 2019-10-31 MED ORDER — SODIUM CHLORIDE 0.9 % IV SOLN
2.0000 g | Freq: Three times a day (TID) | INTRAVENOUS | Status: AC
  Administered 2019-10-31 – 2019-11-03 (×11): 2 g via INTRAVENOUS
  Filled 2019-10-31 (×11): qty 2

## 2019-10-31 MED ORDER — TRAVASOL 10 % IV SOLN
INTRAVENOUS | Status: AC
  Filled 2019-10-31: qty 907.2

## 2019-10-31 MED ORDER — POTASSIUM CHLORIDE 10 MEQ/50ML IV SOLN
10.0000 meq | INTRAVENOUS | Status: AC
  Administered 2019-10-31 (×3): 10 meq via INTRAVENOUS
  Filled 2019-10-31 (×3): qty 50

## 2019-10-31 NOTE — Progress Notes (Signed)
[address]       [place] [address]             [phone]                 17 Days Post-Op Procedure(s) (LRB): XI ROBOTIC ASSISTED [place] ESOPHAGECTOMY USING NIMS (N/A) VIDEO BRONCHOSCOPY (N/A) JEJUNOSTOMY PLACEMENT (N/A) INTERCOSTAL NERVE BLOCK (Right)   Events: Remains intubated J-tube leakage _______________________________________________________________ Vitals: BP (!) 130/44   Pulse 84   Temp 99.5 F (37.5 C) (Oral)   Resp 16   Ht 5\' 2"  (1.575 m)   Wt 76.9 kg   SpO2 93%   BMI 31.01 kg/m   - Neuro: sedated  - Cardiovascular: sinus  Drips: amio.      - Pulm:  Vent Mode: PSV FiO2 (%):  [40 %-50 %] 50 % Set Rate:  [26 bmp] 26 bmp Vt Set:  [300 mL] 300 mL PEEP:  [5 cmH20-8 cmH20] 5 cmH20 Pressure Support:  [8 cmH20-10 cmH20] 8 cmH20  ABG    Component Value Date/Time   PHART 7.372 10/26/2019 0749   PCO2ART 54.4 (H) 10/26/2019 0749   PO2ART 105 10/26/2019 0749   HCO3 31.4 (H) 10/26/2019 0749   TCO2 33 (H) 10/26/2019 0749   ACIDBASEDEF 7.8 (H) 10/15/2019 2050   O2SAT 98.0 10/26/2019 0749    - Abd: soft.  J tube in place purulent drainage from around the J-tube site - Extremity: warm  .Intake/Output      10/24 0701 - 10/25 0700 10/25 0701 - 10/26 0700   I.V. (mL/kg) 2031.3 (26.4) 450.6 (5.9)   Other 60 60   IV Piggyback 1257.7 98.4   Chest Tube     Total Intake(mL/kg) 3348.9 (43.5) 609.1 (7.9)   Urine (mL/kg/hr) 600 (0.3) 400 (0.8)   Drains 100 0   Stool 420    Chest Tube 36 10   Total Output 1156 410   Net +2192.9 +199.1           _______________________________________________________________ Labs: CBC Latest Ref Rng & Units 10/31/2019 10/30/2019 10/29/2019  WBC 4.0 - 10.5 K/uL 14.0(H) 17.5(H) 13.2(H)  Hemoglobin 12.0 - 15.0 g/dL 8.7(L) 8.8(L) 7.9(L)  Hematocrit 36 - 46 % 30.3(L) 30.2(L) 27.9(L)  Platelets 150 - 400 K/uL 309 284 269   CMP Latest Ref Rng & Units 10/31/2019 10/30/2019 10/29/2019  Glucose 70 -  99 mg/dL 161(H) 131(H) 219(H)  BUN 8 - 23 mg/dL 36(H) 40(H) 39(H)  Creatinine 0.44 - 1.00 mg/dL 0.79 0.95 0.99  Sodium 135 - 145 mmol/L 156(H) 156(H) 152(H)  Potassium 3.5 - 5.1 mmol/L 3.5 4.2 3.7  Chloride 98 - 111 mmol/L 123(H) 123(H) 117(H)  CO2 22 - 32 mmol/L 26 22 25   Calcium 8.9 - 10.3 mg/dL 7.9(L) 8.1(L) 7.6(L)  Total Protein 6.5 - 8.1 g/dL 5.0(L) 5.0(L) -  Total Bilirubin 0.3 - 1.2 mg/dL 0.6 0.5 -  Alkaline Phos 38 - 126 U/L 54 49 -  AST 15 - 41 U/L 17 23 -  ALT 0 - 44 U/L 12 13 -      _______________________________________________________________  Assessment and Plan: POD 17 s/p Robotic 3 field esophagectomy.  Respiratory failure, cervical anastamotic leak.    CT scan reviewed.  Area of small bowel the was evidence concerning for enteritis.  She is currently on biotics and this will continue for a full 2-week course. In regards to her respiratory failure she is sitting in interstitial changes in the left lung.  She  will require ongoing ventilatory support.  Neuro: pain controlled CV: sinus, continue amio gtt for now.  On neo at 4 for sepsis Pulm: discussed case with ICU team, trach and incision care will be challenging.  Will continue vent management for now.  No rush for trach.  Minimal CT output from either drain.  Contrast is well drained on cross-sectional imaging.  Will keep both until pt is extubated. Renal: stable GI: J tube feeds on hold for now.  Currently receiving TPN.  Prior to starting tube feeds are likely will perform an EGD and place a nasoduodenal tube. Heme: stable ID: wet to dry dressing to cervical incision.  Continue abx of asp pneumonia Endo: SSI  Dispo: continue ICU care  Melodie Bouillon, MD 10/31/2019 1:15 PM

## 2019-10-31 NOTE — Progress Notes (Signed)
PHARMACY - TOTAL PARENTERAL NUTRITION CONSULT NOTE  Indication: Intolerance to EN / Prolonged ileus  Patient Measurements: Height: 5\' 2"  (157.5 cm) Weight: 76.9 kg (169 lb 8.5 oz) IBW/kg (Calculated) : 50.1 TPN AdjBW (KG): 56.8 Body mass index is 31.01 kg/m.  Weight = 73 kg on admit  Assessment:  51 YOF presented on 11/06/2019 for bronch, esophagectomy and J-tube placement due to esophageal cancer.  Patient was started on EN post-op on 10/15/19 and tolerated goal rate.  She also passed her swallow evaluation and started on a CLD with nocturnal TF.  She unfortunately developed right PTX and subsequently require intubation, transfer to the ICU, and chest tube placement.  TF has been interrupted frequently since 10/24/19 due to drainage around J-tube and suspected ileus vs pSBO.  Pharmacy consulted for TPN management.  Glucose / Insulin: no hx DM, A1c 5.8% - CBGs well controlled.   Required 10 units SSI 10/24 + 10 units in TPN (was on Levemir 10/d while on TF with controlled CBGs)  Electrolytes: Na/CL 156/123, K 3.5 (88mEq x 3 doses ordered), others WNL Renal: SCr 0.79, BUN 36 LFTs / TGs: LFTs / tbili WNL, TG 168 Prealbumin / albumin: prealbumin < 5, albumin down 1.4 Intake / Output; MIVF: UOP 0.3 ml/kg/hr, drain O/P 159mL, stool 456mL, CT O/P 25mL, net +8L (weight 73 > 76.9 kg, pitting edema on hands only) GI Imaging: none since TPN Surgeries / Procedures: none since TPN  Central access: PICC placed 10/21/19 TPN start date: 10/30/19  Nutritional Goals (per RD rec on 10/21): 1558 kCal, 100-120gm protein, >/= 1.5L fluid per day  Current Nutrition:  TPN  Plan:  Increase TPN to 60 ml/hr at 1800 (goal rate 75 ml/hr) TPN will provide 91g AA and 1243 kCal, meeting ~80% of needs Electrolytes in TPN:no Na, K 4mEq/L, Ca 36mEq/L, Mag 58mEq/L, Phos 15 mmol/L, max acetate (electrolytes will increase with increasing rate) Add standard MVI and trace elements to TPN Continue CVTS SSI Q4H + increase  to 15 units regular insulin in TPN F/U AM labs, volume status  Salome Arnt, PharmD, BCPS Clinical Pharmacist Please see AMION for all pharmacy numbers 10/31/2019 9:30 AM

## 2019-10-31 NOTE — Progress Notes (Signed)
ANTICOAGULATION CONSULT NOTE - Initial Consult  Pharmacy Consult for Eliquis > Lovenox Indication: atrial fibrillation  Allergies  Allergen Reactions  . Honey Bee Venom Protein [Bee Venom] Anaphylaxis  . Ether Nausea And Vomiting  . Nickel Other (See Comments)    infection  . Other Other (See Comments)    Pt reports that she cannot have staples placed due to infection.  . Tape Rash    Patient states that she has an allergic reaction to certain tapes. She states that she can tolerate paper tape.    Patient Measurements: Height: 5\' 2"  (157.5 cm) Weight: 76.9 kg (169 lb 8.5 oz) IBW/kg (Calculated) : 50.1  Vital Signs: Temp: 99 F (37.2 C) (10/25 0733) Temp Source: Oral (10/25 0733) BP: 139/47 (10/25 0800) Pulse Rate: 85 (10/25 0800)  Labs: Recent Labs    10/29/19 0339 10/29/19 0339 10/30/19 0107 10/31/19 0400  HGB 7.9*   < > 8.8* 8.7*  HCT 27.9*  --  30.2* 30.3*  PLT 269  --  284 309  CREATININE 0.99  --  0.95 0.79   < > = values in this interval not displayed.    Estimated Creatinine Clearance: 62.8 mL/min (by C-G formula based on SCr of 0.79 mg/dL).   Medical History: Past Medical History:  Diagnosis Date  . Asthma   . Cancer ([place])    esophageal cancer  . COPD (chronic obstructive pulmonary disease) ([place])   . Coronary artery disease   . Depression   . Diabetes mellitus without complication ([place])    Type II  . Diastolic dysfunction   . Diverticulosis   . Fatty liver   . GERD without esophagitis   . Hx of Clostridium difficile infection   . Hx of colonic polyps   . Hx of small bowel obstruction   . Hypertension, benign   . Hypothyroidism   . Irritable bowel syndrome   . Left bundle branch block   . Lumbar herniated disc   . Mixed hyperlipidemia   . Osteoarthritis   . PAF (paroxysmal atrial fibrillation) ([place])    06/2019  . Pneumonia   . Sleep apnea    Uses O2 at bedtime on occasion    Assessment: 69 yo F s/p esophagectomy with history of  atrial fibrillation. Was on Xarelto PTA. Patient was receiving Eliquis since Xarelto cannot go down J tube (last Eliquis dose 10/24 ~2200), but J tube is no longer working. Pharmacy asked to switch to therapeutic enoxaparin. Hgb low stable 7-8s, plts wnl. No overt bleeding noted.  Goal of Therapy:  Monitor platelets by anticoagulation protocol: Yes   Plan:   Stop Eliquis Start Enoxaparin 1 mg/kg (80 mg) BID Monitor CBC, bleeding, plans for possible trach, ability to switch back to PO [facility]  Richardine Service, PharmD PGY2 Cardiology Pharmacy Resident Phone: [phone] 10/31/2019  8:49 AM  Please check [email or web address] for unit-specific pharmacy phone numbers.

## 2019-10-31 NOTE — Progress Notes (Signed)
Nutrition Follow-up  DOCUMENTATION CODES:   Non-severe (moderate) malnutrition in context of chronic illness  INTERVENTION:   - TPN per Pharmacy  - RD will monitor for ability to restart TF  NUTRITION DIAGNOSIS:   Moderate Malnutrition related to chronic illness (COPD, esophageal cancer s/p chemoradiation) as evidenced by moderate fat depletion, severe muscle depletion.  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 Presents this admission for surgical management of esophageal cancer.  10/09 - s/p esophagectomy, J-tube 10/12 - esophagram with no evidence of anastomotic leak, NGT removed, clear liquids 10/13 - tube feeds transitioned to nocturnal 10/14 - rapid response, CXR showing new right basilar pneumothorax and worsening airspace disease in LUL, chest tube placed back to suction 10/15 - intubated 10/17 - extubated 10/18 - reintubated, right-sided chest tube placed 10/19 - TF held due to drainage around J-tube site 10/23 - TF held again 10/24 - TPN initiated  Discussed pt with RN and during ICU rounds.  Tube feeds have been held since 10/23 due to drainage around J-tube site and suspected ileus vs partial SBO. Pharmacy has been consulted for TPN management and TPN started on 10/24.  At 1800 today, TPN to increase to 60 ml/hr to provide 1243 kcal and 91 grams of protein. TPN currently infusing at 40 ml/hr.  Per CCM note, plan is to obtain enteral access via EGD and tube placement by CVTS. Nothing through J-tube at this time.  Admit weight: 73 kg Current weight: 76.9 kg EDW: ~72 kg  Pt with +1 pitting  generalized edema, non-pitting edema to BUE, and +1 pitting edema to BLE.  Patient is currently intubated on ventilator support MV: 9.6 L/min Temp (24hrs), Avg:97.8 F (36.6 C), Min:96.4 F (35.8 C), Max:99 F (37.2 C) BP (cuff): 139/52 MAP (cuff): 76  Drips: TPN Precedex Levophed Amiodarone  Medications reviewed and include: colace, SSI q 4 hours, IV Reglan 10 mg q 6 hours, IV protonix, miralax, senna, IV abx, IV diflucan  Labs reviewed: sodium 156, hemoglobin 8.7 CBG's: 109-155 x 24 hours  UOP: 600 ml x 24 hours J-tube output: 100 ml x 24 hours Rectal tube: 420 ml x 24 hours CT: 36 ml x 24 hours I/O's: +19.0 L since admit  Diet Order:   Diet Order    None      EDUCATION NEEDS:   Education needs have been addressed  Skin:  Skin Assessment: Skin Integrity Issues: Stage II: bilateral buttocks Incisions: right chest, abdomen x 2, neck  Last BM:  10/31/19 rectal tube  Height:   Ht Readings from Last 1 Encounters:  10/20/19 5\' 2"  (1.575 m)    Weight:   Wt Readings from Last 1 Encounters:  10/31/19 76.9 kg    BMI:  Body mass index is 31.01 kg/m.  Estimated Nutritional Needs:   Kcal:  1600-1800  Protein:  100-120 gram  Fluid:  >/= 1.5 L    Gaynell Face, MS, RD, LDN Inpatient Clinical Dietitian Please see AMiON for contact information.

## 2019-10-31 NOTE — Progress Notes (Signed)
NAME:  [name], MRN:  [id number], DOB:  1950-11-01, LOS: 59 ADMISSION DATE:  10/30/2019, CONSULTATION DATE: 10/20/2019 REFERRING MD:  Dr. Kipp Brood, CHIEF COMPLAINT:  SOB   Brief History   [name] is a 69 y/o female with a PMHx of esophageal squamous cell carcinoma admitted on 10/08 for esophagectomy with interval development of respiratory distress requiring BiPAP and transfer to ICU.   History of present illness   [name] presented to [facility] for admission for elective esophagectomy on 10/08 that proceeded without any complications. She is currently POD 6.   Overnight, patient developed worsening work of breathing requiring NRB. She was noted to be diaphoretic with decreased lung sounds by the RN. Rapid response was called, who switched chest tube from water seal to suction. Air leak noted in [place]. CXR obtained overnight showed small right basilar pneumothorax with loculated pleural effusion, as well as interval development of extensive airspace infiltrate in the LUL. Repeat CXR this morning shows improvement in pneumothorax with no change in opacities. In the late morning, patient developed worsening respiratory distress requiring BiPAP placement and transfer to ICU for close monitoring.  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 Fib,   Significant Hospital Events   10/08 >> Admitted to [facility] 10/14 >> Transferred to ICU for respiratory distress   Consults:  PCCM  Procedures:  10/08 >> Esophagectomy 10/15 >> ETT placed, Bronchoscopy     Significant Diagnostic Tests:  CXR 10/13 >> Small right basilar pneumothorax, however, has increased in size since prior examination and there has now developed a small amount of laterally loculated pleural fluid. Extensive airspace infiltrate has now developed throughout the left upper lobe, likely infectious in the appropriate clinical setting.   CXR 10/14  >> Right basilar  pneumothorax is smaller compared to earlier in the day. No tension component. Subcutaneous air present on the right. There is extensive airspace opacity throughout the left upper lobe and left perihilar regions, similar to earlier in the day. There is patchy atelectasis in the right mid lung and right base with suspected loculated pleural effusion on the right laterally  ABD xray 10/20 > Mild diffuse air distension of the bowel, either reflecting ileus or low-grade obstruction.  Micro Data:  10/18 Pleural culture - moderate GNR 10/15 Respiratory culture - Pan-senstive PA Wound culture 10/18 >   Antimicrobials:  Zosyn 10/14 >> 10/21 Cefepime 10/21 > Flagyl 10/21 >  Interim history/subjective:   Patient remains critically ill intubated on mechanical life support. No major issues overnight.   Objective   Blood pressure (!) 149/51, pulse 87, temperature 99 F (37.2 C), temperature source Oral, resp. rate (!) 23, height 5\' 2"  (1.575 m), weight 76.9 kg, SpO2 94 %.    Vent Mode: PSV FiO2 (%):  [40 %-50 %] 40 % Set Rate:  [26 bmp] 26 bmp Vt Set:  [300 mL] 300 mL PEEP:  [5 cmH20-8 cmH20] 5 cmH20 Pressure Support:  [8 cmH20-10 cmH20] 8 cmH20   Intake/Output Summary (Last 24 hours) at 10/31/2019 1008 Last data filed at 10/31/2019 0800 Gross per 24 hour  Intake 2254.93 ml  Output 694 ml  Net 1560.93 ml   Filed Weights   10/29/19 0344 10/30/19 0304 10/31/19 0100  Weight: 76.6 kg 76.9 kg 76.9 kg   Physical Exam: General: Elderly female intubated on mechanical life support HEENT: Endotracheal tube in place Neuro: Sedated on mechanical support will open eyes to voice follows  commands CV: Regular rate rhythm, S1-S2 PULM: Bilateral mechanically ventilated breath sounds GI: J-tube in place, drainage around the site still present, discussed with nursing staff at bedside Extremities: No significant edema Skin: Open wound on left neck wet-to-dry dressings, management per  surgery.     Resolved Hospital Problem list   AKI  Assessment & Plan:   Acute hypoxemic and hypercarbic Respiratory Failure  -Reintubated on 10/17. Family agreeable to trach when team is ready. However post-trach care poses a challenge due to the anastomotic leak. Discussed with CTS. For now, will keep patient intubated for now Hx of Asthma  P:  Continue full mechanical vent support, lung protective strategy ventilation Wean PEEP and FiO2 as tolerated to maintain SPO2 greater than 90%. Continue VAP prophylaxis, head of bed elevated greater than 30 degrees Chest x-ray as needed PAD guidelines sedation. Continue Precedex As needed fentanyl Antibiotics deescalated to complete 14-day course, as Flagyl stop Continue fluconazole stop on Thursday of this week.  Sepsis with shock -Multiple sources including loculated pleural effusion after anastomotic leak from esophagectomy Loculated empyema secondary to anastomotic leak -S/p chest tube for source control -Culture positive for Enterobacter 10/18  Aspiration Pneumonia/Pseudomonas Aeruginosa HCAP Right Pneumothorax  -Resolved with tube thoracostomy P: Complete 14-day course of antimicrobials, cefepime continued Fluconazole through this Thursday for 7-day course.  Sinus Tachycardia Hx of Atrial Fibrillation  P: Due to loss of enteral access switch Eliquis to Lovenox.  Esophageal Squamous Cell Carcinoma s/p Esophagectomy c/b anastomotic leak - Management of incision site per TCTS  P: Supportive care Need for enteral access plan for EGD and tube placement by thoracic surgery.  Early developing Ileus  -ABD xray 10/20 with mild diffuse air distension of the bowel, either reflecting ileus or low-grade obstruction. P: Nothing through J-tube at this time.  Non-obstructive CAD LAD saccular aneurysm  HTN  P: Holding Plavix and Imdur Continue aspirin Holding home BP meds  Hx of Hypothyroidism  P: Continue  levothyroxine  High TG P: Off propofol  Chronic normocytic anemia P: Conservative transfusion threshold for hemoglobin less than 7.  Best practice:  Diet: Tube Feeds per G Tube  Pain/Anxiety/Delirium protocol (if indicated): wean off VAP protocol (if indicated): Ordered  DVT prophylaxis: full dose lovenox GI prophylaxis: Protonix Glucose control: SSI Mobility: up to chair Code Status: FULL Family Communication: Per primary.  Disposition: ICU  This patient is critically ill with multiple organ system failure; which, requires frequent high complexity decision making, assessment, support, evaluation, and titration of therapies. This was completed through the application of advanced monitoring technologies and extensive interpretation of multiple databases. During this encounter critical care time was devoted to patient care services described in this note for 32 minutes.  Garner Nash, DO [facility] 10/31/2019 10:11 AM

## 2019-10-31 NOTE — Progress Notes (Signed)
PT Cancellation Note  Patient Details Name: [name] MRN: [id number] DOB: Jan 29, 1950   Cancelled Treatment:    Reason Eval/Treat Not Completed: Medical issues which prohibited therapy (Pt with incr sedation. Nurse asked PT to [place] 10/31/2019, 9:33 AM [name] W,PT Acute Rehabilitation Services Pager:  [phone]  Office:  [phone]

## 2019-10-31 NOTE — Progress Notes (Signed)
Pharmacy Antibiotic Note  [name] is a 69 y.o. female admitted on 11/03/2019 with sepsis.  Rising temp and growing Enterobacter cloacae + aerogenes  Pharmacy has been consulted for cefepime, fluconazole, and metronidazole dosing.  Patient afebrile today. WBC trending down to 14 today. Renal function improved, Scr 0.79, CrCl 62. Will adjust doses accordingly.  Per discussion with CCM and Dr. Kipp Brood, will stop metronidazole, continue fluconazole for 1 week, and continue cefepime through 10/28 for 2-week coverage of zosyn/cefepime.  Plan: Continue Fluconazole 400 mg IV q24 hrs through 10/27 Adjust Cefepime to 2g IV q8 hrs through 10/28 Stop Metronidazole Monitor renal function, clinical progression  Height: _0  (157.5 cm) Weight: 76.9 kg (169 lb 8.5 oz) IBW/kg (Calculated) : 50.1  Temp (24hrs), Avg:97.6 F (36.4 C), Min:96.4 F (35.8 C), Max:98.6 F (37 C)  Recent Labs  Lab 10/27/19 0239 10/27/19 1600 10/27/19 1900 10/28/19 0357 10/29/19 0339 10/30/19 0107 10/31/19 0400  WBC 18.0*  --   --  15.6* 13.2* 17.5* 14.0*  CREATININE 1.13*  --   --  0.91 0.99 0.95 0.79  LATICACIDVEN  --  1.5 1.3  --   --   --   --     Estimated Creatinine Clearance: 62.8 mL/min (by C-G formula based on SCr of 0.79 mg/dL).    Allergies  Allergen Reactions  . Honey Bee Venom Protein [Bee Venom] Anaphylaxis  . Ether Nausea And Vomiting  . Nickel Other (See Comments)    infection  . Other Other (See Comments)    Pt reports that she cannot have staples placed due to infection.  . Tape Rash    Patient states that she has an allergic reaction to certain tapes. She states that she can tolerate paper tape.    Antimicrobials this admission: Zosyn 10/14>> 10/21 Cefepime 10/21>>(10/28) Flagyl 10/21>>10/25 Fluconazole 10/21>>(10/27)  Microbiology results: BCx 10/21: ngtd Pleural fluid 10/18: enterobacter cloacae + enterobacter aerogenes (S: cefepime) BAL cx 10/15: pan-sens  pseudomonas Body fluid from JP drain 10/15: cancelled (micro never received)  Richardine Service, PharmD PGY2 Cardiology Pharmacy Resident Phone: [phone] 10/31/2019  7:01 AM  Please check [email or web address] for unit-specific pharmacy phone numbers.

## 2019-10-31 NOTE — Progress Notes (Signed)
Patient ID: [name], female   DOB: Nov 22, 1950, 69 y.o.   MRN: [id number] EVENING ROUNDS NOTE :     [place]       [place] [id number]             [phone]                 17 Days Post-Op Procedure(s) (LRB): XI ROBOTIC ASSISTED [place] ESOPHAGECTOMY USING NIMS (N/A) VIDEO BRONCHOSCOPY (N/A) JEJUNOSTOMY PLACEMENT (N/A) INTERCOSTAL NERVE BLOCK (Right)  Total Length of Stay:  LOS: 17 days  BP (!) 136/48   Pulse 72   Temp 98.6 F (37 C) (Oral)   Resp 20   Ht 5\' 2"  (1.575 m)   Wt 76.9 kg   SpO2 91%   BMI 31.01 kg/m   .Intake/Output      10/24 0701 - 10/25 0700 10/25 0701 - 10/26 0700   I.V. (mL/kg) 2031.3 (26.4) 450.6 (5.9)   Other 60 60   IV Piggyback 1257.7 98.4   Chest Tube     Total Intake(mL/kg) 3348.9 (43.5) 609.1 (7.9)   Urine (mL/kg/hr) 600 (0.3) 400 (0.6)   Drains 100 0   Stool 420    Chest Tube 36 10   Total Output 1156 410   Net +2192.9 +199.1          . sodium chloride Stopped (10/23/19 1145)  . amiodarone 30 mg/hr (10/31/19 1300)  . ceFEPime (MAXIPIME) IV 2 g (10/31/19 1502)  . dexmedetomidine (PRECEDEX) IV infusion 0.3 mcg/kg/hr (10/31/19 1300)  . dextrose 50 mL/hr at 10/31/19 1300  . fentaNYL infusion INTRAVENOUS Stopped (10/30/19 1742)  . fluconazole (DIFLUCAN) IV Stopped (10/30/19 2159)  . norepinephrine (LEVOPHED) Adult infusion 4 mcg/min (10/31/19 1300)  . TPN ADULT (ION) 40 mL/hr at 10/31/19 1300  . TPN ADULT (ION)       Lab Results  Component Value Date   WBC 14.0 (H) 10/31/2019   HGB 8.7 (L) 10/31/2019   HCT 30.3 (L) 10/31/2019   PLT 309 10/31/2019   GLUCOSE 161 (H) 10/31/2019   CHOL  01/20/2007    96        ATP III CLASSIFICATION:  <200     mg/dL   Desirable  200-239  mg/dL   Borderline High  >=240    mg/dL   High          TRIG 168 (H) 10/31/2019   HDL 28 (L) 01/20/2007   LDLCALC  01/20/2007    36        Total Cholesterol/HDL:CHD Risk Coronary Heart Disease Risk Table                     Men    Women  1/2 Average Risk   3.4   3.3  Average Risk       5.0   4.4  2 X Average Risk   9.6   7.1  3 X Average Risk  23.4   11.0        Use the calculated Patient Ratio above and the CHD Risk Table to determine the patient's CHD Risk.        ATP III CLASSIFICATION (LDL):  <100     mg/dL   Optimal  100-129  mg/dL   Near or Above                    Optimal  130-159  mg/dL   Borderline  160-189  mg/dL  High  >190     mg/dL   Very High   ALT 12 10/31/2019   AST 17 10/31/2019   NA 156 (H) 10/31/2019   K 3.5 10/31/2019   CL 123 (H) 10/31/2019   CREATININE 0.79 10/31/2019   BUN 36 (H) 10/31/2019   CO2 26 10/31/2019   TSH 2.153 06/12/2019   INR 1.3 (H) 10/11/2019   HGBA1C 5.8 (H) 10/11/2019   Remains sedated on vent j tube feeding on hold for now    [name] MD  Beeper [phone] Office [phone] 10/31/2019 3:55 PM

## 2019-11-01 DIAGNOSIS — C159 Malignant neoplasm of esophagus, unspecified: Secondary | ICD-10-CM | POA: Diagnosis not present

## 2019-11-01 DIAGNOSIS — J9601 Acute respiratory failure with hypoxia: Secondary | ICD-10-CM | POA: Diagnosis not present

## 2019-11-01 LAB — BASIC METABOLIC PANEL
Anion gap: 5 (ref 5–15)
BUN: 38 mg/dL — ABNORMAL HIGH (ref 8–23)
CO2: 29 mmol/L (ref 22–32)
Calcium: 8 mg/dL — ABNORMAL LOW (ref 8.9–10.3)
Chloride: 120 mmol/L — ABNORMAL HIGH (ref 98–111)
Creatinine, Ser: 0.82 mg/dL (ref 0.44–1.00)
GFR, Estimated: >60 mL/min (ref >60)
Glucose, Bld: 247 mg/dL — ABNORMAL HIGH (ref 70–99)
Potassium: 3.8 mmol/L (ref 3.5–5.1)
Sodium: 154 mmol/L — ABNORMAL HIGH (ref 135–145)

## 2019-11-01 LAB — GLUCOSE, CAPILLARY
Glucose-Capillary: 166 mg/dL — ABNORMAL HIGH (ref 70–99)
Glucose-Capillary: 146 mg/dL — ABNORMAL HIGH (ref 70–99)
Glucose-Capillary: 160 mg/dL — ABNORMAL HIGH (ref 70–99)
Glucose-Capillary: 172 mg/dL — ABNORMAL HIGH (ref 70–99)
Glucose-Capillary: 177 mg/dL — ABNORMAL HIGH (ref 70–99)

## 2019-11-01 LAB — MAGNESIUM: Magnesium: 2.3 mg/dL (ref 1.7–2.4)

## 2019-11-01 LAB — CULTURE, BLOOD (ROUTINE X 2)
Culture: NO GROWTH
Culture: NO GROWTH

## 2019-11-01 LAB — PHOSPHORUS: Phosphorus: 3.2 mg/dL (ref 2.5–4.6)

## 2019-11-01 MED ORDER — FUROSEMIDE 10 MG/ML IJ SOLN
40.0000 mg | Freq: Three times a day (TID) | INTRAMUSCULAR | Status: AC
  Administered 2019-11-01 (×3): 40 mg via INTRAVENOUS
  Filled 2019-11-01 (×3): qty 4

## 2019-11-01 MED ORDER — POTASSIUM CHLORIDE 10 MEQ/50ML IV SOLN
10.0000 meq | INTRAVENOUS | Status: AC
  Administered 2019-11-01 (×3): 10 meq via INTRAVENOUS
  Filled 2019-11-01 (×3): qty 50

## 2019-11-01 MED ORDER — TRAVASOL 10 % IV SOLN
INTRAVENOUS | Status: AC
  Filled 2019-11-01: qty 1134

## 2019-11-01 MED ORDER — METOLAZONE 5 MG PO TABS
5.0000 mg | ORAL_TABLET | Freq: Once | ORAL | Status: DC
  Filled 2019-11-01: qty 1

## 2019-11-01 NOTE — Plan of Care (Signed)

## 2019-11-01 NOTE — Progress Notes (Signed)
WASTE  Wasted 60 ml of Fentanyl with Heather Jobe into the steri cycle.

## 2019-11-01 NOTE — Progress Notes (Signed)
Referring Physician(s): Dr Valeta Harms Dr [place]/Dr Servando Snare  Supervising Physician: Aletta Edouard  Patient Status:  [facility] - In-pt  Chief Complaint:  Rt pleural effusion Anterior Chest tube placement in IR 10/24/19  Subjective:  Intubated Little response Rt pleural effusion-- requiring anterior Rt chest tube placement in IR 10/18  No changes  Allergies: Honey bee venom protein [bee venom], Ether, Nickel, Other, and Tape  Medications: Prior to Admission medications   Medication Sig Start Date End Date Taking? Authorizing Provider  Amino Acids-Protein Hydrolys (FEEDING SUPPLEMENT, PRO-STAT SUGAR FREE 64,) LIQD Place 30 mLs into feeding tube 2 (two) times daily. Patient taking differently: Place 30 mLs into feeding tube in the morning, at noon, in the evening, and at bedtime.  06/14/19  Yes Lavina Hamman, MD  aspirin EC 81 MG tablet Take 1 tablet (81 mg total) by mouth daily. 12/22/18  Yes Tobb, Kardie, DO  BREO ELLIPTA 100-25 MCG/INH AEPB Inhale 1 puff into the lungs at bedtime.  08/18/18  Yes [provider]  Calcium Carbonate-Vitamin D (CALCIUM 600/VITAMIN D PO) Take 1 tablet by mouth daily.   Yes [provider]  EPINEPHrine 0.3 mg/0.3 mL IJ SOAJ injection Inject 0.3 mg into the muscle as needed for anaphylaxis. 11/14/18  Yes [provider]  FLUoxetine (PROZAC) 20 MG capsule Take 20 mg by mouth daily. Pt currently not taking, as she feels she doesn't needs them at this time.   Yes [provider]  gabapentin (NEURONTIN) 300 MG capsule Take 300 mg by mouth 2 (two) times daily.  08/19/18  Yes [provider]  isosorbide mononitrate (IMDUR) 30 MG 24 hr tablet TAKE 1 TABLET BY MOUTH  DAILY Patient taking differently: Take 30 mg by mouth daily.  08/26/19  Yes Tobb, Kardie, DO  levothyroxine (SYNTHROID) 75 MCG tablet Take 75 mcg by mouth daily before breakfast.  08/23/18  Yes [provider]  metoprolol tartrate (LOPRESSOR) 25 MG  tablet Take 0.5 tablets (12.5 mg total) by mouth 2 (two) times daily. 07/12/19 11/03/2019 Yes Tobb, Kardie, DO  mometasone (NASONEX) 50 MCG/ACT nasal spray Place 2 sprays into the nose daily as needed (pain).    Yes [provider]  nitroGLYCERIN (NITROSTAT) 0.4 MG SL tablet Place 1 tablet (0.4 mg total) under the tongue every 5 (five) minutes as needed for chest pain. 07/12/19 10/10/19 Yes Tobb, Kardie, DO  oxyCODONE (OXY IR/ROXICODONE) 5 MG immediate release tablet Take 10 mg by mouth every 4 (four) hours as needed for moderate pain.  09/27/19  Yes [provider]  pantoprazole (PROTONIX) 40 MG tablet Take 40 mg by mouth daily.  10/14/18  Yes [provider]  potassium chloride 20 MEQ/15ML (10%) SOLN Take 20 mEq by mouth daily. 06/22/19  Yes [provider]  promethazine (PHENERGAN) 25 MG tablet Take 25 mg by mouth every 8 (eight) hours as needed for nausea or vomiting.  06/28/19  Yes [provider]  rosuvastatin (CRESTOR) 40 MG tablet TAKE 1 TABLET BY MOUTH  DAILY Patient taking differently: Take 40 mg by mouth daily.  08/26/19  Yes Tobb, Kardie, DO  Water For Irrigation, Sterile (FREE WATER) SOLN Place 200 mLs into feeding tube every 8 (eight) hours. 06/14/19  Yes Lavina Hamman, MD  Ascorbic Acid (VITAMIN C) 100 MG tablet Take 100 mg by mouth daily. Patient not taking: Reported on 10/03/2019    [provider]  oxyCODONE-acetaminophen (PERCOCET) 10-325 MG tablet Take 1 tablet by mouth every 4 (four) hours  as needed for pain. Patient not taking: Reported on 10/03/2019    [provider]  rifaximin (XIFAXAN) 550 MG TABS tablet Take 550 mg by mouth See admin instructions. Take 1 tablet (550 mg) by mouth twice daily for 2 weeks, hold for 4 months then resume cycle Patient not taking: Reported on 10/03/2019    [provider]  sucralfate (CARAFATE) 1 GM/10ML suspension Take 10 mLs (1 g total) by mouth 4 (four) times daily -  with meals and at  bedtime. Patient not taking: Reported on 10/03/2019 06/14/19   Lavina Hamman, MD     Vital Signs: BP (!) 125/42   Pulse 84   Temp 99.6 F (37.6 C) (Oral)   Resp (!) 25   Ht 5\' 2"  (1.575 m)   Wt 167 lb 15.9 oz (76.2 kg)   SpO2 93%   BMI 30.73 kg/m   Physical Exam Skin:    General: Skin is warm.     Comments: Right anterior chest tube is intact Clean and dry OP milky brown No air leak in pleurvac 100 cc in vac      Imaging: CT ABDOMEN PELVIS W CONTRAST  Result Date: 10/30/2019 CLINICAL DATA:  69 year old female with abdominal pain and distension. History of esophageal carcinoma with esophagectomy and RIGHT pleural effusion/pleural catheter placement. History of prior small bowel surgery for bowel obstruction. EXAM: CT ABDOMEN AND PELVIS WITH CONTRAST TECHNIQUE: Multidetector CT imaging of the abdomen and pelvis was performed using the standard protocol following bolus administration of intravenous contrast. CONTRAST:  124mL OMNIPAQUE IOHEXOL 300 MG/ML  SOLN COMPARISON:  08/16/2019 CT prior studies FINDINGS: Lower chest: New diffuse airspace disease/consolidation/ground-glass opacities throughout the mid and LOWER LEFT lung noted with small LEFT pleural effusion which appears loculated. Scattered ground-glass opacities within the mid and LOWER RIGHT lung are present. Pleural catheters are noted within small loculated RIGHT pleural collections. Esophagectomy and gastric pull-through changes are present. Hepatobiliary: The liver and gallbladder are unremarkable. No biliary dilatation. Pancreas: Unremarkable Spleen: Unremarkable Adrenals/Urinary Tract: The kidneys, adrenal glands and bladder are unremarkable. Stomach/Bowel: Small bowel surgical anastomotic changes are noted within the LEFT abdomen. There are dilated loops of mid-distal small bowel loops without definite transition point. Contrast is noted within distal small bowel loops and colon. A LEFT LOWER quadrant percutaneous  jejunostomy tube is present without adjacent fluid or abscess. There is edematous/circumferential wall thickening of a small bowel loop within the UPPER mid pelvis measuring at least 15 cm in length (series 3: Images 66-74) without evidence of pneumatosis. This is nonspecific and may represent focal enteritis but ischemic bowel could have this appearance. There is no evidence of pneumoperitoneum or abscess. A rectal tube/drain is noted. Vascular/Lymphatic: Aortic atherosclerotic calcifications are identified. The atherosclerotic calcifications of the visualized mesenteric vessels noted which appear patent. No abnormal lymph nodes are identified. Reproductive: Status post hysterectomy. No adnexal masses. Other: A trace amount of ascites is noted adjacent to the liver and spleen. No pneumoperitoneum identified. Subcutaneous edema is present. Musculoskeletal: No acute or suspicious bony abnormalities are identified. IMPRESSION: 1. Edematous/circumferential wall thickening of a small bowel loop within the UPPER mid pelvis measuring at least 15 cm in length without evidence of pneumatosis. This is nonspecific and may represent a focal enteritis. Ischemic bowel could have this appearance but there is no evidence of pneumatosis and the visualized mesenteric vessels appear patent. No evidence of pneumoperitoneum or abscess. 2. Dilated loops of mid-distal small bowel loops without definite transition point. Contrast is  noted within distal small bowel loops and colon and this may represent a postoperative ileus. 3. Diffuse airspace disease/consolidation/ground-glass opacities throughout the visualized mid and LOWER LEFT lung and scattered ground-glass opacities within the mid and LOWER RIGHT lung. 4. LEFT LOWER quadrant percutaneous jejunostomy tube without evidence of adjacent fluid or abscess. 5. Pleural catheters within small loculated RIGHT pleural collections. Small LEFT pleural effusion which appears loculated. 6. Trace  amount of ascites. 7. Aortic Atherosclerosis (ICD10-I70.0). Electronically Signed   By: Margarette Canada M.D.   On: 10/30/2019 15:03   DG CHEST PORT 1 VIEW  Result Date: 10/30/2019 CLINICAL DATA:  History of esophagectomy EXAM: PORTABLE CHEST 1 VIEW COMPARISON:  Six days ago FINDINGS: Endotracheal tube with tip 4 mm above the carina. Right port and left PICC in stable position. Pigtail pleural catheter on the right in addition to the large bore chest tube, with reduction and right-sided pleural effusion. Asymmetric interstitial and airspace opacity on the left. No visible pneumothorax. Normal heart size. IMPRESSION: 1. Lower endotracheal tube, tip 4 mm above the carina. 2. Decreased right loculated pleural effusion after pigtail placement. 3. Unchanged asymmetric left-sided airspace disease, under treatment for pneumonia. Electronically Signed   By: Monte Fantasia M.D.   On: 10/30/2019 07:15   DG Abd Portable 1V  Result Date: 10/28/2019 CLINICAL DATA:  Jejunostomy tube leak EXAM: PORTABLE ABDOMEN - 1 VIEW COMPARISON:  10/26/2019 FINDINGS: Feeding tube in the left lower quadrant. Contrast present within left lower quadrant small bowel loops. No gross extravasation. No significant change in mild gaseous dilatation of bowel. Pigtail catheter over the right lower lateral chest with adjacent right pleural effusion or thickening. IMPRESSION: Left lower quadrant feeding tube with contrast opacification of small bowel loops. No gross extravasation on limited static view. Electronically Signed   By: Donavan Foil M.D.   On: 10/28/2019 15:29    Labs:  CBC: Recent Labs    10/28/19 0357 10/29/19 0339 10/30/19 0107 10/31/19 0400  WBC 15.6* 13.2* 17.5* 14.0*  HGB 8.5* 7.9* 8.8* 8.7*  HCT 29.2* 27.9* 30.2* 30.3*  PLT 324 269 284 309    COAGS: Recent Labs    06/12/19 0020 06/12/19 0244 10/11/19 1000 10/29/2019 0653  INR 1.0  --  1.3*  --   APTT  --  30 >200* 29    BMP: Recent Labs     06/12/19 0610 06/12/19 0610 06/13/19 0329 06/13/19 0329 06/14/19 0401 06/14/19 0401 07/12/19 0854 10/11/19 1000 10/29/19 0339 10/30/19 0107 10/31/19 0400 11/01/19 0248  NA 133*   < > 139   < > 138  --  137   < > 152* 156* 156* 154*  K 3.8   < > 3.6   < > 3.3*   < > 4.8   < > 3.7 4.2 3.5 3.8  CL 101   < > 107   < > 106   < > 97   < > 117* 123* 123* 120*  CO2 24   < > 23   < > 23   < > 25   < > 25 22 26 29   GLUCOSE 135*   < > 107*   < > 100*  --  137*   < > 219* 131* 161* 247*  BUN 18   < > 9   < > 9  --  15   < > 39* 40* 36* 38*  CALCIUM 8.0*   < > 8.2*   < > 8.1*   < >  9.6   < > 7.6* 8.1* 7.9* 8.0*  CREATININE 1.19*   < > 0.96   < > 0.89   < > 0.93   < > 0.99 0.95 0.79 0.82  GFRNONAA 47*   < > >60   < > >60   < > 63   < > >60 >60 >60 >60  GFRAA 54*  --  >60  --  >60  --  73  --   --   --   --   --    < > = values in this interval not displayed.    LIVER FUNCTION TESTS: Recent Labs    10/24/19 0337 10/25/19 0333 10/30/19 0107 10/31/19 0400  BILITOT 0.5 0.6 0.5 0.6  AST 18 16 23 17   ALT 15 14 13 12   ALKPHOS 54 60 49 54  PROT 5.4* 5.8* 5.0* 5.0*  ALBUMIN 2.5* 2.4* 1.5* 1.4*    Assessment and Plan:  Pleural effusion Right anterior chest tube placment in IR 10/18 Will follow Plan per CCM  Electronically Signed: Lavonia Drafts, PA-C 11/01/2019, 10:46 AM   I spent a total of 15 Minutes at the the patient's bedside AND on the patient's hospital floor or unit, greater than 50% of which was counseling/coordinating care for right chest tube

## 2019-11-01 NOTE — Progress Notes (Signed)
NAME:  [name], MRN:  [id number], DOB:  [birth year], LOS: 71 ADMISSION DATE:  10/29/2019, CONSULTATION DATE: 10/20/2019 REFERRING MD:  Dr. Kipp Brood, CHIEF COMPLAINT:  SOB   Brief History   [name] is a 69 y/o female with a PMHx of esophageal squamous cell carcinoma admitted on 10/08 for esophagectomy with interval development of respiratory distress requiring BiPAP and transfer to ICU.   History of present illness   [name] presented to [facility] for admission for elective esophagectomy on 10/08 that proceeded without any complications. She is currently POD 6.   Overnight, patient developed worsening work of breathing requiring NRB. She was noted to be diaphoretic with decreased lung sounds by the RN. Rapid response was called, who switched chest tube from water seal to suction. Air leak noted in [place]. CXR obtained overnight showed small right basilar pneumothorax with loculated pleural effusion, as well as interval development of extensive airspace infiltrate in the LUL. Repeat CXR this morning shows improvement in pneumothorax with no change in opacities. In the late morning, patient developed worsening respiratory distress requiring BiPAP placement and transfer to ICU for close monitoring.  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 Fib,   Significant Hospital Events   10/08 >> Admitted to [facility] 10/14 >> Transferred to ICU for respiratory distress   Consults:  PCCM  Procedures:  10/08 >> Esophagectomy 10/15 >> ETT placed, Bronchoscopy     Significant Diagnostic Tests:  CXR 10/13 >> Small right basilar pneumothorax, however, has increased in size since prior examination and there has now developed a small amount of laterally loculated pleural fluid. Extensive airspace infiltrate has now developed throughout the left upper lobe, likely infectious in the appropriate clinical setting.   CXR 10/14  >> Right basilar  pneumothorax is smaller compared to earlier in the day. No tension component. Subcutaneous air present on the right. There is extensive airspace opacity throughout the left upper lobe and left perihilar regions, similar to earlier in the day. There is patchy atelectasis in the right mid lung and right base with suspected loculated pleural effusion on the right laterally  ABD xray 10/20 > Mild diffuse air distension of the bowel, either reflecting ileus or low-grade obstruction.  Micro Data:  10/18 Pleural culture - moderate GNR 10/15 Respiratory culture - Pan-senstive PA Wound culture 10/18 >   Antimicrobials:  Zosyn 10/14 >> 10/21 Cefepime 10/21 > Flagyl 10/21 >  Interim history/subjective:   No issues overnight.  Remains on mechanical life support.  Vasopressors off at this time.  Objective   Blood pressure (!) 132/44, pulse 79, temperature 99.6 F (37.6 C), temperature source Oral, resp. rate (!) 26, height 5\' 2"  (1.575 m), weight 76.2 kg, SpO2 92 %.    Vent Mode: PSV FiO2 (%):  [45 %-50 %] 45 % Set Rate:  [26 bmp] 26 bmp Vt Set:  [300 mL] 300 mL PEEP:  [5 cmH20] 5 cmH20 Pressure Support:  [8 cmH20] 8 cmH20 Plateau Pressure:  [12 BWI20-35 cmH20] 18 cmH20   Intake/Output Summary (Last 24 hours) at 11/01/2019 0934 Last data filed at 11/01/2019 0920 Gross per 24 hour  Intake 2987.77 ml  Output 1207 ml  Net 1780.77 ml   Filed Weights   10/30/19 0304 10/31/19 0100 11/01/19 0246  Weight: 76.9 kg 76.9 kg 76.2 kg   Physical Exam: General: Elderly female, chronically ill-appearing, critically ill intubated on mechanical life support HEENT: Endotracheal tube in place,  will track appropriately Neuro: Sedated on mechanical support CV: Regular rate rhythm, S1-S2 PULM: Bilateral mechanically ventilated breath sounds GI: J-tube in place still has drainage around it not being used belly nondistended Extremities: No significant edema Skin: Wet to dry for incision on  neck  Resolved Hospital Problem list   AKI  Assessment & Plan:   Acute hypoxemic and hypercarbic Respiratory Failure  -Reintubated on 10/17. Family agreeable to trach when team is ready. However post-trach care poses a challenge due to the anastomotic leak. Discussed with CTS. For now, will keep patient intubated for now Hx of Asthma  P:  Continue to wean from full mechanical vent support, lung protective strategy, low tidal volume ventilation Wean PEEP and FiO2 to maintain sats greater than 90% Continue VAP prophylaxis, head of bed elevated greater than 30 degrees Chest x-ray as needed PAD guidelines for sedation, continue Precedex, fentanyl Antibiotics to complete 14-day course, Flagyl with stop date already given, continue fluconazole till Thursday of this week which will complete 7 days At that time we will continue to observe for any additional sign of infection. She has a positive cumulative fluid balance and would like to diurese.  However I suspect her sodium will continue to climb. We will attempt balance diuresis with Lasix plus metolazone today.  Sepsis with shock -Multiple sources including loculated pleural effusion after anastomotic leak from esophagectomy Loculated empyema secondary to anastomotic leak -S/p chest tube for source control -Culture positive for Enterobacter 10/18  Aspiration Pneumonia/Pseudomonas Aeruginosa HCAP Right Pneumothorax  -Resolved with tube thoracostomy P: Complete 14-day course of antimicrobials, cefepime continued at this time Fluconazole to stop on Thursday of this week.  Sinus Tachycardia Hx of Atrial Fibrillation  P: Lovenox full dose, weight-based  Esophageal Squamous Cell Carcinoma s/p Esophagectomy c/b anastomotic leak - Management of incision site per TCTS  P: Patient needs enteral access EGD planned by cardiothoracic surgery for tube placement  Early developing Ileus  -ABD xray 10/20 with mild diffuse air distension of  the bowel, either reflecting ileus or low-grade obstruction. P: Nothing through J-tube at this time  Non-obstructive CAD LAD saccular aneurysm  HTN  P: Holding Plavix and Imdur Continue aspirin Holding home BP meds  Hx of Hypothyroidism  P: Continue levothyroxine  High TG P: Holding propofol  Chronic normocytic anemia P: Conservative transfusion threshold for hemoglobin less than 7  Best practice:  Diet: Tube Feeds per G Tube  Pain/Anxiety/Delirium protocol (if indicated): wean off VAP protocol (if indicated): Ordered  DVT prophylaxis: full dose lovenox GI prophylaxis: Protonix Glucose control: SSI Mobility: up to chair Code Status: FULL Family Communication: Per primary.  Disposition: ICU  This patient is critically ill with multiple organ system failure; which, requires frequent high complexity decision making, assessment, support, evaluation, and titration of therapies. This was completed through the application of advanced monitoring technologies and extensive interpretation of multiple databases. During this encounter critical care time was devoted to patient care services described in this note for 33 minutes.  Garner Nash, DO [facility] 11/01/2019 9:34 AM

## 2019-11-01 NOTE — Progress Notes (Signed)
PHARMACY - TOTAL PARENTERAL NUTRITION CONSULT NOTE  Indication: Intolerance to EN / Prolonged ileus  Patient Measurements: Height: 5\' 2"  (157.5 cm) Weight: 76.2 kg (167 lb 15.9 oz) IBW/kg (Calculated) : 50.1 TPN AdjBW (KG): 56.8 Body mass index is 30.73 kg/m.  Weight = 73 kg on admit  Assessment:  32 YOF presented on 11/03/2019 for bronch, esophagectomy and J-tube placement due to esophageal cancer.  Patient was started on EN post-op on 10/15/19 and tolerated goal rate.  She also passed her swallow evaluation and started on a CLD with nocturnal TF.  She unfortunately developed right PTX and subsequently require intubation, transfer to the ICU, and chest tube placement.  TF has been interrupted frequently since 10/24/19 due to drainage around J-tube and suspected ileus vs pSBO.  Pharmacy consulted for TPN management.  Glucose / Insulin: no hx DM, A1c 5.8% - CBGs up 151-247 Required 18 units SSI 10/25 + 15 units in TPN (was on Levemir 10/d while on TF with controlled CBGs) - D5 at 5ml/hr added yesterday for hypernatremia Electrolytes: Na/CL 154/120, K 3.8, others WNL Renal: SCr 0.82, BUN 38 LFTs / TGs: LFTs / tbili WNL, TG 168 Prealbumin / albumin: prealbumin < 5, albumin down 1.4 Intake / Output; MIVF: UOP 0.6 ml/kg/hr, stool 19mL, CT O/P 55mL, net +8.8L (weight 73 > 76.2 kg, +1 pitting generalized edema) GI Imaging:  10/24 CT abd: ?focal enteritis, postoperative ileus Surgeries / Procedures: none since TPN  Central access: PICC placed 10/21/19 TPN start date: 10/30/19  Nutritional Goals (per RD rec on 10/25): 1600-1800 kCal, 100-120gm protein, >/= 1.5L fluid per day  Current Nutrition:  TPN  Plan:  Increase TPN to goal 75 ml/hr at 1800 TPN will provide 113g AA and 1609 kCal, meeting 100% of needs Electrolytes in TPN:no Na, K 23mEq/L, Ca 30mEq/L, Mag 36mEq/L, Phos 15 mmol/L, max acetate (electrolytes will increase slightly with increasing rate) Add standard MVI and trace elements  to TPN Continue CVTS SSI Q4H + increase to 25 units regular insulin in TPN D5 per CCM F/U AM labs, volume status  Salome Arnt, PharmD, BCPS Clinical Pharmacist Please see AMION for all pharmacy numbers 11/01/2019 7:29 AM

## 2019-11-01 NOTE — Progress Notes (Signed)
TCTS Evening Rounds  POD #18 s/p [place] esophagectomy  Remains on ventilator HD stable Good UOP  BP (!) 142/46   Pulse 89   Temp (!) 100.5 F (38.1 C) (Oral)   Resp (!) 29   Ht 5\' 2"  (1.575 m)   Wt 76.2 kg   SpO2 92%   BMI 30.73 kg/m   Exam unremarkable   Intake/Output Summary (Last 24 hours) at 11/01/2019 1633 Last data filed at 11/01/2019 1600 Gross per 24 hour  Intake 3923.07 ml  Output 3791 ml  Net 132.07 ml    A/p: continue current management. Jason Hauge Z. Orvan Seen, [place]

## 2019-11-01 NOTE — Progress Notes (Signed)
[address]       [employer] [id number]             [phone]                 18 Days Post-Op Procedure(s) (LRB): XI ROBOTIC ASSISTED [place] ESOPHAGECTOMY USING NIMS (N/A) VIDEO BRONCHOSCOPY (N/A) JEJUNOSTOMY PLACEMENT (N/A) INTERCOSTAL NERVE BLOCK (Right)   Events: No events _______________________________________________________________ Vitals: BP (!) 132/44    Pulse 79    Temp 99.6 F (37.6 C) (Oral)    Resp (!) 26    Ht 5\' 2"  (1.575 m)    Wt 76.2 kg    SpO2 92%    BMI 30.73 kg/m   - Neuro: sedated  - Cardiovascular: sinus  Drips: amio.      - Pulm:  Vent Mode: PSV FiO2 (%):  [45 %-50 %] 45 % Set Rate:  [26 bmp] 26 bmp Vt Set:  [300 mL] 300 mL PEEP:  [5 cmH20] 5 cmH20 Pressure Support:  [8 cmH20] 8 cmH20 Plateau Pressure:  [12 cmH20-18 cmH20] 18 cmH20  ABG    Component Value Date/Time   PHART 7.372 10/26/2019 0749   PCO2ART 54.4 (H) 10/26/2019 0749   PO2ART 105 10/26/2019 0749   HCO3 31.4 (H) 10/26/2019 0749   TCO2 33 (H) 10/26/2019 0749   ACIDBASEDEF 7.8 (H) 10/13/2019 2050   O2SAT 98.0 10/26/2019 0749    - Abd: soft.  J tube in place purulent drainage from around the J-tube site - Extremity: warm  .Intake/Output      10/25 0701 - 10/26 0700 10/26 0701 - 10/27 0700   I.V. (mL/kg) 2490.9 (32.7)    Other 60    IV Piggyback 598.4    Total Intake(mL/kg) 3149.3 (41.3)    Urine (mL/kg/hr) 1050 (0.6)    Drains 0    Stool 125    Chest Tube 16 16   Total Output 1191 16   Net +1958.3 -16           _______________________________________________________________ Labs: CBC Latest Ref Rng & Units 10/31/2019 10/30/2019 10/29/2019  WBC 4.0 - 10.5 K/uL 14.0(H) 17.5(H) 13.2(H)  Hemoglobin 12.0 - 15.0 g/dL 8.7(L) 8.8(L) 7.9(L)  Hematocrit 36 - 46 % 30.3(L) 30.2(L) 27.9(L)  Platelets 150 - 400 K/uL 309 284 269   CMP Latest Ref Rng & Units 11/01/2019 10/31/2019 10/30/2019  Glucose 70 - 99 mg/dL 247(H) 161(H) 131(H)  BUN 8 - 23  mg/dL 38(H) 36(H) 40(H)  Creatinine 0.44 - 1.00 mg/dL 0.82 0.79 0.95  Sodium 135 - 145 mmol/L 154(H) 156(H) 156(H)  Potassium 3.5 - 5.1 mmol/L 3.8 3.5 4.2  Chloride 98 - 111 mmol/L 120(H) 123(H) 123(H)  CO2 22 - 32 mmol/L 29 26 22   Calcium 8.9 - 10.3 mg/dL 8.0(L) 7.9(L) 8.1(L)  Total Protein 6.5 - 8.1 g/dL - 5.0(L) 5.0(L)  Total Bilirubin 0.3 - 1.2 mg/dL - 0.6 0.5  Alkaline Phos 38 - 126 U/L - 54 49  AST 15 - 41 U/L - 17 23  ALT 0 - 44 U/L - 12 13      _______________________________________________________________  Assessment and Plan: POD 18 s/p Robotic 3 field esophagectomy.  Respiratory failure, cervical anastamotic leak.    CT scan reviewed.  Area of small bowel the was evidence concerning for enteritis.  She is currently on antibiotics and this will continue for a full 2-week course. In regards to her respiratory failure she has evidence of interstitial changes in the left  lung.  She will require ongoing ventilatory support.  Neuro: pain controlled CV: sinus, continue amio gtt for now.  On neo at 4 for sepsis Pulm: discussed case with ICU team, trach and incision care will be challenging.  Will continue vent management for now.  No rush for trach.  Minimal CT output from either drain.  Contrast is well drained on cross-sectional imaging.  Will keep both until pt is extubated. Renal: stable GI: J tube feeds on hold for now.  Currently receiving TPN.  Prior to starting tube feeds are likely will perform an EGD and place a nasoduodenal tube. Heme: stable ID: wet to dry dressing to cervical incision.  Continue abx of asp pneumonia Endo: SSI  Dispo: continue ICU care  Melodie Bouillon, MD 11/01/2019 9:12 AM

## 2019-11-01 NOTE — Progress Notes (Signed)
Physical Therapy Treatment Patient Details Name: [name] MRN: [id number] DOB: 09/06/1950 Today's Date: 11/01/2019    History of Present Illness 69 yo with history of esophageal CA admitted for esophagectomy with jejunostomy placement Pt with respiratory distress on 10/14, transferred to ICU and intubated on 10/15 with one failed attempt to extubate. Additional PMHx: CoPD, Asthma, HTN, HLD, hypothyroidism, CAD    PT Comments    Pt admitted with above diagnosis. Bed level eval completed as pt still lethargic and decr participation.  Was able to assist with exercise but fatigued quickly and is weak overall.  Bil hands swollen and difficult for pt to move them.  Pt currently with functional limitations due to balance and endurance deficits. Pt will benefit from skilled PT to increase their independence and safety with mobility to allow discharge to the venue listed below.     Follow Up Recommendations  CIR;Supervision/Assistance - 24 hour     Equipment Recommendations  Other (comment) (TBA)    Recommendations for Other Services Rehab consult     Precautions / Restrictions Precautions Precautions: Fall;Other (comment) Precaution Comments: 2 chest tubes, j tube, ETT Restrictions Weight Bearing Restrictions: No    Mobility  Bed Mobility                  Transfers                    Ambulation/Gait                 Stairs             Wheelchair Mobility    Modified Rankin (Stroke Patients Only)       Balance                                            Cognition Arousal/Alertness: Awake/alert Behavior During Therapy: WFL for tasks assessed/performed Overall Cognitive Status: Difficult to assess                                 General Comments: ETT, but answering yes/no questions      Exercises General Exercises - Upper Extremity Shoulder Flexion: Both;AAROM;5 reps;Seated Shoulder Extension: Both;10  reps;Supine;AAROM Shoulder Horizontal ADduction: Both;10 reps;Supine;AAROM Elbow Flexion: Both;AAROM;5 reps;Seated Elbow Extension: AAROM;Both;5 reps;Seated Wrist Flexion: PROM;Both;10 reps;Supine Wrist Extension: PROM;Both;10 reps;Supine Digit Composite Flexion: PROM;Both;10 reps;Supine Composite Extension: PROM;Both;10 reps;Supine General Exercises - Lower Extremity Ankle Circles/Pumps: AROM;Both;10 reps;Seated Quad Sets: AROM;Both;10 reps;Supine Heel Slides: Both;Supine;AAROM;5 reps Hip ABduction/ADduction: Both;Supine;5 reps;AAROM    General Comments        Pertinent Vitals/Pain Pain Assessment: Faces Faces Pain Scale: Hurts little more Pain Location: generalized and R chest tube site Pain Descriptors / Indicators: Aching;Sore Pain Intervention(s): Limited activity within patient's tolerance;Monitored during session;Repositioned    Home Living                      Prior Function            PT Goals (current goals can now be found in the care plan section) Acute Rehab PT Goals Patient Stated Goal: return home Progress towards PT goals: Progressing toward goals    Frequency    Min 3X/week      PT Plan Current plan remains appropriate    Co-evaluation  AM-PAC PT "6 Clicks" Mobility   Outcome Measure  Help needed turning from your back to your side while in a flat bed without using bedrails?: A Little Help needed moving from lying on your back to sitting on the side of a flat bed without using bedrails?: A Lot Help needed moving to and from a bed to a chair (including a wheelchair)?: Total Help needed standing up from a chair using your arms (e.g., wheelchair or bedside chair)?: Total Help needed to walk in hospital room?: Total Help needed climbing 3-5 steps with a railing? : Total 6 Click Score: 9    End of Session Equipment Utilized During Treatment: Other (comment) (Vent) Activity Tolerance: Patient limited by  fatigue Patient left: with call bell/phone within reach;in bed;with bed alarm set;with SCD's reapplied;with restraints reapplied (with pt in chair position) Nurse Communication: Mobility status PT Visit Diagnosis: Other abnormalities of gait and mobility (R26.89);Difficulty in walking, not elsewhere classified (R26.2);Muscle weakness (generalized) (M62.81)     Time: 7106-2694 PT Time Calculation (min) (ACUTE ONLY): 10 min  Charges:  $Therapeutic Exercise: 8-22 mins                     Emberlynn Riggan W,PT Acute Rehabilitation Services Pager:  [phone]  Office:  [place] 11/01/2019, 11:50 AM

## 2019-11-02 DIAGNOSIS — J9601 Acute respiratory failure with hypoxia: Secondary | ICD-10-CM | POA: Diagnosis not present

## 2019-11-02 DIAGNOSIS — C159 Malignant neoplasm of esophagus, unspecified: Secondary | ICD-10-CM | POA: Diagnosis not present

## 2019-11-02 LAB — GLUCOSE, CAPILLARY
Glucose-Capillary: 151 mg/dL — ABNORMAL HIGH (ref 70–99)
Glucose-Capillary: 153 mg/dL — ABNORMAL HIGH (ref 70–99)
Glucose-Capillary: 169 mg/dL — ABNORMAL HIGH (ref 70–99)
Glucose-Capillary: 181 mg/dL — ABNORMAL HIGH (ref 70–99)
Glucose-Capillary: 182 mg/dL — ABNORMAL HIGH (ref 70–99)
Glucose-Capillary: 188 mg/dL — ABNORMAL HIGH (ref 70–99)

## 2019-11-02 LAB — BASIC METABOLIC PANEL
Anion gap: 8 (ref 5–15)
BUN: 43 mg/dL — ABNORMAL HIGH (ref 8–23)
CO2: 31 mmol/L (ref 22–32)
Calcium: 7.6 mg/dL — ABNORMAL LOW (ref 8.9–10.3)
Chloride: 113 mmol/L — ABNORMAL HIGH (ref 98–111)
Creatinine, Ser: 0.86 mg/dL (ref 0.44–1.00)
GFR, Estimated: >60 mL/min (ref >60)
Glucose, Bld: 190 mg/dL — ABNORMAL HIGH (ref 70–99)
Potassium: 3.4 mmol/L — ABNORMAL LOW (ref 3.5–5.1)
Sodium: 152 mmol/L — ABNORMAL HIGH (ref 135–145)

## 2019-11-02 LAB — MAGNESIUM: Magnesium: 1.7 mg/dL (ref 1.7–2.4)

## 2019-11-02 LAB — PHOSPHORUS: Phosphorus: 3.1 mg/dL (ref 2.5–4.6)

## 2019-11-02 MED ORDER — MIDAZOLAM HCL 2 MG/2ML IJ SOLN
INTRAMUSCULAR | Status: AC
  Filled 2019-11-02: qty 2

## 2019-11-02 MED ORDER — FREE WATER
100.0000 mL | Freq: Four times a day (QID) | Status: DC
  Administered 2019-11-02 – 2019-11-04 (×8): 100 mL

## 2019-11-02 MED ORDER — POTASSIUM CHLORIDE 10 MEQ/50ML IV SOLN
10.0000 meq | INTRAVENOUS | Status: AC
  Administered 2019-11-02 (×3): 10 meq via INTRAVENOUS
  Filled 2019-11-02 (×3): qty 50

## 2019-11-02 MED ORDER — MAGNESIUM SULFATE 4 GM/100ML IV SOLN
4.0000 g | Freq: Once | INTRAVENOUS | Status: AC
  Administered 2019-11-02: 4 g via INTRAVENOUS
  Filled 2019-11-02: qty 100

## 2019-11-02 MED ORDER — MIDAZOLAM HCL 2 MG/2ML IJ SOLN
INTRAMUSCULAR | Status: AC
  Administered 2019-11-02: 2 mg
  Filled 2019-11-02: qty 2

## 2019-11-02 MED ORDER — TRAVASOL 10 % IV SOLN
INTRAVENOUS | Status: AC
  Filled 2019-11-02: qty 1134

## 2019-11-02 MED ORDER — FUROSEMIDE 10 MG/ML IJ SOLN
40.0000 mg | Freq: Once | INTRAMUSCULAR | Status: AC
  Administered 2019-11-02: 40 mg via INTRAVENOUS
  Filled 2019-11-02: qty 4

## 2019-11-02 MED ORDER — POTASSIUM CHLORIDE 20 MEQ PO PACK
40.0000 meq | PACK | Freq: Four times a day (QID) | ORAL | Status: AC
  Administered 2019-11-02 (×2): 40 meq
  Filled 2019-11-02 (×2): qty 2

## 2019-11-02 MED ORDER — MIDAZOLAM HCL 2 MG/2ML IJ SOLN
INTRAMUSCULAR | Status: AC
  Filled 2019-11-02: qty 4

## 2019-11-02 MED ORDER — FENTANYL CITRATE (PF) 100 MCG/2ML IJ SOLN
INTRAMUSCULAR | Status: AC
  Filled 2019-11-02: qty 2

## 2019-11-02 MED ORDER — POTASSIUM CHLORIDE 10 MEQ/50ML IV SOLN
10.0000 meq | INTRAVENOUS | Status: DC
  Filled 2019-11-02: qty 50

## 2019-11-02 MED ORDER — METOLAZONE 5 MG PO TABS
5.0000 mg | ORAL_TABLET | Freq: Once | ORAL | Status: AC
  Administered 2019-11-02: 5 mg
  Filled 2019-11-02: qty 1

## 2019-11-02 MED ORDER — MIDAZOLAM HCL 2 MG/2ML IJ SOLN: 1.0000 mg | Freq: Once | INTRAMUSCULAR | Status: AC

## 2019-11-02 NOTE — Progress Notes (Signed)
Patient ID: [name], female   DOB: Jun 13, 1950, 69 y.o.   MRN: [id number] TCTS Evening Rounds:  Hemodynamically stable on NE 4 mcg.  Remains on vent  Good urine output.  EGD today showed large anastomotic leak. NG tube placed to pylorus for feeding.

## 2019-11-02 NOTE — Progress Notes (Addendum)
NAME:  [name], MRN:  [id number], DOB:  December 15, 1950, LOS: 36 ADMISSION DATE:  10/16/2019, CONSULTATION DATE: 10/20/2019 REFERRING MD:  Dr. Kipp Brood, CHIEF COMPLAINT:  SOB   Brief History   [name] is a 69 y/o female with a PMHx of esophageal squamous cell carcinoma admitted on 10/08 for esophagectomy with interval development of respiratory distress requiring BiPAP and transfer to ICU.   History of present illness   [name] presented to [facility] for admission for elective esophagectomy on 10/08 that proceeded without any complications. She is currently POD 6.   Overnight, patient developed worsening work of breathing requiring NRB. She was noted to be diaphoretic with decreased lung sounds by the RN. Rapid response was called, who switched chest tube from water seal to suction. Air leak noted in [place]. CXR obtained overnight showed small right basilar pneumothorax with loculated pleural effusion, as well as interval development of extensive airspace infiltrate in the LUL. Repeat CXR this morning shows improvement in pneumothorax with no change in opacities. In the late morning, patient developed worsening respiratory distress requiring BiPAP placement and transfer to ICU for close monitoring.  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 Fib,   Significant Hospital Events   10/08 >> Admitted to [facility] 10/14 >> Transferred to ICU for respiratory distress   Consults:  PCCM  Procedures:  10/08 >> Esophagectomy 10/15 >> ETT placed, Bronchoscopy     Significant Diagnostic Tests:  CXR 10/13 >> Small right basilar pneumothorax, however, has increased in size since prior examination and there has now developed a small amount of laterally loculated pleural fluid. Extensive airspace infiltrate has now developed throughout the left upper lobe, likely infectious in the appropriate clinical setting.   CXR 10/14  >> Right basilar  pneumothorax is smaller compared to earlier in the day. No tension component. Subcutaneous air present on the right. There is extensive airspace opacity throughout the left upper lobe and left perihilar regions, similar to earlier in the day. There is patchy atelectasis in the right mid lung and right base with suspected loculated pleural effusion on the right laterally  ABD xray 10/20 > Mild diffuse air distension of the bowel, either reflecting ileus or low-grade obstruction.  Micro Data:  10/18 Pleural culture - moderate GNR 10/15 Respiratory culture - Pan-senstive PA Wound culture 10/18 >   Antimicrobials:  Zosyn 10/14 >> 10/21 Cefepime 10/21 > Flagyl 10/21 >  Interim history/subjective:  Sedated on vent  No acute events overnight  Diurese  4.5L yesterday   Objective   Blood pressure (!) 114/38, pulse 69, temperature 99.4 F (37.4 C), temperature source Oral, resp. rate 20, height 5\' 2"  (1.575 m), weight 77.4 kg, SpO2 97 %.    Vent Mode: PRVC FiO2 (%):  [45 %-50 %] 50 % Set Rate:  [26 bmp] 26 bmp Vt Set:  [300 mL] 300 mL PEEP:  [5 cmH20-8 cmH20] 8 cmH20 Pressure Support:  [8 cmH20] 8 cmH20   Intake/Output Summary (Last 24 hours) at 11/02/2019 0727 Last data filed at 11/02/2019 0700 Gross per 24 hour  Intake 3885.49 ml  Output 4568 ml  Net -682.51 ml   Filed Weights   10/31/19 0100 11/01/19 0246 11/02/19 0500  Weight: 76.9 kg 76.2 kg 77.4 kg   Physical Exam: General: Chronically ill appearing elderly female on mechanical ventilation lying in bed in NAD HEENT: ETT, MM pink/moist, PERRL,  Neuro: Sedated on vent  CV: s1s2  regular rate and rhythm, no murmur, rubs, or gallops,  PULM:  Bilateral faint rhonchi, tolerating vent well, no increased work of breathing  GI: soft, bowel sounds active in all 4 quadrants, non-tender, non-distended Extremities: warm/dry, no edema  Skin: no rashes or lesions  Resolved Hospital Problem list   AKI  Assessment & Plan:   Acute  hypoxemic and hypercarbic Respiratory Failure  -Reintubated on 10/17. Family agreeable to trach when team is ready. However post-trach care poses a challenge due to the anastomotic leak. Discussed with CTS. For now, will keep patient intubated for now Hx of Asthma  P: Continue ventilator support with lung protective strategies  Wean PEEP and FiO2 for sats greater than 90%. Head of bed elevated 30 degrees. Plateau pressures less than 30 cm H20.  Follow intermittent chest x-ray and ABG.   SAT/SBT as tolerated, mentation preclude extubation  Ensure adequate pulmonary hygiene  Follow cultures  VAP bundle in place  PAD protocol, continue precedex and fentanyl  Plan for 14 days of flagyl and 7 days of fluconazole, stop days in place  Continued attempts to diurese, diuresed well yesterday   Sepsis with shock -Multiple sources including loculated pleural effusion after anastomotic leak from esophagectomy Loculated empyema secondary to anastomotic leak -S/p chest tube for source control -Culture positive for Enterobacter 10/18  Aspiration Pneumonia/Pseudomonas Aeruginosa HCAP Right Pneumothorax  -Resolved with tube thoracostomy P: Plan for 14 days of flagyl and 7 days of fluconazole, stop days in place  Supportive care  Sinus Tachycardia Hx of Atrial Fibrillation  P: Continue full dose lovenox   Esophageal Squamous Cell Carcinoma s/p Esophagectomy c/b anastomotic leak - Management of incision site per TCTS  P: Planned for EGD 10/27 per CTS  Early developing Ileus  -ABD xray 10/20 with mild diffuse air distension of the bowel, either reflecting ileus or low-grade obstruction. P: Continue NPO status, nothing through J-tube   Non-obstructive CAD LAD saccular aneurysm  HTN  P: Holding Plavix and Imdur Continue ASA Home BP meds on hold  Hx of Hypothyroidism  P: Continue supplemental Levothyroxine   High TG P: Holding propofol  Trend TG  Chronic normocytic  anemia P: Tend CBC Hemoglobin goal greater than 7   Best practice:  Diet: Tube Feeds per G Tube  Pain/Anxiety/Delirium protocol (if indicated): wean off VAP protocol (if indicated): Ordered  DVT prophylaxis: full dose lovenox GI prophylaxis: Protonix Glucose control: SSI Mobility: up to chair Code Status: FULL Family Communication: Per primary.  Disposition: ICU  CRITICAL CARE Performed by: Johnsie Cancel  Total critical care time: 37 minutes  Critical care time was exclusive of separately billable procedures and treating other patients.  Critical care was necessary to treat or prevent imminent or life-threatening deterioration.  Critical care was time spent personally by me on the following activities: development of treatment plan with patient and/or surrogate as well as nursing, discussions with consultants, evaluation of patient's response to treatment, examination of patient, obtaining history from patient or surrogate, ordering and performing treatments and interventions, ordering and review of laboratory studies, ordering and review of radiographic studies, pulse oximetry and re-evaluation of patient's condition.  Johnsie Cancel, NP-C [facility] Contact / Pager information can be found on Amion  11/02/2019, 7:38 AM   PCCM attending:  69 year old female past medical history of squamous cell carcinoma admitted on 10/19/2019 for esophagectomy.  Complicated by respiratory failure, aspiration intubation and mechanical ventilation.  Additionally had septic shock related to loculated pleural  effusion, loculated empyema from an anastomotic leak.  Pseudomonas HCAP and right-sided pneumothorax.  This morning patient underwent EGD with placement of gastric tube by Dr. Kipp Brood.  BP (!) 106/42   Pulse 77   Temp 99 F (37.2 C) (Oral)   Resp (!) 28   Ht 5\' 2"  (1.575 m)   Wt 77.4 kg   SpO2 93%   BMI 31.21 kg/m   General: Elderly female intubated on  mechanical life support, critically ill, chronically ill-appearing HEENT: Endotracheal tube in place Heart: Regular rate rhythm, S1-S2 Lungs: Bilateral mechanically ventilated breath sounds, diminished on the right compared to the left.  Labs: Reviewed  Assessment: Acute hypoxemic hypercarbic respiratory failure requiring intubation mechanical ventilation. Septic shock, polymicrobial sepsis, anastomotic leak from esophagectomy, loculated empyema, aspiration pneumonia secondary to Pseudomonas HCAP. 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 J-tube malfunction. Atrial fibrillation  Plan: EGD today with gastric enteral access placed. Continue to diurese with Lasix Stop D5 Start free water replacement enterally. Follow urine output, BMP Wean PEEP and FiO2 as tolerated to maintain sats greater than 90%. Now on full dose Lovenox.  This patient is critically ill with multiple organ system failure; which, requires frequent high complexity decision making, assessment, support, evaluation, and titration of therapies. This was completed through the application of advanced monitoring technologies and extensive interpretation of multiple databases. During this encounter critical care time was devoted to patient care services described in this note for 33 minutes.  Garner Nash, DO [facility] 11/02/2019 12:41 PM

## 2019-11-02 NOTE — Procedures (Signed)
     [address]       [place], [address]             [phone]       Pre-op Dx: S/p 3-field esophagectomy   anastamotic leak   Respiratory failure 2/2 aspiration pneumonia   Malnutrition with non-function Jejunostomy tube   Hx of small bowel resection   Small bowel obstruction 2/2 to enteritis Post-Op Dx:  Same Procedure: esophagogastroscopy with placement of 29F nasogastric tube  Findings: - large anastomotic leak at 20cm.  Fibrinous exudate around the defect.  Defect through posterior staples.  The scope was able to pass into the pleural space.  The gastric conduit was viable, with good healthy mucosa throughout.  Nasogastric tube was passed under direct visualization and parked next to the pylorus, which was widely patent.  Clotilde Loth Bary Leriche

## 2019-11-02 NOTE — Progress Notes (Signed)
PT Cancellation Note  Patient Details Name: SYMONE CORNMAN MRN: [id number] DOB: Jan 19, 1950   Cancelled Treatment:    Reason Eval/Treat Not Completed: Medical issues which prohibited therapy (Pt sedated due to procedure this am.  HOLD today per nurse)   Denice Paradise 11/02/2019, 9:40 AM Mabell Esguerra W,PT Acute Rehabilitation Services Pager:  [phone]  Office:  [phone]

## 2019-11-02 NOTE — Progress Notes (Signed)
OT Cancellation Note  Patient Details Name: [name] MRN: [id number] DOB: 1950-12-27   Cancelled Treatment:    Reason Eval/Treat Not Completed: Medical issues which prohibited therapy (pt sedated on vent following earlier procedure per RN)  Malka So 11/02/2019, 9:40 AM  Nestor Lewandowsky, OTR/L Acute Rehabilitation Services Pager: [phone] Office: [phone]

## 2019-11-02 NOTE — Progress Notes (Signed)
[address]       [place] [address]             [phone]                 19 Days Post-Op Procedure(s) (LRB): XI ROBOTIC ASSISTED [place] ESOPHAGECTOMY USING NIMS (N/A) VIDEO BRONCHOSCOPY (N/A) JEJUNOSTOMY PLACEMENT (N/A) INTERCOSTAL NERVE BLOCK (Right)   Events: No events _______________________________________________________________ Vitals: BP (!) 107/38   Pulse 66   Temp 99 F (37.2 C) (Oral)   Resp 19   Ht 5\' 2"  (1.575 m)   Wt 77.4 kg   SpO2 95%   BMI 31.21 kg/m   - Neuro: sedated. arousable  - Cardiovascular: sinus  Drips: amio.      - Pulm:  Vent Mode: PRVC FiO2 (%):  [45 %-50 %] 50 % Set Rate:  [26 bmp] 26 bmp Vt Set:  [300 mL] 300 mL PEEP:  [8 cmH20] 8 cmH20 Pressure Support:  [8 cmH20] 8 cmH20 Plateau Pressure:  [11 cmH20-21 cmH20] 11 cmH20  ABG    Component Value Date/Time   PHART 7.372 10/26/2019 0749   PCO2ART 54.4 (H) 10/26/2019 0749   PO2ART 105 10/26/2019 0749   HCO3 31.4 (H) 10/26/2019 0749   TCO2 33 (H) 10/26/2019 0749   ACIDBASEDEF 7.8 (H) 10/24/2019 2050   O2SAT 98.0 10/26/2019 0749    - Abd: soft.  J tube in place purulent drainage from around the J-tube site - Extremity: warm  .Intake/Output      10/26 0701 - 10/27 0700 10/27 0701 - 10/28 0700   I.V. (mL/kg) 3182 (41.1) 152.1 (2)   Other     IV Piggyback 703.5    Total Intake(mL/kg) 3885.5 (50.2) 152.1 (2)   Urine (mL/kg/hr) 4450 (2.4)    Drains     Stool 100    Chest Tube 18    Total Output 4568    Net -682.5 +152.1           _______________________________________________________________ Labs: CBC Latest Ref Rng & Units 10/31/2019 10/30/2019 10/29/2019  WBC 4.0 - 10.5 K/uL 14.0(H) 17.5(H) 13.2(H)  Hemoglobin 12.0 - 15.0 g/dL 8.7(L) 8.8(L) 7.9(L)  Hematocrit 36 - 46 % 30.3(L) 30.2(L) 27.9(L)  Platelets 150 - 400 K/uL 309 284 269   CMP Latest Ref Rng & Units 11/02/2019 11/01/2019 10/31/2019  Glucose 70 - 99 mg/dL 190(H) 247(H) 161(H)    BUN 8 - 23 mg/dL 43(H) 38(H) 36(H)  Creatinine 0.44 - 1.00 mg/dL 0.86 0.82 0.79  Sodium 135 - 145 mmol/L 152(H) 154(H) 156(H)  Potassium 3.5 - 5.1 mmol/L 3.4(L) 3.8 3.5  Chloride 98 - 111 mmol/L 113(H) 120(H) 123(H)  CO2 22 - 32 mmol/L 31 29 26   Calcium 8.9 - 10.3 mg/dL 7.6(L) 8.0(L) 7.9(L)  Total Protein 6.5 - 8.1 g/dL - - 5.0(L)  Total Bilirubin 0.3 - 1.2 mg/dL - - 0.6  Alkaline Phos 38 - 126 U/L - - 54  AST 15 - 41 U/L - - 17  ALT 0 - 44 U/L - - 12      _______________________________________________________________  Assessment and Plan: POD 19 s/p Robotic 3 field esophagectomy.  Respiratory failure, cervical anastamotic leak.    CT scan reviewed.  Area of small bowel the was evidence concerning for enteritis.  She is currently on antibiotics and this will continue for a full 2-week course. In regards to her respiratory failure she has evidence of interstitial changes in the left lung.  She will  require ongoing ventilatory support.  Neuro: pain controlled CV: sinus, continue amio gtt for now.  On neo at 2 for sepsis Pulm: will need diuresis prior to extubation.  Holding off on trach for now Contrast is well drained on cross-sectional imaging.  Will keep both until pt is extubated. Renal: hypernatremia.  Will replace enterically, and start diuresis  GI: will perform EGD today with placement of nasogastric feeding tube. Will go slow with restarting feeds.  Mostly to treat hypernatremia J tube feeds on hold for now.  Currently receiving TPN.  Prior to starting tube feeds are likely will perform an EGD and place a nasoduodenal tube.  Heme: stable ID: wet to dry dressing to cervical incision.  Continue abx of asp pneumonia Endo: SSI  Dispo: continue ICU care  Melodie Bouillon, MD 11/02/2019 1:22 PM

## 2019-11-02 NOTE — Progress Notes (Signed)
PHARMACY - TOTAL PARENTERAL NUTRITION CONSULT NOTE  Indication: Intolerance to EN / Prolonged ileus  Patient Measurements: Height: 5\' 2"  (157.5 cm) Weight: 77.4 kg (170 lb 10.2 oz) IBW/kg (Calculated) : 50.1 TPN AdjBW (KG): 56.8 Body mass index is 31.21 kg/m.  Weight = 73 kg on admit  Assessment:  59 YOF presented on 10/20/2019 for bronch, esophagectomy and J-tube placement due to esophageal cancer.  Patient was started on EN post-op on 10/15/19 and tolerated goal rate.  She also passed her swallow evaluation and started on a CLD with nocturnal TF.  She unfortunately developed right PTX and subsequently require intubation, transfer to the ICU, and chest tube placement.  TF has been interrupted frequently since 10/24/19 due to drainage around J-tube and suspected ileus vs pSBO.  Pharmacy consulted for TPN management.  Glucose / Insulin: no hx DM, A1c 5.8% - CBGs mostly at goal 146-190 Required 16 units SSI 10/26 + 25 units in TPN (was on Levemir 10/d while on TF with controlled CBGs) - D5 at 68ml/hr added 10/24 for hypernatremia, continues Electrolytes: Na/CL 152/113, K 3.4, Mg 1.7 - likely related to diuresis yesterday Renal: SCr 0.86, BUN 43 LFTs / TGs: LFTs / tbili WNL, TG 168 Prealbumin / albumin: prealbumin < 5, albumin down 1.4 Intake / Output; MIVF: UOP 2.4 ml/kg/hr, stool 178mL, CT O/P 58mL, net +7.2L (weight 73 > 77.4 kg) - lasix 40mg  IV x 3 doses yesterday GI Imaging:  10/24 CT abd: ?focal enteritis, postoperative ileus Surgeries / Procedures: none since TPN  Central access: PICC placed 10/21/19 TPN start date: 10/30/19  Nutritional Goals (per RD rec on 10/25): 1600-1800 kCal, 100-120gm protein, >/= 1.5L fluid per day  Current Nutrition:  TPN  Plan:  Continue TPN at goal rate 75 ml/hr  TPN will provide 113g AA and 1609 kCal, meeting 100% of needs Electrolytes in TPN:no Na, increase K to 79mEq/L, Ca 52mEq/L, increase Mag to 39mEq/L, Phos 15 mmol/L, max acetate  Mg and KCl  already supplemented  Add standard MVI and trace elements to TPN Continue CVTS SSI Q4H + continue 25 units regular insulin in TPN D5 per CCM F/U AM labs, volume status  Salome Arnt, PharmD, BCPS Clinical Pharmacist Please see AMION for all pharmacy numbers 11/02/2019 7:21 AM

## 2019-11-03 ENCOUNTER — Inpatient Hospital Stay (HOSPITAL_COMMUNITY): Payer: Medicare Other

## 2019-11-03 DIAGNOSIS — J9601 Acute respiratory failure with hypoxia: Secondary | ICD-10-CM | POA: Diagnosis not present

## 2019-11-03 DIAGNOSIS — C159 Malignant neoplasm of esophagus, unspecified: Secondary | ICD-10-CM | POA: Diagnosis not present

## 2019-11-03 LAB — GLUCOSE, CAPILLARY
Glucose-Capillary: 152 mg/dL — ABNORMAL HIGH (ref 70–99)
Glucose-Capillary: 152 mg/dL — ABNORMAL HIGH (ref 70–99)
Glucose-Capillary: 155 mg/dL — ABNORMAL HIGH (ref 70–99)
Glucose-Capillary: 171 mg/dL — ABNORMAL HIGH (ref 70–99)
Glucose-Capillary: 191 mg/dL — ABNORMAL HIGH (ref 70–99)
Glucose-Capillary: 195 mg/dL — ABNORMAL HIGH (ref 70–99)

## 2019-11-03 LAB — CBC
HCT: 26.4 % — ABNORMAL LOW (ref 36.0–46.0)
Hemoglobin: 7.5 g/dL — ABNORMAL LOW (ref 12.0–15.0)
MCH: 29.4 pg (ref 26.0–34.0)
MCHC: 28.4 g/dL — ABNORMAL LOW (ref 30.0–36.0)
MCV: 103.5 fL — ABNORMAL HIGH (ref 80.0–100.0)
Platelets: 181 10*3/uL (ref 150–400)
RBC: 2.55 MIL/uL — ABNORMAL LOW (ref 3.87–5.11)
RDW: 16.1 % — ABNORMAL HIGH (ref 11.5–15.5)
WBC: 11.4 10*3/uL — ABNORMAL HIGH (ref 4.0–10.5)
nRBC: 0 % (ref 0.0–0.2)

## 2019-11-03 LAB — COMPREHENSIVE METABOLIC PANEL
ALT: 13 U/L (ref 0–44)
AST: 19 U/L (ref 15–41)
Albumin: 1.3 g/dL — ABNORMAL LOW (ref 3.5–5.0)
Alkaline Phosphatase: 50 U/L (ref 38–126)
Anion gap: 9 (ref 5–15)
BUN: 43 mg/dL — ABNORMAL HIGH (ref 8–23)
CO2: 29 mmol/L (ref 22–32)
Calcium: 7.7 mg/dL — ABNORMAL LOW (ref 8.9–10.3)
Chloride: 106 mmol/L (ref 98–111)
Creatinine, Ser: 0.71 mg/dL (ref 0.44–1.00)
GFR, Estimated: >60 mL/min (ref >60)
Glucose, Bld: 145 mg/dL — ABNORMAL HIGH (ref 70–99)
Potassium: 3.6 mmol/L (ref 3.5–5.1)
Sodium: 144 mmol/L (ref 135–145)
Total Bilirubin: 0.6 mg/dL (ref 0.3–1.2)
Total Protein: 5.1 g/dL — ABNORMAL LOW (ref 6.5–8.1)

## 2019-11-03 LAB — HEPARIN ANTI-XA: Heparin LMW: 1.19 IU/mL

## 2019-11-03 LAB — PHOSPHORUS: Phosphorus: 2.5 mg/dL (ref 2.5–4.6)

## 2019-11-03 LAB — MAGNESIUM: Magnesium: 2.3 mg/dL (ref 1.7–2.4)

## 2019-11-03 MED ORDER — ENOXAPARIN SODIUM 60 MG/0.6ML ~~LOC~~ SOLN
60.0000 mg | Freq: Two times a day (BID) | SUBCUTANEOUS | Status: DC
  Administered 2019-11-03 – 2019-11-07 (×9): 60 mg via SUBCUTANEOUS
  Filled 2019-11-03 (×10): qty 0.6

## 2019-11-03 MED ORDER — POTASSIUM & SODIUM PHOSPHATES 280-160-250 MG PO PACK
1.0000 | PACK | Freq: Two times a day (BID) | ORAL | Status: AC
  Administered 2019-11-03 (×2): 1
  Filled 2019-11-03 (×2): qty 1

## 2019-11-03 MED ORDER — METOLAZONE 5 MG PO TABS
5.0000 mg | ORAL_TABLET | Freq: Once | ORAL | Status: AC
  Administered 2019-11-03: 5 mg
  Filled 2019-11-03: qty 1

## 2019-11-03 MED ORDER — POTASSIUM CHLORIDE 20 MEQ PO PACK
40.0000 meq | PACK | Freq: Four times a day (QID) | ORAL | Status: AC
  Administered 2019-11-03 (×3): 40 meq
  Filled 2019-11-03 (×3): qty 2

## 2019-11-03 MED ORDER — VITAL 1.5 CAL PO LIQD
1000.0000 mL | ORAL | Status: DC
  Administered 2019-11-03 – 2019-11-05 (×2): 1000 mL

## 2019-11-03 MED ORDER — TRAVASOL 10 % IV SOLN
INTRAVENOUS | Status: AC
  Filled 2019-11-03: qty 1134

## 2019-11-03 MED ORDER — FUROSEMIDE 10 MG/ML IJ SOLN
40.0000 mg | Freq: Two times a day (BID) | INTRAMUSCULAR | Status: DC
  Administered 2019-11-03 – 2019-11-05 (×5): 40 mg via INTRAVENOUS
  Filled 2019-11-03 (×5): qty 4

## 2019-11-03 NOTE — Progress Notes (Signed)
[address]       [place] [address]             [phone]                 20 Days Post-Op Procedure(s) (LRB): XI ROBOTIC ASSISTED [place] ESOPHAGECTOMY USING NIMS (N/A) VIDEO BRONCHOSCOPY (N/A) JEJUNOSTOMY PLACEMENT (N/A) INTERCOSTAL NERVE BLOCK (Right)   Events: No events _______________________________________________________________ Vitals: BP (!) 128/49   Pulse 83   Temp 99.5 F (37.5 C) (Oral)   Resp (!) 27   Ht 5\' 2"  (1.575 m)   Wt 74.1 kg   SpO2 92%   BMI 29.88 kg/m   - Neuro: sedated. arousable  - Cardiovascular: sinus  Drips: amio.      - Pulm:  Vent Mode: PRVC FiO2 (%):  [40 %-50 %] 40 % Set Rate:  [26 bmp] 26 bmp Vt Set:  [300 mL] 300 mL PEEP:  [8 cmH20] 8 cmH20 Plateau Pressure:  [17 cmH20-22 cmH20] 17 cmH20  ABG    Component Value Date/Time   PHART 7.372 10/26/2019 0749   PCO2ART 54.4 (H) 10/26/2019 0749   PO2ART 105 10/26/2019 0749   HCO3 31.4 (H) 10/26/2019 0749   TCO2 33 (H) 10/26/2019 0749   ACIDBASEDEF 7.8 (H) 10/22/2019 2050   O2SAT 98.0 10/26/2019 0749    - Abd: soft.  J tube in place  - Extremity: warm  .Intake/Output      10/27 0701 - 10/28 0700 10/28 0701 - 10/29 0700   I.V. (mL/kg) 2221.1 (30) 458.1 (6.2)   Other  45   NG/GT 400 330   IV Piggyback 405.1 95.1   Total Intake(mL/kg) 3026.2 (40.8) 928.2 (12.5)   Urine (mL/kg/hr) 3600 (2) 375 (0.9)   Stool 100    Chest Tube 30 0   Total Output 3730 375   Net -703.9 +553.2        Urine Occurrence  1 x   Stool Occurrence 1 x       _______________________________________________________________ Labs: CBC Latest Ref Rng & Units 11/03/2019 10/31/2019 10/30/2019  WBC 4.0 - 10.5 K/uL 11.4(H) 14.0(H) 17.5(H)  Hemoglobin 12.0 - 15.0 g/dL 7.5(L) 8.7(L) 8.8(L)  Hematocrit 36 - 46 % 26.4(L) 30.3(L) 30.2(L)  Platelets 150 - 400 K/uL 181 309 284   CMP Latest Ref Rng & Units 11/03/2019 11/02/2019 11/01/2019  Glucose 70 - 99 mg/dL 145(H) 190(H) 247(H)    BUN 8 - 23 mg/dL 43(H) 43(H) 38(H)  Creatinine 0.44 - 1.00 mg/dL 0.71 0.86 0.82  Sodium 135 - 145 mmol/L 144 152(H) 154(H)  Potassium 3.5 - 5.1 mmol/L 3.6 3.4(L) 3.8  Chloride 98 - 111 mmol/L 106 113(H) 120(H)  CO2 22 - 32 mmol/L 29 31 29   Calcium 8.9 - 10.3 mg/dL 7.7(L) 7.6(L) 8.0(L)  Total Protein 6.5 - 8.1 g/dL 5.1(L) - -  Total Bilirubin 0.3 - 1.2 mg/dL 0.6 - -  Alkaline Phos 38 - 126 U/L 50 - -  AST 15 - 41 U/L 19 - -  ALT 0 - 44 U/L 13 - -      _______________________________________________________________  Assessment and Plan: POD 20 s/p Robotic 3 field esophagectomy.  Respiratory failure, cervical anastamotic leak.        Neuro: pain controlled CV: sinus, continue amio gtt for now. Off vasopressor support Pulm: will need diuresis prior to extubation.  Holding off on trach for now Contrast is well drained on cross-sectional imaging.  Will keep both until pt is  extubated. Renal: Hypernatremia improving. Continue diuresis. GI: Starting trickle tube feeds today. Heme: stable ID: wet to dry dressing to cervical incision. Antibiotics off today. Endo: SSI  Dispo: continue ICU care  Melodie Bouillon, MD 11/03/2019 12:29 PM

## 2019-11-03 NOTE — Progress Notes (Signed)
eLink Physician-Brief Progress Note Patient Name: [name] DOB: 10/31/1950 MRN: [id number]   Date of Service  11/03/2019  HPI/Events of Note  Patient with paroxysmal breakthrough Afib on an Amiodarone infusion.  eICU Interventions  Will check BMP, Mg+ level.        Kerry Kass Lyell Clugston 11/03/2019, 11:49 PM

## 2019-11-03 NOTE — Plan of Care (Signed)
  Problem: Clinical Measurements: Goal: Ability to maintain clinical measurements within normal limits will improve Outcome: Progressing Goal: Will remain free from infection Outcome: Progressing Goal: Diagnostic test results will improve Outcome: Progressing Goal: Cardiovascular complication will be avoided Outcome: Progressing   Problem: Activity: Goal: Risk for activity intolerance will decrease Outcome: Progressing   Problem: Nutrition: Goal: Adequate nutrition will be maintained Outcome: Progressing   Problem: Coping: Goal: Level of anxiety will decrease Outcome: Progressing   Problem: Elimination: Goal: Will not experience complications related to bowel motility Outcome: Progressing Goal: Will not experience complications related to urinary retention Outcome: Progressing   Problem: Pain Managment: Goal: General experience of comfort will improve Outcome: Progressing   Problem: Safety: Goal: Ability to remain free from injury will improve Outcome: Progressing   Problem: Skin Integrity: Goal: Risk for impaired skin integrity will decrease Outcome: Progressing

## 2019-11-03 NOTE — Progress Notes (Signed)
Occupational Therapy Treatment Patient Details Name: [name] MRN: [id number] DOB: 21-Jun-1950 Today's Date: 11/03/2019    History of present illness 69 yo with history of esophageal CA admitted for esophagectomy with jejunostomy placement Pt with respiratory distress on 10/14, transferred to ICU and intubated on 10/15 with one failed attempt to extubate. Pt with anastamotic leak, NGT placed 11/02/19. Additional PMHx: CoPD, Asthma, HTN, HLD, hypothyroidism, CAD   OT comments  Pt easily aroused for session, fatigues easily with neck AROM and trunk strengthening and B UE exercises. Pt on vent support via ETT, but communicating with head nod, thumbs up and waving. Pt with stable VS when positioned in chair position. Remained in reclined chair position at end of session. Updated d/c recommendation and goals given complicated medical course. Will continue to follow.  Follow Up Recommendations  CIR    Equipment Recommendations  Other (comment) (defer to next venue)    Recommendations for Other Services      Precautions / Restrictions Precautions Precautions: Fall;Other (comment) Precaution Comments: 2 chest tubes, j tube, ETT       Mobility Bed Mobility               General bed mobility comments: +2 total assist to pull up in bed prior to placing in chair position, min assist to right trunk in chair position, pt with tendency to lean R  Transfers                 General transfer comment: deferred    Balance Overall balance assessment: Needs assistance   Sitting balance-Leahy Scale: Poor Sitting balance - Comments: leans R                                   ADL either performed or assessed with clinical judgement   ADL       Grooming: Wash/dry face;Bed level;Maximal assistance                                       Vision       Perception     Praxis      Cognition Arousal/Alertness: Awake/alert Behavior During  Therapy: Flat affect Overall Cognitive Status: Difficult to assess                                 General Comments: ETT, but answering yes/no questions with head nod, gave thumbs up, waves         Exercises Exercises: Other exercises;General Upper Extremity General Exercises - Upper Extremity Shoulder Flexion: Both;AAROM;5 reps;Seated Elbow Flexion: Both;AAROM;5 reps;Seated Elbow Extension: AAROM;Both;5 reps;Seated Digit Composite Flexion: Both;AROM;Seated;5 reps Composite Extension: AROM;Both;5 reps;Seated Other Exercises Other Exercises: neck AROM    Shoulder Instructions       General Comments      Pertinent Vitals/ Pain       Pain Assessment: Faces Faces Pain Scale: Hurts a little bit Pain Location: generalized Pain Descriptors / Indicators: Sore Pain Intervention(s): Monitored during session;Repositioned  Home Living                                          Prior Functioning/Environment  Frequency  Min 2X/week        Progress Toward Goals  OT Goals(current goals can now be found in the care plan section)  Progress towards OT goals: Progressing toward goals  Acute Rehab OT Goals Patient Stated Goal: return home OT Goal Formulation: Patient unable to participate in goal setting Time For Goal Achievement: 11/09/19 Potential to Achieve Goals: Good  Plan      Co-evaluation    PT/OT/SLP Co-Evaluation/Treatment: Yes Reason for Co-Treatment: Complexity of the patient's impairments (multi-system involvement)   OT goals addressed during session: Strengthening/ROM;ADL's and self-care      AM-PAC OT "6 Clicks" Daily Activity     Outcome Measure   Help from another person eating meals?: Total Help from another person taking care of personal grooming?: Total Help from another person toileting, which includes using toliet, bedpan, or urinal?: Total Help from another person bathing (including washing,  rinsing, drying)?: Total Help from another person to put on and taking off regular upper body clothing?: Total Help from another person to put on and taking off regular lower body clothing?: Total 6 Click Score: 6    End of Session    OT Visit Diagnosis: Muscle weakness (generalized) (M62.81)   Activity Tolerance Patient tolerated treatment well   Patient Left in bed;with call bell/phone within reach (in semi reclined chair position)   Nurse Communication          Time: 2111-7356 OT Time Calculation (min): 32 min  Charges: OT General Charges $OT Visit: 1 Visit OT Treatments $Therapeutic Exercise: 8-22 mins  Nestor Lewandowsky, OTR/L Acute Rehabilitation Services Pager: [phone] Office: [phone]   Malka So 11/03/2019, 10:37 AM

## 2019-11-03 NOTE — Progress Notes (Signed)
NAME:  [name], MRN:  [id number], DOB:  [birth year], LOS: 11 ADMISSION DATE:  10/07/2019, CONSULTATION DATE: 10/20/2019 REFERRING MD:  Dr. Kipp Brood, CHIEF COMPLAINT:  SOB   Brief History   Rachel Lore. Flores is a 69 y/o female with a PMHx of esophageal squamous cell carcinoma admitted on 10/08 for esophagectomy with interval development of respiratory distress requiring BiPAP and transfer to ICU.   History of present illness   Mrs. [name] presented to [facility] for admission for elective esophagectomy on 10/08 that proceeded without any complications. She is currently POD 6.   Overnight, patient developed worsening work of breathing requiring NRB. She was noted to be diaphoretic with decreased lung sounds by the RN. Rapid response was called, who switched chest tube from water seal to suction. Air leak noted in [place]. CXR obtained overnight showed small right basilar pneumothorax with loculated pleural effusion, as well as interval development of extensive airspace infiltrate in the LUL. Repeat CXR this morning shows improvement in pneumothorax with no change in opacities. In the late morning, patient developed worsening respiratory distress requiring BiPAP placement and transfer to ICU for close monitoring.  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 Fib,   Significant Hospital Events   10/08 >> Admitted to [facility] 10/14 >> Transferred to ICU for respiratory distress   Consults:  PCCM  Procedures:  10/08 >> Esophagectomy 10/15 >> ETT placed, Bronchoscopy     Significant Diagnostic Tests:  CXR 10/13 >> Small right basilar pneumothorax, however, has increased in size since prior examination and there has now developed a small amount of laterally loculated pleural fluid. Extensive airspace infiltrate has now developed throughout the left upper lobe, likely infectious in the appropriate clinical setting.   CXR 10/14  >> Right basilar  pneumothorax is smaller compared to earlier in the day. No tension component. Subcutaneous air present on the right. There is extensive airspace opacity throughout the left upper lobe and left perihilar regions, similar to earlier in the day. There is patchy atelectasis in the right mid lung and right base with suspected loculated pleural effusion on the right laterally  ABD xray 10/20 > Mild diffuse air distension of the bowel, either reflecting ileus or low-grade obstruction.  Micro Data:  10/18 Pleural culture - moderate GNR 10/15 Respiratory culture - Pan-senstive PA Wound culture 10/18 >   Antimicrobials:  Zosyn 10/14 >> 10/21 Cefepime 10/21 > Flagyl 10/21 >  Interim history/subjective:  Good urine output. Remains on vent.   Objective   Blood pressure (!) 128/43, pulse 87, temperature 99.2 F (37.3 C), temperature source Axillary, resp. rate 20, height 5\' 2"  (1.575 m), weight 74.1 kg, SpO2 92 %.    Vent Mode: PRVC FiO2 (%):  [40 %-50 %] 40 % Set Rate:  [26 bmp] 26 bmp Vt Set:  [300 mL] 300 mL PEEP:  [8 cmH20] 8 cmH20 Plateau Pressure:  [11 cmH20-22 cmH20] 17 cmH20   Intake/Output Summary (Last 24 hours) at 11/03/2019 1059 Last data filed at 11/03/2019 0900 Gross per 24 hour  Intake 3197.45 ml  Output 3730 ml  Net -532.55 ml   Filed Weights   11/01/19 0246 11/02/19 0500 11/03/19 0500  Weight: 76.2 kg 77.4 kg 74.1 kg   Physical Exam: General: Chronically ill-appearing elderly female intubated on mechanical life support HEENT: Endotracheal tube in place Neuro: Sedated on mechanical ventilation CV: Regular rate rhythm, S1-S2 no MRG PULM: Bilateral mechanically ventilated breath sounds GI:  Soft nontender nondistended Extremities: no edema  Skin: no rash, bruising on lower abdomen.   Resolved Hospital Problem list   AKI  Assessment & Plan:   Acute hypoxemic and hypercarbic Respiratory Failure  -Reintubated on 10/17. Family agreeable to trach when team is ready.  However post-trach care poses a challenge due to the anastomotic leak. Discussed with CTS. For now, will keep patient intubated for now Hx of Asthma  P:  Continue vent support Lung protective ventilation Wean PEEP and FiO2 to maintain sats greater than 90% VAP prophylaxis Head of bed elevated greater than 30 degrees. Chest x-ray as needed SBT/SBT daily. PAD guidelines sedation with fentanyl as needed Precedex. Complete 14 days of antimicrobials, 7 days fluconazole, stop dates in place. Diuresis to maintain euvolemia.  Additional doses of Lasix plus metolazone today.  Sepsis with shock -Multiple sources including loculated pleural effusion after anastomotic leak from esophagectomy Loculated empyema secondary to anastomotic leak -S/p chest tube for source control -Culture positive for Enterobacter 10/18  Aspiration Pneumonia/Pseudomonas Aeruginosa HCAP Right Pneumothorax  -Resolved with tube thoracostomy P: Antimicrobials as above. Off vasopressors at this time continue to monitor.  Sinus Tachycardia Hx of Atrial Fibrillation  P: Full dose Lovenox  Esophageal Squamous Cell Carcinoma s/p Esophagectomy c/b anastomotic leak - Management of incision site per TCTS  P: Enteral access placed by cardiothoracic surgery Advancement of tube feeds per CT surgery  Early developing Ileus  -ABD xray 10/20 with mild diffuse air distension of the bowel, either reflecting ileus or low-grade obstruction. P: Tube feeds per CT surgery  Non-obstructive CAD LAD saccular aneurysm  HTN  P: Holding PTA Plavix and Imdur Continue aspirin Holding home BP meds  Hx of Hypothyroidism  P: Levothyroxine  High TG P: Off propofol  Chronic normocytic anemia P: Conservative transfusion threshold for hemoglobin less than 7   Best practice:  Diet: Tube Feeds per G Tube  Pain/Anxiety/Delirium protocol (if indicated): wean off VAP protocol (if indicated): Ordered  DVT prophylaxis: full dose  lovenox GI prophylaxis: Protonix Glucose control: SSI Mobility: up to chair Code Status: FULL Family Communication: Per primary.  Disposition: ICU  This patient is critically ill with multiple organ system failure; which, requires frequent high complexity decision making, assessment, support, evaluation, and titration of therapies. This was completed through the application of advanced monitoring technologies and extensive interpretation of multiple databases. During this encounter critical care time was devoted to patient care services described in this note for 32 minutes.  Garner Nash, DO [facility] 11/03/2019 11:00 AM

## 2019-11-03 NOTE — Progress Notes (Signed)
Spoke with pts daughter Joycelyn Schmid. Updated on pt status.  All questions answered.

## 2019-11-03 NOTE — Progress Notes (Signed)
PHARMACY - TOTAL PARENTERAL NUTRITION CONSULT NOTE  Indication: Intolerance to EN / Prolonged ileus  Patient Measurements: Height: 5\' 2"  (157.5 cm) Weight: 74.1 kg (163 lb 5.8 oz) IBW/kg (Calculated) : 50.1 TPN AdjBW (KG): 56.8 Body mass index is 29.88 kg/m.  Weight = 73 kg on admit  Assessment:  2 YOF presented on 10/12/2019 for bronch, esophagectomy and J-tube placement due to esophageal cancer.  Patient was started on EN post-op on 10/15/19 and tolerated goal rate.  She also passed her swallow evaluation and started on a CLD with nocturnal TF.  She unfortunately developed right PTX and subsequently require intubation, transfer to the ICU, and chest tube placement.  TF has been interrupted frequently since 10/24/19 due to drainage around J-tube and suspected ileus vs pSBO.  Pharmacy consulted for TPN management.  Glucose / Insulin: no hx DM, A1c 5.8% - CBGs improving off D5W Required 20 units SSI 10/26 + 25 units in TPN (was on Levemir 10/d while on TF with controlled CBGs) Electrolytes: Na down 144 (D5W at 58ml/hr 10/24 >> 10/27, FW 100 q6 added 10/27), K 3.6 post 1mEq x 2 doses, Mag 2.3 post 4gm, others WNL (Phos low normal) Renal:  LFTs / TGs: LFTs / tbili WNL, TG 168 Prealbumin / albumin: prealbumin < 5, albumin down 1.3 Intake / Output; MIVF: UOP 2 ml/kg/hr, CT O/P 44mL, net +15L (weight 73 > 77.1 kg) - Lasix 40mg  IV and metolazone 5mg  x1 on 10/27, KCL 81mEq x2 doses GI Imaging:  10/24 CT abd: ?focal enteritis, postoperative ileus Surgeries / Procedures: none since TPN  Central access: PICC placed 10/21/19 TPN start date: 10/30/19  Nutritional Goals (per RD rec on 10/25): 1600-1800 kCal, 100-120gm protein, >/= 1.5L fluid per day  Current Nutrition:  TPN Start Vital 1.5 at 20 ml/hr Start Prosource TID  Plan:  Continue TPN at goal rate 75 ml/hr, providing 113g AA and 1609 kCal, meeting 100% of needs Electrolytes in TPN:no Na, K 44mEq/L, Ca 72mEq/L, Mag 53mEq/L, incr Phos  slightly to 18 mmol/L, max acetate  Add standard MVI and trace elements to TPN Continue CVTS SSI Q4H + continue 25 units regular insulin in TPN (keep dose even though off D5W ; starting TF) PhosNAK packet PT BID x 2 doses FW 167mL Q6H per MD F/U AM labs, volume status, CBGs, TF advancement to taper TPN off (D/C when EN meets >/= 60% of needs per discussion with CVTS on 10/28)  Lasix 40mg  IV BID, metolazone 5mg  PT x1, KCL 39mEq Q6H x 3 doses per MD Will not increase K+ in TPN further to account for Lasix - supplement outside of TPN as needed  Havard Radigan [name], PharmD, BCPS, [place] 11/03/2019, 10:43 AM

## 2019-11-03 NOTE — Progress Notes (Signed)
Dr Kipp Brood rounding, notified that pts J-tube was leaking all around site - unsure if patient is absorbing anything from NGT or if it is leaking from site.  J-tube flushes okay. No orders at this time.

## 2019-11-03 NOTE — Progress Notes (Signed)
Pharmacy Antibiotic Note  [name] is a 69 y.o. female admitted on 10/17/2019 with sepsis.  Rising temp and growing Enterobacter cloacae + aerogenes  Pharmacy has been consulted for cefepime dosing.   Patient completed 5 days of metronidazole and 1 week of fluconazole. Per discussion with CCM and Dr. Kipp Brood, will continue cefepime through today to complete 2-weeks of Zosyn/Cefepime.  Patient remains afebrile. WBC trending down to 11.4 today. AKI resolved, Scr 0.71, CrCl 62.   Plan: Continue Cefepime to 2g IV q8 hrs through today Monitor renal function, clinical progression  Height: _0  (157.5 cm) Weight: 74.1 kg (163 lb 5.8 oz) IBW/kg (Calculated) : 50.1  Temp (24hrs), Avg:99 F (37.2 C), Min:98 F (36.7 C), Max:99.5 F (37.5 C)  Recent Labs  Lab 10/27/19 1600 10/27/19 1900 10/28/19 0357 10/28/19 0357 10/29/19 0339 10/30/19 0107 10/31/19 0400 11/01/19 0248 11/02/19 0400 11/03/19 0436  WBC  --   --  15.6*  --  13.2* 17.5* 14.0*  --   --  11.4*  CREATININE  --   --  0.91   < > 0.99 0.95 0.79 0.82 0.86  --   LATICACIDVEN 1.5 1.3  --   --   --   --   --   --   --   --    < > = values in this interval not displayed.    Estimated Creatinine Clearance: 57.4 mL/min (by C-G formula based on SCr of 0.86 mg/dL).    Allergies  Allergen Reactions  . Honey Bee Venom Protein [Bee Venom] Anaphylaxis  . Ether Nausea And Vomiting  . Nickel Other (See Comments)    infection  . Other Other (See Comments)    Pt reports that she cannot have staples placed due to infection.  . Tape Rash    Patient states that she has an allergic reaction to certain tapes. She states that she can tolerate paper tape.    Antimicrobials this admission: Zosyn 10/14>> 10/21 Cefepime 10/21>>(10/28) Flagyl 10/21>>10/25 Fluconazole 10/21>>10/27  Microbiology results: BCx 10/21: neg Pleural fluid 10/18: enterobacter cloacae + enterobacter aerogenes (S: cefepime) BAL cx 10/15: pan-sens  pseudomonas Body fluid from JP drain 10/15: cancelled (micro never received)  Richardine Service, PharmD PGY2 Cardiology Pharmacy Resident Phone: [phone] 11/03/2019  7:35 AM  Please check [email or web address] for unit-specific pharmacy phone numbers.

## 2019-11-03 NOTE — Progress Notes (Signed)
PT Progress Note  Notes: Pt is easily rouse on entry and agreeable to therapy. Limited to bed exercises at request of RN. Pt able to communicate with therapists with eye contact, head nods and pantomime. Pt placed in chair position and vitals remained stable so progressed to [facility] of all 4 extremities. Pt fatigues easily and placed in reclined chair position at end of session. Pt motivation and participation in presence of good family support at discharge make her a good candidate for CIR. PT will continue to follow acutely.    11/03/19 1047  PT Visit Information  Last PT Received On 11/03/19  Assistance Needed +2 (for EOB/OOB)  PT/OT/SLP Co-Evaluation/Treatment Yes  Reason for Co-Treatment Complexity of the patient's impairments (multi-system involvement)  PT goals addressed during session Strengthening/ROM  History of Present Illness 69 yo with history of esophageal CA admitted for esophagectomy with jejunostomy placement Pt with respiratory distress on 10/14, transferred to ICU and intubated on 10/15 with one failed attempt to extubate. Pt with anastamotic leak, NGT placed 11/02/19. Additional PMHx: CoPD, Asthma, HTN, HLD, hypothyroidism, CAD  Subjective Data  Patient Stated Goal return home  Precautions  Precautions Fall;Other (comment)  Precaution Comments 2 chest tubes, j tube, ETT  Restrictions  Weight Bearing Restrictions No  Pain Assessment  Pain Assessment Faces  Faces Pain Scale 2  Pain Location generalized  Pain Descriptors / Indicators Sore  Pain Intervention(s) Repositioned  Cognition  Arousal/Alertness Awake/alert  Behavior During Therapy Flat affect  Overall Cognitive Status Difficult to assess  General Comments ETT, but answering yes/no questions with head nod, gave thumbs up, waves   Difficult to assess due to Intubated  Bed Mobility  General bed mobility comments +2 total assist to pull up in bed prior to placing in chair position, min assist to right trunk in  chair position, pt with tendency to lean R  Transfers  General transfer comment deferred  Balance  Overall balance assessment Needs assistance  Sitting balance-Leahy Scale Poor  Sitting balance - Comments leans R  General Comments  General comments (skin integrity, edema, etc.) Vent settings 40%O2  PEEP 8, HR 40-80s  General Exercises - Lower Extremity  Ankle Circles/Pumps AAROM;Both;10 reps;Seated  [place];Both;10 reps;Seated  Hip ABduction/ADduction AAROM;Both;10 reps;Seated  Other Exercises  Other Exercises neck AROM   PT - End of Session  Equipment Utilized During Treatment Other (comment)  Activity Tolerance Patient limited by fatigue  Patient left with call bell/phone within reach;with bed alarm set (pt in chair position)  Nurse Communication Mobility status   PT - Assessment/Plan  PT Plan Discharge plan needs to be updated  PT Visit Diagnosis Other abnormalities of gait and mobility (R26.89);Difficulty in walking, not elsewhere classified (R26.2);Muscle weakness (generalized) (M62.81)  PT Frequency (ACUTE ONLY) Min 3X/week  Recommendations for Other Services Rehab consult  Follow Up Recommendations CIR;Supervision/Assistance - 24 hour  PT equipment Other (comment) (TBD)  AM-PAC PT "6 Clicks" Mobility Outcome Measure (Version 2)  Help needed turning from your back to your side while in a flat bed without using bedrails? 3  Help needed moving from lying on your back to sitting on the side of a flat bed without using bedrails? 1  Help needed moving to and from a bed to a chair (including a wheelchair)? 1  Help needed standing up from a chair using your arms (e.g., wheelchair or bedside chair)? 1  Help needed to walk in hospital room? 1  Help needed climbing 3-5 steps with a  railing?  1  6 Click Score 8  Consider Recommendation of Discharge To: CIR/SNF/LTACH  PT Goal Progression  Progress towards PT goals Progressing toward goals  Acute Rehab PT Goals  PT Goal  Formulation With patient  Time For Goal Achievement 11/11/19  Potential to Achieve Goals Good  PT Time Calculation  PT Start Time (ACUTE ONLY) 4621  PT Stop Time (ACUTE ONLY) 1010  PT Time Calculation (min) (ACUTE ONLY) 32 min  PT General Charges  $$ ACUTE PT VISIT 1 Visit  PT Treatments  $Therapeutic Exercise 8-22 mins   Katisha Shimizu B. Migdalia Dk PT, DPT Acute Rehabilitation Services Pager [phone] Office [phone]

## 2019-11-03 NOTE — Progress Notes (Signed)
Referring Physician(s): Dr Kipp Brood  Supervising Physician: Markus Daft  Patient Status:  [facility] - In-pt  Chief Complaint:  Rt pleural effusion Anterior Chest tube placement in IR 10/24/19  Subjective:  Intubated Little to no response Rt anterior chest tube placed in IR 10/18-  Intact No real change in total OP in pleurvac OP in tubing is purulent  CXR today:  IMPRESSION: Asymmetric pulmonary infiltrates much greater on LEFT, less at RIGHT lung base. Loculated pneumothorax at medial upper RIGHT hemithorax despite thoracostomy tubes  Allergies: Honey bee venom protein [bee venom], Ether, Nickel, Other, and Tape  Medications: Prior to Admission medications   Medication Sig Start Date End Date Taking? Authorizing Provider  Amino Acids-Protein Hydrolys (FEEDING SUPPLEMENT, PRO-STAT SUGAR FREE 64,) LIQD Place 30 mLs into feeding tube 2 (two) times daily. Patient taking differently: Place 30 mLs into feeding tube in the morning, at noon, in the evening, and at bedtime.  06/14/19  Yes Lavina Hamman, MD  aspirin EC 81 MG tablet Take 1 tablet (81 mg total) by mouth daily. 12/22/18  Yes Tobb, Kardie, DO  BREO ELLIPTA 100-25 MCG/INH AEPB Inhale 1 puff into the lungs at bedtime.  08/18/18  Yes [provider]  Calcium Carbonate-Vitamin D (CALCIUM 600/VITAMIN D PO) Take 1 tablet by mouth daily.   Yes [provider]  EPINEPHrine 0.3 mg/0.3 mL IJ SOAJ injection Inject 0.3 mg into the muscle as needed for anaphylaxis. 11/14/18  Yes [provider]  FLUoxetine (PROZAC) 20 MG capsule Take 20 mg by mouth daily. Pt currently not taking, as she feels she doesn't needs them at this time.   Yes [provider]  gabapentin (NEURONTIN) 300 MG capsule Take 300 mg by mouth 2 (two) times daily.  08/19/18  Yes [provider]  isosorbide mononitrate (IMDUR) 30 MG 24 hr tablet TAKE 1 TABLET BY MOUTH  DAILY Patient taking differently: Take 30 mg by mouth daily.   08/26/19  Yes Tobb, Kardie, DO  levothyroxine (SYNTHROID) 75 MCG tablet Take 75 mcg by mouth daily before breakfast.  08/23/18  Yes [provider]  metoprolol tartrate (LOPRESSOR) 25 MG tablet Take 0.5 tablets (12.5 mg total) by mouth 2 (two) times daily. 07/12/19 10/19/2019 Yes Tobb, Kardie, DO  mometasone (NASONEX) 50 MCG/ACT nasal spray Place 2 sprays into the nose daily as needed (pain).    Yes [provider]  nitroGLYCERIN (NITROSTAT) 0.4 MG SL tablet Place 1 tablet (0.4 mg total) under the tongue every 5 (five) minutes as needed for chest pain. 07/12/19 10/10/19 Yes Tobb, Kardie, DO  oxyCODONE (OXY IR/ROXICODONE) 5 MG immediate release tablet Take 10 mg by mouth every 4 (four) hours as needed for moderate pain.  09/27/19  Yes [provider]  pantoprazole (PROTONIX) 40 MG tablet Take 40 mg by mouth daily.  10/14/18  Yes [provider]  potassium chloride 20 MEQ/15ML (10%) SOLN Take 20 mEq by mouth daily. 06/22/19  Yes [provider]  promethazine (PHENERGAN) 25 MG tablet Take 25 mg by mouth every 8 (eight) hours as needed for nausea or vomiting.  06/28/19  Yes [provider]  rosuvastatin (CRESTOR) 40 MG tablet TAKE 1 TABLET BY MOUTH  DAILY Patient taking differently: Take 40 mg by mouth daily.  08/26/19  Yes Tobb, Kardie, DO  Water For Irrigation, Sterile (FREE WATER) SOLN Place 200 mLs into feeding tube every 8 (eight) hours. 06/14/19  Yes Lavina Hamman, MD  Ascorbic Acid (VITAMIN C) 100 MG  tablet Take 100 mg by mouth daily. Patient not taking: Reported on 10/03/2019    [provider]  oxyCODONE-acetaminophen (PERCOCET) 10-325 MG tablet Take 1 tablet by mouth every 4 (four) hours as needed for pain. Patient not taking: Reported on 10/03/2019    [provider]  rifaximin (XIFAXAN) 550 MG TABS tablet Take 550 mg by mouth See admin instructions. Take 1 tablet (550 mg) by mouth twice daily for 2 weeks, hold for 4 months then resume  cycle Patient not taking: Reported on 10/03/2019    [provider]  sucralfate (CARAFATE) 1 GM/10ML suspension Take 10 mLs (1 g total) by mouth 4 (four) times daily -  with meals and at bedtime. Patient not taking: Reported on 10/03/2019 06/14/19   Lavina Hamman, MD     Vital Signs: BP (!) 124/47   Pulse 78   Temp 99.5 F (37.5 C) (Oral)   Resp (!) 23   Ht 5\' 2"  (1.575 m)   Wt 163 lb 5.8 oz (74.1 kg)   SpO2 92%   BMI 29.88 kg/m   Physical Exam Vitals reviewed.  Skin:    General: Skin is warm.     Comments: Site of Rt anterior IR chest tube is clean and dry NT no bleeding No air leak in vac OP total is 300 cc--- no real change since I last saw OP (10/26)      Imaging: DG Chest Port 1 View  Result Date: 11/03/2019 CLINICAL DATA:  Intubation, chest tubes EXAM: PORTABLE CHEST 1 VIEW COMPARISON:  Portable exam 0531 hours compared to 10/30/2019 FINDINGS: Tip of endotracheal tube projects 3.1 cm above carina. Nasogastric tube extends into stomach. RIGHT jugular Port-A-Cath with tip projecting over SVC. LEFT arm PICC line, tip projecting over SVC. RIGHT thoracostomy tubes again seen with loculated pneumothorax at the medial upper RIGHT lung. Atherosclerotic calcification aorta. Normal heart size mediastinal contours. Extensive infiltrates throughout LEFT lung and mildly at RIGHT base. Small RIGHT pleural effusion. No acute osseous findings. IMPRESSION: Asymmetric pulmonary infiltrates much greater on LEFT, less at RIGHT lung base. Loculated pneumothorax at medial upper RIGHT hemithorax despite thoracostomy tubes. Aortic Atherosclerosis (ICD10-I70.0). Electronically Signed   By: Lavonia Dana M.D.   On: 11/03/2019 07:53    Labs:  CBC: Recent Labs    10/29/19 0339 10/30/19 0107 10/31/19 0400 11/03/19 0436  WBC 13.2* 17.5* 14.0* 11.4*  HGB 7.9* 8.8* 8.7* 7.5*  HCT 27.9* 30.2* 30.3* 26.4*  PLT 269 284 309 181    COAGS: Recent Labs    06/12/19 0020 06/12/19 0244  10/11/19 1000 10/23/2019 0653  INR 1.0  --  1.3*  --   APTT  --  30 >200* 29    BMP: Recent Labs    06/12/19 0610 06/12/19 0610 06/13/19 0329 06/13/19 0329 06/14/19 0401 06/14/19 0401 07/12/19 0854 10/11/19 1000 10/31/19 0400 11/01/19 0248 11/02/19 0400 11/03/19 0627  NA 133*   < > 139   < > 138  --  137   < > 156* 154* 152* 144  K 3.8   < > 3.6   < > 3.3*   < > 4.8   < > 3.5 3.8 3.4* 3.6  CL 101   < > 107   < > 106   < > 97   < > 123* 120* 113* 106  CO2 24   < > 23   < > 23   < > 25   < > 26 29 31  29  GLUCOSE 135*   < > 107*   < > 100*  --  137*   < > 161* 247* 190* 145*  BUN 18   < > 9   < > 9  --  15   < > 36* 38* 43* 43*  CALCIUM 8.0*   < > 8.2*   < > 8.1*   < > 9.6   < > 7.9* 8.0* 7.6* 7.7*  CREATININE 1.19*   < > 0.96   < > 0.89   < > 0.93   < > 0.79 0.82 0.86 0.71  GFRNONAA 47*   < > >60   < > >60   < > 63   < > >60 >60 >60 >60  GFRAA 54*  --  >60  --  >60  --  73  --   --   --   --   --    < > = values in this interval not displayed.    LIVER FUNCTION TESTS: Recent Labs    10/25/19 0333 10/30/19 0107 10/31/19 0400 11/03/19 0627  BILITOT 0.6 0.5 0.6 0.6  AST 16 23 17 19   ALT 14 13 12 13   ALKPHOS 60 49 54 50  PROT 5.8* 5.0* 5.0* 5.1*  ALBUMIN 2.4* 1.5* 1.4* 1.3*    Assessment and Plan:  Loculated pleural effusion Rt anterior IR chest tube Will discuss with Dr Anselm Pancoast--  Plan per TCTS  Electronically Signed: Lavonia Drafts, PA-C 11/03/2019, 2:32 PM   I spent a total of 15 Minutes at the the patient's bedside AND on the patient's hospital floor or unit, greater than 50% of which was counseling/coordinating care for right anterior chest tube

## 2019-11-03 NOTE — Progress Notes (Signed)
[place] for Eliquis > Lovenox Indication: atrial fibrillation  Allergies  Allergen Reactions  . Honey Bee Venom Protein [Bee Venom] Anaphylaxis  . Ether Nausea And Vomiting  . Nickel Other (See Comments)    infection  . Other Other (See Comments)    Pt reports that she cannot have staples placed due to infection.  . Tape Rash    Patient states that she has an allergic reaction to certain tapes. She states that she can tolerate paper tape.    Patient Measurements: Height: 5\' 2"  (157.5 cm) Weight: 74.1 kg (163 lb 5.8 oz) IBW/kg (Calculated) : 50.1  Vital Signs: Temp: 99.5 F (37.5 C) (10/28 1200) Temp Source: Oral (10/28 1200) BP: 128/49 (10/28 1200) Pulse Rate: 83 (10/28 1200)  Labs: Recent Labs    11/01/19 0248 11/02/19 0400 11/03/19 0436 11/03/19 0627  HGB  --   --  7.5*  --   HCT  --   --  26.4*  --   PLT  --   --  181  --   CREATININE 0.82 0.86  --  0.71    Estimated Creatinine Clearance: 61.7 mL/min (by C-G formula based on SCr of 0.71 mg/dL).   Medical History: Past Medical History:  Diagnosis Date  . Asthma   . Cancer ([place])    esophageal cancer  . COPD (chronic obstructive pulmonary disease) ([place])   . Coronary artery disease   . Depression   . Diabetes mellitus without complication ([place])    Type II  . Diastolic dysfunction   . Diverticulosis   . Fatty liver   . GERD without esophagitis   . Hx of Clostridium difficile infection   . Hx of colonic polyps   . Hx of small bowel obstruction   . Hypertension, benign   . Hypothyroidism   . Irritable bowel syndrome   . Left bundle branch block   . Lumbar herniated disc   . Mixed hyperlipidemia   . Osteoarthritis   . PAF (paroxysmal atrial fibrillation) ([place])    06/2019  . Pneumonia   . Sleep apnea    Uses O2 at bedtime on occasion    Assessment: 69 yo F s/p esophagectomy with history of atrial fibrillation. Was on Xarelto PTA. Patient was receiving  Eliquis since Xarelto cannot go down J tube (last Eliquis dose 10/24 ~2200), but J tube is no longer working. Patient was switched to Lovenox and NG tube placed 10/27.  LMWH level is supratherapeutic (1.19) on 80 mg BID. Hgb down from 8.7 on 10/25 to 7.5 today. Plts also down 309>181. No overt bleeding noted.   Goal of Therapy:  Anti-Xa (LMWH) peak level 0.6-1.0 units/mL Monitor platelets by anticoagulation protocol: Yes   Plan:   Decrease Enoxaparin to 60 mg Q12h Monitor CBC, s/sx bleeding, ability to switch back to [place], PharmD PGY2 Cardiology Pharmacy Resident Phone: [phone] 11/03/2019  12:31 PM  Please check [email or web address] for unit-specific pharmacy phone numbers.

## 2019-11-03 NOTE — Progress Notes (Signed)
Nutrition Follow-up  DOCUMENTATION CODES:   Non-severe (moderate) malnutrition in context of chronic illness  INTERVENTION:   Initiate trickle tube feeds via NG tube: - Vital 1.5 @ 20 ml/hr (480 ml/day) - Free water flushes per MD  Trickle tube feeding regimen provides 720 kcal, 32 grams of protein, and 367 ml of H2O (45% kcal needs, 32% protein needs).  RD will monitor for ability to advance tube feeds to goal regimen: - Vital 1.5 @ 45 ml/hr (1080 ml/day) - ProSource TF 45 ml TID  Goal tube feeding regimen would provide 1740 kcal, 106 grams of protein, and 825 ml of H2O (meeting 100% of needs).  - TPN per Pharmacy, recommend continuing TPN at goal rate until pt demonstrates tolerance of trickle tube feeds and starting to advance tube feeds to goal  NUTRITION DIAGNOSIS:   Moderate Malnutrition related to chronic illness (COPD, esophageal cancer s/p chemoradiation) as evidenced by moderate fat depletion, severe muscle depletion.  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 Presents this admission for surgical management of esophageal cancer.  10/09 - s/p esophagectomy, J-tube 10/12 - esophagram with no evidence of anastomotic leak, NGT removed, clear liquids 10/13 - tube feeds transitioned to nocturnal 10/14 - rapid response, CXR showing new right basilar pneumothorax and worsening airspace disease in LUL, chest tube placed back to suction 10/15 - intubated 10/17 - extubated 10/18 - reintubated, right-sided chest tube placed 10/19 - TF held due to drainage around J-tube  site 10/23 - TF held again 10/24 - TPN initiated 10/27 - EGD with NG tube placed (tip at pylorus)  Discussed pt with RN and during ICU rounds. MD placed NG tube at pylorus yesterday during EGD and ordered free water flushes of 100 ml q 6 hours to correct hypernatremia. Orders received today to start tube feeds via NG tube. Discussed with MD; will start at trickle rate and monitor for tolerance and ability to advance to goal regimen. Discussed plan with RN.  Per RN, pt continues to have copious green drainage from around J-tube site requiring frequent dressing changes. J-tube remains clamped.  TPN continues to infuse at goal rate of 75 ml/hr which provides 1609 kcal and 113 grams of protein (meets 100% of needs).  Admit weight: 73 kg Current weight: 74.1kg EDW: ~72 kg  Pt with +1 pitting edema to BUE and BLE.  Patient is currently intubated on ventilator support MV: 7.4 L/min Temp (24hrs), Avg:99.3 F (37.4 C), Min:99.2 F (37.3 C), Max:99.5 F (37.5 C) BP (cuff): 136/51 MAP (cuff): 74  Drips: TPN @ 75 ml/hr Amiodarone  Medications reviewed and include: colace, IV lasix 40 mg BID, SSI q 4 hours, IV reglan 10 mg q 6 hours, IV protonix, miralax, phos-nak 1 packet x 2, klor-con 40 mEq x 3, senna, IV abx  Labs reviewed: BUN 43, hemoglobin 7.5, potassium 3.6, phosphorus 2.5 CBG's: 152-188 x 24 hours  UOP: 3600 ml x 24 hours Rectal tube: 100 ml x 24 hours CT: 30 ml x 24 hours I/O's: +19.5 L since admit  Diet Order:   Diet Order  None      EDUCATION NEEDS:   Education needs have been addressed  Skin:  Skin Assessment: Skin Integrity Issues: Stage II: bilateral buttocks Incisions: chest x 2, abdomen x 2, neck  Last BM:  11/02/19 rectal tube  Height:   Ht Readings from Last 1 Encounters:  10/20/19 5' 2"  (1.575 m)    Weight:   Wt Readings from Last 1 Encounters:  11/03/19 74.1 kg    BMI:  Body mass index is 29.88 kg/m.  Estimated Nutritional Needs:    Kcal:  1600-1800  Protein:  100-120 gram  Fluid:  >/= 1.5 L    Gaynell Face, MS, RD, LDN Inpatient Clinical Dietitian Please see AMiON for contact information.

## 2019-11-04 ENCOUNTER — Inpatient Hospital Stay (HOSPITAL_COMMUNITY): Payer: Medicare Other

## 2019-11-04 DIAGNOSIS — A419 Sepsis, unspecified organism: Secondary | ICD-10-CM | POA: Diagnosis not present

## 2019-11-04 DIAGNOSIS — Z9911 Dependence on respirator [ventilator] status: Secondary | ICD-10-CM

## 2019-11-04 DIAGNOSIS — J9601 Acute respiratory failure with hypoxia: Secondary | ICD-10-CM | POA: Diagnosis not present

## 2019-11-04 DIAGNOSIS — C159 Malignant neoplasm of esophagus, unspecified: Secondary | ICD-10-CM | POA: Diagnosis not present

## 2019-11-04 LAB — BASIC METABOLIC PANEL
Anion gap: 8 (ref 5–15)
BUN: 44 mg/dL — ABNORMAL HIGH (ref 8–23)
CO2: 35 mmol/L — ABNORMAL HIGH (ref 22–32)
Calcium: 7.6 mg/dL — ABNORMAL LOW (ref 8.9–10.3)
Chloride: 97 mmol/L — ABNORMAL LOW (ref 98–111)
Creatinine, Ser: 0.77 mg/dL (ref 0.44–1.00)
GFR, Estimated: >60 mL/min (ref >60)
Glucose, Bld: 256 mg/dL — ABNORMAL HIGH (ref 70–99)
Potassium: 4 mmol/L (ref 3.5–5.1)
Sodium: 140 mmol/L (ref 135–145)

## 2019-11-04 LAB — GLUCOSE, CAPILLARY
Glucose-Capillary: 192 mg/dL — ABNORMAL HIGH (ref 70–99)
Glucose-Capillary: 209 mg/dL — ABNORMAL HIGH (ref 70–99)
Glucose-Capillary: 216 mg/dL — ABNORMAL HIGH (ref 70–99)
Glucose-Capillary: 218 mg/dL — ABNORMAL HIGH (ref 70–99)
Glucose-Capillary: 232 mg/dL — ABNORMAL HIGH (ref 70–99)
Glucose-Capillary: 239 mg/dL — ABNORMAL HIGH (ref 70–99)

## 2019-11-04 LAB — CBC
HCT: 28.2 % — ABNORMAL LOW (ref 36.0–46.0)
Hemoglobin: 8.3 g/dL — ABNORMAL LOW (ref 12.0–15.0)
MCH: 28.4 pg (ref 26.0–34.0)
MCHC: 29.4 g/dL — ABNORMAL LOW (ref 30.0–36.0)
MCV: 96.6 fL (ref 80.0–100.0)
Platelets: 226 10*3/uL (ref 150–400)
RBC: 2.92 MIL/uL — ABNORMAL LOW (ref 3.87–5.11)
RDW: 15.2 % (ref 11.5–15.5)
WBC: 16.5 10*3/uL — ABNORMAL HIGH (ref 4.0–10.5)
nRBC: 0.1 % (ref 0.0–0.2)

## 2019-11-04 LAB — PHOSPHORUS: Phosphorus: 3 mg/dL (ref 2.5–4.6)

## 2019-11-04 LAB — MAGNESIUM: Magnesium: 1.9 mg/dL (ref 1.7–2.4)

## 2019-11-04 MED ORDER — CHLORHEXIDINE GLUCONATE 0.12 % MT SOLN
OROMUCOSAL | Status: AC
  Administered 2019-11-04: 15 mL via OROMUCOSAL
  Filled 2019-11-04: qty 15

## 2019-11-04 MED ORDER — POTASSIUM CHLORIDE 20 MEQ PO PACK
40.0000 meq | PACK | Freq: Two times a day (BID) | ORAL | Status: AC
  Administered 2019-11-04 (×2): 40 meq
  Filled 2019-11-04 (×2): qty 2

## 2019-11-04 MED ORDER — TRAVASOL 10 % IV SOLN
INTRAVENOUS | Status: AC
  Filled 2019-11-04: qty 1134

## 2019-11-04 MED ORDER — INSULIN ASPART 100 UNIT/ML ~~LOC~~ SOLN
2.0000 [IU] | SUBCUTANEOUS | Status: DC
  Administered 2019-11-04 – 2019-11-05 (×7): 2 [IU] via SUBCUTANEOUS

## 2019-11-04 MED ORDER — INSULIN DETEMIR 100 UNIT/ML ~~LOC~~ SOLN
12.0000 [IU] | Freq: Two times a day (BID) | SUBCUTANEOUS | Status: DC
  Administered 2019-11-04 – 2019-11-06 (×5): 12 [IU] via SUBCUTANEOUS
  Filled 2019-11-04 (×7): qty 0.12

## 2019-11-04 MED ORDER — MAGNESIUM SULFATE 2 GM/50ML IV SOLN
2.0000 g | Freq: Once | INTRAVENOUS | Status: AC
  Administered 2019-11-04: 2 g via INTRAVENOUS
  Filled 2019-11-04: qty 50

## 2019-11-04 MED ORDER — AMIODARONE HCL 200 MG PO TABS
200.0000 mg | ORAL_TABLET | Freq: Two times a day (BID) | ORAL | Status: DC
  Administered 2019-11-04 (×2): 200 mg via ORAL
  Filled 2019-11-04 (×2): qty 1

## 2019-11-04 MED ORDER — SODIUM CHLORIDE 0.9 % IV SOLN
2.0000 g | Freq: Three times a day (TID) | INTRAVENOUS | Status: DC
  Administered 2019-11-04 – 2019-11-08 (×12): 2 g via INTRAVENOUS
  Filled 2019-11-04 (×12): qty 2

## 2019-11-04 MED ORDER — FREE WATER
50.0000 mL | Freq: Four times a day (QID) | Status: DC
  Administered 2019-11-04 – 2019-11-09 (×18): 50 mL

## 2019-11-04 NOTE — Progress Notes (Signed)
NAME:  BASMA BUCHNER, MRN:  [id number], DOB:  04-Jun-1950, LOS: 21 ADMISSION DATE:  10/16/2019, CONSULTATION DATE: 10/20/2019 REFERRING MD:  Dr. Kipp Brood, CHIEF COMPLAINT:  SOB   Brief History   [name]. Colclasure is a 69 y/o female with a PMHx of esophageal squamous cell carcinoma admitted on 10/08 for esophagectomy with interval development of respiratory distress requiring BiPAP and transfer to ICU.   History of present illness   Mrs. Ashlea presented to [facility] for admission for elective esophagectomy on 10/08 that proceeded without any complications. She is currently POD 6.   Overnight, patient developed worsening work of breathing requiring NRB. She was noted to be diaphoretic with decreased lung sounds by the RN. Rapid response was called, who switched chest tube from water seal to suction. Air leak noted in [place]. CXR obtained overnight showed small right basilar pneumothorax with loculated pleural effusion, as well as interval development of extensive airspace infiltrate in the LUL. Repeat CXR this morning shows improvement in pneumothorax with no change in opacities. In the late morning, patient developed worsening respiratory distress requiring BiPAP placement and transfer to ICU for close monitoring.  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 Fib,   Significant Hospital Events   10/08 >> Admitted to [facility] 10/14 >> Transferred to ICU for respiratory distress   Consults:  PCCM  Procedures:  10/08 >> Esophagectomy 10/15 >> ETT placed, Bronchoscopy     Significant Diagnostic Tests:  CXR 10/13 >> Small right basilar pneumothorax, however, has increased in size since prior examination and there has now developed a small amount of laterally loculated pleural fluid. Extensive airspace infiltrate has now developed throughout the left upper lobe, likely infectious in the appropriate clinical setting.   CXR 10/14  >> Right basilar  pneumothorax is smaller compared to earlier in the day. No tension component. Subcutaneous air present on the right. There is extensive airspace opacity throughout the left upper lobe and left perihilar regions, similar to earlier in the day. There is patchy atelectasis in the right mid lung and right base with suspected loculated pleural effusion on the right laterally  ABD xray 10/20 > Mild diffuse air distension of the bowel, either reflecting ileus or low-grade obstruction.  Micro Data:  10/18 Pleural culture - moderate GNR> MODERATE ENTEROBACTER CLOACAE  MODERATE ENTEROBACTER AEROGENES  FEW PSEUDOMONAS AERUGINOSA  10/15 Respiratory culture - Pan-senstive PA Wound culture 10/18 >  10/21 blood>    Antimicrobials:  Zosyn 10/14 >> 10/21 Cefepime 10/21 > Flagyl 10/21 >10/24 Fluconazole 10/22> 10/27  Interim history/subjective:  Back into Afib overnight, but rates have been dropping into 30s when sinus rhythm.  Objective   Blood pressure (!) 116/43, pulse 77, temperature 99.1 F (37.3 C), temperature source Oral, resp. rate (!) 22, height 5\' 2"  (1.575 m), weight 70.7 kg, SpO2 95 %.    Vent Mode: PRVC FiO2 (%):  [40 %-50 %] 50 % Set Rate:  [26 bmp] 26 bmp Vt Set:  [300 mL] 300 mL PEEP:  [8 cmH20] 8 cmH20 Plateau Pressure:  [16 cmH20-18 cmH20] 18 cmH20   Intake/Output Summary (Last 24 hours) at 11/04/2019 0824 Last data filed at 11/04/2019 0600 Gross per 24 hour  Intake 3120.63 ml  Output 4860 ml  Net -1739.37 ml   Net for admission +11.4L  Filed Weights   11/02/19 0500 11/03/19 0500 11/04/19 0500  Weight: 77.4 kg 74.1 kg 70.7 kg   Physical Exam: General: Critically  ill-appearing woman intubated, lightly sedated HEENT: [place]/AT, eyes anicteric.  ETT in place Neuro: RASS -1, following commands CV: Irregular rhythm, regular rate.  Periodic sinus bradycardia on telemetry. PULM: Rhonchi, left greater than right.  Occasional cuff leak, unclear if this is her mouth or  potentially from her left-sided neck dressing.  Purulent drainage from anterior small bore chest tube. GI: Abdomen soft, nontender.  Well-healed lower midline scar.  PEG tube with brown drainage, no significant cellulitis around stoma. Extremities: Dependent edema, no cyanosis Skin: No rashes.  No petechiae.  CXR 10/29 personally reviewed> improving left lung infiltrates. Right sided chest tube and mediastinal drain.  Resolved Hospital Problem list   AKI  Assessment & Plan:   Add long acting and TF insulin coverage Increasing TF, decreasing TPN Keep lasix, no metolazone today Switch to oral Amio> can switch back if into Afib with RVR abx done Reculture, restart antibiotics if increasing pressors or febrile con't lovenox> decreased dose last night   Acute hypoxemic and hypercarbic respiratory failure  -Reintubated on 10/17. Family agreeable to trach when team is ready. However post-trach care poses a challenge due to the anastomotic leak. Discussed with CTS. For now, will keep patient intubated for now Hx of Asthma  P: -Continue low tidal volume ventilation, 4 to 8 cc/kg ideal body weight goal plateau's and 30 driving pressure less than 15.  Meeting goals. -Daily SAT and SBT as appropriate.  Anticipate issues with leak. -VAP prevention protocol -Sedation with fentanyl as needed.  Can add back Precedex if needed, but anticipate worse issues with bradycardia.  Septic shock- pneumonia, empyema -Multiple sources including loculated pleural effusion after anastomotic leak from esophagectomy Loculated empyema secondary to anastomotic leak -S/p chest tube for source control -Culture positive for Enterobacter 10/18  Aspiration Pneumonia/Pseudomonas Aeruginosa HCAP Right Pneumothorax  P: -Restart cefepime given increasing WBC and persistent purulent drainage from chest tube. -vasopressors PRN to maintain MAP >65 -con't chest tube to suction -Cautious ongoing diuresis; continue Lasix  but hold additional doses of metolazone.  Sinus bradycardia Atrial fibrillation- rate controlled P: -transition to enteral amiodarone  -con't tele monitoring  Esophageal Squamous Cell Carcinoma s/p Esophagectomy c/b anastomotic leak - Management of incision site per TCTS  P: -Enteral access placed by cardiothoracic surgery -Advancement of tube feeds per CT surgery  Hyperglycemia -Adding Levemir 12 units twice daily and to feed coverage with hold parameters. -Sliding scale insulin as needed -Goal BG 140-180 while mated to the ICU.  Developing Ileus  -ABD xray 10/20 with mild diffuse air distension of the bowel, either reflecting ileus or low-grade obstruction. P: -TF; monitor for intolerance -con't reglan -Continue TPN; decrease as tube feed tolerance increases. -Continue bowel regimen  Non-obstructive CAD LAD saccular aneurysm  HTN  P: -Continue holding PTA Plavix, Imdur -Continue daily aspirin  Hx of Hypothyroidism  P: -Continue levothyroxine  High TG P: -Previously discontinued propofol, continue to hold.  Chronic normocytic anemia P: -Transfuse for hemoglobin less than 7 or hemodynamically significant bleeding   Best practice:  Diet: Tube Feeds per G Tube , TPN Pain/Anxiety/Delirium protocol (if indicated): wean off VAP protocol (if indicated): yes DVT prophylaxis: full dose lovenox GI prophylaxis: Protonix Glucose control:  Basal bolus insulin, SSI Mobility: up to chair Code Status: FULL Family Communication: Per primary.  Disposition: ICU  This patient is critically ill with multiple organ system failure; which, requires frequent high complexity decision making, assessment, support, evaluation, and titration of therapies. This was completed through the application of advanced monitoring  technologies and extensive interpretation of multiple databases. During this encounter critical care time was devoted to patient care services described in this note  for 40 minutes.  Julian Hy, DO [facility] 11/04/2019 10:55 AM

## 2019-11-04 NOTE — Progress Notes (Signed)
Pharmacy Antibiotic Note  [name] is a 69 y.o. female admitted on 10/22/2019 with sepsis.  Rising temp and growing Enterobacter cloacae + aerogenes in pleural fluid in addition to pseudomonas in BAL.  Antibiotics were stopped yesterday after 2 weeks of Zosyn/Cefepime, 1 week of fluconazole, and 5 days of Flagyl. She is afebrile but WBC trending up again to 16.5 and requiring pressors overnight. Pharmacy asked to restart cefepime.  Plan: Restart Cefepime 2g IV q8 hrs  Monitor renal function, clinical progression  Height: _0  (157.5 cm) Weight: 70.7 kg (155 lb 13.8 oz) IBW/kg (Calculated) : 50.1  Temp (24hrs), Avg:99.2 F (37.3 C), Min:98.6 F (37 C), Max:99.5 F (37.5 C)  Recent Labs  Lab 10/29/19 0339 10/29/19 0339 10/30/19 0107 10/30/19 0107 10/31/19 0400 11/01/19 0248 11/02/19 0400 11/03/19 0436 11/03/19 0627 11/03/19 2346 11/04/19 0430  WBC 13.2*  --  17.5*  --  14.0*  --   --  11.4*  --   --  16.5*  CREATININE 0.99   < > 0.95   < > 0.79 0.82 0.86  --  0.71 0.77  --    < > = values in this interval not displayed.    Estimated Creatinine Clearance: 60.2 mL/min (by C-G formula based on SCr of 0.77 mg/dL).    Allergies  Allergen Reactions  . Honey Bee Venom Protein [Bee Venom] Anaphylaxis  . Ether Nausea And Vomiting  . Nickel Other (See Comments)    infection  . Other Other (See Comments)    Pt reports that she cannot have staples placed due to infection.  . Tape Rash    Patient states that she has an allergic reaction to certain tapes. She states that she can tolerate paper tape.    Antimicrobials this admission: Zosyn 10/14>> 10/21 Cefepime 10/21>>10/28; restart 10/29>> Flagyl 10/21>>10/25 Fluconazole 10/21>>10/27  Microbiology results: BCx 10/21: neg Pleural fluid 10/18: enterobacter cloacae + enterobacter aerogenes (S: cefepime) BAL cx 10/15: pan-sens pseudomonas Body fluid from JP drain 10/15: cancelled (micro never received)  Richardine Service,  PharmD PGY2 Cardiology Pharmacy Resident Phone: [phone] 11/04/2019  9:15 AM  Please check [email or web address] for unit-specific pharmacy phone numbers.

## 2019-11-04 NOTE — Progress Notes (Signed)
CT Surgery   Resting comfortably on vent No new problems Vitals:   11/04/19 1730 11/04/19 1800  BP: (!) 119/42 (!) 131/49  Pulse: (!) 57 96  Resp: (!) 24 (!) 29  Temp:    SpO2: 95% 95%

## 2019-11-04 NOTE — Progress Notes (Signed)
PHARMACY - TOTAL PARENTERAL NUTRITION CONSULT NOTE  Indication: Intolerance to EN / Prolonged ileus  Patient Measurements: Height: 5\' 2"  (157.5 cm) Weight: 70.7 kg (155 lb 13.8 oz) IBW/kg (Calculated) : 50.1 TPN AdjBW (KG): 56.8 Body mass index is 28.51 kg/m.  Weight = 73 kg on admit  Assessment:  33 YOF presented on 10/15/2019 for bronch, esophagectomy and J-tube placement due to esophageal cancer.  Patient was started on EN post-op on 10/15/19 and tolerated goal rate.  She also passed her swallow evaluation and started on a CLD with nocturnal TF.  She unfortunately developed right PTX and subsequently require intubation, transfer to the ICU, and chest tube placement.  TF has been interrupted frequently since 10/24/19 due to drainage around J-tube and suspected ileus vs pSBO.  Pharmacy consulted for TPN management.  Glucose / Insulin: no hx DM, A1c 5.8% - CBGs elevated with TF started 10/28 Required 14 units SSI 10/26 + 25 units in TPN Electrolytes: Na down 140 (D5W at 68ml/hr 10/24 >> 10/27, FW 100 q6 added 10/27), K 4, Mag 1.9 (goal >/= 4 for ileus), CL low and high CO2 (max acetate in TPN)  - Lasix 40mg  IV BID, metolazone 5mg  x1 on 10/28 Renal: SCr 0.77, BUN 44  LFTs / TGs: LFTs / tbili WNL, TG 168 Prealbumin / albumin: prealbumin < 5, albumin down 1.3 Intake / Output; MIVF: UOP 2 ml/kg/hr, CT O/P 46mL, net +11L (down) GI Imaging:  10/24 CT abd: ?focal enteritis, postoperative ileus Surgeries / Procedures: none since TPN  Central access: PICC placed 10/21/19 TPN start date: 10/30/19  Nutritional Goals (per RD rec on 10/25): 1600-1800 kCal, 100-120gm protein, >/= 1.5L fluid per day  Current Nutrition:  TPN Start Vital 1.5 at 20 ml/hr = 720 kCal, 32g AA (leaking around Jtube site 10/28) - not advancing today Start Prosource TID (each provides 40 kCal, 11g AA)  Plan:  Continue TPN at goal rate 75 ml/hr, providing 113g AA and 1609 kCal, meeting 100% of needs Electrolytes in  TPN:no Na, K 37mEq/L (will not increase K+ in TPN further to account for Lasix - supplement outside of TPN as needed), Ca 25mEq/L, incr Mag 37mEq/L, Phos 18 mmol/L, max acetate  Add standard MVI and trace elements to TPN Continue CVTS SSI Q4H + continue 25 units regular insulin in TPN Add Levemir 12 units SQ BID and Novolog 2 units Q4H per MD - account for TF FW decreased to 68mL Q6H per MD Mag sulfate 2gm IV x 1 per MD KCL 90mEq PT x 2 doses to account for loss from Lasix F/U AM labs, volume status, CBGs, TF advancement to start tapering TPN  D/C TPN when EN meets >/= 60% of needs per discussion with CVTS on 10/28  [name] [name], PharmD, BCPS, [place] 11/04/2019, 10:54 AM

## 2019-11-04 NOTE — Progress Notes (Addendum)
[address]       [place] [address]             [phone]                 21 Days Post-Op Procedure(s) (LRB): XI ROBOTIC ASSISTED [place] ESOPHAGECTOMY USING NIMS (N/A) VIDEO BRONCHOSCOPY (N/A) JEJUNOSTOMY PLACEMENT (N/A) INTERCOSTAL NERVE BLOCK (Right)   Events: No events _______________________________________________________________ Vitals: BP (!) 138/46   Pulse 95   Temp 100.2 F (37.9 C) (Oral)   Resp (!) 31   Ht 5\' 2"  (1.575 m)   Wt 70.7 kg   SpO2 90%   BMI 28.51 kg/m   - Neuro: sedated. arousable  - Cardiovascular: afib  Drips: amio.  neo    - Pulm:  Vent Mode: PRVC FiO2 (%):  [40 %-50 %] 50 % Set Rate:  [26 bmp] 26 bmp Vt Set:  [300 mL] 300 mL PEEP:  [8 cmH20] 8 cmH20 Plateau Pressure:  [17 cmH20-18 cmH20] 18 cmH20  ABG    Component Value Date/Time   PHART 7.372 10/26/2019 0749   PCO2ART 54.4 (H) 10/26/2019 0749   PO2ART 105 10/26/2019 0749   HCO3 31.4 (H) 10/26/2019 0749   TCO2 33 (H) 10/26/2019 0749   ACIDBASEDEF 7.8 (H) 11/06/2019 2050   O2SAT 98.0 10/26/2019 0749    - Abd: soft.  J tube in place  - Extremity: warm  .Intake/Output      10/28 0701 - 10/29 0700 10/29 0701 - 10/30 0700   I.V. (mL/kg) 2139.5 (30.3) 768.2 (10.9)   Other 75    NG/GT 842.7 175   IV Piggyback 295.2 150   Total Intake(mL/kg) 3352.4 (47.4) 1093.2 (15.5)   Urine (mL/kg/hr) 4025 (2.4) 1100 (1.8)   Stool 825    Chest Tube 10    Total Output 4860 1100   Net -1507.6 -6.8        Urine Occurrence 2 x       _______________________________________________________________ Labs: CBC Latest Ref Rng & Units 11/04/2019 11/03/2019 10/31/2019  WBC 4.0 - 10.5 K/uL 16.5(H) 11.4(H) 14.0(H)  Hemoglobin 12.0 - 15.0 g/dL 8.3(L) 7.5(L) 8.7(L)  Hematocrit 36 - 46 % 28.2(L) 26.4(L) 30.3(L)  Platelets 150 - 400 K/uL 226 181 309   CMP Latest Ref Rng & Units 11/03/2019 11/03/2019 11/02/2019  Glucose 70 - 99 mg/dL 256(H) 145(H) 190(H)  BUN 8 - 23  mg/dL 44(H) 43(H) 43(H)  Creatinine 0.44 - 1.00 mg/dL 0.77 0.71 0.86  Sodium 135 - 145 mmol/L 140 144 152(H)  Potassium 3.5 - 5.1 mmol/L 4.0 3.6 3.4(L)  Chloride 98 - 111 mmol/L 97(L) 106 113(H)  CO2 22 - 32 mmol/L 35(H) 29 31  Calcium 8.9 - 10.3 mg/dL 7.6(L) 7.7(L) 7.6(L)  Total Protein 6.5 - 8.1 g/dL - 5.1(L) -  Total Bilirubin 0.3 - 1.2 mg/dL - 0.6 -  Alkaline Phos 38 - 126 U/L - 50 -  AST 15 - 41 U/L - 19 -  ALT 0 - 44 U/L - 13 -      _______________________________________________________________  Assessment and Plan: POD 21 s/p Robotic 3 field esophagectomy.  Respiratory failure, cervical anastamotic leak.        Neuro: pain controlled CV: sinus, continue amio gtt for now. Back on pressors Pulm: will need diuresis prior to extubation.  Holding off on trach for now  Will keep both chest tubes until pt is extubated. Renal:  Continue diuresis. GI: trickle tube feeds Heme: stable ID: wet  to dry dressing to cervical incision. Antibiotics off, WBC back up.  Low grade fevers Endo: SSI  Dispo: continue ICU care  Melodie Bouillon, MD

## 2019-11-05 ENCOUNTER — Inpatient Hospital Stay (HOSPITAL_COMMUNITY): Payer: Medicare Other

## 2019-11-05 DIAGNOSIS — J869 Pyothorax without fistula: Secondary | ICD-10-CM | POA: Diagnosis not present

## 2019-11-05 DIAGNOSIS — Z9911 Dependence on respirator [ventilator] status: Secondary | ICD-10-CM | POA: Diagnosis not present

## 2019-11-05 DIAGNOSIS — A419 Sepsis, unspecified organism: Secondary | ICD-10-CM | POA: Diagnosis not present

## 2019-11-05 DIAGNOSIS — K567 Ileus, unspecified: Secondary | ICD-10-CM

## 2019-11-05 DIAGNOSIS — J9601 Acute respiratory failure with hypoxia: Secondary | ICD-10-CM | POA: Diagnosis not present

## 2019-11-05 LAB — CBC
HCT: 27.7 % — ABNORMAL LOW (ref 36.0–46.0)
Hemoglobin: 8.3 g/dL — ABNORMAL LOW (ref 12.0–15.0)
MCH: 29.3 pg (ref 26.0–34.0)
MCHC: 30 g/dL (ref 30.0–36.0)
MCV: 97.9 fL (ref 80.0–100.0)
Platelets: 240 10*3/uL (ref 150–400)
RBC: 2.83 MIL/uL — ABNORMAL LOW (ref 3.87–5.11)
RDW: 15.3 % (ref 11.5–15.5)
WBC: 15.3 10*3/uL — ABNORMAL HIGH (ref 4.0–10.5)
nRBC: 0.1 % (ref 0.0–0.2)

## 2019-11-05 LAB — BASIC METABOLIC PANEL
Anion gap: 10 (ref 5–15)
BUN: 46 mg/dL — ABNORMAL HIGH (ref 8–23)
CO2: 38 mmol/L — ABNORMAL HIGH (ref 22–32)
Calcium: 7.6 mg/dL — ABNORMAL LOW (ref 8.9–10.3)
Chloride: 91 mmol/L — ABNORMAL LOW (ref 98–111)
Creatinine, Ser: 0.75 mg/dL (ref 0.44–1.00)
GFR, Estimated: >60 mL/min (ref >60)
Glucose, Bld: 185 mg/dL — ABNORMAL HIGH (ref 70–99)
Potassium: 4.2 mmol/L (ref 3.5–5.1)
Sodium: 139 mmol/L (ref 135–145)

## 2019-11-05 LAB — GLUCOSE, CAPILLARY
Glucose-Capillary: 172 mg/dL — ABNORMAL HIGH (ref 70–99)
Glucose-Capillary: 173 mg/dL — ABNORMAL HIGH (ref 70–99)
Glucose-Capillary: 176 mg/dL — ABNORMAL HIGH (ref 70–99)
Glucose-Capillary: 177 mg/dL — ABNORMAL HIGH (ref 70–99)
Glucose-Capillary: 187 mg/dL — ABNORMAL HIGH (ref 70–99)
Glucose-Capillary: 195 mg/dL — ABNORMAL HIGH (ref 70–99)

## 2019-11-05 LAB — HEPARIN ANTI-XA: Heparin LMW: 0.49 IU/mL

## 2019-11-05 LAB — MAGNESIUM: Magnesium: 2.3 mg/dL (ref 1.7–2.4)

## 2019-11-05 MED ORDER — PROSOURCE TF PO LIQD
45.0000 mL | Freq: Two times a day (BID) | ORAL | Status: DC
  Administered 2019-11-05 – 2019-11-07 (×5): 45 mL
  Filled 2019-11-05 (×5): qty 45

## 2019-11-05 MED ORDER — TRAVASOL 10 % IV SOLN
INTRAVENOUS | Status: AC
  Filled 2019-11-05: qty 1134

## 2019-11-05 MED ORDER — VITAL HIGH PROTEIN PO LIQD
1000.0000 mL | ORAL | Status: DC
  Administered 2019-11-05 – 2019-11-06 (×3): 1000 mL

## 2019-11-05 MED ORDER — AMIODARONE HCL 200 MG PO TABS
200.0000 mg | ORAL_TABLET | Freq: Two times a day (BID) | ORAL | Status: DC
  Administered 2019-11-05 – 2019-11-06 (×3): 200 mg
  Filled 2019-11-05 (×3): qty 1

## 2019-11-05 MED ORDER — INSULIN ASPART 100 UNIT/ML ~~LOC~~ SOLN
3.0000 [IU] | SUBCUTANEOUS | Status: DC
  Administered 2019-11-05 – 2019-11-07 (×11): 3 [IU] via SUBCUTANEOUS

## 2019-11-05 MED ORDER — FUROSEMIDE 10 MG/ML IJ SOLN
40.0000 mg | Freq: Three times a day (TID) | INTRAMUSCULAR | Status: DC
  Administered 2019-11-05 – 2019-11-07 (×6): 40 mg via INTRAVENOUS
  Filled 2019-11-05 (×6): qty 4

## 2019-11-05 NOTE — Progress Notes (Signed)
[place] for lovenox Indication: atrial fibrillation  Allergies  Allergen Reactions  . Honey Bee Venom Protein [Bee Venom] Anaphylaxis  . Ether Nausea And Vomiting  . Nickel Other (See Comments)    infection  . Other Other (See Comments)    Pt reports that she cannot have staples placed due to infection.  . Tape Rash    Patient states that she has an allergic reaction to certain tapes. She states that she can tolerate paper tape.    Patient Measurements: Height: 5\' 2"  (157.5 cm) Weight: 69 kg (152 lb 1.9 oz) IBW/kg (Calculated) : 50.1  Vital Signs: Temp: 98.2 F (36.8 C) (10/30 1551) BP: 131/44 (10/30 1600) Pulse Rate: 99 (10/30 1600)  Labs: Recent Labs    11/03/19 0436 11/03/19 0436 11/03/19 0627 11/03/19 1426 11/03/19 2346 11/04/19 0430 11/05/19 0424 11/05/19 1405  HGB 7.5*   < >  --   --   --  8.3* 8.3*  --   HCT 26.4*  --   --   --   --  28.2* 27.7*  --   PLT 181  --   --   --   --  226 240  --   HEPRLOWMOCWT  --   --   --  1.19  --   --   --  0.49  CREATININE  --   --  0.71  --  0.77  --  0.75  --    < > = values in this interval not displayed.    Estimated Creatinine Clearance: 59.6 mL/min (by C-G formula based on SCr of 0.75 mg/dL).   Medical History: Past Medical History:  Diagnosis Date  . Asthma   . Cancer ([place])    esophageal cancer  . COPD (chronic obstructive pulmonary disease) ([place])   . Coronary artery disease   . Depression   . Diabetes mellitus without complication ([place])    Type II  . Diastolic dysfunction   . Diverticulosis   . Fatty liver   . GERD without esophagitis   . Hx of Clostridium difficile infection   . Hx of colonic polyps   . Hx of small bowel obstruction   . Hypertension, benign   . Hypothyroidism   . Irritable bowel syndrome   . Left bundle branch block   . Lumbar herniated disc   . Mixed hyperlipidemia   . Osteoarthritis   . PAF (paroxysmal atrial fibrillation) ([place])     06/2019  . Pneumonia   . Sleep apnea    Uses O2 at bedtime on occasion    Assessment: 69 yo F s/p esophagectomy with history of atrial fibrillation. Was on Xarelto PTA. Patient was receiving Eliquis since Xarelto cannot go down J tube (last Eliquis dose 10/24 ~2200), but J tube is no longer working. Patient was switched to Lovenox and NG tube placed 10/27.  LMWH level was drawn late, 6 hr after dose. Level is 0.49. True level likely therapeutic. H/H, plt stable.  Goal of Therapy:  Anti-Xa (LMWH) peak level 0.6-1.0 units/mL Monitor platelets by anticoagulation protocol: Yes   Plan:   Continue Enoxaparin to 60 mg Q12h Monitor CBC, s/sx bleeding F/u ability to switch back to [place], PharmD, BCPS, BCCP Clinical Pharmacist  Please check AMION for all [place] phone numbers After 10:00 PM, call [place]

## 2019-11-05 NOTE — Progress Notes (Signed)
PEG/J tube site dsg changed.  Copious thick green secretions noted around reddened skin.  Same thick green fluid coming out of peg/j tube site when abd palpated.  Site cleaned well with water and dried throughly.  Drytex dressing cut into pieces placed in between sutures to surround tube, covered with gauze, and ABD pad secured to abdomen.   Wound care consult placed.

## 2019-11-05 NOTE — Progress Notes (Signed)
NAME:  [name], MRN:  [id number], DOB:  29-Nov-1950, LOS: 22 ADMISSION DATE:  10/12/2019, CONSULTATION DATE: 10/20/2019 REFERRING MD:  Dr. Kipp Brood, CHIEF COMPLAINT:  SOB   Brief History   [name] is a 69 y/o female with a PMHx of esophageal squamous cell carcinoma admitted on 10/08 for esophagectomy with interval development of respiratory distress requiring BiPAP and transfer to ICU.   History of present illness   [name] presented to [facility] for admission for elective esophagectomy on 10/08 that proceeded without any complications. She is currently POD 6.   Overnight, patient developed worsening work of breathing requiring NRB. She was noted to be diaphoretic with decreased lung sounds by the RN. Rapid response was called, who switched chest tube from water seal to suction. Air leak noted in [place]. CXR obtained overnight showed small right basilar pneumothorax with loculated pleural effusion, as well as interval development of extensive airspace infiltrate in the LUL. Repeat CXR this morning shows improvement in pneumothorax with no change in opacities. In the late morning, patient developed worsening respiratory distress requiring BiPAP placement and transfer to ICU for close monitoring.  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 Fib,   Significant Hospital Events   10/08 >> Admitted [facility] 10/14 >> Transferred to ICU for respiratory distress   Consults:  PCCM  Procedures:  10/08 >> Esophagectomy 10/15 >> ETT placed, Bronchoscopy     Significant Diagnostic Tests:  CXR 10/13 >> Small right basilar pneumothorax, however, has increased in size since prior examination and there has now developed a small amount of laterally loculated pleural fluid. Extensive airspace infiltrate has now developed throughout the left upper lobe, likely infectious in the appropriate clinical setting.   CXR 10/14  >> Right basilar  pneumothorax is smaller compared to earlier in the day. No tension component. Subcutaneous air present on the right. There is extensive airspace opacity throughout the left upper lobe and left perihilar regions, similar to earlier in the day. There is patchy atelectasis in the right mid lung and right base with suspected loculated pleural effusion on the right laterally  ABD xray 10/20 > Mild diffuse air distension of the bowel, either reflecting ileus or low-grade obstruction.  Micro Data:  10/18 Pleural culture - moderate GNR> MODERATE ENTEROBACTER CLOACAE  MODERATE ENTEROBACTER AEROGENES  FEW PSEUDOMONAS AERUGINOSA  10/15 Respiratory culture - Pan-senstive PA Wound culture 10/18 >  10/21 blood>    Antimicrobials:  Zosyn 10/14 >> 10/21 Cefepime 10/21 >10/28, 10/29> Flagyl 10/21 >10/24 Fluconazole 10/22> 10/27  Interim history/subjective:  Febrile overnight. No uncontrolled tachycardia.  Objective   Blood pressure (!) 105/42, pulse (!) 55, temperature 98.1 F (36.7 C), resp. rate (!) 24, height 5\' 2"  (1.575 m), weight 69 kg, SpO2 (!) 89 %.    Vent Mode: PRVC FiO2 (%):  [40 %-50 %] 40 % Set Rate:  [26 bmp] 26 bmp Vt Set:  [300 mL] 300 mL PEEP:  [8 cmH20] 8 cmH20 Plateau Pressure:  [18 cmH20] 18 cmH20   Intake/Output Summary (Last 24 hours) at 11/05/2019 1105 Last data filed at 11/05/2019 1000 Gross per 24 hour  Intake 3563.94 ml  Output 3814 ml  Net -250.06 ml   Net for admission +9.8L  Filed Weights   11/03/19 0500 11/04/19 0500 11/05/19 0433  Weight: 74.1 kg 70.7 kg 69 kg   Physical Exam: General: Critically ill-appearing woman lying in bed intubated, sedated HEENT: [place]/AT, eyes  anicteric, ETT in place.  Oral mucosa moist. Neck: Left surgical neck were not examined Neuro: RASS -4, not following commands on exam.  Tracking once awake. CV: RRR, no murmur PULM: Breathing comfortably mechanical ventilation, less rhonchi today.  Minimal chest tube output, remains  purulent and anterior chest tube. GI: Abdomen soft, nontender, nondistended.  Healing lower abdominal scar.  Left-sided J-tube. Extremities: Dependent edema, no cyanosis or clubbing Skin: No rashes, no petechiae  CXR 10/29 personally reviewed> worsening left lung infiltrate, air bronchograms, left hemidiaphragm silhouetted.  Right basilar atelectasis, right lateral hemithorax opacity persists with chest tube in place.  Medial apical pneumothorax.  Resolved Hospital Problem list   AKI  Assessment & Plan:   Acute hypoxemic and hypercarbic respiratory failure  -Reintubated on 10/17. Family agreeable to trach when team is ready. However post-trach care poses a challenge due to the anastomotic leak. Left lung infiltrate persistent since 10/14. Hx of Asthma  P: -Continue low tidal volume ventilation, 4 to 8 cc/kg ideal body weight with goal plateau less than 30 and driving pressure less than 15.  Currently meeting goals. -Daily SAT and SBT.  Needs more aggressive diuresis prior to extubation. -VAP prevention protocol -Trach aspirate -Sedation is required to tolerate mechanical ventilation.  Septic shock- pneumonia, empyema -Multiple sources including loculated pleural effusion after anastomotic leak from esophagectomy Loculated empyema secondary to anastomotic leak -S/p chest tube for source control -Culture positive for Enterobacter 10/18  Aspiration Pneumonia/Pseudomonas Aeruginosa HCAP Right Pneumothorax  P: -Continue cefepime, was started on 10/29 for worsening leukocytosis with ongoing purulent drainage from chest tube. -Continue vasopressors PRN to maintain MAP >65 -Continue chest tubes to suction; these remain in place until extubated -Ongoing diuresis  Sinus bradycardia Atrial fibrillation- rate controlled P: -Continue enteral amiodarone, transitioned off drip 10/29 -Continue telemetry  Esophageal Squamous Cell Carcinoma s/p Esophagectomy c/b anastomotic leak - Management  of incision site per TCTS  P: -Enteral access placed by cardiothoracic surgery -Advancement of tube feeds per CT surgery   Hyperglycemia- controlled -Continue Levemir 12 units twice daily and TF coverage with hold parameters. -Sliding scale insulin as needed -Goal BG 140-180 while admitted to the ICU.  Developing Ileus  -ABD xray 10/20 with mild diffuse air distension of the bowel, either reflecting ileus or low-grade obstruction. P: -TF; monitor for intolerance -con't reglan -Continue TPN; decrease as tube feed tolerance improves. Increase TF to 30cc/h as tolerated. -Continue bowel regimen  Non-obstructive CAD LAD saccular aneurysm  HTN  P: -Continue holding PTA Plavix; holding Imdur due to hypotension -Continue daily aspirin  Hx of Hypothyroidism  P: -Continue levothyroxine  High TG, likely due to TPN and propofol P: -Previously discontinued propofol, continue to hold.  Chronic normocytic anemia-stable P: -Transfuse for hemoglobin less than 7 or hemodynamically significant bleeding   Best practice:  Diet: Tube Feeds per G Tube , TPN Pain/Anxiety/Delirium protocol (if indicated): wean off VAP protocol (if indicated): yes DVT prophylaxis: full dose lovenox GI prophylaxis: Protonix Glucose control:  Basal bolus insulin, SSI Mobility: up to chair Code Status: FULL Family Communication: Per primary.  Disposition: ICU  This patient is critically ill with multiple organ system failure; which, requires frequent high complexity decision making, assessment, support, evaluation, and titration of therapies. This was completed through the application of advanced monitoring technologies and extensive interpretation of multiple databases. During this encounter critical care time was devoted to patient care services described in this note for 39 minutes.  Julian Hy, DO [place] [facility] 11/05/2019  11:05 AM

## 2019-11-05 NOTE — Progress Notes (Addendum)
22 Days Post-Op Procedure(s) (LRB): XI ROBOTIC ASSISTED [place] ESOPHAGECTOMY USING NIMS (N/A) VIDEO BRONCHOSCOPY (N/A) JEJUNOSTOMY PLACEMENT (N/A) INTERCOSTAL NERVE BLOCK (Right) Subjective: 69yo s/p esophagectomy Leak L neck Enterobacter pneumonia on vent with t max 100.9 on maxipime Tube feeds at goal Low dose norepi for BP support on sedation Objective: Vital signs in last 24 hours: Temp:  [98.1 F (36.7 C)-100.9 F (38.3 C)] 98.1 F (36.7 C) (10/30 0749) Pulse Rate:  [39-134] 98 (10/30 0900) Cardiac Rhythm: Atrial fibrillation;Sinus tachycardia (10/30 0730) Resp:  [17-32] 22 (10/30 0900) BP: (80-145)/(30-73) 137/48 (10/30 0900) SpO2:  [81 %-99 %] 90 % (10/30 0900) FiO2 (%):  [40 %-50 %] 40 % (10/30 0746) Weight:  [69 kg] 69 kg (10/30 0433)  Hemodynamic parameters for last 24 hours:  nsr  Intake/Output from previous day: 10/29 0701 - 10/30 0700 In: 3257.6 [I.V.:1992.5; NG/GT:915; IV Piggyback:350.1] Out: 3164 [Urine:2950; Stool:200; Chest Tube:14] Intake/Output this shift: Total I/O In: 317.6 [I.V.:157.6; NG/GT:160] Out: 650 [Urine:650]  sedated on vent Coarse breath sounds nsr Lab Results: Recent Labs    11/04/19 0430 11/05/19 0424  WBC 16.5* 15.3*  HGB 8.3* 8.3*  HCT 28.2* 27.7*  PLT 226 240   BMET:  Recent Labs    11/03/19 2346 11/05/19 0424  NA 140 139  K 4.0 4.2  CL 97* 91*  CO2 35* 38*  GLUCOSE 256* 185*  BUN 44* 46*  CREATININE 0.77 0.75  CALCIUM 7.6* 7.6*    PT/INR: No results for input(s): LABPROT, INR in the last 72 hours. ABG    Component Value Date/Time   PHART 7.372 10/26/2019 0749   HCO3 31.4 (H) 10/26/2019 0749   TCO2 33 (H) 10/26/2019 0749   ACIDBASEDEF 7.8 (H) 11/01/2019 2050   O2SAT 98.0 10/26/2019 0749   CBG (last 3)  Recent Labs    11/05/19 0015 11/05/19 0419 11/05/19 0742  GLUCAP 173* 177* 172*    Assessment/Plan: S/P Procedure(s) (LRB): XI ROBOTIC ASSISTED [place] ESOPHAGECTOMY USING NIMS (N/A) VIDEO  BRONCHOSCOPY (N/A) JEJUNOSTOMY PLACEMENT (N/A) INTERCOSTAL NERVE BLOCK (Right) Cont current care Trach when neck wound is improved- may benefit from wound VAC   LOS: 22 days    Tharon Aquas Trigt III 11/05/2019

## 2019-11-05 NOTE — Progress Notes (Signed)
PHARMACY - TOTAL PARENTERAL NUTRITION CONSULT NOTE  Indication: Intolerance to EN / Prolonged ileus  Patient Measurements: Height: 5\' 2"  (157.5 cm) Weight: 69 kg (152 lb 1.9 oz) IBW/kg (Calculated) : 50.1 TPN AdjBW (KG): 56.8 Body mass index is 27.82 kg/m.  Weight = 73 kg on admit  Assessment:  63 YOF presented on 10/31/2019 for bronch, esophagectomy and J-tube placement due to esophageal cancer.  Patient was started on EN post-op on 10/15/19 and tolerated goal rate.  She also passed her swallow evaluation and started on a CLD with nocturnal TF.  She unfortunately developed right PTX and subsequently require intubation, transfer to the ICU, and chest tube placement.  TF has been interrupted frequently since 10/24/19 due to drainage around J-tube and suspected ileus vs pSBO.  Pharmacy consulted for TPN management.  Glucose / Insulin: no hx DM, A1c 5.8% - CBGs/24h 172-218 (sl improvement) with TF started 10/28, Levemir 12 units bid + 2 units q4h added 10/29 Required 40 units SSI 10/29 + 25 units in TPN Electrolytes: Na down 139 (D5W at 33ml/hr 10/24 >> 10/27, FW 50 q6 added 10/27, decr 10/29), K 4.2, Mag 2.3 (goal >/= 4 for ileus), CL low and high CO2 (max acetate in TPN)  - Lasix 40mg  IV BID, metolazone 5mg  x1 on 10/28 Renal: SCr 0.75, BUN 46  LFTs / TGs: LFTs / tbili WNL, TG 168 Prealbumin / albumin: prealbumin < 5, albumin down 1.3 Intake / Output; MIVF: UOP 1.8 ml/kg/hr, CT O/P 32mL, NGT O/P 835mL, net +9.7L (down) GI Imaging:  10/24 CT abd: ?focal enteritis, postoperative ileus Surgeries / Procedures: none since TPN  Central access: PICC placed 10/21/19 TPN start date: 10/30/19  Nutritional Goals (per RD rec on 10/25): 1600-1800 kCal, 100-120gm protein, >/= 1.5L fluid per day  Current Nutrition:  TPN Start Vital 1.5 at 20 ml/hr = 720 kCal, 32g AA (leaking around Jtube site 10/28) - rate has not been advanced  Plan:  Continue TPN at goal rate 75 ml/hr, providing 113g AA and 1609  kCal, meeting 100% of needs Electrolytes in TPN:no Na, K 70mEq/L (will not increase K+ in TPN further to account for Lasix - supplement outside of TPN as needed), Ca 74mEq/L, incr Mag 59mEq/L, Phos 18 mmol/L, max acetate  Add standard MVI and trace elements to TPN Continue CVTS SSI Q4H + continue 25 units regular insulin in TPN Continue Levemir 12 units SQ BID and Novolog 2 units Q4H per MD - account for TF FW decreased to 55mL Q6H per MD F/U AM labs, volume status, CBGs, TF advancement to start tapering TPN  D/C TPN when EN meets >/= 60% of needs per discussion with CVTS on 10/28  Thank you for involving pharmacy in this patient's care.  Renold Genta, PharmD, BCPS Clinical Pharmacist Clinical phone for 11/05/2019 until 3p is [phone] 11/05/2019 7:16 AM  **Pharmacist phone directory can be found on [email or web address] listed under [place]**

## 2019-11-06 ENCOUNTER — Inpatient Hospital Stay (HOSPITAL_COMMUNITY): Payer: Medicare Other

## 2019-11-06 DIAGNOSIS — Z9911 Dependence on respirator [ventilator] status: Secondary | ICD-10-CM | POA: Diagnosis not present

## 2019-11-06 DIAGNOSIS — J9601 Acute respiratory failure with hypoxia: Secondary | ICD-10-CM | POA: Diagnosis not present

## 2019-11-06 DIAGNOSIS — R739 Hyperglycemia, unspecified: Secondary | ICD-10-CM | POA: Diagnosis not present

## 2019-11-06 LAB — CBC
HCT: 26 % — ABNORMAL LOW (ref 36.0–46.0)
Hemoglobin: 7.6 g/dL — ABNORMAL LOW (ref 12.0–15.0)
MCH: 28.9 pg (ref 26.0–34.0)
MCHC: 29.2 g/dL — ABNORMAL LOW (ref 30.0–36.0)
MCV: 98.9 fL (ref 80.0–100.0)
Platelets: 240 10*3/uL (ref 150–400)
RBC: 2.63 MIL/uL — ABNORMAL LOW (ref 3.87–5.11)
RDW: 15.7 % — ABNORMAL HIGH (ref 11.5–15.5)
WBC: 18.1 10*3/uL — ABNORMAL HIGH (ref 4.0–10.5)
nRBC: 0.1 % (ref 0.0–0.2)

## 2019-11-06 LAB — GLUCOSE, CAPILLARY
Glucose-Capillary: 171 mg/dL — ABNORMAL HIGH (ref 70–99)
Glucose-Capillary: 174 mg/dL — ABNORMAL HIGH (ref 70–99)
Glucose-Capillary: 176 mg/dL — ABNORMAL HIGH (ref 70–99)
Glucose-Capillary: 189 mg/dL — ABNORMAL HIGH (ref 70–99)
Glucose-Capillary: 205 mg/dL — ABNORMAL HIGH (ref 70–99)
Glucose-Capillary: 207 mg/dL — ABNORMAL HIGH (ref 70–99)
Glucose-Capillary: 213 mg/dL — ABNORMAL HIGH (ref 70–99)

## 2019-11-06 LAB — BASIC METABOLIC PANEL
Anion gap: 11 (ref 5–15)
BUN: 64 mg/dL — ABNORMAL HIGH (ref 8–23)
CO2: 46 mmol/L — ABNORMAL HIGH (ref 22–32)
Calcium: 7.7 mg/dL — ABNORMAL LOW (ref 8.9–10.3)
Chloride: 84 mmol/L — ABNORMAL LOW (ref 98–111)
Creatinine, Ser: 0.76 mg/dL (ref 0.44–1.00)
GFR, Estimated: >60 mL/min (ref >60)
Glucose, Bld: 205 mg/dL — ABNORMAL HIGH (ref 70–99)
Potassium: 3.4 mmol/L — ABNORMAL LOW (ref 3.5–5.1)
Sodium: 141 mmol/L (ref 135–145)

## 2019-11-06 LAB — MAGNESIUM: Magnesium: 2.9 mg/dL — ABNORMAL HIGH (ref 1.7–2.4)

## 2019-11-06 LAB — PHOSPHORUS: Phosphorus: 5.7 mg/dL — ABNORMAL HIGH (ref 2.5–4.6)

## 2019-11-06 MED ORDER — TRAVASOL 10 % IV SOLN
INTRAVENOUS | Status: AC
  Filled 2019-11-06: qty 756

## 2019-11-06 MED ORDER — GERHARDT'S BUTT CREAM
TOPICAL_CREAM | Freq: Two times a day (BID) | CUTANEOUS | Status: DC
  Administered 2019-11-06 – 2019-11-13 (×5): 1 via TOPICAL
  Filled 2019-11-06: qty 1

## 2019-11-06 MED ORDER — AMIODARONE HCL IN DEXTROSE 360-4.14 MG/200ML-% IV SOLN
30.0000 mg/h | INTRAVENOUS | Status: DC
  Administered 2019-11-06 – 2019-11-09 (×6): 30 mg/h via INTRAVENOUS
  Filled 2019-11-06 (×5): qty 200

## 2019-11-06 MED ORDER — AMIODARONE HCL IN DEXTROSE 360-4.14 MG/200ML-% IV SOLN
60.0000 mg/h | INTRAVENOUS | Status: AC
  Administered 2019-11-06: 60 mg/h via INTRAVENOUS
  Filled 2019-11-06: qty 200

## 2019-11-06 MED ORDER — POLYETHYLENE GLYCOL 3350 17 G PO PACK
17.0000 g | PACK | Freq: Every day | ORAL | Status: DC
  Administered 2019-11-07: 17 g
  Filled 2019-11-06: qty 1

## 2019-11-06 MED ORDER — SENNOSIDES 8.8 MG/5ML PO SYRP: 5.0000 mL | ORAL_SOLUTION | Freq: Every day | ORAL | Status: DC

## 2019-11-06 MED ORDER — VASOPRESSIN 20 UNITS/100 ML INFUSION FOR SHOCK
0.0000 [IU]/min | INTRAVENOUS | Status: DC
  Administered 2019-11-06 – 2019-11-12 (×13): 0.03 [IU]/min via INTRAVENOUS
  Filled 2019-11-06 (×13): qty 100

## 2019-11-06 MED ORDER — INSULIN DETEMIR 100 UNIT/ML ~~LOC~~ SOLN
15.0000 [IU] | Freq: Two times a day (BID) | SUBCUTANEOUS | Status: DC
  Administered 2019-11-06: 15 [IU] via SUBCUTANEOUS
  Filled 2019-11-06 (×3): qty 0.15

## 2019-11-06 MED ORDER — OXYCODONE HCL 5 MG/5ML PO SOLN
5.0000 mg | ORAL | Status: DC | PRN
  Administered 2019-11-08 – 2019-11-11 (×4): 5 mg
  Filled 2019-11-06 (×4): qty 5

## 2019-11-06 MED ORDER — DOCUSATE SODIUM 50 MG/5ML PO LIQD
100.0000 mg | Freq: Two times a day (BID) | ORAL | Status: DC
  Administered 2019-11-07: 100 mg
  Filled 2019-11-06: qty 10

## 2019-11-06 MED ORDER — TRAVASOL 10 % IV SOLN
INTRAVENOUS | Status: DC
  Filled 2019-11-06: qty 756

## 2019-11-06 NOTE — Progress Notes (Signed)
23 Days Post-Op Procedure(s) (LRB): XI ROBOTIC ASSISTED [place] ESOPHAGECTOMY USING NIMS (N/A) VIDEO BRONCHOSCOPY (N/A) JEJUNOSTOMY PLACEMENT (N/A) INTERCOSTAL NERVE BLOCK (Right) Subjective: CXR shows all tubes, drains in place Tolerating TF via NG at 30 cc/hr - pharm will reduce TPN to 50 cc/hr Sputum culture negative so far Responds to command No air leak from chest tubes Objective: Vital signs in last 24 hours: Temp:  [97.6 F (36.4 C)-100 F (37.8 C)] 98.9 F (37.2 C) (10/31 0823) Pulse Rate:  [55-118] 102 (10/31 0900) Cardiac Rhythm: Atrial fibrillation (10/31 0400) Resp:  [19-34] 19 (10/31 0900) BP: (91-146)/(36-109) 116/49 (10/31 0900) SpO2:  [86 %-98 %] 92 % (10/31 0900) FiO2 (%):  [40 %-60 %] 60 % (10/31 0803) Weight:  [67.4 kg] 67.4 kg (10/31 0500)  Hemodynamic parameters for last 24 hours:    Intake/Output from previous day: 10/30 0701 - 10/31 0700 In: 2795.1 [I.V.:1854.2; NG/GT:641.3; IV Piggyback:299.7] Out: 4142 [Urine:3050; Stool:125] Intake/Output this shift: Total I/O In: 192.4 [I.V.:82.4; Other:50; NG/GT:60] Out: 410 [Urine:300; Stool:100; Chest Tube:10]  Coarse breath sounds L neck wound clean w/o granulation tissue- cont wet/dry dressings Abd non-distended Lab Results: Recent Labs    11/04/19 0430 11/05/19 0424  WBC 16.5* 15.3*  HGB 8.3* 8.3*  HCT 28.2* 27.7*  PLT 226 240   BMET:  Recent Labs    11/03/19 2346 11/05/19 0424  NA 140 139  K 4.0 4.2  CL 97* 91*  CO2 35* 38*  GLUCOSE 256* 185*  BUN 44* 46*  CREATININE 0.77 0.75  CALCIUM 7.6* 7.6*    PT/INR: No results for input(s): LABPROT, INR in the last 72 hours. ABG    Component Value Date/Time   PHART 7.372 10/26/2019 0749   HCO3 31.4 (H) 10/26/2019 0749   TCO2 33 (H) 10/26/2019 0749   ACIDBASEDEF 7.8 (H) 10/15/2019 2050   O2SAT 98.0 10/26/2019 0749   CBG (last 3)  Recent Labs    11/06/19 0034 11/06/19 0436 11/06/19 0758  GLUCAP 189* 207* 174*     Assessment/Plan: S/P Procedure(s) (LRB): XI ROBOTIC ASSISTED [place] ESOPHAGECTOMY USING NIMS (N/A) VIDEO BRONCHOSCOPY (N/A) JEJUNOSTOMY PLACEMENT (N/A) INTERCOSTAL NERVE BLOCK (Right) Cont maxipime- WBC improving Check labs, pre-albumin in am  LOS: 23 days    [name] 11/06/2019

## 2019-11-06 NOTE — Progress Notes (Signed)
PHARMACY - TOTAL PARENTERAL NUTRITION CONSULT NOTE  Indication: Intolerance to EN / Prolonged ileus  Patient Measurements: Height: 5\' 2"  (157.5 cm) Weight: 67.4 kg (148 lb 9.4 oz) IBW/kg (Calculated) : 50.1 TPN AdjBW (KG): 56.8 Body mass index is 27.18 kg/m.  Weight = 73 kg on admit  Assessment:  79 YOF presented on 11/05/2019 for bronch, esophagectomy and J-tube placement due to esophageal cancer.  Patient was started on EN post-op on 10/15/19 and tolerated goal rate.  She also passed her swallow evaluation and started on a CLD with nocturnal TF.  She unfortunately developed right PTX and subsequently require intubation, transfer to the ICU, and chest tube placement.  TF has been interrupted frequently since 10/24/19 due to drainage around J-tube and suspected ileus vs pSBO.  Pharmacy consulted for TPN management.  Glucose / Insulin: no hx DM, A1c 5.8% - CBGs/24h 172-207 (sl improvement) with TF started 10/28, Levemir 12 units bid + 3 units q4h incr 10/30 Required 24 units SSI 10/30 + 25 units in TPN Electrolytes: Na down 139 (D5W at 23ml/hr 10/24 >> 10/27, FW 50 q6 added 10/27, decr 10/29), K 4.2, Mag 2.9 (goal >/= 4 for ileus), Phos 5.7, CL low and high CO2 (max acetate in TPN)  - Lasix 40mg  IV TID, metolazone 5mg  x1 on 10/28 Renal: SCr 0.75, BUN 46  LFTs / TGs: LFTs / tbili WNL, TG 168 Prealbumin / albumin: prealbumin < 5, albumin down 1.3 Intake / Output; MIVF: UOP 1.9 ml/kg/hr, CT O/P 40mL, Jtube O/P 673mL, net +6.8L (down) GI Imaging:  10/24 CT abd: ?focal enteritis, postoperative ileus Surgeries / Procedures: none since TPN  Central access: PICC placed 10/21/19 TPN start date: 10/30/19  Nutritional Goals (per RD rec on 10/25): 1600-1800 kCal, 100-120gm protein, >/= 1.5L fluid per day  Current Nutrition:  TPN Vital High Protein at 30 ml/hr =  711kCal, 62g AA (leaking around Jtube site 10/28, thick green secretions out of Jtube site 10/30) - at 30 ml/hr currently ProSource TF 45  ml BID (each provides 90kcal, 15g AA) - 2 given  Plan:  Decrease TPN to rate 50 ml/hr, providing 75g AA and 1072 kCal EN + TPN meeting >100% protein and kcal needs Electrolytes in TPN:no Na, K 38mEq/L (will not increase K+ in TPN further to account for Lasix - supplement outside of TPN as needed), Ca 32mEq/L, Mag 78mEq/L, Phos 18 mmol/L, max acetate  Add standard MVI and trace elements to TPN Continue CVTS SSI Q4H + continue 25 units regular insulin in TPN Continue Levemir 12 units SQ BID and Novolog 3 units Q4H per MD - as TPN is weaned, consider increasing Levemir and TF coverage and taking insulin out of TPN FW decreased to 65mL Q6H per MD F/U AM labs, volume status, CBGs, TF advancement to start tapering TPN  D/C TPN when EN meets >/= 60% of needs per discussion with CVTS on 10/28; discussed rate decrease with TCTS 10/31  Thank you for involving pharmacy in this patient's care.  Renold Genta, PharmD, BCPS Clinical Pharmacist Clinical phone for 11/06/2019 until 3p is [phone] 11/06/2019 7:05 AM  **Pharmacist phone directory can be found on [email or web address] listed under [place]**

## 2019-11-06 NOTE — Progress Notes (Signed)
NAME:  [name], MRN:  [id number], DOB:  09-14-1950, LOS: 64 ADMISSION DATE:  10/29/2019, CONSULTATION DATE: 10/20/2019 REFERRING MD:  Dr. Kipp Brood, CHIEF COMPLAINT:  SOB   Brief History   [name] is a 69 y/o female with a PMHx of esophageal squamous cell carcinoma admitted on 10/08 for esophagectomy with interval development of respiratory distress requiring BiPAP and transfer to ICU.   History of present illness   [name] presented to [facility] for admission for elective esophagectomy on 10/08 that proceeded without any complications. She is currently POD 6.   Overnight, patient developed worsening work of breathing requiring NRB. She was noted to be diaphoretic with decreased lung sounds by the RN. Rapid response was called, who switched chest tube from water seal to suction. Air leak noted in [place]. CXR obtained overnight showed small right basilar pneumothorax with loculated pleural effusion, as well as interval development of extensive airspace infiltrate in the LUL. Repeat CXR this morning shows improvement in pneumothorax with no change in opacities. In the late morning, patient developed worsening respiratory distress requiring BiPAP placement and transfer to ICU for close monitoring.  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 Fib,   Significant Hospital Events   10/08 >> Admitted to [facility] 10/14 >> Transferred to ICU for respiratory distress   Consults:  PCCM  Procedures:  10/08 >> Esophagectomy 10/15 >> ETT placed, Bronchoscopy     Significant Diagnostic Tests:  CXR 10/13 >> Small right basilar pneumothorax, however, has increased in size since prior examination and there has now developed a small amount of laterally loculated pleural fluid. Extensive airspace infiltrate has now developed throughout the left upper lobe, likely infectious in the appropriate clinical setting.   CXR 10/14  >> Right basilar  pneumothorax is smaller compared to earlier in the day. No tension component. Subcutaneous air present on the right. There is extensive airspace opacity throughout the left upper lobe and left perihilar regions, similar to earlier in the day. There is patchy atelectasis in the right mid lung and right base with suspected loculated pleural effusion on the right laterally  ABD xray 10/20 > Mild diffuse air distension of the bowel, either reflecting ileus or low-grade obstruction.  Micro Data:  10/18 Pleural culture - moderate GNR> MODERATE ENTEROBACTER CLOACAE  MODERATE ENTEROBACTER AEROGENES  FEW PSEUDOMONAS AERUGINOSA  10/15 Respiratory culture - Pan-senstive PA Wound culture 10/18 >  10/21 blood>    Antimicrobials:  Zosyn 10/14 >> 10/21 Cefepime 10/21 >10/28, 10/29> Flagyl 10/21 >10/24 Fluconazole 10/22> 10/27  Interim history/subjective:  Afebrile overnight, increased O2 requirements overnight.  Objective   Blood pressure (!) 116/49, pulse (!) 102, temperature 98.9 F (37.2 C), resp. rate 19, height 5\' 2"  (1.575 m), weight 67.4 kg, SpO2 92 %.    Vent Mode: PRVC FiO2 (%):  [40 %-60 %] 60 % Set Rate:  [26 bmp] 26 bmp Vt Set:  [300 mL] 300 mL PEEP:  [8 cmH20] 8 cmH20   Intake/Output Summary (Last 24 hours) at 11/06/2019 1022 Last data filed at 11/06/2019 0900 Gross per 24 hour  Intake 2571.15 ml  Output 2935 ml  Net -363.85 ml   Net for admission +6.8L  Filed Weights   11/04/19 0500 11/05/19 0433 11/06/19 0500  Weight: 70.7 kg 69 kg 67.4 kg   Physical Exam: General: critically ill appearing woman laying in bed in NAD, appears stated age 58: [name]/AT, eyes anicteric Neck: left neck  wound Neuro: RASS -1, globally weak, but following commands.  Tracking. CV: tachycardic, regular rhythm. PULM: Breathing comfortably mechanical ventilation, less rhonchi today. + endotracheal cuff leak intermittently. Minimal chest tube output, remains purulent and anterior chest  tube. RC:VELF, NT, ND. Left-sided J-tube. Extremities: improving edema, no clubbing or cyanosis Skin: pallor, no rashes  CXR 10/31 personally reviewed> persistent left sided opacity, R apical medial pnx. Chest tubes in place.  Resolved Hospital Problem list   AKI  Assessment & Plan:   Acute hypoxemic and hypercarbic respiratory failure  -Reintubated on 10/17. Family agreeable to trach when team is ready. However post-trach care poses a challenge due to the anastomotic leak. Left lung infiltrate persistent since 10/14. Hx of Asthma  P: -Con't LTVV, 4-8cc/kg IBW; goal Pplat <30 and DP <15 -Daily SAT and SBT.  Needs ongoing diuresis before extubation. -VAP prevention protocol -Trach aspirate pending -Sedation is required to tolerate mechanical ventilation.  Septic shock- pneumonia, empyema -Multiple sources including loculated pleural effusion after anastomotic leak from esophagectomy Loculated empyema secondary to anastomotic leak -S/p chest tube for source control -Culture positive for Enterobacter 10/18  Aspiration Pneumonia/Pseudomonas Aeruginosa HCAP Right Pneumothorax  P: -Continue cefepime, was started on 10/29 for worsening leukocytosis with ongoing purulent drainage from chest tube. -Continue vasopressors PRN to maintain MAP >65-- low dose norepi. -Continue chest tubes to suction; these remain in place until extubated. No significant output. -Ongoing diuresis -consider repeat CT chest- concern for possible left sided radiation pneumonitis  Sinus bradycardia Atrial fibrillation- more tachycardic P: -back on amiodarone infusion -Con't tele  Esophageal Squamous Cell Carcinoma s/p Esophagectomy c/b anastomotic leak - Management of incision site per TCTS  P: -Enteral access placed by cardiothoracic surgery -advancing TF and decreasing TPN  Hyperglycemia- controlled -Continue Levemir, increase to 15 units twice daily and TF coverage with hold parameters. -Sliding  scale insulin as needed -Goal BG 140-180 while admitted to the ICU.  Developing Ileus  -ABD xray 10/20 with mild diffuse air distension of the bowel, either reflecting ileus or low-grade obstruction. P: -TF; monitor for intolerance -con't reglan -Continue TPN- decrease rate. Increase TF to 40cc/h as tolerated. -Continue bowel regimen  Non-obstructive CAD LAD saccular aneurysm  HTN  P: -Continue holding PTA Plavix; holding Imdur due to hypotension -Continue daily aspirin  Hx of Hypothyroidism  P: -Continue levothyroxine  High TG, likely due to TPN and propofol P: -Previously discontinued propofol, continue to hold.  Chronic normocytic anemia-stable P: -Transfuse for hemoglobin less than 7 or hemodynamically significant bleeding   Best practice:  Diet: Tube Feeds per G Tube , TPN Pain/Anxiety/Delirium protocol (if indicated): wean off VAP protocol (if indicated): yes DVT prophylaxis: full dose lovenox GI prophylaxis: Protonix Glucose control:  Basal bolus insulin, SSI Mobility: up to chair Code Status: FULL Family Communication:  Disposition: ICU  This patient is critically ill with multiple organ system failure; which, requires frequent high complexity decision making, assessment, support, evaluation, and titration of therapies. This was completed through the application of advanced monitoring technologies and extensive interpretation of multiple databases. During this encounter critical care time was devoted to patient care services described in this note for 41 minutes.  Julian Hy, DO [facility] 11/06/2019 6:08 PM

## 2019-11-06 NOTE — Progress Notes (Signed)
Verbal order from Prescott Gum MD to get an Aerobic Culture from PEG tube site during next dressing change.  Jill Poling, RN 11/06/2019

## 2019-11-07 ENCOUNTER — Inpatient Hospital Stay (HOSPITAL_COMMUNITY): Payer: Medicare Other

## 2019-11-07 DIAGNOSIS — C159 Malignant neoplasm of esophagus, unspecified: Secondary | ICD-10-CM | POA: Diagnosis not present

## 2019-11-07 DIAGNOSIS — J869 Pyothorax without fistula: Secondary | ICD-10-CM | POA: Diagnosis not present

## 2019-11-07 DIAGNOSIS — J9601 Acute respiratory failure with hypoxia: Secondary | ICD-10-CM | POA: Diagnosis not present

## 2019-11-07 DIAGNOSIS — Z9911 Dependence on respirator [ventilator] status: Secondary | ICD-10-CM | POA: Diagnosis not present

## 2019-11-07 LAB — GLUCOSE, CAPILLARY
Glucose-Capillary: 172 mg/dL — ABNORMAL HIGH (ref 70–99)
Glucose-Capillary: 177 mg/dL — ABNORMAL HIGH (ref 70–99)
Glucose-Capillary: 207 mg/dL — ABNORMAL HIGH (ref 70–99)
Glucose-Capillary: 189 mg/dL — ABNORMAL HIGH (ref 70–99)
Glucose-Capillary: 152 mg/dL — ABNORMAL HIGH (ref 70–99)
Glucose-Capillary: 112 mg/dL — ABNORMAL HIGH (ref 70–99)

## 2019-11-07 LAB — COMPREHENSIVE METABOLIC PANEL
ALT: 15 U/L (ref 0–44)
AST: 18 U/L (ref 15–41)
Albumin: 1.6 g/dL — ABNORMAL LOW (ref 3.5–5.0)
Alkaline Phosphatase: 55 U/L (ref 38–126)
Anion gap: 13 (ref 5–15)
BUN: 65 mg/dL — ABNORMAL HIGH (ref 8–23)
CO2: 43 mmol/L — ABNORMAL HIGH (ref 22–32)
Calcium: 7.6 mg/dL — ABNORMAL LOW (ref 8.9–10.3)
Chloride: 82 mmol/L — ABNORMAL LOW (ref 98–111)
Creatinine, Ser: 0.57 mg/dL (ref 0.44–1.00)
GFR, Estimated: >60 mL/min (ref >60)
Glucose, Bld: 268 mg/dL — ABNORMAL HIGH (ref 70–99)
Potassium: 3.3 mmol/L — ABNORMAL LOW (ref 3.5–5.1)
Sodium: 138 mmol/L (ref 135–145)
Total Bilirubin: 0.4 mg/dL (ref 0.3–1.2)
Total Protein: 6 g/dL — ABNORMAL LOW (ref 6.5–8.1)

## 2019-11-07 LAB — CBC
HCT: 25.1 % — ABNORMAL LOW (ref 36.0–46.0)
Hemoglobin: 7.3 g/dL — ABNORMAL LOW (ref 12.0–15.0)
MCH: 28.1 pg (ref 26.0–34.0)
MCHC: 29.1 g/dL — ABNORMAL LOW (ref 30.0–36.0)
MCV: 96.5 fL (ref 80.0–100.0)
Platelets: 261 10*3/uL (ref 150–400)
RBC: 2.6 MIL/uL — ABNORMAL LOW (ref 3.87–5.11)
RDW: 16 % — ABNORMAL HIGH (ref 11.5–15.5)
WBC: 19.7 10*3/uL — ABNORMAL HIGH (ref 4.0–10.5)
nRBC: 0.1 % (ref 0.0–0.2)

## 2019-11-07 LAB — DIFFERENTIAL
Abs Immature Granulocytes: 0.26 10*3/uL — ABNORMAL HIGH (ref 0.00–0.07)
Basophils Absolute: 0.1 10*3/uL (ref 0.0–0.1)
Basophils Relative: 0 %
Eosinophils Absolute: 0.2 10*3/uL (ref 0.0–0.5)
Eosinophils Relative: 1 %
Immature Granulocytes: 1 %
Lymphocytes Relative: 3 %
Lymphs Abs: 0.5 10*3/uL — ABNORMAL LOW (ref 0.7–4.0)
Monocytes Absolute: 1.2 10*3/uL — ABNORMAL HIGH (ref 0.1–1.0)
Monocytes Relative: 6 %
Neutro Abs: 17.6 10*3/uL — ABNORMAL HIGH (ref 1.7–7.7)
Neutrophils Relative %: 89 %

## 2019-11-07 LAB — MAGNESIUM: Magnesium: 2 mg/dL (ref 1.7–2.4)

## 2019-11-07 LAB — TRIGLYCERIDES: Triglycerides: 164 mg/dL — ABNORMAL HIGH (ref <150)

## 2019-11-07 LAB — PREALBUMIN: Prealbumin: 16 mg/dL — ABNORMAL LOW (ref 18–38)

## 2019-11-07 LAB — PHOSPHORUS: Phosphorus: 2.6 mg/dL (ref 2.5–4.6)

## 2019-11-07 LAB — BRAIN NATRIURETIC PEPTIDE: B Natriuretic Peptide: 205.7 pg/mL — ABNORMAL HIGH (ref 0.0–100.0)

## 2019-11-07 MED ORDER — VANCOMYCIN HCL 500 MG/100ML IV SOLN
500.0000 mg | Freq: Two times a day (BID) | INTRAVENOUS | Status: DC
  Administered 2019-11-07 – 2019-11-08 (×4): 500 mg via INTRAVENOUS
  Filled 2019-11-07 (×5): qty 100

## 2019-11-07 MED ORDER — FUROSEMIDE 10 MG/ML IJ SOLN
60.0000 mg | Freq: Three times a day (TID) | INTRAMUSCULAR | Status: DC
  Administered 2019-11-07 – 2019-11-09 (×8): 60 mg via INTRAVENOUS
  Filled 2019-11-07 (×9): qty 6

## 2019-11-07 MED ORDER — VITAL 1.5 CAL PO LIQD
1000.0000 mL | ORAL | Status: DC
  Administered 2019-11-07 – 2019-11-10 (×3): 1000 mL
  Filled 2019-11-07 (×2): qty 1000

## 2019-11-07 MED ORDER — FUROSEMIDE 10 MG/ML IJ SOLN
20.0000 mg | Freq: Once | INTRAMUSCULAR | Status: AC
  Administered 2019-11-07: 20 mg via INTRAVENOUS
  Filled 2019-11-07: qty 2

## 2019-11-07 MED ORDER — VITAL HIGH PROTEIN PO LIQD
1000.0000 mL | ORAL | Status: DC
  Administered 2019-11-07: 1000 mL

## 2019-11-07 MED ORDER — POTASSIUM CHLORIDE 20 MEQ/15ML (10%) PO SOLN
20.0000 meq | ORAL | Status: AC
  Administered 2019-11-07 (×3): 20 meq
  Filled 2019-11-07 (×4): qty 15

## 2019-11-07 MED ORDER — ZINC OXIDE 40 % EX OINT
TOPICAL_OINTMENT | Freq: Two times a day (BID) | CUTANEOUS | Status: DC
  Administered 2019-11-09 – 2019-11-17 (×3): 1 via TOPICAL
  Filled 2019-11-07: qty 57

## 2019-11-07 MED ORDER — INSULIN ASPART 100 UNIT/ML ~~LOC~~ SOLN
7.0000 [IU] | SUBCUTANEOUS | Status: DC
  Administered 2019-11-07 – 2019-11-08 (×4): 7 [IU] via SUBCUTANEOUS

## 2019-11-07 MED ORDER — PROSOURCE TF PO LIQD
45.0000 mL | Freq: Three times a day (TID) | ORAL | Status: DC
  Administered 2019-11-07 – 2019-11-13 (×20): 45 mL
  Filled 2019-11-07 (×19): qty 45

## 2019-11-07 MED ORDER — INSULIN DETEMIR 100 UNIT/ML ~~LOC~~ SOLN
30.0000 [IU] | Freq: Two times a day (BID) | SUBCUTANEOUS | Status: DC
  Administered 2019-11-07 (×2): 30 [IU] via SUBCUTANEOUS
  Filled 2019-11-07 (×4): qty 0.3

## 2019-11-07 MED ORDER — TRAVASOL 10 % IV SOLN
INTRAVENOUS | Status: AC
  Filled 2019-11-07: qty 756

## 2019-11-07 NOTE — Progress Notes (Signed)
Referring Physician(s): Dr Darcey Nora  Supervising Physician: Sandi Mariscal  Patient Status:  [name] - In-pt  Chief Complaint:  Right loculated pleural effusion Rt anterior chest tube drain placed in IR 10/18  Subjective:  Intubated Eyes open- seems alert Both Rt chest tubes in place/intact IR Rt anterior chest tube - 300 cc in vac No change in days  CXR 10/31 IMPRESSION: 1. Stable lines and tubes. 2. Unchanged small right apical pneumothorax. Persistent diffuse left lung opacities.   Allergies: Honey bee venom protein [bee venom], Ether, Nickel, Other, and Tape  Medications: Prior to Admission medications   Medication Sig Start Date End Date Taking? Authorizing Provider  Amino Acids-Protein Hydrolys (FEEDING SUPPLEMENT, PRO-STAT SUGAR FREE 64,) LIQD Place 30 mLs into feeding tube 2 (two) times daily. Patient taking differently: Place 30 mLs into feeding tube in Rachel morning, at noon, in Rachel evening, and at bedtime.  06/14/19  Yes Lavina Hamman, MD  aspirin EC 81 MG tablet Take 1 tablet (81 mg total) by mouth daily. 12/22/18  Yes Tobb, Kardie, DO  BREO ELLIPTA 100-25 MCG/INH AEPB Inhale 1 puff into Rachel lungs at bedtime.  08/18/18  Yes [provider]  Calcium Carbonate-Vitamin D (CALCIUM 600/VITAMIN D PO) Take 1 tablet by mouth daily.   Yes [provider]  EPINEPHrine 0.3 mg/0.3 mL IJ SOAJ injection Inject 0.3 mg into Rachel muscle as needed for anaphylaxis. 11/14/18  Yes [provider]  FLUoxetine (PROZAC) 20 MG capsule Take 20 mg by mouth daily. Pt currently not taking, as she feels she doesn't needs them at this time.   Yes [provider]  gabapentin (NEURONTIN) 300 MG capsule Take 300 mg by mouth 2 (two) times daily.  08/19/18  Yes [provider]  isosorbide mononitrate (IMDUR) 30 MG 24 hr tablet TAKE 1 TABLET BY MOUTH  DAILY Patient taking differently: Take 30 mg by mouth daily.  08/26/19  Yes Tobb, Kardie, DO  levothyroxine  (SYNTHROID) 75 MCG tablet Take 75 mcg by mouth daily before breakfast.  08/23/18  Yes [provider]  metoprolol tartrate (LOPRESSOR) 25 MG tablet Take 0.5 tablets (12.5 mg total) by mouth 2 (two) times daily. 07/12/19 10/12/2019 Yes Tobb, Kardie, DO  mometasone (NASONEX) 50 MCG/ACT nasal spray Place 2 sprays into Rachel nose daily as needed (pain).    Yes [provider]  nitroGLYCERIN (NITROSTAT) 0.4 MG SL tablet Place 1 tablet (0.4 mg total) under Rachel tongue every 5 (five) minutes as needed for chest pain. 07/12/19 10/10/19 Yes Tobb, Kardie, DO  oxyCODONE (OXY IR/ROXICODONE) 5 MG immediate release tablet Take 10 mg by mouth every 4 (four) hours as needed for moderate pain.  09/27/19  Yes [provider]  pantoprazole (PROTONIX) 40 MG tablet Take 40 mg by mouth daily.  10/14/18  Yes [provider]  potassium chloride 20 MEQ/15ML (10%) SOLN Take 20 mEq by mouth daily. 06/22/19  Yes [provider]  promethazine (PHENERGAN) 25 MG tablet Take 25 mg by mouth every 8 (eight) hours as needed for nausea or vomiting.  06/28/19  Yes [provider]  rosuvastatin (CRESTOR) 40 MG tablet TAKE 1 TABLET BY MOUTH  DAILY Patient taking differently: Take 40 mg by mouth daily.  08/26/19  Yes Tobb, Kardie, DO  Water For Irrigation, Sterile (FREE WATER) SOLN Place 200 mLs into feeding tube every 8 (eight) hours. 06/14/19  Yes Lavina Hamman, MD  Ascorbic Acid (VITAMIN C) 100 MG tablet Take 100 mg by mouth  daily. Patient not taking: Reported on 10/03/2019    [provider]  oxyCODONE-acetaminophen (PERCOCET) 10-325 MG tablet Take 1 tablet by mouth every 4 (four) hours as needed for pain. Patient not taking: Reported on 10/03/2019    [provider]  rifaximin (XIFAXAN) 550 MG TABS tablet Take 550 mg by mouth See admin instructions. Take 1 tablet (550 mg) by mouth twice daily for 2 weeks, hold for 4 months then resume cycle Patient not taking: Reported on 10/03/2019     [provider]  sucralfate (CARAFATE) 1 GM/10ML suspension Take 10 mLs (1 g total) by mouth 4 (four) times daily -  with meals and at bedtime. Patient not taking: Reported on 10/03/2019 06/14/19   Lavina Hamman, MD     Vital Signs: BP (!) 124/40   Pulse 87   Temp 99.4 F (37.4 C) (Oral)   Resp 18   Ht 5\' 2"  (1.575 m)   Wt 143 lb 4.8 oz (65 kg)   SpO2 95%   BMI 26.21 kg/m   Physical Exam Skin:    General: Skin is warm.     Comments: Site of Rt anterior chest tube is clean and dry NT no bleeding No sign of infection     Imaging: DG Chest Port 1 View  Result Date: 11/07/2019 CLINICAL DATA:  Endotracheal tube. EXAM: PORTABLE CHEST 1 VIEW COMPARISON:  Yesterday FINDINGS: Endotracheal tube with tip just below Rachel clavicular heads. Left PICC with tip at Rachel SVC. Right porta catheter with tip at Rachel SVC. Right-sided chest tubes. Confluent airspace disease on Rachel left and mild opacity on Rachel right with stable pleural opacity at Rachel lateral right base. No definite pneumothorax. IMPRESSION: Stable hardware positioning and extensive left-sided airspace disease. Electronically Signed   By: Monte Fantasia M.D.   On: 11/07/2019 07:07   DG CHEST PORT 1 VIEW  Result Date: 11/06/2019 CLINICAL DATA:  Chest tube EXAM: PORTABLE CHEST 1 VIEW COMPARISON:  11/05/2019 FINDINGS: Endotracheal tube, right chest wall port, left PICC line, and two right chest tubes are again identified. Unchanged small right apical pneumothorax. Stable right pleural thickening. Diffuse left lung opacities again identified. Stable cardiomediastinal contours. IMPRESSION: 1. Stable lines and tubes. 2. Unchanged small right apical pneumothorax. Persistent diffuse left lung opacities. Electronically Signed   By: Macy Mis M.D.   On: 11/06/2019 08:21   DG Chest Port 1 View  Result Date: 11/05/2019 CLINICAL DATA:  Intubated patient. Respiratory distress. Follow-up exam. EXAM: PORTABLE CHEST 1 VIEW COMPARISON:   11/04/2019 and earlier exams. FINDINGS: Small right apical pneumothorax is stable. Interstitial and hazy airspace opacities throughout Rachel left lung are unchanged. Interstitial thickening on Rachel right, most evident at Rachel base, is also unchanged. No new lung abnormalities. Larger caliber right chest tube, tip near Rachel right apex, stable. Stable smaller caliber chest tube along Rachel right lateral mid hemithorax. Endotracheal tube, nasogastric tube, right-sided Port-A-Cath and left-sided PICC are stable in well positioned. IMPRESSION: 1. No change from Rachel previous day's study. 2. Small right apical pneumothorax. 3. Diffuse left lung interstitial and airspace opacities consistent with pneumonia. 4. Support apparatus is stable and well positioned. Electronically Signed   By: Lajean Manes M.D.   On: 11/05/2019 08:39   DG CHEST PORT 1 VIEW  Result Date: 11/04/2019 CLINICAL DATA:  Intubation.  Chest tubes. EXAM: PORTABLE CHEST 1 VIEW COMPARISON:  11/03/2019 FINDINGS: Endotracheal tube, NG tube, Port-A-Cath, right chest tubes in stable position. Stable small right upper medial pneumothorax.  Stable right-sided pleural thickening. Diffuse left lung infiltrate again noted. Heart size stable. IMPRESSION: 1. Lines and tubes in including right chest tubes in stable position. Stable small right upper medial pneumothorax. Stable right-sided pleural thickening. 2. Diffuse left lung infiltrate again noted without interim change. Electronically Signed   By: Marcello Moores  Register   On: 11/04/2019 06:47    Labs:  CBC: Recent Labs    11/04/19 0430 11/05/19 0424 11/06/19 1111 11/07/19 0522  WBC 16.5* 15.3* 18.1* 19.7*  HGB 8.3* 8.3* 7.6* 7.3*  HCT 28.2* 27.7* 26.0* 25.1*  PLT 226 240 240 261    COAGS: Recent Labs    06/12/19 0020 06/12/19 0244 10/11/19 1000 10/23/2019 0653  INR 1.0  --  1.3*  --   APTT  --  30 >200* 29    BMP: Recent Labs    06/12/19 0610 06/12/19 0610 06/13/19 0329 06/13/19 0329  06/14/19 0401 06/14/19 0401 07/12/19 0854 10/11/19 1000 11/03/19 2346 11/05/19 0424 11/06/19 1211 11/07/19 0522  NA 133*   < > 139   < > 138  --  137   < > 140 139 141 138  K 3.8   < > 3.6   < > 3.3*   < > 4.8   < > 4.0 4.2 3.4* 3.3*  CL 101   < > 107   < > 106   < > 97   < > 97* 91* 84* 82*  CO2 24   < > 23   < > 23   < > 25   < > 35* 38* 46* 43*  GLUCOSE 135*   < > 107*   < > 100*  --  137*   < > 256* 185* 205* 268*  BUN 18   < > 9   < > 9  --  15   < > 44* 46* 64* 65*  CALCIUM 8.0*   < > 8.2*   < > 8.1*   < > 9.6   < > 7.6* 7.6* 7.7* 7.6*  CREATININE 1.19*   < > 0.96   < > 0.89   < > 0.93   < > 0.77 0.75 0.76 0.57  GFRNONAA 47*   < > >60   < > >60   < > 63   < > >60 >60 >60 >60  GFRAA 54*  --  >60  --  >60  --  73  --   --   --   --   --    < > = values in this interval not displayed.    LIVER FUNCTION TESTS: Recent Labs    10/30/19 0107 10/31/19 0400 11/03/19 0627 11/07/19 0522  BILITOT 0.5 0.6 0.6 0.4  AST 23 17 19 18   ALT 13 12 13 15   ALKPHOS 49 54 50 55  PROT 5.0* 5.0* 5.1* 6.0*  ALBUMIN 1.5* 1.4* 1.3* 1.6*    Assessment and Plan:  Loculated right pleural effusion IR Right anterior chest tube in place No real change in OP in few days No air leak TCTS following   Electronically Signed: Lavonia Drafts, PA-C 11/07/2019, 2:22 PM   I spent a total of 15 Minutes at Rachel Rachel patient's bedside AND on Rachel patient's hospital floor or unit, greater than 50% of which was counseling/coordinating care for right anterior chest tube

## 2019-11-07 NOTE — Progress Notes (Addendum)
Nutrition Follow-up  DOCUMENTATION CODES:   Non-severe (moderate) malnutrition in context of chronic illness  INTERVENTION:   Tube Feeding via NG:  Change to Vital 1.5 at 40 ml/hr, goal rate 50 ml/hr Pro-Source TF 45 mL TID Goal rate provides 114 g of protein, 1920 kcals, 912 mL of free water  TPN per Pharmacy; plan to titrate off as TF titrated to goal   NUTRITION DIAGNOSIS:   Moderate Malnutrition related to chronic illness (COPD, esophageal cancer s/p chemoradiation) as evidenced by moderate fat depletion, severe muscle depletion.  Being addressed via nutrition support   GOAL:   Patient will meet greater than or equal to 90% of their needs  Progressing  MONITOR:   Vent status, Labs, Weight trends, TF tolerance, Skin, I & O's, Other (Comment) (TPN)  REASON FOR ASSESSMENT:   Consult Enteral/tube feeding initiation and management  ASSESSMENT:   Patient with PMH significant for COPD, diverticulosis, GERD, SBO, HTN, IBS, mixed HLD, and esophageal squamous cell cancer s/p neoadjuvant chemoradiation therapy s/p PEG. Presents this admission for surgical management of esophageal cancer.  10/09 - s/p esophagectomy, J-tube 10/12 - esophagram with no evidence of anastomotic leak, NGT removed, clear liquids 10/13 - tube feeds transitioned to nocturnal 10/14 - rapid response, CXR showing new right basilar pneumothorax and worsening airspace disease in LUL, chest tube placed back to suction 10/15 - intubated 10/17 - extubated 10/18 - reintubated, right-sided chest tube placed 10/19 - TF held due to drainage around J-tube site 10/23 - TF held again 10/24 - TPN initiated 10/27 - EGD with NG tube placed (tip at pylorus)  Pt remains on vent support; requiring vasopressin and levophed  Noted abd xray pending  TF via NG, J-tube currently not in use. Unable to attempt Cortrak per MD  MD increased TF to 40 ml/hr via NG today; currently on Vital High Protein TPN at 50  ml/hr  J-tube with mod amount of green drainage leaking around insertion site Stage II to sacrum  Weight down to 65 kg  Labs: potassium 3.3 (L) Meds: colace, lasix, ss novolog, novolog q 4 hours, levemir, reglan, KCL, miralax, senokot   Diet Order:   Diet Order    None      EDUCATION NEEDS:   Education needs have been addressed  Skin:  Skin Assessment: Skin Integrity Issues: Skin Integrity Issues:: Incisions, Stage II Stage II: bilateral buttocks Incisions: chest x 2, abdomen x 2, neck  Last BM:  11/1 rectal tube  Height:   Ht Readings from Last 1 Encounters:  10/20/19 5\' 2"  (1.575 m)    Weight:   Wt Readings from Last 1 Encounters:  11/07/19 65 kg    BMI:  Body mass index is 26.21 kg/m.  Estimated Nutritional Needs:   Kcal:  1800-2000  Protein:  100-120 gram  Fluid:  >/= 1.8 L   Kerman Passey MS, RDN, LDN, CNSC Registered Dietitian III Clinical Nutrition RD Pager and On-Call Pager Number Located in [place]

## 2019-11-07 NOTE — Progress Notes (Signed)
NAME:  [name], MRN:  [id number], DOB:  [birth year], LOS: 24 ADMISSION DATE:  10/08/2019, CONSULTATION DATE: 10/20/2019 REFERRING MD:  Dr. Kipp Brood, CHIEF COMPLAINT:  SOB   Brief History   [name] is a 69 y/o female with a PMHx of esophageal squamous cell carcinoma admitted on 10/08 for esophagectomy with interval development of respiratory distress requiring BiPAP and transfer to ICU.   History of present illness   [name] presented to [facility] for admission for elective esophagectomy on 10/08 that proceeded without any complications. She is currently POD 6.   Overnight, patient developed worsening work of breathing requiring NRB. She was noted to be diaphoretic with decreased lung sounds by the RN. Rapid response was called, who switched chest tube from water seal to suction. Air leak noted in [place]. CXR obtained overnight showed small right basilar pneumothorax with loculated pleural effusion, as well as interval development of extensive airspace infiltrate in the LUL. Repeat CXR this morning shows improvement in pneumothorax with no change in opacities. In the late morning, patient developed worsening respiratory distress requiring BiPAP placement and transfer to ICU for close monitoring.  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 Fib,   Significant Hospital Events   10/08 >> Admitted to [facility] 10/14 >> Transferred to ICU for respiratory distress   Consults:  PCCM  Procedures:  10/08 >> Esophagectomy 10/15 >> ETT placed, Bronchoscopy     Significant Diagnostic Tests:  CXR 10/13 >> Small right basilar pneumothorax, however, has increased in size since prior examination and there has now developed a small amount of laterally loculated pleural fluid. Extensive airspace infiltrate has now developed throughout the left upper lobe, likely infectious in the appropriate clinical setting.   CXR 10/14  >> Right basilar  pneumothorax is smaller compared to earlier in the day. No tension component. Subcutaneous air present on the right. There is extensive airspace opacity throughout the left upper lobe and left perihilar regions, similar to earlier in the day. There is patchy atelectasis in the right mid lung and right base with suspected loculated pleural effusion on the right laterally  ABD xray 10/20 > Mild diffuse air distension of the bowel, either reflecting ileus or low-grade obstruction.  Micro Data:  10/18 Pleural culture - moderate GNR> MODERATE ENTEROBACTER CLOACAE  MODERATE ENTEROBACTER AEROGENES  FEW PSEUDOMONAS AERUGINOSA  10/15 Respiratory culture - Pan-senstive PA Wound culture 10/18 >  10/21 blood> NG 10/30 resp> pseudomonas and enterobacter aerogenes  Antimicrobials:  Zosyn 10/14 >> 10/21 Cefepime 10/21 >10/28, 10/29> Flagyl 10/21 >10/24 Fluconazole 10/22> 10/27  Interim history/subjective:  Increasing oxygen requirements overnight.   Objective   Blood pressure (!) 151/47, pulse 91, temperature 97.7 F (36.5 C), temperature source Axillary, resp. rate (!) 26, height 5\' 2"  (1.575 m), weight 65 kg, SpO2 91 %.    Vent Mode: PRVC FiO2 (%):  [60 %-70 %] 70 % Set Rate:  [26 bmp] 26 bmp Vt Set:  [300 mL] 300 mL PEEP:  [8 cmH20-10 cmH20] 10 cmH20 Plateau Pressure:  [23 cmH20] 23 cmH20   Intake/Output Summary (Last 24 hours) at 11/07/2019 0909 Last data filed at 11/07/2019 0700 Gross per 24 hour  Intake 3226.1 ml  Output 1660 ml  Net 1566.1 ml   Net for admission +7.2L  Filed Weights   11/05/19 0433 11/06/19 0500 11/07/19 0500  Weight: 69 kg 67.4 kg 65 kg   Physical Exam: General: Critically ill-appearing woman lying  in bed intubated, sedated HEENT: [place]/AT, eyes anicteric, endotracheal tube in place Neck: left neck wound, bandage c/d/i  Neuro: RASS -4, minimally responsive during exam, breathing above the vent. PULM: Breathing synchronously with the ventilator.  Rales on the  left, rhonchi cleared with suctioning.  Plateau pressure 25 GI: Soft, nontender, nondistended. Yellow clear drainage from Jtube. Mild erythema around jtube. Extremities: No clubbing, cyanosis, minimal peripheral edema Skin: Pallor, no rashes or bruising  CXR 11/1 personally reviewed> persistent left-sided opacity, clearing right lung.  Persistent medial right pneumothorax and 2 chest tubes in appropriate position.  Gradually clearing right lateral hemithorax opacity.  Resolved Hospital Problem list   AKI  Assessment & Plan:   Acute hypoxemic and hypercarbic respiratory failure  -Reintubated on 10/17. Family agreeable to trach when team is ready. However post-trach care poses a challenge due to the anastomotic leak. Left lung infiltrate persistent since 10/14, worsening. Hx of Asthma  P: -Con't LTVV, 4-8cc/kg IBW; goal Pplat <30 and DP <15. Needs lower PEEP strategy given asymmetric lung disease. -Daily SAT and SBT.  Needs ongoing diuresis before extubation.  -VAP prevention protocol -Repeat trach aspirate pending. -Sedation as required to tolerate mechanical ventilation.  Septic shock- pneumonia, empyema -Multiple sources including loculated pleural effusion after anastomotic leak from esophagectomy Loculated empyema secondary to anastomotic leak into R pleural space. -S/p chest tubes for source control -Culture positive for Enterobacter 10/18  Aspiration Pneumonia/Pseudomonas Aeruginosa HCAP Right Pneumothorax  P: -Continue cefepime, which was restarted on 10/29 for worsening leukocytosis with ongoing purulent drainage from chest tube. -Continue vasopressors PRN to maintain MAP >65-- low dose norepi and vasopressin. -Continue chest tubes to suction; these will remain in place until extubated. No significant output. -Increasing diuretics. -Consider repeat CT chest- concern for possible left sided radiation pneumonitis  Sinus bradycardia Atrial fibrillation- more  tachycardic P: -back on amiodarone infusion -Con't tele monitoring  Esophageal Squamous Cell Carcinoma s/p Esophagectomy c/b anastomotic leak - Management of incision site per TCTS  P: -Enteral access placed by cardiothoracic surgery -advancing TF and decreasing TPN  Hyperglycemia- controlled -Continue Levemir, increase to 15 units twice daily and TF coverage with hold parameters. -Sliding scale insulin as needed -Goal BG 140-180 while admitted to the ICU.  Developing Ileus  -ABD xray 10/20 with mild diffuse air distension of the bowel, either reflecting ileus or low-grade obstruction. P: -TF; monitor for intolerance -con't reglan -Continue TPN- decrease rate. Increase TF to 40cc/h as tolerated. -Continue bowel regimen  Non-obstructive CAD LAD saccular aneurysm  HTN  P: -Continue holding PTA Plavix; holding Imdur due to hypotension -Continue daily aspirin  Hx of Hypothyroidism  P: -Continue levothyroxine  High TG, likely due to TPN and propofol P: -Previously discontinued propofol, continue to hold.  Chronic normocytic anemia-stable P: -Transfuse for hemoglobin less than 7 or hemodynamically significant bleeding  Worsening leukocytosis- unknown source of uncontrolled infection -adding vancomycin -con't to follow respiratory culture    Best practice:  Diet: Tube Feeds per G Tube , TPN Pain/Anxiety/Delirium protocol (if indicated): wean off VAP protocol (if indicated): yes DVT prophylaxis: full dose lovenox GI prophylaxis: Protonix Glucose control:  Basal bolus insulin, SSI Mobility: up to chair Code Status: FULL Family Communication:   Disposition: ICU  This patient is critically ill with multiple organ system failure; which, requires frequent high complexity decision making, assessment, support, evaluation, and titration of therapies. This was completed through the application of advanced monitoring technologies and extensive interpretation of multiple  databases. During this encounter critical care  time was devoted to patient care services described in this note for 45 minutes.  Julian Hy, DO [facility] 11/07/2019 12:17 PM

## 2019-11-07 NOTE — Progress Notes (Signed)
PT Cancellation Note  Patient Details Name: LENER VENTRESCA MRN: [id number] DOB: 01-11-1950   Cancelled Treatment:    Reason Eval/Treat Not Completed: Medical issues which prohibited therapy (Pt on 70%FiO2 with respiratory issues this am. HOLD today. )   Godfrey Pick Nox Talent 11/07/2019, 11:59 AM Kaysin Brock W,PT Acute Rehabilitation Services Pager:  [phone]  Office:  [phone]

## 2019-11-07 NOTE — Progress Notes (Signed)
Pharmacy Antibiotic Note  [name] is a 69 y.o. female admitted on 11/05/2019 with sepsis.  Rising temp and growing Enterobacter cloacae + aerogenes in pleural fluid in addition to pseudomonas in BAL. Initial ABX course completed then pressor requirements increased so cefepime restarted, now with ongoing fevers and WBC elevated so vancomycin to restart.  Plan: Continue cefepime 2g IV q8h Vancomycin 577m q12h  Height: _0  (157.5 cm) Weight: 65 kg (143 lb 4.8 oz) IBW/kg (Calculated) : 50.1  Temp (24hrs), Avg:99.3 F (37.4 C), Min:97.7 F (36.5 C), Max:101.3 F (38.5 C)  Recent Labs  Lab 11/02/19 0400 11/03/19 0436 11/03/19 0627 11/03/19 2346 11/04/19 0430 11/05/19 0424 11/06/19 1111 11/06/19 1211 11/07/19 0522  WBC  --  11.4*  --   --  16.5* 15.3* 18.1*  --  19.7*  CREATININE   < >  --  0.71 0.77  --  0.75  --  0.76 0.57   < > = values in this interval not displayed.    Estimated Creatinine Clearance: 58 mL/min (by C-G formula based on SCr of 0.57 mg/dL).    Allergies  Allergen Reactions  . Honey Bee Venom Protein [Bee Venom] Anaphylaxis  . Ether Nausea And Vomiting  . Nickel Other (See Comments)    infection  . Other Other (See Comments)    Pt reports that she cannot have staples placed due to infection.  . Tape Rash    Patient states that she has an allergic reaction to certain tapes. She states that she can tolerate paper tape.    Antimicrobials this admission: Zosyn 10/14>> 10/21 Cefepime 10/21>>10/28; restart 10/29>> Vancomycin 11/1 >> Flagyl 10/21>>10/25 Fluconazole 10/21>>10/27  Microbiology results: BCx 10/21: neg Pleural fluid 10/18: enterobacter cloacae + enterobacter aerogenes (S: cefepime) BAL cx 10/15: pan-sens pseudomonas Body fluid from JP drain 10/15: cancelled (micro never received)  MArrie Senate PharmD, BCPS Clinical Pharmacist [phone] check AMION for all MSan Josenumbers 11/07/2019

## 2019-11-07 NOTE — Plan of Care (Signed)
  Problem: Nutrition: Goal: Adequate nutrition will be maintained Outcome: Progressing   Problem: Safety: Goal: Ability to remain free from injury will improve Outcome: Progressing   Problem: Education: Goal: Knowledge of General Education information will improve Description: Including pain rating scale, medication(s)/side effects and non-pharmacologic comfort measures Outcome: Not Progressing   Problem: Health Behavior/Discharge Planning: Goal: Ability to manage health-related needs will improve Outcome: Not Progressing   Problem: Clinical Measurements: Goal: Ability to maintain clinical measurements within normal limits will improve Outcome: Not Progressing Goal: Will remain free from infection Outcome: Not Progressing Goal: Diagnostic test results will improve Outcome: Not Progressing Goal: Respiratory complications will improve Outcome: Not Progressing Goal: Cardiovascular complication will be avoided Outcome: Not Progressing   Problem: Activity: Goal: Risk for activity intolerance will decrease Outcome: Not Progressing   Problem: Coping: Goal: Level of anxiety will decrease Outcome: Not Progressing   Problem: Elimination: Goal: Will not experience complications related to bowel motility Outcome: Not Progressing Goal: Will not experience complications related to urinary retention Outcome: Not Progressing   Problem: Pain Managment: Goal: General experience of comfort will improve Outcome: Not Progressing   Problem: Skin Integrity: Goal: Risk for impaired skin integrity will decrease Outcome: Not Progressing

## 2019-11-07 NOTE — Progress Notes (Signed)
PHARMACY - TOTAL PARENTERAL NUTRITION CONSULT NOTE  Indication: Intolerance to EN / Prolonged ileus  Patient Measurements: Height: 5\' 2"  (157.5 cm) Weight: 65 kg (143 lb 4.8 oz) IBW/kg (Calculated) : 50.1 TPN AdjBW (KG): 56.8 Body mass index is 26.21 kg/m.  Weight = 73 kg on admit  Assessment:  72 YOF presented on 10/16/2019 for bronch, esophagectomy and J-tube placement due to esophageal cancer.  Patient was started on EN post-op on 10/15/19 and tolerated goal rate.  She also passed her swallow evaluation and started on a CLD with nocturnal TF.  She unfortunately developed right PTX and subsequently require intubation, transfer to the ICU, and chest tube placement.  TF has been interrupted frequently since 10/24/19 due to drainage around J-tube and suspected ileus vs pSBO.  Pharmacy consulted for TPN management.  Glucose / Insulin: no hx DM, A1c 5.8% - CBGs/24h 171-268 with TF started 10/28 - on levemir 30 units BID + 7 units Q4H (both increased 11/1) Required 50 units SSI 10/31 + 25 units in TPN Electrolytes: Na down 138 on FW 56ml Q6H, K 3.3 (goal >/= 4 for ileus) - already supplemented, Mag 2 (goal >/= 2 for ileus), Phos WNL, CL low and high CO2 (max acetate in TPN)  - Lasix 60mg  IV TID Renal: SCr 0.57, BUN 65  LFTs / TGs: LFTs / tbili WNL, TG 164 Prealbumin / albumin: prealbumin up to 16, albumin 1.6 Intake / Output; MIVF: UOP 1 ml/kg/hr, CT O/P 65mL, Jtube O/P 43mL GI Imaging:  10/24 CT abd: ?focal enteritis, postoperative ileus Surgeries / Procedures: none since TPN  Central access: PICC placed 10/21/19 TPN start date: 10/30/19  Nutritional Goals (per RD rec on 10/25): 1600-1800 kCal, 100-120gm protein, >/= 1.5L fluid per day  Current Nutrition:  TPN Vital High Protein at 30 ml/hr =  711kCal, 62g AA (leaking around Jtube site 10/28, thick green secretions out of Jtube site 10/30) - at 30 ml/hr currently ProSource TF 45 ml BID (each provides 90kcal, 15g AA) - 2 given  Plan:   Continue TPN at 50 ml/hr, providing 75g AA and 1072 kCal EN + TPN meeting >100% protein and kcal needs Electrolytes in TPN:no Na, K 86mEq/L (will not increase K+ in TPN further to account for Lasix - supplement outside of TPN as needed), Ca 22mEq/L, Mag 73mEq/L, Phos 18 mmol/L, change Cl:Ac ratio to 1:1 Add standard MVI and trace elements to TPN Continue CVTS SSI Q4H + continue 25 units regular insulin in TPN Continue Levemir 30 units SQ BID and Novolog 7 units Q4H per MD - will not add further insulin to TPN due to increase in levemir outside of TPN F/U AM labs, volume status, CBGs, TF advancement to start tapering TPN  D/C TPN when EN meets >/= 60% of needs per discussion with CVTS on 10/28   Thank you for involving pharmacy in this patient's care.  Salome Arnt, PharmD, BCPS Clinical Pharmacist Please see AMION for all pharmacy numbers 11/07/2019 11:17 AM

## 2019-11-07 NOTE — Consult Note (Addendum)
[facility] Nurse Consult Note: Reason for Consult: Consult requested for sacrum and J tube site.  Wound type: Sacrum with stage 2 pressure injury; appearance and location are consistent with pressure and shear.  1.5X1.5X.1cm, red and dry with irregular edges beginning to lift and peel.  Was previously reported to be bleeding but currently there is no odor, drainage, or fluctuance to the site.  Pressure Injury POA: Yes Left abd with J tube, sutured in place with mod amt green drainage leaking around the insertion site.  Topical treatment will not be effective to promote healing; recommend removal or reinsertion of tube to resolve this problem. Red moist macerated skin with patchy areas of full thickness skin loss related to moisture associated skin damage; affected area is approx 4X4X.2cm, painful to touch.  Dressing procedure/placement/frequency: Topical treatment orders provided for bedside nurses to perform as follows to absorb drainage and attempt to repel moisture against skin: Apply Desitin around J-tube site BID, then cover with Drawtex and ABD pad and tape. Foam dressing to sacrum; change Q 3 days or PRN soiling. Please re-consult if further assistance is needed.  Thank-you,  Julien Girt MSN, [place], [place], [place], [place]

## 2019-11-07 NOTE — Plan of Care (Signed)
Updated s/o [name] and daughter [name] over the phone. Planning for trach tomorrow.  Julian Hy, DO 11/07/19 4:54 PM [facility]

## 2019-11-07 NOTE — Progress Notes (Signed)
[place] [address]       [employer] [id number]             [phone]                 24 Days Post-Op Procedure(s) (LRB): XI ROBOTIC ASSISTED [place] ESOPHAGECTOMY USING NIMS (N/A) VIDEO BRONCHOSCOPY (N/A) JEJUNOSTOMY PLACEMENT (N/A) INTERCOSTAL NERVE BLOCK (Right)   Events: No events _______________________________________________________________ Vitals: BP (!) 127/47    Pulse 89    Temp 98.9 F (37.2 C) (Oral)    Resp (!) 23    Ht 5\' 2"  (1.575 m)    Wt 65 kg    SpO2 99%    BMI 26.21 kg/m   - Neuro: sedated. arousable  - Cardiovascular: Sinus  Drips: Levophed    - Pulm:  Vent Mode: PRVC FiO2 (%):  [60 %-70 %] 70 % Set Rate:  [26 bmp] 26 bmp Vt Set:  [300 mL] 300 mL PEEP:  [8 cmH20-10 cmH20] 8 cmH20 Plateau Pressure:  [23 cmH20] 23 cmH20  ABG    Component Value Date/Time   PHART 7.372 10/26/2019 0749   PCO2ART 54.4 (H) 10/26/2019 0749   PO2ART 105 10/26/2019 0749   HCO3 31.4 (H) 10/26/2019 0749   TCO2 33 (H) 10/26/2019 0749   ACIDBASEDEF 7.8 (H) 10/12/2019 2050   O2SAT 98.0 10/26/2019 0749    - Abd: soft.  J tube in place  - Extremity: warm  .Intake/Output      10/31 0701 - 11/01 0700 11/01 0701 - 11/02 0700   P.O. 0    I.V. (mL/kg) 2148.4 (33.1) 476 (7.3)   Other 50    NG/GT 995 300   IV Piggyback 300 100   Total Intake(mL/kg) 3493.4 (53.7) 876 (13.5)   Urine (mL/kg/hr) 1650 (1.1) 1200 (2.2)   Stool 400    Chest Tube 20    Total Output 2070 1200   Net +1423.4 -324        Urine Occurrence 3 x    Stool Occurrence 3 x       _______________________________________________________________ Labs: CBC Latest Ref Rng & Units 11/07/2019 11/06/2019 11/05/2019  WBC 4.0 - 10.5 K/uL 19.7(H) 18.1(H) 15.3(H)  Hemoglobin 12.0 - 15.0 g/dL 7.3(L) 7.6(L) 8.3(L)  Hematocrit 36 - 46 % 25.1(L) 26.0(L) 27.7(L)  Platelets 150 - 400 K/uL 261 240 240   CMP Latest Ref Rng & Units 11/07/2019 11/06/2019 11/05/2019  Glucose 70 - 99 mg/dL 268(H) 205(H)  185(H)  BUN 8 - 23 mg/dL 65(H) 64(H) 46(H)  Creatinine 0.44 - 1.00 mg/dL 0.57 0.76 0.75  Sodium 135 - 145 mmol/L 138 141 139  Potassium 3.5 - 5.1 mmol/L 3.3(L) 3.4(L) 4.2  Chloride 98 - 111 mmol/L 82(L) 84(L) 91(L)  CO2 22 - 32 mmol/L 43(H) 46(H) 38(H)  Calcium 8.9 - 10.3 mg/dL 7.6(L) 7.7(L) 7.6(L)  Total Protein 6.5 - 8.1 g/dL 6.0(L) - -  Total Bilirubin 0.3 - 1.2 mg/dL 0.4 - -  Alkaline Phos 38 - 126 U/L 55 - -  AST 15 - 41 U/L 18 - -  ALT 0 - 44 U/L 15 - -      _______________________________________________________________  Assessment and Plan: POD 24 s/p Robotic 3 field esophagectomy.  Respiratory failure, cervical anastamotic leak.    Neuro: pain controlled CV: sinus, continue amio gtt for now. Back on low-dose pressors Pulm: Interstitial process process on the left side.  Increased FiO2 over the weekend.  Patient has not made much progress in  regards to her vent wean.  We will plan for a percutaneous tracheostomy tomorrow at the bedside.  This has been discussed with the family. Renal: Creatinine stable GI: Increasing tube feeds.  Will transition back to jejunostomy feeds. Heme: stable ID: wet to dry dressing to cervical incision.  Antibiotics restarted.   Endo: SSI  Dispo: continue ICU care  Melodie Bouillon, MD

## 2019-11-07 DEATH — deceased

## 2019-11-08 ENCOUNTER — Inpatient Hospital Stay (HOSPITAL_COMMUNITY): Payer: Medicare Other

## 2019-11-08 DIAGNOSIS — Z9911 Dependence on respirator [ventilator] status: Secondary | ICD-10-CM | POA: Diagnosis not present

## 2019-11-08 DIAGNOSIS — J151 Pneumonia due to Pseudomonas: Secondary | ICD-10-CM | POA: Diagnosis not present

## 2019-11-08 DIAGNOSIS — J189 Pneumonia, unspecified organism: Secondary | ICD-10-CM | POA: Diagnosis not present

## 2019-11-08 DIAGNOSIS — J9601 Acute respiratory failure with hypoxia: Secondary | ICD-10-CM | POA: Diagnosis not present

## 2019-11-08 LAB — BASIC METABOLIC PANEL
Anion gap: 11 (ref 5–15)
BUN: 75 mg/dL — ABNORMAL HIGH (ref 8–23)
CO2: 48 mmol/L — ABNORMAL HIGH (ref 22–32)
Calcium: 8.3 mg/dL — ABNORMAL LOW (ref 8.9–10.3)
Chloride: 85 mmol/L — ABNORMAL LOW (ref 98–111)
Creatinine, Ser: 0.67 mg/dL (ref 0.44–1.00)
GFR, Estimated: >60 mL/min (ref >60)
Glucose, Bld: 123 mg/dL — ABNORMAL HIGH (ref 70–99)
Potassium: 3.4 mmol/L — ABNORMAL LOW (ref 3.5–5.1)
Sodium: 144 mmol/L (ref 135–145)

## 2019-11-08 LAB — CULTURE, RESPIRATORY W GRAM STAIN: Gram Stain: NONE SEEN

## 2019-11-08 LAB — GLUCOSE, CAPILLARY
Glucose-Capillary: 153 mg/dL — ABNORMAL HIGH (ref 70–99)
Glucose-Capillary: 146 mg/dL — ABNORMAL HIGH (ref 70–99)
Glucose-Capillary: 68 mg/dL — ABNORMAL LOW (ref 70–99)
Glucose-Capillary: 100 mg/dL — ABNORMAL HIGH (ref 70–99)
Glucose-Capillary: 139 mg/dL — ABNORMAL HIGH (ref 70–99)
Glucose-Capillary: 166 mg/dL — ABNORMAL HIGH (ref 70–99)
Glucose-Capillary: 106 mg/dL — ABNORMAL HIGH (ref 70–99)

## 2019-11-08 LAB — MAGNESIUM: Magnesium: 2.1 mg/dL (ref 1.7–2.4)

## 2019-11-08 LAB — PHOSPHORUS: Phosphorus: 2.9 mg/dL (ref 2.5–4.6)

## 2019-11-08 MED ORDER — DEXTROSE 50 % IV SOLN
12.5000 g | INTRAVENOUS | Status: AC
  Administered 2019-11-08: 12.5 g via INTRAVENOUS

## 2019-11-08 MED ORDER — POTASSIUM CHLORIDE 20 MEQ/15ML (10%) PO SOLN
20.0000 meq | ORAL | Status: AC
  Administered 2019-11-08 (×3): 20 meq
  Filled 2019-11-08 (×3): qty 15

## 2019-11-08 MED ORDER — MIDAZOLAM HCL 2 MG/2ML IJ SOLN
4.0000 mg | Freq: Once | INTRAMUSCULAR | Status: AC
  Administered 2019-11-08: 4 mg via INTRAVENOUS
  Filled 2019-11-08: qty 4

## 2019-11-08 MED ORDER — FENTANYL CITRATE (PF) 100 MCG/2ML IJ SOLN
400.0000 ug | Freq: Once | INTRAMUSCULAR | Status: AC
  Administered 2019-11-08: 400 ug via INTRAVENOUS
  Filled 2019-11-08: qty 8

## 2019-11-08 MED ORDER — SODIUM CHLORIDE 0.9 % IV SOLN
1.0000 g | Freq: Three times a day (TID) | INTRAVENOUS | Status: AC
  Administered 2019-11-08 – 2019-11-14 (×21): 1 g via INTRAVENOUS
  Filled 2019-11-08 (×21): qty 1

## 2019-11-08 MED ORDER — DEXTROSE 50 % IV SOLN
INTRAVENOUS | Status: AC
  Filled 2019-11-08: qty 50

## 2019-11-08 MED ORDER — METOCLOPRAMIDE HCL 5 MG/ML IJ SOLN
5.0000 mg | Freq: Four times a day (QID) | INTRAMUSCULAR | Status: DC
  Administered 2019-11-08 – 2019-11-13 (×16): 5 mg via INTRAVENOUS
  Filled 2019-11-08 (×14): qty 2

## 2019-11-08 MED ORDER — ROCURONIUM BROMIDE 10 MG/ML (PF) SYRINGE
70.0000 mg | PREFILLED_SYRINGE | Freq: Once | INTRAVENOUS | Status: AC
  Administered 2019-11-08: 70 mg via INTRAVENOUS
  Filled 2019-11-08: qty 10

## 2019-11-08 MED ORDER — INSULIN DETEMIR 100 UNIT/ML ~~LOC~~ SOLN
20.0000 [IU] | Freq: Two times a day (BID) | SUBCUTANEOUS | Status: DC
  Administered 2019-11-08 – 2019-11-09 (×3): 20 [IU] via SUBCUTANEOUS
  Filled 2019-11-08 (×6): qty 0.2

## 2019-11-08 NOTE — Progress Notes (Signed)
PT Cancellation Note  Patient Details Name: [name] MRN: [id number] DOB: 04-14-1950   Cancelled Treatment:    Reason Eval/Treat Not Completed: Medical issues which prohibited therapy (Pt getting trach this am. Nurse asked PT to HOLD today. )   [name] 11/08/2019, 10:53 AM Daaiel Starlin W,PT Acute Rehabilitation Services Pager:  [phone]  Office:  [phone]

## 2019-11-08 NOTE — Procedures (Signed)
Diagnostic Bronchoscopy  [name]  [id number]  [birth year]  Date:11/08/19  Time:10:45 AM   Provider Performing:Sevanna Ballengee Naomie Dean   Procedure: Diagnostic Bronchoscopy (38756)  Indication(s) Assist with direct visualization of tracheostomy placement  Consent Risks of the procedure as well as the alternatives and risks of each were explained to the patient and/or caregiver.  Consent for the procedure was obtained.   Anesthesia Fentanyl and versed in divided doses.  Rocuronium 70mg    Time Out Verified patient identification, verified procedure, site/side was marked, verified correct patient position, special equipment/implants available, medications/allergies/relevant history reviewed, required imaging and test results available.   Sterile Technique Usual hand hygiene, masks, gowns, and gloves were used   Procedure Description Bronchoscope advanced through endotracheal tube and into airway. During airway inspection she had purulent secretions in the LLL. A small volume of these secretions were able to be suctioned out of culture. During airway inspection while the ETT was being withdrawn, a small hole in the posterior trachea was discovered after secretions that were spontaneously bubbling were removed via suctioning. No bleeding from this hole.  After suctioning out tracheal secretions, bronchoscope used to provide direct visualization of tracheostomy placement. No complications during tracheostomy and confirmation of placement was performed via bronchoscopy through the trachea. Repeat bronchoscopy was performed to remove blood clots from the RLL and repeat bronchial wash and culture from LLL to obtain a larger specimen.   Complications/Tolerance None; patient tolerated the procedure well.   EBL None  Specimen(s) None   Julian Hy, DO 11/08/19 10:51 AM [facility]

## 2019-11-08 NOTE — Progress Notes (Signed)
PHARMACY - TOTAL PARENTERAL NUTRITION CONSULT NOTE  Indication: Intolerance to EN / Prolonged ileus  Patient Measurements: Height: 5\' 2"  (157.5 cm) Weight: 66.3 kg (146 lb 2.6 oz) IBW/kg (Calculated) : 50.1 TPN AdjBW (KG): 56.8 Body mass index is 26.73 kg/m.  Weight = 73 kg on admit  Assessment:  17 YOF presented on 10/19/2019 for bronch, esophagectomy and J-tube placement due to esophageal cancer.  Patient was started on EN post-op on 10/15/19 and tolerated goal rate.  She also passed her swallow evaluation and started on a CLD with nocturnal TF.  She unfortunately developed right PTX and subsequently require intubation, transfer to the ICU, and chest tube placement.  TF has been interrupted frequently since 10/24/19 due to drainage around J-tube and suspected ileus vs pSBO.  Pharmacy consulted for TPN management.  Glucose / Insulin: no hx DM, A1c 5.8% - hypoglycemic this AM. Required 26 units SSI, 25 units in TPN, Levemir decr 20 units BID, off Novolog 7u Q4H Electrolytes: Na 144 (FW 38ml Q6H), K 3.4 (goal >/= 4 for ileus) - already supplemented, CL low and high CO2  - Lasix 60mg  IV TID with KCL 42mEq Q4H x3 doses Renal: SCr 0.67, BUN up to  75  LFTs / TGs: LFTs / tbili WNL, TG 164 Prealbumin / albumin: prealbumin up to 16, albumin 1.6 Intake / Output; MIVF: UOP 2.5 ml/kg/hr, CT O/P up to 72mL, Jtube O/P 546mL GI Imaging:  10/24 CT abd: ?focal enteritis, postoperative ileus Surgeries / Procedures: none since TPN  Central access: PICC placed 10/21/19 TPN start date: 10/30/19  Nutritional Goals (per RD rec on 10/25): 1600-1800 kCal, 100-120gm protein, >/= 1.5L fluid per day  Current Nutrition:  TPN Vital AF 1.5 at 40 ml/hr (goal rate 50 ml/hr)  ProSource TF 45 ml TID (each provides 90kcal, 15g AA) - 3 charted given yesterday  Plan:  Discussed with CCM, wean TPN off as patient is meeting > 60% of needs Reduce TPN to 30 ml/hr, then stop at 1800 (TF held for trach; CCM plans to  restart afterward) Continue CVTS SSI Q4H.  CCM aware of 25 units insulin in TPN and will adjust insulin outside of TPN. D/C TPN labs and nursing care orders  Rashon Rezek [name], PharmD, BCPS, [place] 11/08/2019, 8:45 AM

## 2019-11-08 NOTE — Procedures (Signed)
Bedside Tracheostomy Insertion Procedure Note   Patient Details:   Name: [name] DOB: [birth year] MRN: [id number]  Procedure: Tracheostomy  Pre Procedure Assessment: ET Tube Size: 7.0 ET Tube secured at lip (cm): 24 Bite block in place: No Breath Sounds: Diminished and Rhonch  Post Procedure Assessment: BP (!) 165/62   Pulse 94   Temp 99.2 F (37.3 C) (Oral)   Resp (!) 26   Ht 5\' 2"  (1.575 m)   Wt 66.3 kg   SpO2 100%   BMI 26.73 kg/m  O2 sats: stable throughout Complications: No apparent complications Patient did tolerate procedure well Tracheostomy Brand:Shiley Tracheostomy Style:Cuffed Tracheostomy Size: 8 Tracheostomy Secured QDU:[id number], velcro Tracheostomy Placement Confirmation:Trach cuff visualized and in place and Chest X ray ordered for placement    Kathie Dike 11/08/2019, 10:46 AM

## 2019-11-08 NOTE — Progress Notes (Signed)
[facility] Progress Note Patient Name: [name] DOB: [birth year] MRN: [id number]   Date of Service  11/08/2019  HPI/Events of Note  RN requests restraints order due to patient trying to pull at tracheal tube.   eICU Interventions  Soft bilateral wrist restraints ordered to help maintain patient safety.     Intervention Category Minor Interventions: Agitation / anxiety - evaluation and management  Charlott Rakes 11/08/2019, 12:33 AM

## 2019-11-08 NOTE — Progress Notes (Signed)
NAME:  [name], MRN:  [id number], DOB:  1950-03-24, LOS: 71 ADMISSION DATE:  10/07/2019, CONSULTATION DATE: 10/20/2019 REFERRING MD:  Dr. Kipp Brood, CHIEF COMPLAINT:  SOB   Brief History   [name]. [place] is a 69 y/o female with a PMHx of esophageal squamous cell carcinoma admitted on 10/08 for esophagectomy with interval development of respiratory distress requiring BiPAP and transfer to ICU.   History of present illness   [name] presented to [facility] for admission for elective esophagectomy on 10/08 that proceeded without any complications. She is currently POD 6.   Overnight, patient developed worsening work of breathing requiring NRB. She was noted to be diaphoretic with decreased lung sounds by the RN. Rapid response was called, who switched chest tube from water seal to suction. Air leak noted in [place]. CXR obtained overnight showed small right basilar pneumothorax with loculated pleural effusion, as well as interval development of extensive airspace infiltrate in the LUL. Repeat CXR this morning shows improvement in pneumothorax with no change in opacities. In the late morning, patient developed worsening respiratory distress requiring BiPAP placement and transfer to ICU for close monitoring.  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 Fib,   Significant Hospital Events   10/08 >> Admitted to [facility] 10/14 >> Transferred to ICU for respiratory distress   Consults:  PCCM  Procedures:  10/08 >> Esophagectomy 10/15 >> ETT placed, Bronchoscopy     Significant Diagnostic Tests:  CXR 10/13 >> Small right basilar pneumothorax, however, has increased in size since prior examination and there has now developed a small amount of laterally loculated pleural fluid. Extensive airspace infiltrate has now developed throughout the left upper lobe, likely infectious in the appropriate clinical setting.   CXR 10/14  >> Right basilar  pneumothorax is smaller compared to earlier in the day. No tension component. Subcutaneous air present on the right. There is extensive airspace opacity throughout the left upper lobe and left perihilar regions, similar to earlier in the day. There is patchy atelectasis in the right mid lung and right base with suspected loculated pleural effusion on the right laterally  ABD xray 10/20 > Mild diffuse air distension of the bowel, either reflecting ileus or low-grade obstruction.  Micro Data:  10/18 Pleural culture - moderate GNR> MODERATE ENTEROBACTER CLOACAE  MODERATE ENTEROBACTER AEROGENES  FEW PSEUDOMONAS AERUGINOSA  10/15 Respiratory culture - Pan-senstive PA Wound culture 10/18 >  10/21 blood> NG 10/30 resp> pseudomonas and enterobacter aerogenes  Antimicrobials:  Zosyn 10/14 >> 10/21 Cefepime 10/21 >10/28, 10/29> Flagyl 10/21 >10/24 Fluconazole 10/22> 10/27  Interim history/subjective:  Remains on high oxygen requirements.  She denies complaints today.  Objective   Blood pressure (!) 137/41, pulse 88, temperature 99.2 F (37.3 C), temperature source Oral, resp. rate (!) 25, height 5\' 2"  (1.575 m), weight 66.3 kg, SpO2 94 %.    Vent Mode: PRVC FiO2 (%):  [50 %-100 %] 60 % Set Rate:  [26 bmp-28 bmp] 26 bmp Vt Set:  [300 mL] 300 mL PEEP:  [8 cmH20-10 cmH20] 8 cmH20 Plateau Pressure:  [14 cmH20-25 cmH20] 25 cmH20   Intake/Output Summary (Last 24 hours) at 11/08/2019 0820 Last data filed at 11/08/2019 2778 Gross per 24 hour  Intake 2981.72 ml  Output 3390 ml  Net -408.28 ml   Net for admission +5.6L  Filed Weights   11/06/19 0500 11/07/19 0500 11/08/19 0419  Weight: 67.4 kg 65 kg 66.3 kg  Physical Exam: General: Chronically ill-appearing woman intubated, lightly sedated HEENT: [place]/AT, eyes anicteric Neck: Left neck wound-no bleeding or inflammation Neuro: RASS -1, globally weak but following commands during exam. PULM: Breathing comfortably on mechanical ventilation,  mild dyssynchrony.  Rales on the left.  No significant rhonchi.  Intermittent cuff leak. GI: Soft, nontender, nondistended.   Extremities: No peripheral edema, no clubbing or cyanosis Skin: No significant rashes or bruising  CT chest-diffuse left lung groundglass opacities, consolidation left lower lobe with air bronchograms.  Small left pleural effusion.  Large right paratracheal pneumothorax with communication with the GI tract.  Chest tubes remain in appropriate position.  Resolved Hospital Problem list   AKI  Assessment & Plan:   Acute hypoxemic and hypercarbic respiratory failure  -Reintubated on 10/17. Family agreeable to trach when team is ready. However post-trach care poses a challenge due to the anastomotic leak. Left lung infiltrate persistent since 10/14, worsening. Hx of Asthma  Tracheostomy on 11/08/2019 P: -Continue low tidal volume ventilation, 4 to 8 cc/kg ideal body weight with goal plateau less than 30 and driving pressure less than 15.  Given asymmetric lung disease, would not tolerate high PEEP.  Titrate down FiO2 as able. -Daily SAT and SBT.  Needs ongoing diuresis before extubation.  -VAP prevention protocol -Continue to follow trach aspirate culture -Repeat bronchial wash sent during bronchoscopy. -Sedation as required to tolerate mechanical ventilation.  Septic shock- pneumonia, empyema -Multiple sources including loculated pleural effusion after anastomotic leak from esophagectomy Loculated empyema secondary to anastomotic leak into R pleural space. -S/p chest tubes for source control -Culture positive for Enterobacter 10/18  Aspiration Pneumonia/Pseudomonas Aeruginosa HCAP Right pneumothorax; ongoing communication with disrupted esophageal gastric anastomosis. 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 P: -Escalate cefepime to meropenem given nonresolving left-sided infiltrate. -Continue vasopressors PRN  to maintain MAP >65-- low dose norepi and vasopressin. -Continue chest tubes to suction; these will remain in place until extubated. No significant ongoing output. -Increasing diuretics. -Planning for repeat bronchoscopy later this week to reevaluate location of fistula opening and balloon placement of tracheostomy tube.  Sinus bradycardia Atrial fibrillation- more tachycardic P: -Continue amiodarone infusion -Con't tele monitoring -Can resume Eliquis tomorrow assuming no significant tracheal bleeding.  Esophageal Squamous Cell Carcinoma s/p Esophagectomy c/b anastomotic leak - Management of incision site per TCTS  P: -Enteral access placed by cardiothoracic surgery -advancing TF and decreasing TPN  Hyperglycemia- controlled. 1 episode of hypoglycemia overnight. -Continue Levemir -Holding tube feed insulin while off tube feeds. -Sliding scale insulin as needed -Goal BG 140-180 while admitted to the ICU. -When TPN is discontinued, the included 25 units of insulin may need to be added back to her [place] insulin regimen.  Resolving ileus  -ABD xray 10/20 with mild diffuse air distension of the bowel, either reflecting ileus or low-grade obstruction. P: -Tube feeds held for trach, restarted this afternoon -Decrease Reglan -We will complete TPN tonight -Continue bowel regimen  Non-obstructive CAD LAD saccular aneurysm  HTN  P: -Continue holding PTA Plavix; holding Imdur due to hypotension -Continue daily aspirin  Hx of Hypothyroidism  P: -Continue levothyroxine  High TG, likely due to TPN and propofol P: -Previously discontinued propofol, continue to hold.  Chronic normocytic anemia-stable P: -Transfuse for hemoglobin less than 7 or hemodynamically significant bleeding -On Lovenox today due to tracheostomy  Worsening leukocytosis- unknown source of uncontrolled infection -adding vancomycin; continue today -con't to follow respiratory culture   Best practice:  Diet:  Tube  Feeds per G Tube , TPN Pain/Anxiety/Delirium protocol (if indicated): wean off VAP protocol (if indicated): yes DVT prophylaxis: full dose lovenox GI prophylaxis: Protonix Glucose control:  Basal bolus insulin, SSI Mobility: up to chair Code Status: FULL Family Communication:  granddaughter updated at bedside Disposition: ICU  This patient is critically ill with multiple organ system failure; which, requires frequent high complexity decision making, assessment, support, evaluation, and titration of therapies. This was completed through the application of advanced monitoring technologies and extensive interpretation of multiple databases. During this encounter critical care time was devoted to patient care services described in this note for 40 minutes.  Julian Hy, DO [facility] 11/08/2019 3:35 PM

## 2019-11-08 NOTE — Progress Notes (Signed)
TCTS Evening Rounds  S/p tracheostomy today  BP (!) 152/127    Pulse 83    Temp (!) 97.5 F (36.4 C)    Resp (!) 24    Ht 5\' 2"  (1.575 m)    Wt 66.3 kg    SpO2 94%    BMI 26.73 kg/m   CXR c/w left pneumonia  HD stable Exam unremarkable  A/p: continue present management. Synthia Fairbank Z. Orvan Seen, [place]

## 2019-11-08 NOTE — Progress Notes (Signed)
[facility]       [place] [address]             [phone]                 25 Days Post-Op Procedure(s) (LRB): XI ROBOTIC ASSISTED [place] ESOPHAGECTOMY USING NIMS (N/A) VIDEO BRONCHOSCOPY (N/A) JEJUNOSTOMY PLACEMENT (N/A) INTERCOSTAL NERVE BLOCK (Right)   Events: No events _______________________________________________________________ Vitals: BP (!) 137/41   Pulse 88   Temp 99.2 F (37.3 C) (Oral)   Resp (!) 25   Ht 5\' 2"  (1.575 m)   Wt 66.3 kg   SpO2 98%   BMI 26.73 kg/m   - Neuro: sedated. arousable  - Cardiovascular: Sinus  Drips: Levophed    - Pulm:  Vent Mode: PRVC FiO2 (%):  [50 %-100 %] 50 % Set Rate:  [26 bmp-28 bmp] 26 bmp Vt Set:  [300 mL] 300 mL PEEP:  [8 cmH20] 8 cmH20 Plateau Pressure:  [14 cmH20-25 cmH20] 21 cmH20  ABG    Component Value Date/Time   PHART 7.372 10/26/2019 0749   PCO2ART 54.4 (H) 10/26/2019 0749   PO2ART 105 10/26/2019 0749   HCO3 31.4 (H) 10/26/2019 0749   TCO2 33 (H) 10/26/2019 0749   ACIDBASEDEF 7.8 (H) 10/30/2019 2050   O2SAT 98.0 10/26/2019 0749    - Abd: soft.  J tube in place  - Extremity: warm  .Intake/Output      11/01 0701 - 11/02 0700 11/02 0701 - 11/03 0700   P.O.     I.V. (mL/kg) 1874.1 (28.3) 174.2 (2.6)   Other 150    NG/GT 838    IV Piggyback 200    Total Intake(mL/kg) 3062.2 (46.2) 174.2 (2.6)   Urine (mL/kg/hr) 3900 (2.5) 110 (0.6)   Stool 575    Chest Tube 40    Total Output 4515 110   Net -1452.8 +64.2           _______________________________________________________________ Labs: CBC Latest Ref Rng & Units 11/07/2019 11/06/2019 11/05/2019  WBC 4.0 - 10.5 K/uL 19.7(H) 18.1(H) 15.3(H)  Hemoglobin 12.0 - 15.0 g/dL 7.3(L) 7.6(L) 8.3(L)  Hematocrit 36 - 46 % 25.1(L) 26.0(L) 27.7(L)  Platelets 150 - 400 K/uL 261 240 240   CMP Latest Ref Rng & Units 11/08/2019 11/07/2019 11/06/2019  Glucose 70 - 99 mg/dL 123(H) 268(H) 205(H)  BUN 8 - 23 mg/dL 75(H) 65(H) 64(H)    Creatinine 0.44 - 1.00 mg/dL 0.67 0.57 0.76  Sodium 135 - 145 mmol/L 144 138 141  Potassium 3.5 - 5.1 mmol/L 3.4(L) 3.3(L) 3.4(L)  Chloride 98 - 111 mmol/L 85(L) 82(L) 84(L)  CO2 22 - 32 mmol/L 48(H) 43(H) 46(H)  Calcium 8.9 - 10.3 mg/dL 8.3(L) 7.6(L) 7.7(L)  Total Protein 6.5 - 8.1 g/dL - 6.0(L) -  Total Bilirubin 0.3 - 1.2 mg/dL - 0.4 -  Alkaline Phos 38 - 126 U/L - 55 -  AST 15 - 41 U/L - 18 -  ALT 0 - 44 U/L - 15 -      _______________________________________________________________  Assessment and Plan: POD 25 s/p Robotic 3 field esophagectomy.  Respiratory failure, cervical anastamotic leak.    Neuro: pain controlled CV: sinus, continue amio gtt for now. Back on low-dose pressors Pulm: Interstitial process process on the left side.  Increased FiO2 over the weekend.  Patient has not made much progress in regards to her vent wean.  We will plan for a percutaneous tracheostomy today Renal: Creatinine stable GI: Increasing  tube feeds.  Will transition back to jejunostomy feeds. Heme: stable ID: wet to dry dressing to cervical incision.  Antibiotics restarted.   Endo: SSI  Dispo: continue ICU care  Melodie Bouillon, MD

## 2019-11-08 NOTE — Progress Notes (Signed)
[facility] Progress Note Patient Name: [name] DOB: Dec 05, 1950 MRN: [id number]   Date of Service  11/08/2019  HPI/Events of Note  Agitation - Request to renew bilateral soft wrist restraints.   eICU Interventions  Plan: 1. Will renew bilateral soft wrist restraints X 11 hours.      Intervention Category Major Interventions: Delirium, psychosis, severe agitation - evaluation and management  Ardeth Repetto [place] 11/08/2019, 11:38 PM

## 2019-11-08 NOTE — Procedures (Signed)
Percutaneous Tracheostomy Procedure Note   SHLONDA DOLLOFF  [id number]  1950-03-14  Date:11/08/19  Time:11:34 AM   Provider Performing:Kenzly Rogoff Jenetta Downer Lennex Pietila  Assistant: Dr. Noemi Chapel - performed the bronchoscopy  Procedure: Percutaneous Tracheostomy with Bronchoscopic Guidance (31600)  Indication(s) Respiratory failure.   Prolonged ventilation  Consent Risks of the procedure as well as the alternatives and risks of each were explained to the patient and/or caregiver.  Consent for the procedure was obtained.  Anesthesia Versed, Fentanyl,   Time Out Verified patient identification, verified procedure, site/side was marked, verified correct patient position, special equipment/implants available, medications/allergies/relevant history reviewed, required imaging and test results available.   Sterile Technique Maximal sterile technique including sterile barrier drape, hand hygiene, sterile gown, sterile gloves, mask, hair covering.    Procedure Description Appropriate anatomy identified by palpation.  Patient's neck prepped and draped in sterile fashion.  1% lidocaine with epinephrine was used to anesthetize skin overlying neck.  1.5cm incision made and blunt dissection performed until tracheal rings could be easily palpated.   Then a size 59F Shiley tracheostomy was placed under bronchoscopic visualization using usual Seldinger technique and serial dilation.   Bronchoscope confirmed placement above the carina.  Tracheostomy was sutured in place with adhesive pad to protect skin under pressure.    Patient connected to ventilator.   Complications/Tolerance None; patient tolerated the procedure well. Chest X-ray is ordered to confirm no post-procedural complication.   EBL Minimal   Specimen(s) None

## 2019-11-09 DIAGNOSIS — E43 Unspecified severe protein-calorie malnutrition: Secondary | ICD-10-CM

## 2019-11-09 DIAGNOSIS — J9601 Acute respiratory failure with hypoxia: Secondary | ICD-10-CM | POA: Diagnosis not present

## 2019-11-09 DIAGNOSIS — Z9911 Dependence on respirator [ventilator] status: Secondary | ICD-10-CM | POA: Diagnosis not present

## 2019-11-09 DIAGNOSIS — J398 Other specified diseases of upper respiratory tract: Secondary | ICD-10-CM

## 2019-11-09 DIAGNOSIS — C159 Malignant neoplasm of esophagus, unspecified: Secondary | ICD-10-CM | POA: Diagnosis not present

## 2019-11-09 DIAGNOSIS — R579 Shock, unspecified: Secondary | ICD-10-CM

## 2019-11-09 LAB — GLUCOSE, CAPILLARY
Glucose-Capillary: 101 mg/dL — ABNORMAL HIGH (ref 70–99)
Glucose-Capillary: 103 mg/dL — ABNORMAL HIGH (ref 70–99)
Glucose-Capillary: 116 mg/dL — ABNORMAL HIGH (ref 70–99)
Glucose-Capillary: 120 mg/dL — ABNORMAL HIGH (ref 70–99)
Glucose-Capillary: 123 mg/dL — ABNORMAL HIGH (ref 70–99)
Glucose-Capillary: 50 mg/dL — ABNORMAL LOW (ref 70–99)
Glucose-Capillary: 57 mg/dL — ABNORMAL LOW (ref 70–99)
Glucose-Capillary: 79 mg/dL (ref 70–99)
Glucose-Capillary: 93 mg/dL (ref 70–99)

## 2019-11-09 LAB — CBC WITH DIFFERENTIAL/PLATELET
Abs Immature Granulocytes: 0.1 10*3/uL — ABNORMAL HIGH (ref 0.00–0.07)
Basophils Absolute: 0.1 10*3/uL (ref 0.0–0.1)
Basophils Relative: 0 %
Eosinophils Absolute: 0.2 10*3/uL (ref 0.0–0.5)
Eosinophils Relative: 1 %
HCT: 24.5 % — ABNORMAL LOW (ref 36.0–46.0)
Hemoglobin: 6.9 g/dL — CL (ref 12.0–15.0)
Immature Granulocytes: 1 %
Lymphocytes Relative: 3 %
Lymphs Abs: 0.4 10*3/uL — ABNORMAL LOW (ref 0.7–4.0)
MCH: 28.4 pg (ref 26.0–34.0)
MCHC: 28.2 g/dL — ABNORMAL LOW (ref 30.0–36.0)
MCV: 100.8 fL — ABNORMAL HIGH (ref 80.0–100.0)
Monocytes Absolute: 1.2 10*3/uL — ABNORMAL HIGH (ref 0.1–1.0)
Monocytes Relative: 8 %
Neutro Abs: 13.2 10*3/uL — ABNORMAL HIGH (ref 1.7–7.7)
Neutrophils Relative %: 87 %
Platelets: 276 10*3/uL (ref 150–400)
RBC: 2.43 MIL/uL — ABNORMAL LOW (ref 3.87–5.11)
RDW: 17.1 % — ABNORMAL HIGH (ref 11.5–15.5)
WBC: 15.2 10*3/uL — ABNORMAL HIGH (ref 4.0–10.5)
nRBC: 0 % (ref 0.0–0.2)

## 2019-11-09 LAB — MAGNESIUM: Magnesium: 1.9 mg/dL (ref 1.7–2.4)

## 2019-11-09 LAB — AEROBIC CULTURE W GRAM STAIN (SUPERFICIAL SPECIMEN)
Culture: NO GROWTH
Gram Stain: NONE SEEN
Special Requests: NORMAL

## 2019-11-09 LAB — BASIC METABOLIC PANEL
Anion gap: 14 (ref 5–15)
BUN: 78 mg/dL — ABNORMAL HIGH (ref 8–23)
CO2: 43 mmol/L — ABNORMAL HIGH (ref 22–32)
Calcium: 8.4 mg/dL — ABNORMAL LOW (ref 8.9–10.3)
Chloride: 91 mmol/L — ABNORMAL LOW (ref 98–111)
Creatinine, Ser: 0.66 mg/dL (ref 0.44–1.00)
GFR, Estimated: >60 mL/min (ref >60)
Glucose, Bld: 130 mg/dL — ABNORMAL HIGH (ref 70–99)
Potassium: 3.5 mmol/L (ref 3.5–5.1)
Sodium: 148 mmol/L — ABNORMAL HIGH (ref 135–145)

## 2019-11-09 LAB — PHOSPHORUS: Phosphorus: 4 mg/dL (ref 2.5–4.6)

## 2019-11-09 LAB — PREPARE RBC (CROSSMATCH)

## 2019-11-09 MED ORDER — POTASSIUM CHLORIDE 20 MEQ/15ML (10%) PO SOLN
40.0000 meq | Freq: Once | ORAL | Status: AC
  Administered 2019-11-09: 40 meq
  Filled 2019-11-09: qty 30

## 2019-11-09 MED ORDER — PROPOFOL 1000 MG/100ML IV EMUL
INTRAVENOUS | Status: AC
  Filled 2019-11-09: qty 100

## 2019-11-09 MED ORDER — FREE WATER
300.0000 mL | Freq: Four times a day (QID) | Status: DC
  Administered 2019-11-09 – 2019-11-10 (×3): 300 mL

## 2019-11-09 MED ORDER — MIDAZOLAM HCL 2 MG/2ML IJ SOLN
1.0000 mg | INTRAMUSCULAR | Status: DC | PRN
  Administered 2019-11-09 – 2019-11-15 (×8): 1 mg via INTRAVENOUS
  Filled 2019-11-09 (×8): qty 2

## 2019-11-09 MED ORDER — POTASSIUM CHLORIDE 20 MEQ/15ML (10%) PO SOLN
40.0000 meq | ORAL | Status: AC
  Administered 2019-11-09 (×3): 40 meq
  Filled 2019-11-09 (×3): qty 30

## 2019-11-09 MED ORDER — AMIODARONE HCL 200 MG PO TABS
200.0000 mg | ORAL_TABLET | Freq: Two times a day (BID) | ORAL | Status: DC
  Administered 2019-11-09 – 2019-11-10 (×3): 200 mg
  Filled 2019-11-09 (×3): qty 1

## 2019-11-09 MED ORDER — INSULIN ASPART 100 UNIT/ML ~~LOC~~ SOLN
0.0000 [IU] | SUBCUTANEOUS | Status: DC
  Administered 2019-11-10 (×2): 1 [IU] via SUBCUTANEOUS
  Administered 2019-11-10: 2 [IU] via SUBCUTANEOUS
  Administered 2019-11-11: 1 [IU] via SUBCUTANEOUS
  Administered 2019-11-11 – 2019-11-12 (×6): 2 [IU] via SUBCUTANEOUS
  Administered 2019-11-12: 1 [IU] via SUBCUTANEOUS
  Administered 2019-11-13 – 2019-11-14 (×8): 2 [IU] via SUBCUTANEOUS
  Administered 2019-11-14: 1 [IU] via SUBCUTANEOUS
  Administered 2019-11-14 – 2019-11-15 (×3): 2 [IU] via SUBCUTANEOUS
  Administered 2019-11-15: 1 [IU] via SUBCUTANEOUS
  Administered 2019-11-15: 2 [IU] via SUBCUTANEOUS
  Administered 2019-11-15 – 2019-11-16 (×6): 1 [IU] via SUBCUTANEOUS

## 2019-11-09 MED ORDER — PROPOFOL 1000 MG/100ML IV EMUL
5.0000 ug/kg/min | INTRAVENOUS | Status: DC
  Administered 2019-11-09: 15 ug/kg/min via INTRAVENOUS
  Administered 2019-11-10: 30 ug/kg/min via INTRAVENOUS
  Administered 2019-11-10: 15 ug/kg/min via INTRAVENOUS
  Administered 2019-11-10 (×2): 30 ug/kg/min via INTRAVENOUS
  Administered 2019-11-11: 10 ug/kg/min via INTRAVENOUS
  Administered 2019-11-11 – 2019-11-12 (×3): 30 ug/kg/min via INTRAVENOUS
  Filled 2019-11-09 (×8): qty 100

## 2019-11-09 MED ORDER — SODIUM CHLORIDE 0.9% IV SOLUTION: Freq: Once | INTRAVENOUS | Status: AC

## 2019-11-09 MED ORDER — ENOXAPARIN SODIUM 60 MG/0.6ML ~~LOC~~ SOLN
60.0000 mg | Freq: Two times a day (BID) | SUBCUTANEOUS | Status: DC
  Administered 2019-11-09 – 2019-11-16 (×15): 60 mg via SUBCUTANEOUS
  Filled 2019-11-09 (×16): qty 0.6

## 2019-11-09 MED ORDER — POLYETHYLENE GLYCOL 3350 17 G PO PACK: 17.0000 g | PACK | Freq: Every day | ORAL | Status: DC | PRN

## 2019-11-09 MED ORDER — VANCOMYCIN HCL 500 MG/100ML IV SOLN
500.0000 mg | Freq: Two times a day (BID) | INTRAVENOUS | Status: DC
  Administered 2019-11-09 (×2): 500 mg via INTRAVENOUS
  Filled 2019-11-09 (×3): qty 100

## 2019-11-09 NOTE — Progress Notes (Signed)
NAME:  [name], MRN:  [id number], DOB:  05/02/1950, LOS: 17 ADMISSION DATE:  11/03/2019, CONSULTATION DATE: 10/20/2019 REFERRING MD:  Dr. Kipp Brood, CHIEF COMPLAINT:  SOB   Brief History   [name] is a 69 y/o female with a PMHx of esophageal squamous cell carcinoma admitted on 10/08 for esophagectomy with interval development of respiratory distress requiring BiPAP and transfer to ICU.   History of present illness   [name] presented to [facility] for admission for elective esophagectomy on 10/08 that proceeded without any complications. She is currently POD 6.   Overnight, patient developed worsening work of breathing requiring NRB. She was noted to be diaphoretic with decreased lung sounds by the RN. Rapid response was called, who switched chest tube from water seal to suction. Air leak noted in [place]. CXR obtained overnight showed small right basilar pneumothorax with loculated pleural effusion, as well as interval development of extensive airspace infiltrate in the LUL. Repeat CXR this morning shows improvement in pneumothorax with no change in opacities. In the late morning, patient developed worsening respiratory distress requiring BiPAP placement and transfer to ICU for close monitoring.  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 Fib  Significant Hospital Events   10/08 >> Admitted to [facility] 10/14 >> Transferred to ICU for respiratory distress   Consults:  PCCM  Procedures:  10/08 >> Esophagectomy 10/15 >> ETT placed, Bronchoscopy     Significant Diagnostic Tests:  CXR 10/13 >> Small right basilar pneumothorax, however, has increased in size since prior examination and there has now developed a small amount of laterally loculated pleural fluid. Extensive airspace infiltrate has now developed throughout the left upper lobe, likely infectious in the appropriate clinical setting.   CXR 10/14  >> Right basilar  pneumothorax is smaller compared to earlier in the day. No tension component. Subcutaneous air present on the right. There is extensive airspace opacity throughout the left upper lobe and left perihilar regions, similar to earlier in the day. There is patchy atelectasis in the right mid lung and right base with suspected loculated pleural effusion on the right laterally  ABD xray 10/20 > Mild diffuse air distension of the bowel, either reflecting ileus or low-grade obstruction.  Micro Data:  10/18 Pleural culture - moderate GNR> MODERATE ENTEROBACTER CLOACAE  MODERATE ENTEROBACTER AEROGENES  FEW PSEUDOMONAS AERUGINOSA  10/15 Respiratory culture - Pan-senstive PA Wound culture 10/18 >  10/21 blood> NG 10/30 resp> pseudomonas and enterobacter aerogenes 11/2>  Antimicrobials:  Zosyn 10/14 >> 10/21 Cefepime 10/21 >10/28, 10/29> Flagyl 10/21 >10/24 Fluconazole 10/22> 10/27  Interim history/subjective:  Agitated today, requiring benzos overnight.   Objective   Blood pressure (!) 115/44, pulse 83, temperature 98.1 F (36.7 C), temperature source Axillary, resp. rate (!) 26, height 5\' 2"  (1.575 m), weight 67.2 kg, SpO2 97 %.    Vent Mode: PRVC FiO2 (%):  [70 %-100 %] 70 % Set Rate:  [26 bmp] 26 bmp Vt Set:  [300 mL] 300 mL PEEP:  [8 cmH20] 8 cmH20 Plateau Pressure:  [16 cmH20-23 cmH20] 20 cmH20   Intake/Output Summary (Last 24 hours) at 11/09/2019 1000 Last data filed at 11/09/2019 0800 Gross per 24 hour  Intake 2887.55 ml  Output 3635 ml  Net -747.45 ml   Net for admission +5.6L  Filed Weights   11/07/19 0500 11/08/19 0419 11/09/19 0500  Weight: 65 kg 66.3 kg 67.2 kg   Physical Exam: General: critically ill appearing  woman laying in bed in NAD HEENT: [place]/AT, eyes anicteric Neck: neck wound not examined. Trach, not bleeding. Neuro: RASS +1, wiggles toes on commands, not answering questions. PULM: asynchronous with the vent, intermittent cuff leak but getting near normal  returned Vt. Rhales on the left. GI: soft, NT Extremities: no c/c/e Skin: no rashes,    Resolved Hospital Problem list   AKI  Assessment & Plan:   Acute hypoxemic and hypercarbic respiratory failure  -Reintubated on 10/17. Family agreeable to trach when team is ready. However post-trach care poses a challenge due to the anastomotic leak. Left lung infiltrate persistent since 10/14, worsening. Hx of Asthma  Tracheostomy on 11/08/2019 P: -Continue low tidal volume ventilation, 4 to 8 cc/kg ideal body weight with goal plateau less than 30 and driving pressure less than 15.  Given asymmetric lung disease, would not tolerate high PEEP.  Titrate down FiO2 as able. -Daily SAT and SBT.  Needs ongoing diuresis before extubation.  -VAP prevention protocol -Continue to follow trach aspirate culture -Repeat bronchial wash sent during bronchoscopy. -Sedation as required to tolerate mechanical ventilation.  Septic shock- pneumonia, empyema -Multiple sources including loculated pleural effusion after anastomotic leak from esophagectomy Loculated empyema secondary to anastomotic leak into R pleural space. -S/p chest tubes for source control -Culture positive for Enterobacter 10/18  Aspiration Pneumonia/Pseudomonas aeruginosa HAP Right pneumothorax; ongoing communication with disrupted esophageal gastric anastomosis. 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 P: -Escalate cefepime to meropenem given nonresolving left-sided infiltrate. Can deescalate antibiotics based on 11/2 resp culture. -Continue vasopressors PRN to maintain MAP >65-- low dose norepi and vasopressin. -Continue chest tubes to suction; these will remain in place until extubated. No significant ongoing output. -Con't diuretics. -Planning for repeat bronchoscopy later this week to reevaluate location of fistula opening and balloon placement of tracheostomy tube.  Sinus  bradycardia Atrial fibrillation- more tachycardic P: -Continue amiodarone > switch to infusion -Con't tele monitoring -Can resume Eliquis tomorrow assuming no significant tracheal bleeding.  Esophageal Squamous Cell Carcinoma s/p Esophagectomy c/b anastomotic leak - Management of incision site per TCTS  P: -Enteral access via J-tube placed by cardiothoracic surgery -con't TF at goal, TPN stopped  Hyperglycemia- controlled.  -Continue Levemir -Stop tube feed insulin. -Sliding scale insulin as needed -Goal BG 140-180 while admitted to the ICU.  Resolving ileus  -ABD xray 10/20 with mild diffuse air distension of the bowel, either reflecting ileus or low-grade obstruction. P: -Tube feeds via J tube. Free water through NGT. -Decrease Reglan -off TPN -Deescalate bowel regimen  Non-obstructive CAD LAD saccular aneurysm  HTN  P: -Continue holding PTA Plavix; holding Imdur due to hypotension -Continue daily aspirin  Hx of Hypothyroidism  P: -Continue levothyroxine  High TG, likely due to TPN and propofol P: -Previously discontinued propofol, continue to hold.  Chronic normocytic anemia-stable P: -Transfuse for hemoglobin less than 7 or hemodynamically significant bleeding -On Lovenox today due to tracheostomy  Worsening leukocytosis- unknown source of uncontrolled infection -adding vancomycin; continue today -con't to follow respiratory culture  Hypernatremia -start FWF    Best practice:  Diet: Tube Feeds per G Tube , TPN Pain/Anxiety/Delirium protocol (if indicated): wean off VAP protocol (if indicated): yes DVT prophylaxis: full dose lovenox GI prophylaxis: Protonix Glucose control:  Basal bolus insulin, SSI Mobility: up to chair Code Status: FULL Family Communication:   Disposition: ICU  This patient is critically ill with multiple organ system failure; which, requires frequent high complexity decision making, assessment,  support, evaluation, and  titration of therapies. This was completed through the application of advanced monitoring technologies and extensive interpretation of multiple databases. During this encounter critical care time was devoted to patient care services described in this note for 38 minutes.  Julian Hy, DO [facility] 11/09/2019 2:05 PM

## 2019-11-09 NOTE — Progress Notes (Signed)
eLink Physician-Brief Progress Note Patient Name: CHE BELOW DOB: [birth year] MRN: [id number]   Date of Service  11/09/2019  HPI/Events of Note  Multiple issues: 1. Agitation / Anxiety and 2. Oozing from trach site - Last Hgb = 7.3.  eICU Interventions  Plan: 1. Versed 1 mg IV Q 4 hours PRN sedation or agitation. 2. CBC with platelets at 5 AM.     Intervention Category Major Interventions: Delirium, psychosis, severe agitation - evaluation and management;Other:  Aunika Kirsten Cornelia Copa 11/09/2019, 1:35 AM

## 2019-11-09 NOTE — Plan of Care (Signed)
  Problem: Nutrition: Goal: Adequate nutrition will be maintained Outcome: Progressing   Problem: Safety: Goal: Ability to remain free from injury will improve Outcome: Progressing   Problem: Skin Integrity: Goal: Risk for impaired skin integrity will decrease Outcome: Progressing   Problem: Education: Goal: Knowledge of General Education information will improve Description: Including pain rating scale, medication(s)/side effects and non-pharmacologic comfort measures Outcome: Not Progressing   Problem: Health Behavior/Discharge Planning: Goal: Ability to manage health-related needs will improve Outcome: Not Progressing   Problem: Clinical Measurements: Goal: Ability to maintain clinical measurements within normal limits will improve Outcome: Not Progressing Goal: Will remain free from infection Outcome: Not Progressing Goal: Diagnostic test results will improve Outcome: Not Progressing Goal: Respiratory complications will improve Outcome: Not Progressing Goal: Cardiovascular complication will be avoided Outcome: Not Progressing   Problem: Activity: Goal: Risk for activity intolerance will decrease Outcome: Not Progressing   Problem: Coping: Goal: Level of anxiety will decrease Outcome: Not Progressing   Problem: Elimination: Goal: Will not experience complications related to bowel motility Outcome: Not Progressing Goal: Will not experience complications related to urinary retention Outcome: Not Progressing   Problem: Pain Managment: Goal: General experience of comfort will improve Outcome: Not Progressing

## 2019-11-09 NOTE — Progress Notes (Signed)
      [address]       [place] [address]             [phone]      POD # 26  Intubated BP (!) 135/46   Pulse 78   Temp 98.1 F (36.7 C) (Axillary)   Resp (!) 26   Ht 5\' 2"  (1.575 m)   Wt 67.2 kg   SpO2 95%   BMI 27.10 kg/m  PRVC 26/70%/8 PEEP  Intake/Output Summary (Last 24 hours) at 11/09/2019 1808 Last data filed at 11/09/2019 1700 Gross per 24 hour  Intake 3394.05 ml  Output 2510 ml  Net 884.05 ml   Agitation- started on propofol  Tolerating Tf  Rachel Lipps C. Roxan Hockey, MD [facility] [phone]

## 2019-11-09 NOTE — Progress Notes (Addendum)
Occupational Therapy Treatment Patient Details Name: [name] MRN: [id number] DOB: 07/16/1950 Today's Date: 11/09/2019    History of present illness 69 yo with history of esophageal CA admitted for esophagectomy with jejunostomy placement Pt with respiratory distress on 10/14, transferred to ICU and intubated on 10/15 with one failed attempt to extubate. Pt with anastamotic leak, NGT placed 11/02/19. Pt received trach on 11/09/2019.  Additional PMHx: CoPD, Asthma, HTN, HLD, hypothyroidism, CAD   OT comments  Pt restless and reaching toward daughter, but not responding to commands or nodding head as she did last visit. +2 total assist for bed mobility and positioning in bed. Vent settings: Fi02 70%, PEEP 8. Pt with decline in functional status, goals and d/c recommendation updated to reflect. Will continue to follow.  Follow Up Recommendations  LTACH;Supervision/Assistance - 24 hour    Equipment Recommendations  Other (comment) (defer to next venue)    Recommendations for Other Services      Precautions / Restrictions Precautions Precautions: Fall;Other (comment) Precaution Comments: 2 chest tubes, j tube, Vent with trach, flexiseal       Mobility Bed Mobility   Bed Mobility: Rolling Rolling: +2 for physical assistance;Total assist         General bed mobility comments: repositioned in bed with wedge placed behind  Transfers                      Balance                                           ADL either performed or assessed with clinical judgement   ADL                                               Vision       Perception     Praxis      Cognition Arousal/Alertness: Awake/alert Behavior During Therapy: Restless;Anxious Overall Cognitive Status: Difficult to assess                                 General Comments: vent with trach, not responding to yes/no or following commands         Exercises     Shoulder Instructions       General Comments      Pertinent Vitals/ Pain       Pain Assessment: Faces Faces Pain Scale: Hurts little more Pain Location: generalized Pain Descriptors / Indicators: Sore  Home Living                                          Prior Functioning/Environment              Frequency  Min 2X/week        Progress Toward Goals  OT Goals(current goals can now be found in the care plan section)  Progress towards OT goals: Not progressing toward goals - comment  Acute Rehab OT Goals OT Goal Formulation: Patient unable to participate in goal setting Time For Goal Achievement: 11/23/19 Potential to Achieve Goals: [place] Discharge  plan needs to be updated    Co-evaluation                 AM-PAC OT "6 Clicks" Daily Activity     Outcome Measure   Help from another person eating meals?: Total Help from another person taking care of personal grooming?: Total Help from another person toileting, which includes using toliet, bedpan, or urinal?: Total Help from another person bathing (including washing, rinsing, drying)?: Total Help from another person to put on and taking off regular upper body clothing?: Total Help from another person to put on and taking off regular lower body clothing?: Total 6 Click Score: 6    End of Session    OT Visit Diagnosis: Muscle weakness (generalized) (M62.81)   Activity Tolerance Treatment limited secondary to medical complications (Comment) (restlessness)   Patient Left in bed;with nursing/sitter in room;with family/visitor present;with restraints reapplied   Nurse Communication Mobility status        Time: 8372-9021 OT Time Calculation (min): 17 min  Charges: OT General Charges $OT Visit: 1 Visit OT Treatments $Therapeutic Activity: 8-22 mins  Nestor Lewandowsky, OTR/L Acute Rehabilitation Services Pager: [phone] Office: [phone]   Malka So 11/09/2019, 4:50 PM

## 2019-11-09 NOTE — Progress Notes (Signed)
Physical Therapy Treatment Patient Details Name: [name] MRN: [id number] DOB: [birth year] Today's Date: 11/09/2019    History of Present Illness 69 yo with history of esophageal CA admitted for esophagectomy with jejunostomy placement Pt with respiratory distress on 10/14, transferred to ICU and intubated on 10/15 with one failed attempt to extubate. Pt with anastamotic leak, NGT placed 11/02/19. Pt received trach on 11/09/2019.  Additional PMHx: CoPD, Asthma, HTN, HLD, hypothyroidism, CAD    PT Comments    Pt admitted with above diagnosis. Pt met 0/3 goals due to multiple medical issues.  Goals revised today.  Pt was able to work with PT today and was able to sit EOB 10 min with mod assist of 2 most of the time but periods of min assist for balance.  Following commands 75% of time.  Pt somewhat impulsive today at times as she is restless. Nurse aware and pt in chair position on departure with restraints in place.   Pt currently with functional limitations due to the deficits listed below (see PT Problem List). Pt will benefit from skilled PT to increase their independence and safety with mobility to allow discharge to the venue listed below.    Follow Up Recommendations  CIR;Supervision/Assistance - 24 hour     Equipment Recommendations  Other (comment) (TBD)    Recommendations for Other Services Rehab consult     Precautions / Restrictions Precautions Precautions: Fall;Other (comment) Precaution Comments: 2 chest tubes, j tube, Vent with trach Restrictions Weight Bearing Restrictions: No    Mobility  Bed Mobility Overal bed mobility: Needs Assistance Bed Mobility: Supine to Sit;Sit to Supine     Supine to sit: HOB elevated;Mod assist;Max assist;+2 for physical assistance Sit to supine: Mod assist;Max assist;+2 for physical assistance   General bed mobility comments: Needs assist to initiate trunk and LEs to EOB as well a assist to back into bed.  +2 total assist to pull up  in bed prior to placing in chair position  Transfers                 General transfer comment: deferred  Ambulation/Gait                 Stairs             Wheelchair Mobility    Modified Rankin (Stroke Patients Only)       Balance Overall balance assessment: Needs assistance Sitting-balance support: No upper extremity supported;Feet supported;Bilateral upper extremity supported Sitting balance-Leahy Scale: Poor Sitting balance - Comments: leans bilateral directions side and side and anterior/posterior.  Pt needed mod assist to sit most of time however periods of min to min gaurd assist for only seconds at a time and needed close guard assist for safety.  Sat a total of 10 min and did perform some LE exercises to commands.                                     Cognition Arousal/Alertness: Awake/alert Behavior During Therapy: Anxious;Restless;Impulsive Overall Cognitive Status: Difficult to assess                                 General Comments: Vent with trach but answering yes/no questions with head nod      Exercises General Exercises - Upper Extremity Shoulder Flexion: Both;AAROM;5 reps;Seated Shoulder Extension: Both;10 reps;Supine;AAROM  General Exercises - Lower Extremity Ankle Circles/Pumps: AAROM;Both;10 reps;Seated Long Arc Quad: AAROM;Both;10 reps;Seated    General Comments General comments (skin integrity, edema, etc.): Vent setting 70% FiO2 with PEEP 8, O2 90-97% with activity.  HR 74-90bpm.        Pertinent Vitals/Pain Pain Assessment: Faces Faces Pain Scale: Hurts little more Pain Location: generalized Pain Descriptors / Indicators: Sore Pain Intervention(s): Monitored during session;Limited activity within patient's tolerance;Repositioned    Home Living                      Prior Function            PT Goals (current goals can now be found in the care plan section) Acute Rehab PT  Goals PT Goal Formulation: With patient Time For Goal Achievement: 11/23/19 Potential to Achieve Goals: Good Progress towards PT goals: Progressing toward goals;Goals downgraded-see care plan    Frequency    Min 3X/week      PT Plan Current plan remains appropriate    Co-evaluation              AM-PAC PT "6 Clicks" Mobility   Outcome Measure  Help needed turning from your back to your side while in a flat bed without using bedrails?: A Lot Help needed moving from lying on your back to sitting on the side of a flat bed without using bedrails?: A Lot Help needed moving to and from a bed to a chair (including a wheelchair)?: Total Help needed standing up from a chair using your arms (e.g., wheelchair or bedside chair)?: Total Help needed to walk in hospital room?: Total Help needed climbing 3-5 steps with a railing? : Total 6 Click Score: 8    End of Session   Activity Tolerance: Patient limited by fatigue (limited by impulsivity) Patient left: with call bell/phone within reach;with bed alarm set;in bed;with SCD's reapplied;with restraints reapplied (pt in chair position) Nurse Communication: Mobility status;Need for lift equipment PT Visit Diagnosis: Other abnormalities of gait and mobility (R26.89);Difficulty in walking, not elsewhere classified (R26.2);Muscle weakness (generalized) (M62.81)     Time: 1173-5670 PT Time Calculation (min) (ACUTE ONLY): 33 min  Charges:  $Therapeutic Exercise: 8-22 mins $Therapeutic Activity: 8-22 mins                     Bao Bazen W,PT Acute Rehabilitation Services Pager:  [phone]  Office:  [place] 11/09/2019, 11:01 AM

## 2019-11-09 NOTE — Progress Notes (Signed)
[address]       [place] [address]             [phone]                 26 Days Post-Op Procedure(s) (LRB): XI ROBOTIC ASSISTED [place] ESOPHAGECTOMY USING NIMS (N/A) VIDEO BRONCHOSCOPY (N/A) JEJUNOSTOMY PLACEMENT (N/A) INTERCOSTAL NERVE BLOCK (Right)   Events: No events _______________________________________________________________ Vitals: BP (!) 115/44   Pulse 83   Temp 98.1 F (36.7 C) (Axillary)   Resp (!) 26   Ht 5\' 2"  (1.575 m)   Wt 67.2 kg   SpO2 97%   BMI 27.10 kg/m   - Neuro: sedated. arousable  - Cardiovascular: Sinus  Drips: Levophed    - Pulm:  Vent Mode: PRVC FiO2 (%):  [70 %-100 %] 70 % Set Rate:  [26 bmp] 26 bmp Vt Set:  [300 mL] 300 mL PEEP:  [8 cmH20] 8 cmH20 Plateau Pressure:  [16 cmH20-23 cmH20] 20 cmH20  ABG    Component Value Date/Time   PHART 7.372 10/26/2019 0749   PCO2ART 54.4 (H) 10/26/2019 0749   PO2ART 105 10/26/2019 0749   HCO3 31.4 (H) 10/26/2019 0749   TCO2 33 (H) 10/26/2019 0749   ACIDBASEDEF 7.8 (H) 10/10/2019 2050   O2SAT 98.0 10/26/2019 0749    - Abd: soft.  J tube in place  - Extremity: warm  .Intake/Output      11/02 0701 - 11/03 0700 11/03 0701 - 11/04 0700   I.V. (mL/kg) 1438.2 (21.4) 55.9 (0.8)   Other 310    NG/GT 775 82.7   IV Piggyback 400    Total Intake(mL/kg) 2923.2 (43.5) 138.6 (2.1)   Urine (mL/kg/hr) 3325 (2.1) 100 (0.7)   Stool 300    Chest Tube 20    Total Output 3645 100   Net -721.8 +38.6           _______________________________________________________________ Labs: CBC Latest Ref Rng & Units 11/09/2019 11/07/2019 11/06/2019  WBC 4.0 - 10.5 K/uL 15.2(H) 19.7(H) 18.1(H)  Hemoglobin 12.0 - 15.0 g/dL 6.9(LL) 7.3(L) 7.6(L)  Hematocrit 36 - 46 % 24.5(L) 25.1(L) 26.0(L)  Platelets 150 - 400 K/uL 276 261 240   CMP Latest Ref Rng & Units 11/09/2019 11/08/2019 11/07/2019  Glucose 70 - 99 mg/dL 130(H) 123(H) 268(H)  BUN 8 - 23 mg/dL 78(H) 75(H) 65(H)  Creatinine  0.44 - 1.00 mg/dL 0.66 0.67 0.57  Sodium 135 - 145 mmol/L 148(H) 144 138  Potassium 3.5 - 5.1 mmol/L 3.5 3.4(L) 3.3(L)  Chloride 98 - 111 mmol/L 91(L) 85(L) 82(L)  CO2 22 - 32 mmol/L 43(H) 48(H) 43(H)  Calcium 8.9 - 10.3 mg/dL 8.4(L) 8.3(L) 7.6(L)  Total Protein 6.5 - 8.1 g/dL - - 6.0(L)  Total Bilirubin 0.3 - 1.2 mg/dL - - 0.4  Alkaline Phos 38 - 126 U/L - - 55  AST 15 - 41 U/L - - 18  ALT 0 - 44 U/L - - 15      _______________________________________________________________  Assessment and Plan: POD 25 s/p Robotic 3 field esophagectomy.  Respiratory failure, cervical anastamotic leak.    Neuro: pain controlled CV: sinus, continue amio gtt for now. Back on low-dose pressors Pulm:tolerating TC.  Does have a small hole in the membranous portion of the trachea.  Will exchange trach for XLT in the next few days Renal: Creatinine stable GI: Increasing tube feeds.   Heme: stable ID: wet to dry dressing to cervical incision.  Antibiotics restarted.  Endo: SSI  Dispo: continue ICU care  Melodie Bouillon, MD

## 2019-11-09 NOTE — Progress Notes (Signed)
eLink Physician-Brief Progress Note Patient Name: [name] DOB: March 08, 1950 MRN: [id number]   Date of Service  11/09/2019  HPI/Events of Note  Anemia - Hgb = 6.9. Oozing from fresh tracheostomy site. Which has now stopped per bedside nurse.   eICU Interventions  Plan: 1. Transfuse 1 unit PRBC,     Intervention Category Major Interventions: Other:  Lysle Dingwall 11/09/2019, 5:44 AM

## 2019-11-09 NOTE — Progress Notes (Signed)
[place] for lovenox Indication: atrial fibrillation  Allergies  Allergen Reactions  . Honey Bee Venom Protein [Bee Venom] Anaphylaxis  . Ether Nausea And Vomiting  . Nickel Other (See Comments)    infection  . Other Other (See Comments)    Pt reports that she cannot have staples placed due to infection.  . Tape Rash    Patient states that she has an allergic reaction to certain tapes. She states that she can tolerate paper tape.    Patient Measurements: Height: 5\' 2"  (157.5 cm) Weight: 67.2 kg (148 lb 2.4 oz) IBW/kg (Calculated) : 50.1  Vital Signs: Temp: 98.1 F (36.7 C) (11/03 1035) Temp Source: Axillary (11/03 1035) BP: 117/40 (11/03 1000) Pulse Rate: 78 (11/03 1035)  Labs: Recent Labs    11/06/19 1111 11/06/19 1211 11/07/19 0522 11/08/19 0239 11/09/19 0320  HGB 7.6*   < > 7.3*  --  6.9*  HCT 26.0*  --  25.1*  --  24.5*  PLT 240  --  261  --  276  CREATININE  --    < > 0.57 0.67 0.66   < > = values in this interval not displayed.    Estimated Creatinine Clearance: 58.8 mL/min (by C-G formula based on SCr of 0.66 mg/dL).   Medical History: Past Medical History:  Diagnosis Date  . Asthma   . Cancer ([place])    esophageal cancer  . COPD (chronic obstructive pulmonary disease) ([place])   . Coronary artery disease   . Depression   . Diabetes mellitus without complication ([place])    Type II  . Diastolic dysfunction   . Diverticulosis   . Fatty liver   . GERD without esophagitis   . Hx of Clostridium difficile infection   . Hx of colonic polyps   . Hx of small bowel obstruction   . Hypertension, benign   . Hypothyroidism   . Irritable bowel syndrome   . Left bundle branch block   . Lumbar herniated disc   . Mixed hyperlipidemia   . Osteoarthritis   . PAF (paroxysmal atrial fibrillation) ([place])    06/2019  . Pneumonia   . Sleep apnea    Uses O2 at bedtime on occasion    Assessment: 69 yo F s/p esophagectomy with  history of atrial fibrillation. Was on Xarelto PTA. Patient was receiving Eliquis since Xarelto cannot go down J tube (last Eliquis dose 10/24 ~2200), but J tube is no longer working. Patient was switched to Lovenox and NG tube placed 10/27.  Enoxaparin held 11/2 with need for trach, will resume today. H/H low but likely mix of dilutional and tracheal bleeding yesterday afternoon (now resolved).  Goal of Therapy:  Anti-Xa (LMWH) peak level 0.6-1.0 units/mL Monitor platelets by anticoagulation protocol: Yes   Plan:   Restart Enoxaparin to 60 mg Q12h Monitor CBC, s/sx bleeding F/u ability to switch back to Eliquis  Arrie Senate, PharmD, BCPS Clinical Pharmacist [phone] Please check AMION for all [place] numbers 11/09/2019

## 2019-11-10 ENCOUNTER — Inpatient Hospital Stay (HOSPITAL_COMMUNITY): Payer: Medicare Other

## 2019-11-10 DIAGNOSIS — J9601 Acute respiratory failure with hypoxia: Secondary | ICD-10-CM | POA: Diagnosis not present

## 2019-11-10 DIAGNOSIS — E870 Hyperosmolality and hypernatremia: Secondary | ICD-10-CM

## 2019-11-10 DIAGNOSIS — Z9911 Dependence on respirator [ventilator] status: Secondary | ICD-10-CM | POA: Diagnosis not present

## 2019-11-10 LAB — GLUCOSE, CAPILLARY
Glucose-Capillary: 128 mg/dL — ABNORMAL HIGH (ref 70–99)
Glucose-Capillary: 106 mg/dL — ABNORMAL HIGH (ref 70–99)
Glucose-Capillary: 135 mg/dL — ABNORMAL HIGH (ref 70–99)
Glucose-Capillary: 195 mg/dL — ABNORMAL HIGH (ref 70–99)
Glucose-Capillary: 150 mg/dL — ABNORMAL HIGH (ref 70–99)

## 2019-11-10 LAB — TYPE AND SCREEN
ABO/RH(D): A POS
Antibody Screen: NEGATIVE
Unit division: 0

## 2019-11-10 LAB — CBC
HCT: 30.3 % — ABNORMAL LOW (ref 36.0–46.0)
Hemoglobin: 8.8 g/dL — ABNORMAL LOW (ref 12.0–15.0)
MCH: 28.9 pg (ref 26.0–34.0)
MCHC: 29 g/dL — ABNORMAL LOW (ref 30.0–36.0)
MCV: 99.3 fL (ref 80.0–100.0)
Platelets: 316 10*3/uL (ref 150–400)
RBC: 3.05 MIL/uL — ABNORMAL LOW (ref 3.87–5.11)
RDW: 17.7 % — ABNORMAL HIGH (ref 11.5–15.5)
WBC: 14.4 10*3/uL — ABNORMAL HIGH (ref 4.0–10.5)
nRBC: 0 % (ref 0.0–0.2)

## 2019-11-10 LAB — BPAM RBC
Blood Product Expiration Date: 202111222359
ISSUE DATE / TIME: 202111031006
Unit Type and Rh: 6200

## 2019-11-10 LAB — BASIC METABOLIC PANEL
Anion gap: 12 (ref 5–15)
BUN: 56 mg/dL — ABNORMAL HIGH (ref 8–23)
CO2: 42 mmol/L — ABNORMAL HIGH (ref 22–32)
Calcium: 8.6 mg/dL — ABNORMAL LOW (ref 8.9–10.3)
Chloride: 94 mmol/L — ABNORMAL LOW (ref 98–111)
Creatinine, Ser: 0.67 mg/dL (ref 0.44–1.00)
GFR, Estimated: >60 mL/min (ref >60)
Glucose, Bld: 140 mg/dL — ABNORMAL HIGH (ref 70–99)
Potassium: 4.4 mmol/L (ref 3.5–5.1)
Sodium: 148 mmol/L — ABNORMAL HIGH (ref 135–145)

## 2019-11-10 LAB — TRIGLYCERIDES: Triglycerides: 226 mg/dL — ABNORMAL HIGH (ref <150)

## 2019-11-10 MED ORDER — DEXTROSE 5 % IV SOLN
INTRAVENOUS | Status: DC
  Administered 2019-11-10: 50 mL/h via INTRAVENOUS

## 2019-11-10 MED ORDER — FREE WATER: 300.0000 mL | Status: DC

## 2019-11-10 MED ORDER — AMIODARONE HCL IN DEXTROSE 360-4.14 MG/200ML-% IV SOLN
30.0000 mg/h | INTRAVENOUS | Status: DC
  Administered 2019-11-10 – 2019-11-15 (×11): 30 mg/h via INTRAVENOUS
  Filled 2019-11-10 (×10): qty 200

## 2019-11-10 MED ORDER — AMIODARONE HCL IN DEXTROSE 360-4.14 MG/200ML-% IV SOLN
60.0000 mg/h | INTRAVENOUS | Status: AC
  Administered 2019-11-10: 60 mg/h via INTRAVENOUS
  Filled 2019-11-10: qty 200

## 2019-11-10 MED ORDER — INSULIN DETEMIR 100 UNIT/ML ~~LOC~~ SOLN
10.0000 [IU] | Freq: Two times a day (BID) | SUBCUTANEOUS | Status: DC
  Filled 2019-11-10: qty 0.1

## 2019-11-10 MED ORDER — INSULIN DETEMIR 100 UNIT/ML ~~LOC~~ SOLN
10.0000 [IU] | Freq: Two times a day (BID) | SUBCUTANEOUS | Status: DC
  Administered 2019-11-10 – 2019-11-16 (×13): 10 [IU] via SUBCUTANEOUS
  Filled 2019-11-10 (×15): qty 0.1

## 2019-11-10 MED ORDER — VITAL 1.5 CAL PO LIQD
1000.0000 mL | ORAL | Status: DC
  Administered 2019-11-10: 1000 mL

## 2019-11-10 NOTE — Progress Notes (Signed)
[address]       [place] [address]             [phone]                 27 Days Post-Op Procedure(s) (LRB): XI ROBOTIC ASSISTED [place] ESOPHAGECTOMY USING NIMS (N/A) VIDEO BRONCHOSCOPY (N/A) JEJUNOSTOMY PLACEMENT (N/A) INTERCOSTAL NERVE BLOCK (Right)   Events: Free water was flush with the NG tube and the patient had an aspiration event. _______________________________________________________________ Vitals: BP 127/77    Pulse 78    Temp 97.7 F (36.5 C) (Axillary)    Resp (!) 23    Ht 5\' 2"  (1.575 m)    Wt 64.8 kg    SpO2 94%    BMI 26.13 kg/m   - Neuro: sedated. arousable  - Cardiovascular: Sinus  Drips: Levophed, and vasopressin    - Pulm:  Vent Mode: PRVC FiO2 (%):  [60 %-100 %] 60 % Set Rate:  [26 bmp] 26 bmp Vt Set:  [300 mL-350 mL] 300 mL PEEP:  [8 cmH20] 8 cmH20 Plateau Pressure:  [22 cmH20-23 cmH20] 23 cmH20  ABG    Component Value Date/Time   PHART 7.372 10/26/2019 0749   PCO2ART 54.4 (H) 10/26/2019 0749   PO2ART 105 10/26/2019 0749   HCO3 31.4 (H) 10/26/2019 0749   TCO2 33 (H) 10/26/2019 0749   ACIDBASEDEF 7.8 (H) 11/05/2019 2050   O2SAT 98.0 10/26/2019 0749    - Abd: soft.  J tube in place  - Extremity: warm  .Intake/Output      11/03 0701 - 11/04 0700 11/04 0701 - 11/05 0700   I.V. (mL/kg) 1135.8 (17.5)    Blood 365.4    Other 110    NG/GT 1186.7    IV Piggyback 431.5    Total Intake(mL/kg) 3229.4 (49.8)    Urine (mL/kg/hr) 2975 (1.9) 300 (0.6)   Stool     Chest Tube 30    Total Output 3005 300   Net +224.4 -300           _______________________________________________________________ Labs: CBC Latest Ref Rng & Units 11/10/2019 11/09/2019 11/07/2019  WBC 4.0 - 10.5 K/uL 14.4(H) 15.2(H) 19.7(H)  Hemoglobin 12.0 - 15.0 g/dL 8.8(L) 6.9(LL) 7.3(L)  Hematocrit 36 - 46 % 30.3(L) 24.5(L) 25.1(L)  Platelets 150 - 400 K/uL 316 276 261   CMP Latest Ref Rng & Units 11/10/2019 11/09/2019 11/08/2019  Glucose 70 - 99  mg/dL 140(H) 130(H) 123(H)  BUN 8 - 23 mg/dL 56(H) 78(H) 75(H)  Creatinine 0.44 - 1.00 mg/dL 0.67 0.66 0.67  Sodium 135 - 145 mmol/L 148(H) 148(H) 144  Potassium 3.5 - 5.1 mmol/L 4.4 3.5 3.4(L)  Chloride 98 - 111 mmol/L 94(L) 91(L) 85(L)  CO2 22 - 32 mmol/L 42(H) 43(H) 48(H)  Calcium 8.9 - 10.3 mg/dL 8.6(L) 8.4(L) 8.3(L)  Total Protein 6.5 - 8.1 g/dL - - -  Total Bilirubin 0.3 - 1.2 mg/dL - - -  Alkaline Phos 38 - 126 U/L - - -  AST 15 - 41 U/L - - -  ALT 0 - 44 U/L - - -      _______________________________________________________________  Assessment and Plan: POD 26 s/p Robotic 3 field esophagectomy.  Respiratory failure, cervical anastamotic leak.    Neuro: pain controlled CV: sinus, continue amio gtt for now. Back on low-dose pressors Pulm:tolerating TC.  Does have a small hole in the membranous portion of the trachea.  Will exchange trach for XLT in the  next few days Renal: Creatinine stable GI: Unable to increase tube feeds to goal thus will restart TPN.  We will keep NG tube to suction. Heme: stable ID: wet to dry dressing to cervical incision.  Antibiotics restarted.   Endo: SSI  Dispo: continue ICU care. family does not wish to make the patient DNR.  Melodie Bouillon, MD

## 2019-11-10 NOTE — Progress Notes (Signed)
Nutrition Follow-up  DOCUMENTATION CODES:   Severe malnutrition in context of chronic illness  INTERVENTION:   TPN per Pharmacy to meet 100% of patient's nutritional needs  TF via J-tube per MD at this time   NUTRITION DIAGNOSIS:   Severe Malnutrition related to chronic illness (COPD, esophageal cancer s/p chemoradiation) as evidenced by severe fat depletion, severe muscle depletion.  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 Presents this admission for surgical management of esophageal cancer.  10/09 - s/p esophagectomy, J-tube 10/12 - esophagram with no evidence of anastomotic leak, NGT removed, clear liquids 10/13 - tube feeds transitioned to nocturnal 10/14 - rapid response, CXR showing new right basilar pneumothorax and worsening airspace disease in LUL, chest tube placed back to suction 10/15 - intubated 10/17 - extubated 10/18 - reintubated, right-sided chest tube placed 10/19 - TF held due to drainage around J-tube site 10/23 - TF held again 10/24 - TPN initiated 10/27 - EGD with NG tube placed (tip at pylorus) 11/01 - TF increasing, switched back to J-tube from NG tube for feedings 11/02 - Trach placed, Bronch with tracheal fistula discovered 11/04 - Aspiration even after free water given per NG-tube, TF contents coming out from around J-tube, plan to resume TPN  Pt remains on vent support via trach; sedated with fentanyl and propofol (4 ml/hr). Currently on vasopressin and levophed  Pt with aspiration event after receiving free  water flush via NG tube. Free water flushed and liquid came out of trach, vomiting with respiratory decline  Per report, TF is coming out around J-tube insertion site; rate turned down by half by MD, currently at 20 ml/hr. NG to suction.  Pt also with white pale clay colored stool, concerning for possible malabsorption/maldigestion  Discussed with MDs and RN during rounds that if aggressive care is desired, recommend resuming TPN.   Reviewed MD Orvan Seen note this afternoon with family desiring that pt remain a Full Code and plans to resume TPN  D5 started for hypoglycemia management  Labs: sodium 148 (H), Creatinine wdl Meds: ss novolog, levemir BID, reglan  NUTRITION - FOCUSED PHYSICAL EXAM:    Most Recent Value  Orbital Region Severe depletion  Upper Arm Region Moderate depletion  Thoracic and Lumbar Region Moderate depletion  Buccal Region Severe depletion  Temple Region Severe depletion  Clavicle Bone Region Severe depletion  Clavicle and Acromion Bone Region Severe depletion  Scapular Bone Region Moderate depletion  Dorsal Hand Moderate depletion  Patellar Region Severe depletion  Anterior Thigh Region Severe depletion  Posterior Calf Region Unable to assess  Edema (RD Assessment) None  Hair Reviewed  Eyes Unable to assess  Mouth Unable to assess  Skin Reviewed  Nails Reviewed       Diet Order:   Diet Order    None      EDUCATION NEEDS:   Education needs have been addressed  Skin:  Skin Assessment: Skin Integrity Issues: Skin Integrity Issues:: Incisions, Stage II Stage II: bilateral buttocks Incisions: chest x 2, abdomen x 2, neck  Last BM:  11/4 rectal tube, white  clay colored stool  Height:   Ht Readings from Last 1 Encounters:  10/20/19 5\' 2"  (1.575 m)    Weight:   Wt Readings from Last 1 Encounters:  11/10/19 64.8 kg    BMI:  Body mass index is 26.13 kg/m.  Estimated Nutritional Needs:   Kcal:  1800-2000  Protein:  100-120  gram  Fluid:  >/= 1.8 L    Kerman Passey MS, RDN, LDN, CNSC Registered Dietitian III Clinical Nutrition RD Pager and On-Call Pager Number Located in [place]

## 2019-11-10 NOTE — Progress Notes (Signed)
NGT flushed, patient vomited, water came out trach through known TE fistula. Would probably stop all free water through NGT. ?just use percutaneous jejunostomy tube for this? Will leave to day team to discuss with TCTS.  D/C stat CXR, she's fine.  Erskine Emery MD PCCM

## 2019-11-10 NOTE — Progress Notes (Signed)
NAME:  [name], MRN:  [id number], DOB:  29-Jan-1950, LOS: 67 ADMISSION DATE:  11/04/2019, CONSULTATION DATE: 10/20/2019 REFERRING MD:  Dr. Kipp Brood, CHIEF COMPLAINT:  SOB   Brief History   Rachel Lore. Flores is a 69 y/o female with a PMHx of esophageal squamous cell carcinoma admitted on 10/08 for esophagectomy with interval development of respiratory distress requiring BiPAP and transfer to ICU.   History of present illness   [name] presented to [facility] for admission for elective esophagectomy on 10/08 that proceeded without any complications. She is currently POD 6.   Overnight, patient developed worsening work of breathing requiring NRB. She was noted to be diaphoretic with decreased lung sounds by the RN. Rapid response was called, who switched chest tube from water seal to suction. Air leak noted in [place]. CXR obtained overnight showed small right basilar pneumothorax with loculated pleural effusion, as well as interval development of extensive airspace infiltrate in the LUL. Repeat CXR this morning shows improvement in pneumothorax with no change in opacities. In the late morning, patient developed worsening respiratory distress requiring BiPAP placement and transfer to ICU for close monitoring.  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 Fib  Significant Hospital Events   10/08 >> Admitted to [facility] 10/14 >> Transferred to ICU for respiratory distress   Consults:  PCCM  Procedures:  10/08 > Esophagectomy 10/15 > ETT placed, Bronchoscopy     Significant Diagnostic Tests:  CXR 10/13 > Small right basilar pneumothorax, however, has increased in size since prior examination and there has now developed a small amount of laterally loculated pleural fluid. Extensive airspace infiltrate has now developed throughout the left upper lobe, likely infectious in the appropriate clinical setting.  CXR 10/14  > Right basilar pneumothorax is  smaller compared to earlier in the day. No tension component. Subcutaneous air present on the right. There is extensive airspace opacity throughout the left upper lobe and left perihilar regions, similar to earlier in the day. There is patchy atelectasis in the right mid lung and right base with suspected loculated pleural effusion on the right laterally ABD xray 10/20 > Mild diffuse air distension of the bowel, either reflecting ileus or low-grade obstruction.  Micro Data:  10/18 Pleural culture - moderate GNR> MODERATE ENTEROBACTER CLOACAE  MODERATE ENTEROBACTER AEROGENES  FEW PSEUDOMONAS AERUGINOSA  10/15 Respiratory culture - Pan-senstive PA Wound culture 10/18 >  10/21 blood> NG 10/30 resp> pseudomonas and enterobacter aerogenes 11/2> resp > few WBC, sent for reincubation >>   Antimicrobials:  Zosyn 10/14 >> 10/21 Cefepime 10/21 >10/28, 10/29>11/2 Flagyl 10/21 >10/24 Fluconazole 10/22> 10/27 Meropenem 11/2 >>  Interim history/subjective:  Overnight with noted aspiration with free water via NG tube.   Objective   Blood pressure (!) 127/44, pulse 76, temperature 98.1 F (36.7 C), temperature source Oral, resp. rate (!) 25, height 5\' 2"  (1.575 m), weight 64.8 kg, SpO2 98 %.    Vent Mode: PRVC FiO2 (%):  [70 %-100 %] 70 % Set Rate:  [26 bmp] 26 bmp Vt Set:  [300 mL-350 mL] 300 mL PEEP:  [8 cmH20] 8 cmH20 Plateau Pressure:  [22 cmH20-23 cmH20] 22 cmH20   Intake/Output Summary (Last 24 hours) at 11/10/2019 0952 Last data filed at 11/10/2019 0653 Gross per 24 hour  Intake 2992.19 ml  Output 2905 ml  Net 87.19 ml   Net for admission +3.0L  Filed Weights   11/08/19 0419 11/09/19 0500 11/10/19 0155  Weight: 66.3 kg 67.2 kg 64.8 kg   Physical Exam: General: critically ill appearing elderly woman. Lying in bed  HEENT: Trach in place > dry/clean, ng tube noted  Neuro: alert, does not follow commands, pupils intact, RASS -2  PULM: coarse breath sounds, rhonchi to upper.  GI:  soft, j-tube in place > with copious leakage of what appears to be TF Extremities: -edema Skin: intact    Resolved Hospital Problem list   AKI  Assessment & Plan:   Acute hypoxemic and hypercarbic respiratory failure  -Reintubated on 10/17. Family agreeable to trach when team is ready. However post-trach care poses a challenge due to the anastomotic leak. Left lung infiltrate persistent since 10/14, worsening. Hx of Asthma  Tracheostomy on 11/08/2019 P: -Continue low tidal volume ventilation, 4 to 8 cc/kg ideal body weight with goal plateau less than 30 and driving pressure less than 15.  Given asymmetric lung disease, would not tolerate high PEEP.  Titrate down FiO2 as able. -Daily SAT and SBT.  -VAP prevention protocol -Continue to follow trach aspirate culture  -Sedation as required to tolerate mechanical ventilation. (Currently on Propofol/Fentanyl gtt)   Septic shock- pneumonia, empyema -Multiple sources including loculated pleural effusion after anastomotic leak from esophagectomy Loculated empyema secondary to anastomotic leak into R pleural space. -S/p chest tubes for source control -Culture positive for Enterobacter 10/18  Aspiration Pneumonia/Pseudomonas aeruginosa HAP Right pneumothorax; ongoing communication with disrupted esophageal gastric anastomosis. 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 P: -Continue meropenem  -Continue vasopressors PRN to maintain MAP >65-- low dose norepi and vasopressin. -Continue chest tubes to suction; these will remain in place until extubated. No significant ongoing output. -Holding Diuretics today. Patient clinically appears dehydrated as well on 15 mcg levophed with vasopressin  -Planning for repeat bronchoscopy later this week to reevaluate location of fistula opening and balloon placement of tracheostomy tube.  Sinus bradycardia Atrial fibrillation- more  tachycardic P: -Continue amiodarone (changed to gtt due to question absorption, patient now back in A.Fib with HR 110)  -Con't tele monitoring -Continue Lovenox > hold starting eliqius   Esophageal Squamous Cell Carcinoma s/p Esophagectomy c/b anastomotic leak - Management of incision site per TCTS  P: -Enteral access via J-tube placed by cardiothoracic surgery -con't TF at goal, TPN stopped  Hyperglycemia- controlled.  -Continue Levemir Decrease due to hypoglycemia overnight.  -Sliding scale insulin as needed -Goal BG 140-180 while admitted to the ICU.  Resolving ileus  -ABD xray 10/20 with mild diffuse air distension of the bowel, either reflecting ileus or low-grade obstruction. P: -Tube feeds via J tube. Hold Free water as low absorption with J tube and Fistula/Aspiration with NG. Start dextrose  -Continue Reglan -Deescalate bowel regimen  Non-obstructive CAD LAD saccular aneurysm  HTN  P: -Continue holding PTA Plavix; holding Imdur due to hypotension -Continue daily aspirin  Hx of Hypothyroidism  P: -Continue levothyroxine  High TG, likely due to TPN and propofol P: -Re-started on propofol due to uncontrolled agitation. Follow TG levels  Chronic normocytic anemia-stable P: -Transfuse for hemoglobin less than 7 or hemodynamically significant bleeding -On Lovenox today due to tracheostomy  Worsening leukocytosis- unknown source of uncontrolled infection -Continue Vancomycin/Meropenem,  -Follow Culture Data   Hypernatremia -start Dextrose     Best practice:  Diet: Tube Feeds per G Tube , TPN Pain/Anxiety/Delirium protocol (if indicated): on Fentanyl/Propofol  VAP protocol (if indicated): yes DVT prophylaxis: full dose lovenox GI prophylaxis: Protonix Glucose control:  Basal  bolus insulin, SSI Mobility: up to chair Code Status: FULL Family Communication:   Disposition: ICU  This patient is critically ill with multiple organ system failure; which,  requires frequent high complexity decision making, assessment, support, evaluation, and titration of therapies. This was completed through the application of advanced monitoring technologies and extensive interpretation of multiple databases. During this encounter critical care time was devoted to patient care services described in this note for 38 minutes.  Hayden Pedro, AGACNP-BC [facility]  Pgr: [phone]  PCCM Pgr: [phone]

## 2019-11-10 NOTE — Progress Notes (Signed)
At approximately 0140, patient vomited copious clear liquid (soon after instilling free water via NG tube per [facility] order). Clear liquid also came out of trach at this time. RN suctioned patient's mouth and charge nurse to room and simultaneously suctioned trach. Pt began to desat and brady. RT called and to bedside. ELink notified and camera'd into room. Pt suctioned and bagged by RT until O2 saturation and HR stabilized. (Lowest sat visualized on monitor 48% and lowest heart rate in the high 40s.) During this time, tube feeds turned off (through JTube) and NG hooked to suction. ELink Ground Team to bedside, but patient had already stabilized. No further interventions. Tube feeds restarted via JTube and patient tolerating well.

## 2019-11-10 NOTE — Progress Notes (Signed)
eLink Physician-Brief Progress Note Patient Name: [name] DOB: [birth year] MRN: [id number]   Date of Service  11/10/2019  HPI/Events of Note  Nurse flushed NGT as ordered. Liquid came from ETT. Patient desaturated to 50's and became bradycardic. Sat now = 100% and HR = 67. BP = 106/40.   eICU Interventions  Plan: 1. Portable CXR STAT. 2. Will ask ground team to evaluate at bedside.      Intervention Category Major Interventions: Hypoxemia - evaluation and management  Lache Dagher [place] 11/10/2019, 1:46 AM

## 2019-11-10 NOTE — Progress Notes (Signed)
Patient ID: [name], female   DOB: 1950-08-26, 69 y.o.   MRN: [id number] TCTS Evening Rounds:  Hemodynamics stable on vaso 0.03 and NE 15 mcg.  Remains on vent with trach. Sedated with fentanyl and propofol drips.  TF per J tube. She pulled NG out tonight thrashing around in bed.  Urine output ok.

## 2019-11-10 NOTE — Progress Notes (Signed)
Physical Therapy Treatment Patient Details Name: [name] MRN: [id number] DOB: 02-26-1950 Today's Date: 11/10/2019    History of Present Illness 69 yo with history of esophageal CA admitted for esophagectomy with jejunostomy placement Pt with respiratory distress on 10/14, transferred to ICU and intubated on 10/15 with one failed attempt to extubate. Pt with anastamotic leak, NGT placed 11/02/19. Pt received trach on 11/09/2019.  Additional PMHx: CoPD, Asthma, HTN, HLD, hypothyroidism, CAD    PT Comments    Pt able to participate in session; pt more alert when sitting compared to supine; pt able to follow commands in sitting; pt continues to demonstrate extreme balance deficits requiring total assist at times, but able to maintain with min A for increased time; pt fatigued easily during session requiring to return supine; pt will continue to benefit from skilled PT to address deficits in balance, strength, coordination, bed mobility, safety and endurance to maximize independence with functional mobility prior to discharge.    Follow Up Recommendations  CIR;Supervision/Assistance - 24 hour     Equipment Recommendations  Other (comment) (TBD)    Recommendations for Other Services       Precautions / Restrictions Precautions Precautions: Fall Precaution Comments: 2 chest tubes, j tube, Vent with trach, flexiseal Restrictions Weight Bearing Restrictions: No    Mobility  Bed Mobility Overal bed mobility: Needs Assistance Bed Mobility: Supine to Sit;Sit to Supine     Supine to sit: HOB elevated;Mod assist;Max assist;+2 for physical assistance;+2 for safety/equipment Sit to supine: Mod assist;+2 for physical assistance;Max assist   General bed mobility comments: mod-max A for physical assistance and equipment management  Transfers                    Ambulation/Gait                 Stairs             Wheelchair Mobility    Modified Rankin (Stroke  Patients Only)       Balance Overall balance assessment: Needs assistance Sitting-balance support: No upper extremity supported;Feet unsupported Sitting balance-Leahy Scale: Poor Sitting balance - Comments: pt performed sitting EOB x 10 minutes wtih varying amounts of assist needed; pt initially requiring total assist due to increased posterior lean, with verbal and tactile cueing pt able to improve balance and maintain with min A x 15 seconds prior ot increased lateral lean; initiated LE exercises wtih increased posterior lean noted                                    Cognition                                              Exercises      General Comments General comments (skin integrity, edema, etc.): VSS Vent setting 60% Fi02 with Peep of 8; O2 remained 89% of greater during session      Pertinent Vitals/Pain Pain Assessment: No/denies pain    Home Living                      Prior Function            PT Goals (current goals can now be found in the care plan section) Acute Rehab PT Goals  Patient Stated Goal: return home PT Goal Formulation: With patient Time For Goal Achievement: 11/23/19 Potential to Achieve Goals: Good Progress towards PT goals: Progressing toward goals    Frequency    Min 3X/week      PT Plan Current plan remains appropriate    Co-evaluation              AM-PAC PT "6 Clicks" Mobility   Outcome Measure  Help needed turning from your back to your side while in a flat bed without using bedrails?: A Lot Help needed moving from lying on your back to sitting on the side of a flat bed without using bedrails?: A Lot Help needed moving to and from a bed to a chair (including a wheelchair)?: Total Help needed standing up from a chair using your arms (e.g., wheelchair or bedside chair)?: Total Help needed to walk in hospital room?: Total Help needed climbing 3-5 steps with a railing? : Total 6 Click  Score: 8    End of Session Equipment Utilized During Treatment: Oxygen Activity Tolerance: Patient limited by fatigue Patient left: in bed;with call bell/phone within reach;with nursing/sitter in room;with SCD's reapplied Nurse Communication: Mobility status PT Visit Diagnosis: Other abnormalities of gait and mobility (R26.89);Difficulty in walking, not elsewhere classified (R26.2);Muscle weakness (generalized) (M62.81)     Time: 2836-6294 PT Time Calculation (min) (ACUTE ONLY): 18 min  Charges:  $Therapeutic Activity: 8-22 mins                     Lyanne Co, DPT Acute Rehabilitation Services [phone]   [place] 11/10/2019, 2:19 PM

## 2019-11-11 DIAGNOSIS — G9340 Encephalopathy, unspecified: Secondary | ICD-10-CM

## 2019-11-11 DIAGNOSIS — A419 Sepsis, unspecified organism: Secondary | ICD-10-CM

## 2019-11-11 DIAGNOSIS — J9601 Acute respiratory failure with hypoxia: Secondary | ICD-10-CM | POA: Diagnosis not present

## 2019-11-11 LAB — GLUCOSE, CAPILLARY
Glucose-Capillary: 112 mg/dL — ABNORMAL HIGH (ref 70–99)
Glucose-Capillary: 118 mg/dL — ABNORMAL HIGH (ref 70–99)
Glucose-Capillary: 120 mg/dL — ABNORMAL HIGH (ref 70–99)
Glucose-Capillary: 138 mg/dL — ABNORMAL HIGH (ref 70–99)
Glucose-Capillary: 156 mg/dL — ABNORMAL HIGH (ref 70–99)
Glucose-Capillary: 91 mg/dL (ref 70–99)

## 2019-11-11 LAB — BASIC METABOLIC PANEL
Anion gap: 9 (ref 5–15)
BUN: 46 mg/dL — ABNORMAL HIGH (ref 8–23)
CO2: 38 mmol/L — ABNORMAL HIGH (ref 22–32)
Calcium: 7.8 mg/dL — ABNORMAL LOW (ref 8.9–10.3)
Chloride: 98 mmol/L (ref 98–111)
Creatinine, Ser: 0.48 mg/dL (ref 0.44–1.00)
GFR, Estimated: >60 mL/min (ref >60)
Glucose, Bld: 112 mg/dL — ABNORMAL HIGH (ref 70–99)
Potassium: 4 mmol/L (ref 3.5–5.1)
Sodium: 145 mmol/L (ref 135–145)

## 2019-11-11 LAB — CBC
HCT: 26 % — ABNORMAL LOW (ref 36.0–46.0)
Hemoglobin: 7.7 g/dL — ABNORMAL LOW (ref 12.0–15.0)
MCH: 29.3 pg (ref 26.0–34.0)
MCHC: 29.6 g/dL — ABNORMAL LOW (ref 30.0–36.0)
MCV: 98.9 fL (ref 80.0–100.0)
Platelets: 265 10*3/uL (ref 150–400)
RBC: 2.63 MIL/uL — ABNORMAL LOW (ref 3.87–5.11)
RDW: 17.2 % — ABNORMAL HIGH (ref 11.5–15.5)
WBC: 11 10*3/uL — ABNORMAL HIGH (ref 4.0–10.5)
nRBC: 0 % (ref 0.0–0.2)

## 2019-11-11 LAB — PHOSPHORUS: Phosphorus: 2.6 mg/dL (ref 2.5–4.6)

## 2019-11-11 LAB — MAGNESIUM: Magnesium: 1.9 mg/dL (ref 1.7–2.4)

## 2019-11-11 MED ORDER — TRAVASOL 10 % IV SOLN
INTRAVENOUS | Status: AC
  Filled 2019-11-11: qty 604.8

## 2019-11-11 NOTE — Progress Notes (Signed)
NAME:  [name], MRN:  [id number], DOB:  1950/04/29, LOS: 42 ADMISSION DATE:  10/23/2019, CONSULTATION DATE: 10/20/2019 REFERRING MD:  Dr. Kipp Brood, CHIEF COMPLAINT:  SOB   Brief History   [name]. Novella is a 69 y/o female with a PMHx of esophageal squamous cell carcinoma admitted on 10/08 for esophagectomy with interval development of respiratory distress requiring BiPAP and transfer to ICU.   History of present illness   [name] presented to [facility] for admission for elective esophagectomy on 10/08 that proceeded without any complications. She is currently POD 6.   Overnight, patient developed worsening work of breathing requiring NRB. She was noted to be diaphoretic with decreased lung sounds by the RN. Rapid response was called, who switched chest tube from water seal to suction. Air leak noted in [place]. CXR obtained overnight showed small right basilar pneumothorax with loculated pleural effusion, as well as interval development of extensive airspace infiltrate in the LUL. Repeat CXR this morning shows improvement in pneumothorax with no change in opacities. In the late morning, patient developed worsening respiratory distress requiring BiPAP placement and transfer to ICU for close monitoring.  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 Fib  Significant Hospital Events   10/08 >> Admitted to [facility] 10/14 >> Transferred to ICU for respiratory distress   Consults:  PCCM  Procedures:  10/08 > Esophagectomy 10/15 > ETT placed, Bronchoscopy     Significant Diagnostic Tests:  CXR 10/13 > Small right basilar pneumothorax, however, has increased in size since prior examination and there has now developed a small amount of laterally loculated pleural fluid. Extensive airspace infiltrate has now developed throughout the left upper lobe, likely infectious in the appropriate clinical setting.  CXR 10/14  > Right basilar pneumothorax is  smaller compared to earlier in the day. No tension component. Subcutaneous air present on the right. There is extensive airspace opacity throughout the left upper lobe and left perihilar regions, similar to earlier in the day. There is patchy atelectasis in the right mid lung and right base with suspected loculated pleural effusion on the right laterally ABD xray 10/20 > Mild diffuse air distension of the bowel, either reflecting ileus or low-grade obstruction.  Micro Data:  10/18 Pleural culture - moderate GNR> MODERATE ENTEROBACTER CLOACAE  MODERATE ENTEROBACTER AEROGENES  FEW PSEUDOMONAS AERUGINOSA  10/15 Respiratory culture - Pan-senstive PA Wound culture 10/18 >  10/21 blood> NG 10/30 resp> pseudomonas and enterobacter aerogenes 11/2> resp > rare pseudomonas aeruginosa, rare enterobacter aerogenes  Antimicrobials:  Zosyn 10/14 >> 10/21 Cefepime 10/21 >10/28, 10/29>11/2 Flagyl 10/21 >10/24 Fluconazole 10/22> 10/27 Meropenem 11/2 >>  Interim history/subjective:  Pulled out NGT  TF leakage from J tube There ar plans to restart TPN   WBC downtrending, now 11 from 14   Remains on vaso and NE  Objective   Blood pressure (!) 125/33, pulse 64, temperature 98.8 F (37.1 C), temperature source Oral, resp. rate (!) 28, height 5\' 2"  (1.575 m), weight 68.7 kg, SpO2 95 %.    Vent Mode: PRVC FiO2 (%):  [50 %-60 %] 50 % Set Rate:  [26 bmp] 26 bmp Vt Set:  [300 mL] 300 mL PEEP:  [8 cmH20] 8 cmH20 Plateau Pressure:  [19 cmH20-23 cmH20] 21 cmH20   Intake/Output Summary (Last 24 hours) at 11/11/2019 0957 Last data filed at 11/11/2019 0700 Gross per 24 hour  Intake 2901.59 ml  Output 575 ml  Net 2326.59 ml  Filed Weights   11/09/19 0500 11/10/19 0155 11/11/19 0426  Weight: 67.2 kg 64.8 kg 68.7 kg   Physical Exam: General: Frail, critically and chronically ill appearing older adult F, trach/vent NAD HEENT: Edentulous. Trach secure. Anicteric sclera. Dry mm. Temporal muscle  wasting  Neuro: Awake, alert, moving spontaneously. Tracking but not following commands  PULM: Symmetrical chest expansion, even respirations, mechanically ventilated. Coarse, with scattered rhochi  GI: Soft, round. Leakage around J tube with EN appearance Extremities: LUE PICC. No cyanosis or clubbing  Skin: Sacral wound with dressing intact. Pale, clean, dry. Scattered ecchymosis    Resolved Hospital Problem list   AKI  Assessment & Plan:   Esophageal Squamous Cell Carcinoma s/p Esophagectomy c/b anastomotic leak P: - per CVTS -- no plans for repeat EGD at this point   Acute encephalopathy -likely multifactorial: septic shock, CNS depressing medications, likely ICU delirium P -Delirium precautions -wean sedation as able -- without sufficient enteral access, continues on IV sedation -abx as below  Acute hypoxemic and hypercarbic respiratory failure  -Reintubated on 10/17. Family agreeable to trach when team is ready. However post-trach care poses a challenge due to the anastomotic leak. Left lung infiltrate persistent since 10/14, worsening. 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 Right pneumothorax; ongoing communication with disrupted esophageal gastric anastomosis. P: -continue low Vt ventilation.  -Wean FiO2 as able  -VAP prevention protocol -Continue chest tubes to suction; these will remain in place until extubated. No significant ongoing output. -Continue to follow trach aspirate culture -- susceptibilities pending  Septic shock- pneumonia, empyema -Multiple sources including loculated pleural effusion after anastomotic leak from esophagectomy Loculated empyema secondary to anastomotic leak into R pleural space. 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  Non-obstructive CAD LAD saccular aneurysm  HTN  P: -Continue holding PTA Plavix; holding Imdur due to hypotension -Continue daily aspirin  Hx of Hypothyroidism  P: -Continue levothyroxine  High TG, likely due to TPN and propofol P: -trend triglycerides  Chronic normocytic anemia-stable P: -Transfuse for hemoglobin less than 7 or hemodynamically significant bleeding -On Lovenox today due to tracheostomy  Sacral wound -local wound care -unfortunately, this likely will be difficult to heal given poor nutritional state and debility   Goals of Care -CVTS has consulted palliative care which I think is appropriate to establish goals of care. This is unfortunately not likely to be survivable   Best practice:  Diet: Tube Feeds per G Tube , TPN Pain/Anxiety/Delirium protocol (if indicated): on Fentanyl/Propofol  VAP protocol (if indicated): yes DVT prophylaxis: full dose lovenox GI prophylaxis: Protonix Glucose control:  Basal bolus insulin, SSI Mobility: up to chair Code Status: FULL Family Communication:  CVTS  Disposition: ICU  CRITICAL CARE Performed by: Cristal Generous   Total critical care time: 37 minutes  Critical care time was exclusive of separately billable procedures and treating other patients. Critical care was necessary to treat  or prevent imminent or life-threatening deterioration.  Critical care was time spent personally by me on the following activities: development of treatment plan with  patient and/or surrogate as well as nursing, discussions with consultants, evaluation of patient's response to treatment, examination of patient, obtaining history from patient or surrogate, ordering and performing treatments and interventions, ordering and review of laboratory studies, ordering and review of radiographic studies, pulse oximetry and re-evaluation of patient's condition.  Eliseo Gum MSN, AGACNP-BC [place] [phone] If no answer, [phone] 11/11/2019, 9:57 AM

## 2019-11-11 NOTE — Progress Notes (Signed)
Brief Nutrition Follow-up:  Noted palliative care to be consulted today. Noted poor prognosis, pt not likely to survive this hospitalization per MD notes.   Pt remains on vent support via trach, remains on pressors  TPN initiated today and has been ordered by Pharmacy. TF continues at trickle rate but TF and GI contents still leaking around J-tube site.  Noted pt pulled NG tube out. NG tube cannot be replaced without EGD with visulaization given anastomotic hole. No plans to insert NG at this time  Labs and meds reviewed  Interventions: Discussed nutrition poc with TPN Pharmacist. TPN to meet 100% of estimated nutritional needs; nutrition provided from trickle TF and protein modulars should NOT be included in calculations given concern for malabsorption/maldigestion (TF leaking out of J-tube site; stool is white, clay colored indicating possible malabsorption/maldiestion). Pharmacy plans to adjust TPN  Recommend considering holding TF at this time if significant output continues around J-tube site.   Kerman Passey MS, RDN, LDN, CNSC Registered Dietitian III Clinical Nutrition RD Pager and On-Call Pager Number Located in [place]

## 2019-11-11 NOTE — Progress Notes (Signed)
Physical Therapy Treatment Patient Details Name: [name] MRN: [id number] DOB: 1950-11-12 Today's Date: 11/11/2019    History of Present Illness 69 yo with history of esophageal CA admitted for esophagectomy with jejunostomy placement Pt with respiratory distress on 10/14, transferred to ICU and intubated on 10/15 with one failed attempt to extubate. Pt with anastamotic leak, NGT placed 11/02/19. Pt received trach on 11/09/2019.  Additional PMHx: CoPD, Asthma, HTN, HLD, hypothyroidism, CAD    PT Comments    Pt participated in session; pt requiring increased assist for bed mobility and sitting balance compared to previous session; pt more alert and able to move UEs to commands; pt with limited LE movement with commands and cueing; FiO2 60% with PEEP of 8, O2 dropped to 83% in sitting O2 boost given and pt returned supine with O2 increase to 91%; BP at start 136/97 after sitting 125/105, HR range 67-78 during session; pt continues to demonstrate deficits in balance, strength, coordination, bed mobility, safety and endurance and will benefit from skilled PT to address deficits to maximize independence with functional mobility prior to discharge.     Follow Up Recommendations  CIR;Supervision/Assistance - 24 hour     Equipment Recommendations  Other (comment) (TBD)    Recommendations for Other Services Rehab consult     Precautions / Restrictions Precautions Precautions: Fall Precaution Comments: 2 chest tubes, j tube, Vent with trach, flexiseal Restrictions Weight Bearing Restrictions: No    Mobility  Bed Mobility Overal bed mobility: Needs Assistance Bed Mobility: Supine to Sit;Sit to Supine Rolling: +2 for physical assistance;Total assist   Supine to sit: +2 for safety/equipment;+2 for physical assistance;Max assist Sit to supine: +2 for safety/equipment;+2 for physical assistance;Max assist      Transfers                    Ambulation/Gait                  Stairs             Wheelchair Mobility    Modified Rankin (Stroke Patients Only)       Balance       Sitting balance - Comments: performed sitting EOB with increased posterior and lateral lean R; therapist on L side attempting to provide tactile and visual cueing to correct posture, pt unable to self correct requiring max A to maintain balance, improved posterior lean noted with cueing to shift anteriorly                                    Cognition                                              Exercises General Exercises - Lower Extremity Heel Slides: PROM;Both;5 reps;Supine (pt initiated movement with LLE x 1 trial but unable to recreate additional reps or on R LE)    General Comments        Pertinent Vitals/Pain Pain Assessment: No/denies pain (no pain noted)    Home Living                      Prior Function            PT Goals (current goals can now be found in the care  plan section) Acute Rehab PT Goals Patient Stated Goal: return home PT Goal Formulation: With patient Time For Goal Achievement: 11/23/19 Potential to Achieve Goals: Fair Progress towards PT goals: Progressing toward goals (improved ability to answer questions during session and move UE's)    Frequency    Min 3X/week      PT Plan Current plan remains appropriate    Co-evaluation PT/OT/SLP Co-Evaluation/Treatment: Yes Reason for Co-Treatment: Complexity of the patient's impairments (multi-system involvement);For patient/therapist safety;Necessary to address cognition/behavior during functional activity PT goals addressed during session: Mobility/safety with mobility;Balance;Strengthening/ROM        AM-PAC PT "6 Clicks" Mobility   Outcome Measure  Help needed turning from your back to your side while in a flat bed without using bedrails?: Total Help needed moving from lying on your back to sitting on the side of a flat bed  without using bedrails?: A Lot Help needed moving to and from a bed to a chair (including a wheelchair)?: Total Help needed standing up from a chair using your arms (e.g., wheelchair or bedside chair)?: Total Help needed to walk in hospital room?: Total Help needed climbing 3-5 steps with a railing? : Total 6 Click Score: 7    End of Session Equipment Utilized During Treatment: Oxygen Activity Tolerance: Patient limited by fatigue Patient left: in bed;with call bell/phone within reach;with SCD's reapplied Nurse Communication: Mobility status PT Visit Diagnosis: Other abnormalities of gait and mobility (R26.89);Difficulty in walking, not elsewhere classified (R26.2);Muscle weakness (generalized) (M62.81)     Time: 8185-6314 PT Time Calculation (min) (ACUTE ONLY): 23 min  Charges:  $Therapeutic Exercise: 8-22 mins                     Lyanne Co, DPT Acute Rehabilitation Services [phone]   [place] 11/11/2019, 1:23 PM

## 2019-11-11 NOTE — Progress Notes (Addendum)
PHARMACY - TOTAL PARENTERAL NUTRITION CONSULT NOTE  Indication: Intolerance to EN / Prolonged ileus  Patient Measurements: Height: 5\' 2"  (157.5 cm) Weight: 68.7 kg (151 lb 7.3 oz) IBW/kg (Calculated) : 50.1 TPN AdjBW (KG): 56.8 Body mass index is 27.7 kg/m.  Weight = 73 kg on admit  Assessment:  89 YOF presented on 10/11/2019 for bronch, esophagectomy and J-tube placement due to esophageal cancer.  Patient was started on EN post-op on 10/15/19 and tolerated goal rate.  She also passed her swallow evaluation and started on a CLD with nocturnal TF.  She unfortunately developed right PTX and subsequently require intubation, transfer to the ICU, and chest tube placement.  TF has been interrupted frequently since 10/24/19 due to drainage around J-tube and suspected ileus vs pSBO.  Pharmacy consulted for TPN management.  11/4 Patient accidentally pulled out NGT; tube feeds leaking around Jtube sight - rate decreased to 20 ml/hr and stool appearance may represent malabsorption. Pharmacy reconsulted to restart TPN   Glucose / Insulin: no hx DM, A1c 5.8% - hypoglycemic this AM. Required 4 units SSI, Levemir 10 units BID (previously had insulin in TPN will hold off adding for now given low requirements of SSI) Electrolytes: Na 145, K 4 (goal >/= 4 for ileus), CL low and high CO2; phos 2.6 and Mag 1.9 (goal >/= 2 for ileus)  Renal: SCr 0.48, BUN 46  LFTs / TGs: LFTs / tbili WNL, TG 226 Prealbumin / albumin: prealbumin up to 16, albumin 1.6 Intake / Output; MIVF: UOP 0.4 ml/kg/hr, CT O/P 64mL, Jtube O/P 5mL GI Imaging:  10/24 CT abd: ?focal enteritis, postoperative ileus Surgeries / Procedures: none since TPN  Central access: PICC placed 10/21/19 TPN start date: 10/30/19  Nutritional Goals (per RD rec on 11/4): 1800-2000 kCal, 100-120gm protein, >/= 1.8L fluid per day  Current Nutrition:  TPN at goal rate of 75 ml/hr will meet 100% of protein needs and 85% of calorie needs - should be enough with  tube feeds and Prosource Vital AF 1.5 at 20 ml/hr (goal rate 50 ml/hr)  ProSource TF 45 ml TID (each provides 90kcal, 15g AA) - 3 charted given yesterday Propofol at 12.4 ml/hr (30 mcg/kg/min)  Plan:  Restart TPN at 40 ml/hr providing 60 g AA and 596 cals + 327 Kcal from propofol Electrolytes in TPN:no Na, K 14mEq/L (will not increase K+ in TPN further to account for Lasix - supplement outside of TPN as needed), Ca 63mEq/L, Mag 49mEq/L,Phos 18 mmol/L, change Cl:Ac ratio to 2:1 Add standard MVI and trace elements to TPN Continue CVTS SSI Q4H Continue Levemir 10 units SQ BID - will not add insulin to TPN due to low requirement of SSI F/U AM labs, volume status, CBGs, advance TPN as tolerated    Alanda Slim, PharmD, [facility] Clinical Pharmacist Please see AMION for all Pharmacists' Contact Phone Numbers 11/11/2019, 8:25 AM

## 2019-11-11 NOTE — Progress Notes (Signed)
Patient ID: [name], female   DOB: 28-Apr-1950, 69 y.o.   MRN: [id number] EVENING ROUNDS NOTE :     [address]       [place] [address]             [phone]                 28 Days Post-Op Procedure(s) (LRB): XI ROBOTIC ASSISTED [place] ESOPHAGECTOMY USING NIMS (N/A) VIDEO BRONCHOSCOPY (N/A) JEJUNOSTOMY PLACEMENT (N/A) INTERCOSTAL NERVE BLOCK (Right)  Total Length of Stay:  LOS: 28 days  BP (!) 123/40   Pulse 72   Temp 99.3 F (37.4 C) (Axillary)   Resp (!) 26   Ht 5\' 2"  (1.575 m)   Wt 68.7 kg   SpO2 91%   BMI 27.70 kg/m   .Intake/Output      11/04 0701 - 11/05 0700 11/05 0701 - 11/06 0700   I.V. (mL/kg) 2242 (32.6) 643 (9.4)   Blood     Other     NG/GT 469.7 180   IV Piggyback 300.1 58.8   Total Intake(mL/kg) 3011.7 (43.8) 881.8 (12.8)   Urine (mL/kg/hr) 775 (0.5)    Chest Tube 0    Total Output 775    Net +2236.7 +881.8          . sodium chloride 10 mL/hr at 11/07/19 2044  . amiodarone 30 mg/hr (11/11/19 1600)  . feeding supplement (VITAL 1.5 CAL) 1,000 mL (11/10/19 1115)  . fentaNYL infusion INTRAVENOUS 200 mcg/hr (11/11/19 1600)  . meropenem (MERREM) IV Stopped (11/11/19 1548)  . norepinephrine (LEVOPHED) Adult infusion 10 mcg/min (11/11/19 1600)  . propofol (DIPRIVAN) infusion 25 mcg/kg/min (11/11/19 1600)  . TPN ADULT (ION)    . vasopressin 0.03 Units/min (11/11/19 1600)     Lab Results  Component Value Date   WBC 11.0 (H) 11/11/2019   HGB 7.7 (L) 11/11/2019   HCT 26.0 (L) 11/11/2019   PLT 265 11/11/2019   GLUCOSE 112 (H) 11/11/2019   CHOL  01/20/2007    96        ATP III CLASSIFICATION:  <200     mg/dL   Desirable  200-239  mg/dL   Borderline High  >=240    mg/dL   High          TRIG 226 (H) 11/10/2019   HDL 28 (L) 01/20/2007   LDLCALC  01/20/2007    36        Total Cholesterol/HDL:CHD Risk Coronary Heart Disease Risk Table                     Men   Women  1/2 Average Risk   3.4   3.3  Average Risk       5.0    4.4  2 X Average Risk   9.6   7.1  3 X Average Risk  23.4   11.0        Use the calculated Patient Ratio above and the CHD Risk Table to determine the patient's CHD Risk.        ATP III CLASSIFICATION (LDL):  <100     mg/dL   Optimal  100-129  mg/dL   Near or Above                    Optimal  130-159  mg/dL   Borderline  160-189  mg/dL   High  >190     mg/dL   Very High  ALT 15 11/07/2019   AST 18 11/07/2019   NA 145 11/11/2019   K 4.0 11/11/2019   CL 98 11/11/2019   CREATININE 0.48 11/11/2019   BUN 46 (H) 11/11/2019   CO2 38 (H) 11/11/2019   TSH 2.153 06/12/2019   INR 1.3 (H) 10/11/2019   HGBA1C 5.8 (H) 10/11/2019   More nutritional status Very sedated currently Remains  on vent  Cancer Staging No matching staging information was found for the patient.    [name] MD  Beeper [phone] Office [phone] 11/11/2019 5:12 PM

## 2019-11-11 NOTE — Hospital Course (Addendum)
Admission Diagnoses:  Esophageal cancer Dyslipidemia  History of irritable bowel syndrome History of colon polyps Gastroesophageal reflux disease History of diverticulosis Chronic obstructive pulmonary disease History of asthma Type 2 diabetes mellitus History of atrial fibrillation History of acute kidney injury   Discharge Diagnoses:   Esophageal adenocarcinoma (HCC) S/P esophagectomy Dyslipidemia  History of irritable bowel syndrome History of colon polyps Gastroesophageal reflux disease History of diverticulosis Chronic obstructive pulmonary disease History of asthma Type 2 diabetes mellitus History of atrial fibrillation History of acute kidney injury Pressure injury of skin Acute hypoxemic respiratory failure (HCC)   Discharged Condition: {condition:18240}   History of Present Illness:    [name] 69 y.o. 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  She subsequently underwent neoadjuvant chemoradiation which per her report was completed in July of this year.  Over the course of her neoadjuvant therapy she lost approximately 24 pounds.  She currently has a PEG tube in place and has needed to resume tube feeds due to odynophagia and dysphagia.  She describes a burning sensation with all meals.  She also has regurgitation of food as well.  Hospital Course:  Ms. Los was admitted for elective same-day surgery on 11/06/2019.  She was prepared and taken to the operating room where bronchoscopy was performed followed by robotic assisted laparoscopy, robotic assisted thoracoscopy, McKeown esophagectomy, pyloroplasty, mini laparotomy for placement of a jejunostomy feeding tube, and intercostal nerve block were performed.  Please see the operative note for details.  Following the procedure, the patient was recovered in the postanesthesia care unit and  later transferred to [facility]  progressive care.  She was initially kept n.p.o. with an NG tube in place as well as a right chest tube and left neck Penrose drain.  On the first postoperative day, a simple contrast study was performed through the jejunostomy tube under fluoroscopy to assure patency and free flow of contrast distally into the jejunum.  The study showed appropriate function of the feeding tube so on the following day, tube feeding was initiated at a low rate and gradually increase to goal of 55 mL/h.  She tolerated this well.  She remained n.p.o. with the NG tube in for the next few days.  On postop day 3, the NG was removed.  Tube feeding was continued.  The patient was mobilized.  She had some respiratory distress early postoperatively that was felt to be related to oversedation from a PCA in conjunction with moderate volume excess.  The PCA was discontinued and she was diuresed with good response.  This resulted in significant improvement in both her mental status and respiratory status.  On postop day 4, an esophagram was performed that showed acceptable swallowing function with no evidence of anastomotic leak.  Patient was started on clear liquids by mouth which she tolerated well.  The chest tube was left in place for another 24 hours.  There was minimal output.  After consultation with the dietitian, we began gradual transition of the tube feeding from continuous 24-hour infusion to nocturnal feeding.  She had mild sinus tachycardia which was initially treated with the addition of metoprolol suspension by way of the G-tube.  On postop day 6, she again developed significant respiratory distress and worsening tachycardia.  Chest x-ray showed a new area of airspace disease in the left upper lobe that was very suspicious for aspiration  pneumonia.  She was started on broad-spectrum antibiotic coverage.  Will place patient on 9% nonrebreather and she had improvement in her oxygen saturation but she  continued to be tachypneic with a respiratory rate in the 40s and tachycardiac with a heart rate in the 130s.  We placed the patient on BiPAP and diurese her more aggressively with Lasix drip. Additionally, arterial line was placed for monitoring purposes as well as a PICC line to optimize vascular access. Supportive care continued. She was transferred back to the intensive care unit.  These maneuvers improve her respiratory status substantially and she appeared much more comfortable.  By the following morning, however, chest x-ray was showing worsening left upper lobe infiltrate.  The patient continued to desaturate very easily when off of BiPAP.  She was becoming more anxious.  A decision was made to proceed to intubation and mechanical ventilation.  She was transferred to the ICU on 10/21/19 and the critical care team was consulted. Antibiotic coverage was broadened with Flagyl and fluconazole. She developed septic shock and required vasopressor support for several days. After continuing mechanical ventilation for approximately 2 weeks without any significant improvement in her respiratory status a decision was made to proceed with percutaneous tracheostomy. This was performed by Dr. Kipp Brood while Dr. Noemi Chapel assisted with concurrent bronchoscopy. 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 Vasopressor and nutritional support continued along with antimicrobial coverage. Despite these efforts she made minimal progress and it became apparent that her prognosis was poor. This was discussed with the patient's daughter by Dr. Kipp Brood 11/11/19.  The family requested that we continue with full support and full code status.      Consults: pulmonary/intensive care   Diagnostic Studies & Laboratory data:  CT Scan: CT scan from July 2021.  There is marked thickening of the mid to distal esophagus involving 9 cm.  There is interval development of groundglass opacities in the right upper lobe  and superior segments of the right lower lobe.  She has a stable 3 mm left upper lobe nodule  PET/CT: Scan from May 18, 2019 was reviewed.  There is increased metabolic activity in the middle third of the esophagus with an SUV of 15.3.  The esophageal thickening begins at the level of the carina extends inferiorly over 5 cm.  Hypermetabolism terminates at the GE junction.  There is no hypermetabolic paraesophageal lymph nodes or mediastinal nodes.  There is a surgical anastomosis and the left abdomen which likely corresponds with her previous bowel resection in 2014.  EGD/EUS: Performed on 05/09/2019.  There is an elongated mid esophageal mass from 28 to 33 cm.  Path: Squamous cell cancer of the esophagus.   Treatments:   Operative Note   10/23/2019   Patient:  [name] Pre-Op Dx: Stage II mid esophageal squamous cancer Post-op Dx: Same Procedure: - Bronchoscopy  - Robotic assisted laparoscopy - Robotic assisted thoracoscopy - Mckeown esophagectomy - Pyloroplasty - Open jejunostomy tube placement 67F - Intercostal nerve block -    Surgeon and Role:      * Lightfoot, Lucile Crater, MD - Primary    * Dr. 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  She subsequently underwent neoadjuvant chemoradiation which per her report was completed in July of this year.  Over the course of her neoadjuvant therapy she lost approximately 24 pounds.  She currently has a PEG tube in place and has needed to resume tube feeds due to odynophagia and dysphagia.  She describes a burning sensation with all meals.  She also has regurgitation of food as well. Findings: On bronchoscopy, the  carina, left and right mainstem bronchus appeared normal.  There was no obvious tumor invasion.  Were able to mobilize the esophagus.  It was very adherent to the posterior surface of the trachea around level 7.  Great care was taken and we were able to fully mobilize the esophagus up to the thoracic inlet.     During laparoscopy there were some adhesions that were encountered upon entry.  Another 5 mm port was placed in the left upper quadrant we were then able to lyse all the abdominal adhesions so that we could place a robotic ports.  We created the gastric conduit without much difficulty.     When we went back down to place the jejunostomy tube the small bowel was matted.  We created a small supraumbilical incision to access the small bowel safely.  We were not able to pass the jejunostomy tube very far distally due to adhesions down of the pelvis, thus trimmed the jejunostomy tube so that it would fit appropriately.      Percutaneous Tracheostomy Procedure Note     EPHRATA VERVILLE  [id number]  17-Sep-1950   Date:11/08/19  Time:11:34 AM    Provider Performing:Harrell Jenetta Downer Lightfoot  Assistant: Dr. Noemi Chapel - performed the bronchoscopy   Procedure: Percutaneous Tracheostomy with Bronchoscopic Guidance (31600)  Indication(s) Respiratory failure.   Prolonged ventilation  Consent Risks of the procedure as well as the alternatives and risks of each were explained to the patient and/or caregiver.  Consent for the procedure was obtained.  Anesthesia Versed, Fentanyl,   Time Out Verified patient identification, verified procedure, site/side was marked, verified correct patient position, special equipment/implants available, medications/allergies/relevant history reviewed, required imaging and test results available.   Sterile Technique Maximal sterile technique including sterile barrier drape, hand hygiene, sterile gown, sterile gloves, mask, hair covering.    Procedure  Description Appropriate anatomy identified by palpation.  Patient's neck prepped and draped in sterile fashion.  1% lidocaine with epinephrine was used to anesthetize skin overlying neck.  1.5cm incision made and blunt dissection performed until tracheal rings could be easily palpated.   Then a size 43F Shiley tracheostomy was placed under bronchoscopic visualization using usual Seldinger technique and serial dilation.   Bronchoscope confirmed placement above the carina.  Tracheostomy was sutured in place with adhesive pad to protect skin under pressure.    Patient connected to ventilator.   Complications/Tolerance None; patient tolerated the procedure well. Chest X-ray is ordered to confirm no post-procedural complication.   EBL Minimal   Specimen(s) None

## 2019-11-11 NOTE — Progress Notes (Signed)
Pt with inconsistent response to yes/no questions with head nod and direction following, although alert. Assisted to sit EOB and performed neck, jaw and UE AAROM. Will continue to follow.   11/11/19 1333  OT Visit Information  Last OT Received On 11/11/19  Assistance Needed +2  PT/OT/SLP Co-Evaluation/Treatment Yes  Reason for Co-Treatment Complexity of the patient's impairments (multi-system involvement);For patient/therapist safety  OT goals addressed during session Strengthening/ROM  History of Present Illness 69 yo with history of esophageal CA admitted for esophagectomy with jejunostomy placement Pt with respiratory distress on 10/14, transferred to ICU and intubated on 10/15 with one failed attempt to extubate. Pt with anastamotic leak, NGT placed 11/02/19. Pt received trach on 11/09/2019.  Additional PMHx: CoPD, Asthma, HTN, HLD, hypothyroidism, CAD  Precautions  Precautions Fall  Precaution Comments 2 chest tubes, j tube, Vent with trach, flexiseal  Pain Assessment  Pain Assessment No/denies pain  Cognition  Arousal/Alertness Awake/alert  Behavior During Therapy Restless;Flat affect  Overall Cognitive Status Difficult to assess  General Comments vent with trach, inconsistent yes/no response and direction following  Difficult to assess due to Tracheostomy  ADL  General ADL Comments dependent  Bed Mobility  Overal bed mobility Needs Assistance  Bed Mobility Supine to Sit;Sit to Supine;Rolling  Rolling +2 for physical assistance;Total assist  Supine to sit +2 for physical assistance;Max assist  Sit to supine +2 for physical assistance;Max assist  Balance  Overall balance assessment Needs assistance  Sitting balance-Leahy Scale Zero  Transfers  General transfer comment deferred  General Comments  General comments (skin integrity, edema, etc.) facilitated closing mouth, tight jaw musculature  Exercises  Exercises General Upper Extremity  General Exercises - Upper Extremity   Shoulder Flexion Both;AAROM;10 reps;Supine  Elbow Flexion Both;AAROM;5 reps;Supine  Elbow Extension AAROM;Both;5 reps;Supine  Other Exercises  Other Exercises gentle neck AAROM   OT - End of Session  Activity Tolerance Treatment limited secondary to medical complications (Comment) (desats to 84% on vent)  Patient left in bed;with call bell/phone within reach;with restraints reapplied  Nurse Communication Other (comment) (response to therapy)  OT Assessment/Plan  OT Plan Discharge plan remains appropriate  OT Visit Diagnosis Muscle weakness (generalized) (M62.81)  OT Frequency (ACUTE ONLY) Min 2X/week  Follow Up Recommendations LTACH;Supervision/Assistance - 24 hour  OT Equipment Other (comment) (defer to next venue)  AM-PAC OT "6 Clicks" Daily Activity Outcome Measure (Version 2)  Help from another person eating meals? 1  Help from another person taking care of personal grooming? 1  Help from another person toileting, which includes using toliet, bedpan, or urinal? 1  Help from another person bathing (including washing, rinsing, drying)? 1  Help from another person to put on and taking off regular upper body clothing? 1  Help from another person to put on and taking off regular lower body clothing? 1  6 Click Score 6  OT Goal Progression  Progress towards OT goals Not progressing toward goals - comment (medical issues)  Acute Rehab OT Goals  OT Goal Formulation Patient unable to participate in goal setting  Time For Goal Achievement 11/23/19  Potential to Achieve Goals Fair  OT Time Calculation  OT Start Time (ACUTE ONLY) 1232  OT Stop Time (ACUTE ONLY) 1255  OT Time Calculation (min) 23 min  OT General Charges  $OT Visit 1 Visit  OT Treatments  $Therapeutic Activity 8-22 mins  Nestor Lewandowsky, OTR/L Acute Rehabilitation Services Pager: [phone] Office: [phone]

## 2019-11-11 NOTE — Progress Notes (Signed)
[address]       [place] [address]             [phone]                 28 Days Post-Op Procedure(s) (LRB): XI ROBOTIC ASSISTED MCKEOWN ESOPHAGECTOMY USING NIMS (N/A) VIDEO BRONCHOSCOPY (N/A) JEJUNOSTOMY PLACEMENT (N/A) INTERCOSTAL NERVE BLOCK (Right)   Events: Pulled NG tube overnight _______________________________________________________________ Vitals: BP (!) 125/33   Pulse 64   Temp 98.8 F (37.1 C) (Oral)   Resp (!) 28   Ht 5\' 2"  (1.575 m)   Wt 68.7 kg   SpO2 95%   BMI 27.70 kg/m   - Neuro: sedated. arousable  - Cardiovascular: Sinus  Drips: Levophed, and vasopressin    - Pulm:  Vent Mode: PRVC FiO2 (%):  [50 %-60 %] 50 % Set Rate:  [26 bmp] 26 bmp Vt Set:  [300 mL] 300 mL PEEP:  [8 cmH20] 8 cmH20 Plateau Pressure:  [19 cmH20-23 cmH20] 21 cmH20  ABG    Component Value Date/Time   PHART 7.372 10/26/2019 0749   PCO2ART 54.4 (H) 10/26/2019 0749   PO2ART 105 10/26/2019 0749   HCO3 31.4 (H) 10/26/2019 0749   TCO2 33 (H) 10/26/2019 0749   ACIDBASEDEF 7.8 (H) 10/18/2019 2050   O2SAT 98.0 10/26/2019 0749    - Abd: soft.  J tube in place  - Extremity: warm  .Intake/Output      11/04 0701 - 11/05 0700 11/05 0701 - 11/06 0700   I.V. (mL/kg) 2242 (32.6)    Blood     Other     NG/GT 469.7    IV Piggyback 300.1    Total Intake(mL/kg) 3011.7 (43.8)    Urine (mL/kg/hr) 775 (0.5)    Chest Tube 0    Total Output 775    Net +2236.7            _______________________________________________________________ Labs: CBC Latest Ref Rng & Units 11/11/2019 11/10/2019 11/09/2019  WBC 4.0 - 10.5 K/uL 11.0(H) 14.4(H) 15.2(H)  Hemoglobin 12.0 - 15.0 g/dL 7.7(L) 8.8(L) 6.9(LL)  Hematocrit 36 - 46 % 26.0(L) 30.3(L) 24.5(L)  Platelets 150 - 400 K/uL 265 316 276   CMP Latest Ref Rng & Units 11/11/2019 11/10/2019 11/09/2019  Glucose 70 - 99 mg/dL 112(H) 140(H) 130(H)  BUN 8 - 23 mg/dL 46(H) 56(H) 78(H)  Creatinine 0.44 - 1.00 mg/dL 0.48  0.67 0.66  Sodium 135 - 145 mmol/L 145 148(H) 148(H)  Potassium 3.5 - 5.1 mmol/L 4.0 4.4 3.5  Chloride 98 - 111 mmol/L 98 94(L) 91(L)  CO2 22 - 32 mmol/L 38(H) 42(H) 43(H)  Calcium 8.9 - 10.3 mg/dL 7.8(L) 8.6(L) 8.4(L)  Total Protein 6.5 - 8.1 g/dL - - -  Total Bilirubin 0.3 - 1.2 mg/dL - - -  Alkaline Phos 38 - 126 U/L - - -  AST 15 - 41 U/L - - -  ALT 0 - 44 U/L - - -      _______________________________________________________________  Assessment and Plan: POD 27 s/p Robotic 3 field esophagectomy.  Respiratory failure, cervical anastamotic leak.  Tracheal mucosal ulceration with fistula  Neuro: pain controlled CV: sinus, continue amio gtt for now. Back on pressors Pulm:tolerating TC.  Does have a small hole in the membranous portion of the trachea.  Will exchange trach for XLT in the next few days Renal: Creatinine stable GI: Unable to increase tube feeds to goal thus will restart TPN.  NG tube  can not be replaced without a formal EGD given the size of her anastomotic hole.  I am not inclined to perform another EGD at this point. Heme: stable ID: wet to dry dressing to cervical incision.  Antibiotics restarted.   Endo: SSI  Dispo: I had a long discussion with the patients daughter, and boyfriend in which I explained the gravity of her clinical status.  I further explained that she likely would not survive this hospitalization given her poor wound healing, and nutrition.  The family did not want to make her DNR, and still want everything done.  I will consult palliative care today.  Melodie Bouillon, MD

## 2019-11-12 ENCOUNTER — Inpatient Hospital Stay (HOSPITAL_COMMUNITY): Payer: Medicare Other

## 2019-11-12 DIAGNOSIS — J9620 Acute and chronic respiratory failure, unspecified whether with hypoxia or hypercapnia: Secondary | ICD-10-CM | POA: Diagnosis not present

## 2019-11-12 DIAGNOSIS — J9601 Acute respiratory failure with hypoxia: Secondary | ICD-10-CM | POA: Diagnosis not present

## 2019-11-12 DIAGNOSIS — Z515 Encounter for palliative care: Secondary | ICD-10-CM

## 2019-11-12 LAB — CULTURE, RESPIRATORY W GRAM STAIN

## 2019-11-12 LAB — DIFFERENTIAL
Abs Immature Granulocytes: 0.06 10*3/uL (ref 0.00–0.07)
Basophils Absolute: 0 10*3/uL (ref 0.0–0.1)
Basophils Relative: 0 %
Eosinophils Absolute: 0.1 10*3/uL (ref 0.0–0.5)
Eosinophils Relative: 2 %
Immature Granulocytes: 1 %
Lymphocytes Relative: 5 %
Lymphs Abs: 0.4 10*3/uL — ABNORMAL LOW (ref 0.7–4.0)
Monocytes Absolute: 0.7 10*3/uL (ref 0.1–1.0)
Monocytes Relative: 8 %
Neutro Abs: 6.8 10*3/uL (ref 1.7–7.7)
Neutrophils Relative %: 84 %

## 2019-11-12 LAB — CBC
HCT: 24.3 % — ABNORMAL LOW (ref 36.0–46.0)
Hemoglobin: 7.1 g/dL — ABNORMAL LOW (ref 12.0–15.0)
MCH: 29.2 pg (ref 26.0–34.0)
MCHC: 29.2 g/dL — ABNORMAL LOW (ref 30.0–36.0)
MCV: 100 fL (ref 80.0–100.0)
Platelets: 212 10*3/uL (ref 150–400)
RBC: 2.43 MIL/uL — ABNORMAL LOW (ref 3.87–5.11)
RDW: 16.5 % — ABNORMAL HIGH (ref 11.5–15.5)
WBC: 8.1 10*3/uL (ref 4.0–10.5)
nRBC: 0 % (ref 0.0–0.2)

## 2019-11-12 LAB — PREALBUMIN: Prealbumin: 12.5 mg/dL — ABNORMAL LOW (ref 18–38)

## 2019-11-12 LAB — GLUCOSE, CAPILLARY
Glucose-Capillary: 142 mg/dL — ABNORMAL HIGH (ref 70–99)
Glucose-Capillary: 153 mg/dL — ABNORMAL HIGH (ref 70–99)
Glucose-Capillary: 158 mg/dL — ABNORMAL HIGH (ref 70–99)
Glucose-Capillary: 169 mg/dL — ABNORMAL HIGH (ref 70–99)
Glucose-Capillary: 170 mg/dL — ABNORMAL HIGH (ref 70–99)
Glucose-Capillary: 174 mg/dL — ABNORMAL HIGH (ref 70–99)

## 2019-11-12 LAB — TRIGLYCERIDES
Triglycerides: 711 mg/dL — ABNORMAL HIGH (ref <150)
Triglycerides: 616 mg/dL — ABNORMAL HIGH (ref <150)

## 2019-11-12 LAB — COMPREHENSIVE METABOLIC PANEL
ALT: 13 U/L (ref 0–44)
AST: 13 U/L — ABNORMAL LOW (ref 15–41)
Albumin: 1.4 g/dL — ABNORMAL LOW (ref 3.5–5.0)
Alkaline Phosphatase: 48 U/L (ref 38–126)
Anion gap: 9 (ref 5–15)
BUN: 40 mg/dL — ABNORMAL HIGH (ref 8–23)
CO2: 36 mmol/L — ABNORMAL HIGH (ref 22–32)
Calcium: 7.4 mg/dL — ABNORMAL LOW (ref 8.9–10.3)
Chloride: 96 mmol/L — ABNORMAL LOW (ref 98–111)
Creatinine, Ser: 0.47 mg/dL (ref 0.44–1.00)
GFR, Estimated: >60 mL/min (ref >60)
Glucose, Bld: 215 mg/dL — ABNORMAL HIGH (ref 70–99)
Potassium: 3.7 mmol/L (ref 3.5–5.1)
Sodium: 141 mmol/L (ref 135–145)
Total Bilirubin: 0.9 mg/dL (ref 0.3–1.2)
Total Protein: 5.2 g/dL — ABNORMAL LOW (ref 6.5–8.1)

## 2019-11-12 LAB — CARBAPENEM RESISTANCE PANEL
Carba Resistance IMP Gene: NOT DETECTED
Carba Resistance KPC Gene: NOT DETECTED
Carba Resistance NDM Gene: NOT DETECTED
Carba Resistance OXA48 Gene: NOT DETECTED
Carba Resistance VIM Gene: NOT DETECTED

## 2019-11-12 LAB — MAGNESIUM: Magnesium: 2 mg/dL (ref 1.7–2.4)

## 2019-11-12 LAB — PHOSPHORUS: Phosphorus: 2.5 mg/dL (ref 2.5–4.6)

## 2019-11-12 MED ORDER — TRAVASOL 10 % IV SOLN
INTRAVENOUS | Status: AC
  Filled 2019-11-12: qty 1190.4

## 2019-11-12 MED ORDER — POTASSIUM CHLORIDE 20 MEQ PO PACK
20.0000 meq | PACK | ORAL | Status: AC
  Administered 2019-11-12 (×3): 20 meq
  Filled 2019-11-12 (×3): qty 1

## 2019-11-12 MED ORDER — QUETIAPINE FUMARATE 25 MG PO TABS
25.0000 mg | ORAL_TABLET | Freq: Two times a day (BID) | ORAL | Status: DC
  Administered 2019-11-12 (×2): 25 mg
  Filled 2019-11-12 (×2): qty 1

## 2019-11-12 MED ORDER — FENTANYL 75 MCG/HR TD PT72
1.0000 | MEDICATED_PATCH | TRANSDERMAL | Status: DC
  Administered 2019-11-12 – 2019-11-15 (×2): 1 via TRANSDERMAL
  Filled 2019-11-12 (×2): qty 1

## 2019-11-12 NOTE — Progress Notes (Signed)
[place] for lovenox Indication: atrial fibrillation  Allergies  Allergen Reactions  . Honey Bee Venom Protein [Bee Venom] Anaphylaxis  . Ether Nausea And Vomiting  . Nickel Other (See Comments)    infection  . Other Other (See Comments)    Pt reports that she cannot have staples placed due to infection.  . Tape Rash    Patient states that she has an allergic reaction to certain tapes. She states that she can tolerate paper tape.    Patient Measurements: Height: 5\' 2"  (157.5 cm) Weight: 69.1 kg (152 lb 5.4 oz) IBW/kg (Calculated) : 50.1  Vital Signs: Temp: 98.9 F (37.2 C) (11/06 0741) Temp Source: Axillary (11/06 0741) BP: 115/33 (11/06 1015) Pulse Rate: 65 (11/06 1015)  Labs: Recent Labs    11/10/19 0427 11/10/19 0427 11/11/19 0302 11/12/19 0312  HGB 8.8*   < > 7.7* 7.1*  HCT 30.3*  --  26.0* 24.3*  PLT 316  --  265 212  CREATININE 0.67  --  0.48 0.47   < > = values in this interval not displayed.    Estimated Creatinine Clearance: 59.6 mL/min (by C-G formula based on SCr of 0.47 mg/dL).   Medical History: Past Medical History:  Diagnosis Date  . Asthma   . Cancer ([facility])    esophageal cancer  . COPD (chronic obstructive pulmonary disease) ([facility])   . Coronary artery disease   . Depression   . Diabetes mellitus without complication ([place])    Type II  . Diastolic dysfunction   . Diverticulosis   . Fatty liver   . GERD without esophagitis   . Hx of Clostridium difficile infection   . Hx of colonic polyps   . Hx of small bowel obstruction   . Hypertension, benign   . Hypothyroidism   . Irritable bowel syndrome   . Left bundle branch block   . Lumbar herniated disc   . Mixed hyperlipidemia   . Osteoarthritis   . PAF (paroxysmal atrial fibrillation) ([facility])    06/2019  . Pneumonia   . Sleep apnea    Uses O2 at bedtime on occasion    Assessment: 69 yo F s/p esophagectomy with history of atrial fibrillation. Was  on Xarelto PTA. Patient was receiving Eliquis since Xarelto cannot go down J tube (last Eliquis dose 10/24 ~2200), but J tube is no longer working. Patient was switched to Lovenox and NG tube placed 10/27. Hgb down from 7.7 to 7.1 today, plts wnl. No overt bleeding per discussion with nursing.   Goal of Therapy:  Anti-Xa ([facility]) peak level 0.6-1.0 units/mL Monitor platelets by anticoagulation protocol: Yes   Plan:   Continue Enoxaparin 60 mg Q12h Monitor CBC, s/sx bleeding F/u ability to switch back to Eliquis  Richardine Service, PharmD PGY2 Cardiology Pharmacy Resident Phone: [phone] 11/12/2019  10:24 AM  Please check [email or web address] for unit-specific pharmacy phone numbers.

## 2019-11-12 NOTE — Progress Notes (Signed)
NAME:  [name], MRN:  [id number], DOB:  10-09-1950, LOS: 24 ADMISSION DATE:  10/23/2019, CONSULTATION DATE: 10/20/2019 REFERRING MD:  Dr. Kipp Brood, CHIEF COMPLAINT:  SOB   Brief History   [name]. Finger is a 69 y/o female with a PMHx of esophageal squamous cell carcinoma admitted on 10/08 for esophagectomy with interval development of respiratory distress requiring BiPAP and transfer to ICU.   History of present illness   Mrs. Aarthi presented to [facility] for admission for elective esophagectomy on 10/08 that proceeded without any complications. She is currently POD 6.   Overnight, patient developed worsening work of breathing requiring NRB. She was noted to be diaphoretic with decreased lung sounds by the RN. Rapid response was called, who switched chest tube from water seal to suction. Air leak noted in [place]. CXR obtained overnight showed small right basilar pneumothorax with loculated pleural effusion, as well as interval development of extensive airspace infiltrate in the LUL. Repeat CXR this morning shows improvement in pneumothorax with no change in opacities. In the late morning, patient developed worsening respiratory distress requiring BiPAP placement and transfer to ICU for close monitoring.  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 Fib  Significant Hospital Events   10/08 >> Admitted to [facility] 10/14 >> Transferred to ICU for respiratory distress   Consults:  PCCM  Procedures:  10/08 > Esophagectomy 10/15 > ETT placed, Bronchoscopy     Significant Diagnostic Tests:  CXR 10/13 > Small right basilar pneumothorax, however, has increased in size since prior examination and there has now developed a small amount of laterally loculated pleural fluid. Extensive airspace infiltrate has now developed throughout the left upper lobe, likely infectious in the appropriate clinical setting.  CXR 10/14  > Right basilar pneumothorax is  smaller compared to earlier in the day. No tension component. Subcutaneous air present on the right. There is extensive airspace opacity throughout the left upper lobe and left perihilar regions, similar to earlier in the day. There is patchy atelectasis in the right mid lung and right base with suspected loculated pleural effusion on the right laterally ABD xray 10/20 > Mild diffuse air distension of the bowel, either reflecting ileus or low-grade obstruction.  Micro Data:  10/18 Pleural culture - moderate GNR> MODERATE ENTEROBACTER CLOACAE  MODERATE ENTEROBACTER AEROGENES  FEW PSEUDOMONAS AERUGINOSA  10/15 Respiratory culture - Pan-senstive PA Wound culture 10/18 >  10/21 blood> NG 10/30 resp> pseudomonas and enterobacter aerogenes 11/2> resp > rare pseudomonas aeruginosa, rare enterobacter aerogenes  Antimicrobials:  Zosyn 10/14 >> 10/21 Cefepime 10/21 >10/28, 10/29>11/2 Flagyl 10/21 >10/24 Fluconazole 10/22> 10/27 Meropenem 11/2 >>  Interim history/subjective:  TPN started yesterday  This morning, trigs are elevated. Repeat level pending. May need to dc prop   Hgb 7.1 Objective   Blood pressure (!) 132/41, pulse 69, temperature 98.9 F (37.2 C), temperature source Axillary, resp. rate (!) 28, height 5\' 2"  (1.575 m), weight 69.1 kg, SpO2 93 %.    Vent Mode: PRVC FiO2 (%):  [60 %] 60 % Set Rate:  [26 bmp] 26 bmp Vt Set:  [300 mL] 300 mL PEEP:  [8 cmH20] 8 cmH20 Plateau Pressure:  [15 cmH20-26 cmH20] 26 cmH20   Intake/Output Summary (Last 24 hours) at 11/12/2019 0855 Last data filed at 11/12/2019 0800 Gross per 24 hour  Intake 2920.31 ml  Output 1530 ml  Net 1390.31 ml    Filed Weights   11/10/19 0155 11/11/19 0426  11/12/19 0416  Weight: 64.8 kg 68.7 kg 69.1 kg   Physical Exam: General: Critically ill, elderly, frail  Trach/vent multiple chest tubes.  HEENT: Edentulous. Significant temporal muscle wasting/ trach secure Neuro: Awake. Not following commands. Moving  spontaneously  PULM: Symmetrical chest expansion. Scattered Rhonchi. Coarse throughout  GI: Soft round. leaking J tube. Rectal tube collecting fluid that looks largely like EN with some fecal material.  Extremities: Muscle wasting BUE BLE. LUE PICC  Skin: Pale skin. Dry. Sacral wound with dressing intact. Tunneling neck wound adjacent to tracheostomy. Scattered ecchymosis.    Resolved Hospital Problem list   AKI Bradycardia  Assessment & Plan:   Esophageal Squamous Cell Carcinoma s/p Esophagectomy c/b anastomotic leak P: - per CVTS -- no plans for repeat EGD at this point   Acute encephalopathy -likely multifactorial: septic shock, CNS depressing medications, likely ICU delirium P -Delirium precautions -wean sedation as able--continues on IV sedation. Will plan to wean or dc prop. Will trial addition of enteral seroquel however it is unclear if this will be absorbed well. -abx as below  Acute hypoxemic and hypercarbic respiratory failure  -Reintubated on 10/17. Family agreeable to trach when team is ready. However post-trach care poses a challenge due to the anastomotic leak. Left lung infiltrate persistent since 10/14, worsening. 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 Right pneumothorax; ongoing communication with disrupted esophageal gastric anastomosis. P: -continue low Vt ventilation. SBT as tolerated  -Wean FiO2 as able  -VAP prevention protocol -Chest tubes to CVTS  -Continue to follow trach aspirate culture -- susceptibilities pending   Septic shock- pneumonia, empyema -Multiple sources including loculated pleural effusion after anastomotic leak from esophagectomy Loculated empyema secondary to anastomotic leak into R pleural space. 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  Non-obstructive CAD LAD saccular aneurysm  HTN  P: -holding PTA Plavix -Continue daily aspirin -holding antihypertensives in setting of shock   Hx of Hypothyroidism  P: -Continue levothyroxine  High TG, likely due to TPN and propofol P: -trend triglycerides -- weaning prop with hope to dc 11/6  Sacral wound -local wound care -unfortunately, this is unlikely to heal given poor nutritional state and debility, and may worsen despite best efforts   Goals of Care -CVTS has consulted palliative care which I think is appropriate to establish goals of care.  -This is unfortunately not likely to be survivable and I worry that our current efforts may be causing more harm than benefit    Best practice:  Diet: TPN and trickle via Jtube  Pain/Anxiety/Delirium protocol (if indicated): on Fentanyl/Propofol  VAP protocol (if indicated): yes DVT prophylaxis: full dose  lovenox GI prophylaxis: Protonix Glucose control:  Basal bolus insulin, SSI Mobility: up to chair Code Status: FULL Family Communication:  CVTS  Disposition: ICU  CRITICAL CARE Performed by: Cristal Generous   Total critical care time: 36 minutes  Critical care time was exclusive of separately billable procedures and  treating other patients. Critical care was necessary to treat or prevent imminent or life-threatening deterioration.  Critical care was time spent personally by me on the following activities: development of treatment plan with patient and/or surrogate as well as nursing, discussions with consultants, evaluation of patient's response to treatment, examination of patient, obtaining history from patient or surrogate, ordering and performing treatments and interventions, ordering and review of laboratory studies, ordering and review of radiographic studies, pulse oximetry and re-evaluation of patient's condition.  Eliseo Gum MSN, AGACNP-BC [place] [phone] If no answer, [phone] 11/12/2019, 8:55 AM

## 2019-11-12 NOTE — Progress Notes (Signed)
PHARMACY - TOTAL PARENTERAL NUTRITION CONSULT NOTE  Indication: Intolerance to EN / Prolonged ileus  Patient Measurements: Height: 5\' 2"  (157.5 cm) Weight: 69.1 kg (152 lb 5.4 oz) IBW/kg (Calculated) : 50.1 TPN AdjBW (KG): 56.8 Body mass index is 27.86 kg/m.  Weight = 73 kg on admit  Assessment:  3 YOF presented on 10/17/2019 for bronch, esophagectomy and J-tube placement due to esophageal cancer.  Patient was started on EN post-op on 10/15/19 and tolerated goal rate.  She also passed her swallow evaluation and started on a CLD with nocturnal TF.  She unfortunately developed right PTX and subsequently require intubation, transfer to the ICU, and chest tube placement.  TF has been interrupted frequently since 10/24/19 due to drainage around J-tube and suspected ileus vs pSBO.  Pharmacy consulted for TPN management.  11/4 Patient accidentally pulled out NGT; tube feeds leaking around Jtube sight - rate decreased to 20 ml/hr and stool appearance may represent malabsorption. Pharmacy reconsulted to restart TPN   Glucose / Insulin: no hx DM, A1c 5.8% - CBGs/12h: 130-160 Required 7 units SSI, Levemir 10 units BID (previously had insulin in TPN will hold off adding for now given low requirements of SSI) Electrolytes: Na 141, K 3.7 (goal >/= 4 for ileus) - 20 mEq PT q4h x 3 ordered for replacement, CL low and high CO2; phos 2.5 and Mag 2 (goal >/= 2 for ileus)  Renal: SCr 0.47, BUN 40 LFTs / TGs: LFTs / tbili WNL, TG 711 - recheck confirmed high at 616. Will hold lipids in TPN, stopping Propofol Prealbumin / albumin: Prealbumin 12.5 (11/6) << 16 (11/1), alb 1.4 Intake / Output; MIVF: UOP 0.7 ml/kg/hr, CT O/P 61mL, Jtube O/P 9mL, Stool/24h: 400 cc. NGT pulled out GI Imaging:  10/24 CT abd: ?focal enteritis, postoperative ileus Surgeries / Procedures: none since TPN  Central access: PICC placed 10/21/19 TPN start date: 10/30/19  Nutritional Goals (per RD rec on 11/4): 1800-2000 kCal, 100-120gm  protein, >/= 1.8L fluid per day  Current Nutrition:  TPN at 40 cc/hr provides 60 g AA and 596 cals Propofol @ 25 mcg/kg/min (10 ml/hr) provides ~240 kcal of lipids  Per discussion with RD on 11/5 - will plan to meet 100% of nutritional needs with TPN due to leaking around J-tube as concern for malabsorption/maldigestion expressed per the RD  Plan:  Increase TPN to 80 cc/hr to provide ~1800 kcal and ~119g protein meeting 100% of estimated needs Lipids will be removed due to TG>500  Electrolytes in TPN:no Na, K 8mEq/L (will not increase K+ in TPN further to account for Lasix - supplement outside of TPN as needed), Ca 29mEq/L, Mag 39mEq/L,Phos 18 mmol/L, Cl:Ac ratio to max chloride Add standard MVI and trace elements to TPN Continue CVTS SSI Q4H Continue Levemir 10 units SQ BID - will not add insulin to TPN due to low requirement of SSI F/U AM labs, volume status, CBGs, advance TPN as tolerated    Thank you for allowing pharmacy to be a part of this patient's care.  Alycia Rossetti, PharmD, BCPS Clinical Pharmacist Clinical phone for 11/12/2019: [phone] 11/12/2019 8:10 AM   **Pharmacist phone directory can now be found on [email or web address] ([email or web address]).  Listed under [place].

## 2019-11-12 NOTE — Progress Notes (Signed)
Patient ID: [name], female   DOB: [birth year], 69 y.o.   MRN: [id number] EVENING ROUNDS NOTE :     [place]       [place] [id number]             [phone]                 29 Days Post-Op Procedure(s) (LRB): XI ROBOTIC ASSISTED [place] ESOPHAGECTOMY USING NIMS (N/A) VIDEO BRONCHOSCOPY (N/A) JEJUNOSTOMY PLACEMENT (N/A) INTERCOSTAL NERVE BLOCK (Right)  Total Length of Stay:  LOS: 29 days  BP (!) 135/36   Pulse 72   Temp 99.5 F (37.5 C) (Axillary)   Resp 20   Ht 5\' 2"  (1.575 m)   Wt 69.1 kg   SpO2 95%   BMI 27.86 kg/m   .Intake/Output      11/05 0701 - 11/06 0700 11/06 0701 - 11/07 0700   I.V. (mL/kg) 2164.5 (31.3) 1074.5 (15.5)   Other  210   NG/GT 500 200   IV Piggyback 258.8 99.9   Total Intake(mL/kg) 2923.4 (42.3) 1584.4 (22.9)   Urine (mL/kg/hr) 1090 (0.7) 545 (0.7)   Stool 400 400   Chest Tube 10 20   Total Output 1500 965   Net +1423.4 +619.4          . sodium chloride 10 mL/hr at 11/07/19 2044  . amiodarone 30 mg/hr (11/12/19 1800)  . feeding supplement (VITAL 1.5 CAL) 1,000 mL (11/10/19 1115)  . fentaNYL infusion INTRAVENOUS 200 mcg/hr (11/12/19 1800)  . meropenem (MERREM) IV Stopped (11/12/19 1420)  . norepinephrine (LEVOPHED) Adult infusion 8 mcg/min (11/12/19 1800)  . propofol (DIPRIVAN) infusion Stopped (11/12/19 1112)  . TPN ADULT (ION) 80 mL/hr at 11/12/19 1800  . vasopressin Stopped (11/12/19 1401)     Lab Results  Component Value Date   WBC 8.1 11/12/2019   HGB 7.1 (L) 11/12/2019   HCT 24.3 (L) 11/12/2019   PLT 212 11/12/2019   GLUCOSE 215 (H) 11/12/2019   CHOL  01/20/2007    96        ATP III CLASSIFICATION:  <200     mg/dL   Desirable  200-239  mg/dL   Borderline High  >=240    mg/dL   High          TRIG 616 (H) 11/12/2019   HDL 28 (L) 01/20/2007   LDLCALC  01/20/2007    36        Total Cholesterol/HDL:CHD Risk Coronary Heart Disease Risk Table                     Men   Women  1/2 Average Risk   3.4    3.3  Average Risk       5.0   4.4  2 X Average Risk   9.6   7.1  3 X Average Risk  23.4   11.0        Use the calculated Patient Ratio above and the CHD Risk Table to determine the patient's CHD Risk.        ATP III CLASSIFICATION (LDL):  <100     mg/dL   Optimal  100-129  mg/dL   Near or Above                    Optimal  130-159  mg/dL   Borderline  160-189  mg/dL   High  >190     mg/dL   Very High  ALT 13 11/12/2019   AST 13 (L) 11/12/2019   NA 141 11/12/2019   K 3.7 11/12/2019   CL 96 (L) 11/12/2019   CREATININE 0.47 11/12/2019   BUN 40 (H) 11/12/2019   CO2 36 (H) 11/12/2019   TSH 2.153 06/12/2019   INR 1.3 (H) 10/11/2019   HGBA1C 5.8 (H) 10/11/2019   No changes today Mental status has not improved off propofol   [name] MD  Beeper [phone] Office [phone] 11/12/2019 6:56 PM

## 2019-11-12 NOTE — Consult Note (Signed)
Palliative Medicine   Name: [name] Date: 11/12/2019 MRN: [id number]  DOB: 04-28-1950  Patient Care Team: Martinique, Sarah T, MD as PCP - General (Family Medicine) Berniece Salines, DO as PCP - Cardiology (Cardiology) Marice Potter, MD as Consulting Physician (Oncology)    REASON FOR CONSULTATION: [name] is a 69 y.o. female with multiple medical problems including stage II squamous cell esophageal cancer (diagnosed 04/2019) status post neoadjuvant chemoradiation completed in July, status post PEG (05/27/19), history of tobacco abuse, CAD, diastolic dysfunction, PAF, diabetes, COPD with nocturnal O2, and OSA.  Patient was admitted to the hospital on 11/06/2019 for elective esophagectomy and J-tube placement.  Unfortunately, she subsequently developed hypoxic respiratory failure, PTX, and an anastomotic leak ultimately requiring a chest tube.  On 10/14 (postop day 5) she was transferred to the ICU for BiPAP and was subsequently intubated on 10/15.  Patient was extubated on 10/18 but required reintubation same day.  Unfortunately, her hospitalization has been further complicated sepsis, possible aspiration,  development of an R. sided empyema, J tube leak, ileus, sacral wound, and tracheal fistula. She had tracheostomy placed on 11/2.  Patient has been unable to wean from the ventilator.  Palliative care was consulted help address goals.  SOCIAL HISTORY:     reports that she quit smoking about 7 months ago. She smoked 1.00 pack per day. She has never used smokeless tobacco. She reports previous alcohol use. She reports previous drug use.  Patient was unmarried but lives at home with her boyfriend of 14 years.  She has two daughters and a son who are involved in her care.  Patient previously worked in [employer].  ADVANCE DIRECTIVES:  Does not have  CODE STATUS: Full code  PAST MEDICAL HISTORY: Past Medical History:  Diagnosis Date  . Asthma   . Cancer ([place])    esophageal cancer   . COPD (chronic obstructive pulmonary disease) ([place])   . Coronary artery disease   . Depression   . Diabetes mellitus without complication ([place])    Type II  . Diastolic dysfunction   . Diverticulosis   . Fatty liver   . GERD without esophagitis   . Hx of Clostridium difficile infection   . Hx of colonic polyps   . Hx of small bowel obstruction   . Hypertension, benign   . Hypothyroidism   . Irritable bowel syndrome   . Left bundle branch block   . Lumbar herniated disc   . Mixed hyperlipidemia   . Osteoarthritis   . PAF (paroxysmal atrial fibrillation) ([place])    06/2019  . Pneumonia   . Sleep apnea    Uses O2 at bedtime on occasion    PAST SURGICAL HISTORY:  Past Surgical History:  Procedure Laterality Date  . ABDOMINAL HYSTERECTOMY  2002  . APPENDECTOMY  2005  . CARDIAC CATHETERIZATION    . CARPAL TUNNEL RELEASE Bilateral   . INTERCOSTAL NERVE BLOCK Right 10/17/2019   Procedure: INTERCOSTAL NERVE BLOCK;  Surgeon: Lajuana Matte, MD;  Location: [facility];  Service: Thoracic;  Laterality: Right;  . JEJUNOSTOMY N/A 10/07/2019   Procedure: JEJUNOSTOMY PLACEMENT;  Surgeon: Lajuana Matte, MD;  Location: [facility];  Service: Thoracic;  Laterality: N/A;  . LEFT HEART CATH AND CORONARY ANGIOGRAPHY N/A 12/28/2018   Procedure: LEFT HEART CATH AND CORONARY ANGIOGRAPHY;  Surgeon: Belva Crome, MD;  Location: [facility];  Service: Cardiovascular;  Laterality: N/A;  . SMALL INTESTINE SURGERY  11/26/2012  Bowel obstruction, Dr. Pauletta Browns  . TUBAL LIGATION  1980  . VIDEO BRONCHOSCOPY N/A 10/22/2019   Procedure: VIDEO BRONCHOSCOPY;  Surgeon: Lajuana Matte, MD;  Location: [facility];  Service: Thoracic;  Laterality: N/A;    HEMATOLOGY/ONCOLOGY HISTORY:  Oncology History   No history exists.    ALLERGIES:  is allergic to honey bee venom protein [bee venom], ether, nickel, other, and tape.  MEDICATIONS:  Current Facility-Administered Medications  Medication Dose Route  Frequency Provider Last Rate Last Admin  . 0.9 %  sodium chloride infusion   Intravenous PRN Candee Furbish, MD 10 mL/hr at 11/07/19 2044 New Bag at 11/07/19 2044  . amiodarone (NEXTERONE PREMIX) 360-4.14 MG/200ML-% (1.8 mg/mL) IV infusion  30 mg/hr Intravenous Continuous Omar Person, NP 16.67 mL/hr at 11/12/19 1100 30 mg/hr at 11/12/19 1100  . aspirin chewable tablet 81 mg  81 mg Per Tube Daily Candee Furbish, MD   81 mg at 11/12/19 0919  . chlorhexidine gluconate (MEDLINE KIT) (PERIDEX) 0.12 % solution 15 mL  15 mL Mouth Rinse BID Lightfoot, Harrell O, MD   15 mL at 11/12/19 0730  . Chlorhexidine Gluconate Cloth 2 % PADS 6 each  6 each Topical Daily Lajuana Matte, MD   6 each at 11/08/19 1135  . enoxaparin (LOVENOX) injection 60 mg  60 mg Subcutaneous Q12H Einar Grad, RPH   60 mg at 11/12/19 1124  . feeding supplement (PROSource TF) liquid 45 mL  45 mL Per Tube TID Noemi Chapel P, DO   45 mL at 11/12/19 0919  . feeding supplement (VITAL 1.5 CAL) liquid 1,000 mL  1,000 mL Per Tube Continuous Omar Person, NP 20 mL/hr at 11/10/19 1115 1,000 mL at 11/10/19 1115  . fentaNYL (DURAGESIC) 75 MCG/HR 1 patch  1 patch Transdermal Q72H Kipp Brood, MD   1 patch at 11/12/19 1047   And  . fentaNYL (DURAGESIC) 75 MCG/HR 1 patch  1 patch Transdermal Q72H Kipp Brood, MD   1 patch at 11/12/19 1047  . fentaNYL (SUBLIMAZE) injection 25 mcg  25 mcg Intravenous Q2H PRN Candee Furbish, MD   100 mcg at 11/08/19 1030  . fentaNYL (SUBLIMAZE) injection 25-100 mcg  25-100 mcg Intravenous Q30 min PRN Jacalyn Lefevre, MD   75 mcg at 11/12/19 0824  . fentaNYL 2572mg in NS 2569m(1053mml) infusion-PREMIX  0-200 mcg/hr Intravenous Continuous EllMargaretha SeedsD 20 mL/hr at 11/12/19 1100 200 mcg/hr at 11/12/19 1100  . Gerhardt's butt cream   Topical BID LigLajuana MatteD   Given at 11/12/19 1000  . insulin aspart (novoLOG) injection 0-9 Units  0-9 Units Subcutaneous Q4H ClaJulian HyO   2 Units at 11/12/19 1125  . insulin detemir (LEVEMIR) injection 10 Units  10 Units Subcutaneous BID BitEinar GradPHSelect [facility]10 Units at 11/12/19 [id number] ipratropium-albuterol (DUONEB) 0.5-2.5 (3) MG/3ML nebulizer solution 3 mL  3 mL Nebulization Q4H PRN SmiCandee FurbishD   3 mL at 11/08/19 0836  . levothyroxine (SYNTHROID) tablet 75 mcg  75 mcg Per Tube QAC breakfast BasJose PersiaD   75 mcg at 11/12/19 0511  . liver oil-zinc oxide (DESITIN) 40 % ointment   Topical BID LigLajuana MatteD   Given at 11/11/19 2119  . MEDLINE mouth rinse  15 mL Mouth Rinse 10 times per day LigLajuana MatteD   15 mL at 11/12/19 1128  . meropenem (MERREM) 1 g  in sodium chloride 0.9 % 100 mL IVPB  1 g Intravenous Q8H Julian Hy, DO   Stopped at 11/12/19 0546  . metoCLOPramide (REGLAN) injection 5 mg  5 mg Intravenous Q6H Noemi Chapel P, DO   5 mg at 11/12/19 1124  . midazolam (VERSED) injection 1 mg  1 mg Intravenous Q4H PRN Anders Simmonds, MD   1 mg at 11/09/19 1307  . norepinephrine (LEVOPHED) 16 mg in 29m premix infusion  0-40 mcg/min Intravenous Titrated Lightfoot, Harrell O, MD 9.38 mL/hr at 11/12/19 1100 10 mcg/min at 11/12/19 1100  . oxyCODONE (ROXICODONE) 5 MG/5ML solution 5 mg  5 mg Per Tube Q4H PRN LLajuana Matte MD   5 mg at 11/11/19 0953  . pantoprazole (PROTONIX) injection 40 mg  40 mg Intravenous Q12H Gold, Wayne E, PA-C   40 mg at 11/12/19 05852 . polyethylene glycol (MIRALAX / GLYCOLAX) packet 17 g  17 g Per Tube Daily PRN BEinar Grad RPH      . potassium chloride (KLOR-CON) packet 20 mEq  20 mEq Per Tube Q4H LLajuana Matte MD   20 mEq at 11/12/19 0919  . propofol (DIPRIVAN) 1000 MG/100ML infusion  5-30 mcg/kg/min Intravenous Titrated CNoemi ChapelP, DO 2.02 mL/hr at 11/12/19 1100 5 mcg/kg/min at 11/12/19 1100  . QUEtiapine (SEROQUEL) tablet 25 mg  25 mg Per Tube BID BCristal Generous NP   25 mg at 11/12/19 0919  . simethicone (MYLICON) 40  MDP/8.2UMsuspension 40 mg  40 mg Per Tube Q6H PRN BCristal Generous NP   40 mg at 10/25/19 1715  . sodium chloride flush (NS) 0.9 % injection 10-40 mL  10-40 mL Intracatheter Q12H LLajuana Matte MD   10 mL at 11/11/19 2109  . sodium chloride flush (NS) 0.9 % injection 10-40 mL  10-40 mL Intracatheter PRN LLajuana Matte MD   10 mL at 10/29/19 1207  . sodium chloride flush (NS) 0.9 % injection 10-40 mL  10-40 mL Intracatheter Q12H LLajuana Matte MD   10 mL at 11/11/19 2109  . sodium chloride flush (NS) 0.9 % injection 10-40 mL  10-40 mL Intracatheter PRN LLajuana Matte MD   10 mL at 10/26/19 1757  . TPN ADULT (ION)   Intravenous Continuous TPN LLajuana Matte MD 40 mL/hr at 11/12/19 1100 Rate Verify at 11/12/19 1100  . TPN ADULT (ION)   Intravenous Continuous TPN [facility]olla [facility]atten [facility]     . vasopressin (PITRESSIN) 20 Units in sodium chloride 0.9 % 100 mL infusion-*FOR SHOCK*  0-0.03 Units/min Intravenous Continuous CNoemi ChapelP, DO 9 mL/hr at 11/12/19 1100 0.03 Units/min at 11/12/19 1100    VITAL SIGNS: BP 113/61   Pulse 68   Temp 98.7 F (37.1 C) (Axillary)   Resp (!) 21   Ht 5' 2"  (1.575 m)   Wt 152 lb 5.4 oz (69.1 kg)   SpO2 96%   BMI 27.86 kg/m  Filed Weights   11/10/19 0155 11/11/19 0426 11/12/19 0416  Weight: 142 lb 13.7 oz (64.8 kg) 151 lb 7.3 oz (68.7 kg) 152 lb 5.4 oz (69.1 kg)    Estimated body mass index is 27.86 kg/m as calculated from the following:   Height as of this encounter: 5' 2"  (1.575 m).   Weight as of this encounter: 152 lb 5.4 oz (69.1 kg).  LABS: CBC:    Component Value Date/Time   WBC 8.1 11/12/2019 0312   HGB 7.1 (L)  11/12/2019 0312   HGB 10.0 (L) 07/12/2019 0854   HCT 24.3 (L) 11/12/2019 0312   HCT 29.9 (L) 07/12/2019 0854   PLT 212 11/12/2019 0312   PLT 201 07/12/2019 0854   MCV 100.0 11/12/2019 0312   MCV 88 07/12/2019 0854   NEUTROABS 6.8 11/12/2019 0312   LYMPHSABS 0.4 (L) 11/12/2019 0312   MONOABS 0.7  11/12/2019 0312   EOSABS 0.1 11/12/2019 0312   BASOSABS 0.0 11/12/2019 0312   Comprehensive Metabolic Panel:    Component Value Date/Time   NA 141 11/12/2019 0312   NA 137 07/12/2019 0854   K 3.7 11/12/2019 0312   CL 96 (L) 11/12/2019 0312   CO2 36 (H) 11/12/2019 0312   BUN 40 (H) 11/12/2019 0312   BUN 15 07/12/2019 0854   CREATININE 0.47 11/12/2019 0312   GLUCOSE 215 (H) 11/12/2019 0312   CALCIUM 7.4 (L) 11/12/2019 0312   AST 13 (L) 11/12/2019 0312   ALT 13 11/12/2019 0312   ALKPHOS 48 11/12/2019 0312   BILITOT 0.9 11/12/2019 0312   PROT 5.2 (L) 11/12/2019 0312   ALBUMIN 1.4 (L) 11/12/2019 0312    RADIOGRAPHIC STUDIES: DG Chest 1 View  Result Date: 10/21/2019 CLINICAL DATA:  Intubation EXAM: CHEST  1 VIEW COMPARISON:  10/21/2019 at 0536 hours FINDINGS: Interval placement of endotracheal tube with distal tip terminating 1.9 cm above the carina. Stable positioning of right-sided Port-A-Cath and right chest tube. Stable cardiomediastinal contours. Atherosclerotic calcification of the aortic knob. Persistent interstitial opacities throughout the left lung. Small bilateral pleural effusions. No discernible pneumothorax. IMPRESSION: 1. Interval placement of endotracheal tube with distal tip terminating 1.9 cm above the carina. 2. Otherwise stable exam. Electronically Signed   By: Davina Poke D.O.   On: 10/21/2019 09:04   DG Chest 1 View  Result Date: 10/17/2019 CLINICAL DATA:  Right pneumothorax EXAM: CHEST  1 VIEW COMPARISON:  10/16/2019 chest radiograph and prior. FINDINGS: Small right apical pneumothorax, unchanged. Left predominant bibasilar opacities and small left pleural effusion, unchanged. Stable support devices.  Telemetry wires overlie the chest. Right chest wall subcutaneous emphysema. Partially obscured cardiomediastinal silhouette. IMPRESSION: Right apical pneumothorax, unchanged.  Indwelling right chest tube. Bibasilar opacities and small left pleural effusion,  unchanged. Electronically Signed   By: Primitivo Gauze M.D.   On: 10/17/2019 08:18   DG Chest 1 View  Result Date: 10/16/2019 CLINICAL DATA:  Follow-up of right-sided pleural effusion. Status post esophagectomy. EXAM: CHEST  1 VIEW COMPARISON:  One day prior FINDINGS: Patient moderately rotated left. Radiopaque density projecting over the left side of the thoracic inlet, as before. Nasogastric tube extends beyond the inferior aspect of the film. Right internal jugular line tip at low SVC. Right-sided chest tube is similar in position, terminating over the upper chest. The right-sided apical pneumothorax has resolved. Hemidiaphragm is well visualized and sub pulmonic air is suspected. Decrease in small volume right-sided subcutaneous emphysema. Mild cardiomegaly. Atherosclerosis in the transverse aorta. Small left pleural effusion is new. Increased bibasilar airspace disease. IMPRESSION: Right-sided chest tube remaining in place with decrease in right-sided pneumothorax. Suspect residual small volume sub pulmonic air. Worsened aeration with developing left pleural fluid and bibasilar airspace disease. Most likely atelectasis. At the left lung base, infection or aspiration is a consideration. Probable surgical drain projecting over the upper left chest, as before. Electronically Signed   By: Abigail Miyamoto M.D.   On: 10/16/2019 11:17   DG Abd 1 View  Result Date: 11/07/2019 CLINICAL DATA:  OG tube  placement EXAM: ABDOMEN - 1 VIEW COMPARISON:  11/07/2019 FINDINGS: NG tube tip is in stable position in the region of the distal stomach. IMPRESSION: NG tube tip in the distal stomach, unchanged Electronically Signed   By: Rolm Baptise M.D.   On: 11/07/2019 21:17   DG Abd 1 View  Result Date: 10/26/2019 CLINICAL DATA:  Ileus EXAM: ABDOMEN - 1 VIEW COMPARISON:  10/15/2019 FINDINGS: Left lower quadrant jejunostomy tube. Mild diffuse air distension of the bowel. No radiopaque calculi. IMPRESSION: Mild diffuse  air distension of the bowel, either reflecting ileus or low-grade obstruction. Electronically Signed   By: Donavan Foil M.D.   On: 10/26/2019 19:16   CT CHEST WO CONTRAST  Result Date: 11/07/2019 CLINICAL DATA:  History of prior soft jet me with gastric pull-through and persistent drainage at the cervical incision site suggestive of leak, increasing left-sided infiltrate. EXAM: CT CHEST WITHOUT CONTRAST TECHNIQUE: Multidetector CT imaging of the chest was performed following the standard protocol without IV contrast. COMPARISON:  Chest x-ray from earlier in the same day, PET-CT from 09/27/2019, CT of the abdomen and pelvis from 10/30/2019 FINDINGS: Cardiovascular: Thoracic aorta demonstrates atherosclerotic calcifications. Evaluation is limited due to lack of IV contrast. Coronary calcifications are seen. No cardiac enlargement is noted. Left-sided PICC line and right-sided chest port are noted and stable. Mediastinum/Nodes: Thoracic inlet shows open neck wound consistent with the known prior esophagectomy and gastric pull-through. Packing material is noted within the wound. This approaches the superior aspect of the gastric pull-through adjacent to the anastomotic site although does not appear to communicate with the wound directly. Approximately 1 cm separation is noted from the superior aspect of the pull-through and the wound. Endotracheal tube is noted in satisfactory position. Gastric catheter extends into the stomach and towards the pylorus. No significant hilar or mediastinal adenopathy is noted. Lungs/Pleura: Right lung demonstrates lower lobe atelectatic changes. Large bore chest tube is seen in place. The previously pace pigtail catheter is noted laterally with small residual fluid collection identified. The large bore chest tube courses through this area as well prior to its course medially and posteriorly to the right lung. A small loculated air collection is noted superiorly which does not appear  to definitively communicate with the gastric pull-through. Left-sided pleural effusion is noted with lower lobe consolidation which has increased in the interval from the prior CT from 10/30/2019. The diffuse density throughout the left lung has improved somewhat in the interval from the prior CT examination and is likely related to underlying edema. Emphysematous changes are noted within the left lung as well. Upper Abdomen: Upper abdomen is within normal limits aside from the changes consistent with the gastric pull-through. Musculoskeletal: Degenerative changes of the thoracic spine are noted. No acute rib abnormality is noted. IMPRESSION: Changes consistent with the known history of esophagectomy with gastric pull-through. There is air which extends superior laterally from the gastric pull-through on the left which likely represents some redundant portion of the stomach but approaches the cervical wound superiorly and anteriorly. Given this proximity, the possibility of a small proximal leak could not be totally excluded. Repeat esophagram is recommended with water soluble contrast with the patient angled with the left side of the chest inferiorly. Persistent air-fluid collection in the apex of the right lung just below the tip of the large bore indwelling chest tube. Pigtail catheter is also noted within a small residual collection laterally. Right basilar atelectasis. Persistent but improved opacification in the left lung when compared with  the prior exam from 10/30/2019. Likely related to underlying edema. Increasing consolidation in the left lower lobe when compared with the prior CT from 10/30/2019. Associated slightly increased small left-sided pleural effusion is noted as well. Aortic Atherosclerosis (ICD10-I70.0) and Emphysema (ICD10-J43.9). Electronically Signed   By: Inez Catalina M.D.   On: 11/07/2019 16:44   CT ABDOMEN PELVIS W CONTRAST  Result Date: 10/30/2019 CLINICAL DATA:  69 year old female  with abdominal pain and distension. History of esophageal carcinoma with esophagectomy and RIGHT pleural effusion/pleural catheter placement. History of prior small bowel surgery for bowel obstruction. EXAM: CT ABDOMEN AND PELVIS WITH CONTRAST TECHNIQUE: Multidetector CT imaging of the abdomen and pelvis was performed using the standard protocol following bolus administration of intravenous contrast. CONTRAST:  117m OMNIPAQUE IOHEXOL 300 MG/ML  SOLN COMPARISON:  08/16/2019 CT prior studies FINDINGS: Lower chest: New diffuse airspace disease/consolidation/ground-glass opacities throughout the mid and LOWER LEFT lung noted with small LEFT pleural effusion which appears loculated. Scattered ground-glass opacities within the mid and LOWER RIGHT lung are present. Pleural catheters are noted within small loculated RIGHT pleural collections. Esophagectomy and gastric pull-through changes are present. Hepatobiliary: The liver and gallbladder are unremarkable. No biliary dilatation. Pancreas: Unremarkable Spleen: Unremarkable Adrenals/Urinary Tract: The kidneys, adrenal glands and bladder are unremarkable. Stomach/Bowel: Small bowel surgical anastomotic changes are noted within the LEFT abdomen. There are dilated loops of mid-distal small bowel loops without definite transition point. Contrast is noted within distal small bowel loops and colon. A LEFT LOWER quadrant percutaneous jejunostomy tube is present without adjacent fluid or abscess. There is edematous/circumferential wall thickening of a small bowel loop within the UPPER mid pelvis measuring at least 15 cm in length (series 3: Images 66-74) without evidence of pneumatosis. This is nonspecific and may represent focal enteritis but ischemic bowel could have this appearance. There is no evidence of pneumoperitoneum or abscess. A rectal tube/drain is noted. Vascular/Lymphatic: Aortic atherosclerotic calcifications are identified. The atherosclerotic calcifications of  the visualized mesenteric vessels noted which appear patent. No abnormal lymph nodes are identified. Reproductive: Status post hysterectomy. No adnexal masses. Other: A trace amount of ascites is noted adjacent to the liver and spleen. No pneumoperitoneum identified. Subcutaneous edema is present. Musculoskeletal: No acute or suspicious bony abnormalities are identified. IMPRESSION: 1. Edematous/circumferential wall thickening of a small bowel loop within the UPPER mid pelvis measuring at least 15 cm in length without evidence of pneumatosis. This is nonspecific and may represent a focal enteritis. Ischemic bowel could have this appearance but there is no evidence of pneumatosis and the visualized mesenteric vessels appear patent. No evidence of pneumoperitoneum or abscess. 2. Dilated loops of mid-distal small bowel loops without definite transition point. Contrast is noted within distal small bowel loops and colon and this may represent a postoperative ileus. 3. Diffuse airspace disease/consolidation/ground-glass opacities throughout the visualized mid and LOWER LEFT lung and scattered ground-glass opacities within the mid and LOWER RIGHT lung. 4. LEFT LOWER quadrant percutaneous jejunostomy tube without evidence of adjacent fluid or abscess. 5. Pleural catheters within small loculated RIGHT pleural collections. Small LEFT pleural effusion which appears loculated. 6. Trace amount of ascites. 7. Aortic Atherosclerosis (ICD10-I70.0). Electronically Signed   By: JMargarette CanadaM.D.   On: 10/30/2019 15:03   DG ABDOMEN PEG TUBE LOCATION  Result Date: 10/15/2019 CLINICAL DATA:  Evaluate feeding jejunostomy catheter. EXAM: ABDOMEN - 1 VIEW COMPARISON:  None. FINDINGS: Contrast has been injected via a jejunostomy catheter demonstrating opacification of several loops of small bowel. Paucity of  bowel gas without evidence of enteric obstruction. Enteric tube tip projects over the left upper abdominal quadrant. No acute  osseous abnormalities. IMPRESSION: Contrast injection of feeding jejunostomy catheter opacifies several loops of small bowel suggesting appropriate positioning and functionality. Electronically Signed   By: Sandi Mariscal M.D.   On: 10/15/2019 13:49   DG CHEST PORT 1 VIEW  Result Date: 11/08/2019 CLINICAL DATA:  69 year old female with history of esophageal cancer status post esophagectomy and gastric pull-through subsequent chest tube drainage of loculated right pleural collection. Left lung edema versus pneumonia. Intubated. EXAM: PORTABLE CHEST 1 VIEW COMPARISON:  Chest CT 01/17/2019 and earlier. FINDINGS: Portable AP semi upright view at 0824 hours. Stable ET tube tip in good position between the level the clavicles and carina. Enteric tube courses to the abdomen, tip not included. Stable right side chest tube x2 (large caliber tube medially and pigtail tube laterally). Stable right chest Port-A-Cath and left PICC line. Stable lung volumes and ventilation from yesterday with combined widespread dense reticulonodular and retrocardiac consolidation in the left lung. No pneumothorax identified. Small left pleural effusion better demonstrated by CT yesterday. Stable mediastinal contours. IMPRESSION: 1.  Stable lines and tubes. 2. No pneumothorax and stable ventilation. Widespread left lung opacity and small pleural effusion as seen by CT yesterday. Electronically Signed   By: Genevie Ann M.D.   On: 11/08/2019 08:36   DG Chest Port 1 View  Result Date: 11/07/2019 CLINICAL DATA:  Endotracheal tube. EXAM: PORTABLE CHEST 1 VIEW COMPARISON:  Yesterday FINDINGS: Endotracheal tube with tip just below the clavicular heads. Left PICC with tip at the SVC. Right porta catheter with tip at the SVC. Right-sided chest tubes. Confluent airspace disease on the left and mild opacity on the right with stable pleural opacity at the lateral right base. No definite pneumothorax. IMPRESSION: Stable hardware positioning and extensive  left-sided airspace disease. Electronically Signed   By: Monte Fantasia M.D.   On: 11/07/2019 07:07   DG CHEST PORT 1 VIEW  Result Date: 11/06/2019 CLINICAL DATA:  Chest tube EXAM: PORTABLE CHEST 1 VIEW COMPARISON:  11/05/2019 FINDINGS: Endotracheal tube, right chest wall port, left PICC line, and two right chest tubes are again identified. Unchanged small right apical pneumothorax. Stable right pleural thickening. Diffuse left lung opacities again identified. Stable cardiomediastinal contours. IMPRESSION: 1. Stable lines and tubes. 2. Unchanged small right apical pneumothorax. Persistent diffuse left lung opacities. Electronically Signed   By: Macy Mis M.D.   On: 11/06/2019 08:21   DG Chest Port 1 View  Result Date: 11/05/2019 CLINICAL DATA:  Intubated patient. Respiratory distress. Follow-up exam. EXAM: PORTABLE CHEST 1 VIEW COMPARISON:  11/04/2019 and earlier exams. FINDINGS: Small right apical pneumothorax is stable. Interstitial and hazy airspace opacities throughout the left lung are unchanged. Interstitial thickening on the right, most evident at the base, is also unchanged. No new lung abnormalities. Larger caliber right chest tube, tip near the right apex, stable. Stable smaller caliber chest tube along the right lateral mid hemithorax. Endotracheal tube, nasogastric tube, right-sided Port-A-Cath and left-sided PICC are stable in well positioned. IMPRESSION: 1. No change from the previous day's study. 2. Small right apical pneumothorax. 3. Diffuse left lung interstitial and airspace opacities consistent with pneumonia. 4. Support apparatus is stable and well positioned. Electronically Signed   By: Lajean Manes M.D.   On: 11/05/2019 08:39   DG CHEST PORT 1 VIEW  Result Date: 11/04/2019 CLINICAL DATA:  Intubation.  Chest tubes. EXAM: PORTABLE CHEST 1 VIEW COMPARISON:  11/03/2019 FINDINGS: Endotracheal tube, NG tube, Port-A-Cath, right chest tubes in stable position. Stable small right  upper medial pneumothorax. Stable right-sided pleural thickening. Diffuse left lung infiltrate again noted. Heart size stable. IMPRESSION: 1. Lines and tubes in including right chest tubes in stable position. Stable small right upper medial pneumothorax. Stable right-sided pleural thickening. 2. Diffuse left lung infiltrate again noted without interim change. Electronically Signed   By: Marcello Moores  Register   On: 11/04/2019 06:47   DG Chest Port 1 View  Result Date: 11/03/2019 CLINICAL DATA:  Intubation, chest tubes EXAM: PORTABLE CHEST 1 VIEW COMPARISON:  Portable exam 0531 hours compared to 10/30/2019 FINDINGS: Tip of endotracheal tube projects 3.1 cm above carina. Nasogastric tube extends into stomach. RIGHT jugular Port-A-Cath with tip projecting over SVC. LEFT arm PICC line, tip projecting over SVC. RIGHT thoracostomy tubes again seen with loculated pneumothorax at the medial upper RIGHT lung. Atherosclerotic calcification aorta. Normal heart size mediastinal contours. Extensive infiltrates throughout LEFT lung and mildly at RIGHT base. Small RIGHT pleural effusion. No acute osseous findings. IMPRESSION: Asymmetric pulmonary infiltrates much greater on LEFT, less at RIGHT lung base. Loculated pneumothorax at medial upper RIGHT hemithorax despite thoracostomy tubes. Aortic Atherosclerosis (ICD10-I70.0). Electronically Signed   By: Lavonia Dana M.D.   On: 11/03/2019 07:53   DG CHEST PORT 1 VIEW  Result Date: 10/30/2019 CLINICAL DATA:  History of esophagectomy EXAM: PORTABLE CHEST 1 VIEW COMPARISON:  Six days ago FINDINGS: Endotracheal tube with tip 4 mm above the carina. Right port and left PICC in stable position. Pigtail pleural catheter on the right in addition to the large bore chest tube, with reduction and right-sided pleural effusion. Asymmetric interstitial and airspace opacity on the left. No visible pneumothorax. Normal heart size. IMPRESSION: 1. Lower endotracheal tube, tip 4 mm above the carina.  2. Decreased right loculated pleural effusion after pigtail placement. 3. Unchanged asymmetric left-sided airspace disease, under treatment for pneumonia. Electronically Signed   By: Monte Fantasia M.D.   On: 10/30/2019 07:15   DG CHEST PORT 1 VIEW  Result Date: 10/24/2019 CLINICAL DATA:  Intubation EXAM: PORTABLE CHEST 1 VIEW COMPARISON:  Earlier today FINDINGS: Endotracheal tube with tip just below the clavicular heads. Porta catheter on the right with tip at the right atrium. Left PICC in unremarkable position. Tunneled pleural catheter on the right with lateral loculated appearing pleural based opacity. Diffuse interstitial coarsening. No pneumothorax seen. Normal heart size. IMPRESSION: 1. New endotracheal tube in unremarkable position. 2. Pulmonary and right pleural opacity (likely loculated) without change from earlier today. Electronically Signed   By: Monte Fantasia M.D.   On: 10/24/2019 07:18   DG Chest Port 1 View  Result Date: 10/24/2019 CLINICAL DATA:  Pneumothorax with sudden drop in oxygen saturation. EXAM: PORTABLE CHEST 1 VIEW COMPARISON:  10/21/2019 FINDINGS: Power port type central venous catheter with tip over the low SVC region. Left PICC line with tip over the cavoatrial junction region. Right chest tube. No significant residual pneumothorax. Significant interval increase in bilateral diffuse airspace and interstitial infiltrates consistent with edema or aspiration. Pneumonia would be a less likely possibility. Increasing consolidation or loculated fluid in the right lung base laterally. Cardiac enlargement. Calcification of the aorta. IMPRESSION: Significant interval increase in bilateral diffuse airspace and interstitial infiltrates most consistent with edema or aspiration. Increasing consolidation or loculated fluid in the right lung base laterally. Electronically Signed   By: Lucienne Capers M.D.   On: 10/24/2019 05:37   DG CHEST PORT 1 VIEW  Result Date:  10/21/2019 CLINICAL DATA:  Shortness of breath. EXAM: PORTABLE CHEST 1 VIEW COMPARISON:  October 20, 2019. FINDINGS: Stable cardiomediastinal silhouette. Right subclavian Port-A-Cath is unchanged. Right-sided chest tube is noted without pneumothorax. Stable diffuse left lung densities are noted concerning for possible inflammation. Small left pleural effusion may be present. Minimal right pleural effusion is noted. Minimal right basilar subsegmental atelectasis is noted. Bony thorax is unremarkable. IMPRESSION: Stable diffuse left lung densities are noted concerning for possible inflammation. Small left pleural effusion may be present. Minimal right basilar subsegmental atelectasis is noted. Aortic Atherosclerosis (ICD10-I70.0). Electronically Signed   By: Marijo Conception M.D.   On: 10/21/2019 07:44   DG CHEST PORT 1 VIEW  Result Date: 10/20/2019 CLINICAL DATA:  Status post esophagectomy EXAM: PORTABLE CHEST 1 VIEW COMPARISON:  October 20, 2019 study obtained earlier in the day FINDINGS: Chest tube unchanged in position on the right. Right basilar pneumothorax is smaller compared to earlier in the day. No tension component. Subcutaneous air present on the right. There is extensive airspace opacity throughout the left upper lobe and left perihilar regions, similar to earlier in the day. There is patchy atelectasis in the right mid lung and right base with suspected loculated pleural effusion on the right laterally. No new opacity evident. Port-A-Cath tip is in the superior vena cava. Heart size and pulmonary vascularity are within normal limits. No adenopathy is appreciable. There is aortic atherosclerosis. No bone lesions. IMPRESSION: Right base pneumothorax smaller compared to earlier in the day. No tension component. No change in tube and catheter positions. Extensive airspace opacity left upper lobe and left perihilar regions, likely pneumonia, although aspiration could present in this manner. No new  opacity. Stable cardiac silhouette. Aortic Atherosclerosis (ICD10-I70.0). Electronically Signed   By: Lowella Grip III M.D.   On: 10/20/2019 08:36   DG Chest Port 1 View  Result Date: 10/20/2019 CLINICAL DATA:  Respiratory distress EXAM: PORTABLE CHEST 1 VIEW COMPARISON:  10/17/2019 FINDINGS: Pulmonary insufflation has improved slightly. Right chest tube is unchanged in position. Small right basilar pneumothorax, however, has increased in size since prior examination and there has now developed a small amount of laterally loculated pleural fluid. Extensive airspace infiltrate has now developed throughout the left upper lobe, likely infectious in the appropriate clinical setting. Right subclavian chest port with its tip within the superior vena cava and left medial apical probable Penrose surgical drain are unchanged. Cardiac size within normal limits. Pulmonary vascularity is normal. No acute bone abnormality. IMPRESSION: Interval development of small right basilar pneumothorax and loculated pleural fluid. Correlation for appropriate function of the indwelling right chest tube is recommended. Interval development of extensive airspace infiltrate within the left upper lobe suspicious for developing acute lobar pneumonia. These results will be called to the ordering clinician or representative by the Radiologist Assistant, and communication documented in the PACS or [employer]. Electronically Signed   By: Fidela Salisbury MD   On: 10/20/2019 01:01   DG CHEST PORT 1 VIEW  Result Date: 10/16/2019 CLINICAL DATA:  Shortness of breath EXAM: PORTABLE CHEST 1 VIEW COMPARISON:  Chest radiograph earlier same date FINDINGS: Enteric tube courses inferior to the diaphragm. Port-A-Cath tip projects over the superior vena cava. Probable surgical drain medial left upper hemithorax. Stable cardiac and mediastinal contours. Similar bibasilar airspace opacities with small bilateral pleural effusions. Small right  apical pneumothorax. Small amount of subcutaneous emphysema overlying the right lateral chest wall. Similar position right chest tube. IMPRESSION: Small right apical  pneumothorax.  Right chest tube in position. Similar bibasilar opacities and small bilateral pleural effusions. Electronically Signed   By: Lovey Newcomer M.D.   On: 10/16/2019 13:53   DG CHEST PORT 1 VIEW  Result Date: 10/15/2019 CLINICAL DATA:  Post esophagectomy. EXAM: PORTABLE CHEST 1 VIEW COMPARISON:  10/17/2019; 10/11/2019 FINDINGS: Grossly unchanged cardiac silhouette and mediastinal contours. Stable positioning of support apparatus with suspected development of a tiny/small right apical pneumothorax. The amount of right lateral chest wall subcutaneous emphysema is grossly unchanged. Curvilinear opacity overlying the left thoracic inlet is unchanged and again may represent a surgical drainage catheter. Slight worsening of bilateral medial basilar opacities favored to represent atelectasis. No pleural effusion, though note, the left costophrenic angle is excluded from view. No evidence of edema. No acute osseous abnormalities. IMPRESSION: 1. Stable positioning of support apparatus with suspected development of a tiny/small right apical pneumothorax. 2. Worsening bilateral medial basilar opacities, likely atelectasis. 3. Redemonstrated curvilinear opacity overlying the left thoracic inlet potentially representative of a surgical drain. Clinical correlation is advised. Electronically Signed   By: Sandi Mariscal M.D.   On: 10/15/2019 13:48   DG Chest Port 1 View  Result Date: 10/10/2019 CLINICAL DATA:  Status post esophagectomy EXAM: PORTABLE CHEST 1 VIEW COMPARISON:  10/11/2019 FINDINGS: Gastric catheter is noted extending below the diaphragm. Right-sided chest wall port is again seen. Right chest tube is noted in place with subcutaneous emphysema. No definitive pneumothorax is noted at this time. Cardiac shadow is stable. Aortic calcifications  are seen. Minimal scarring in the left base is seen. No bony abnormality is noted. There is a somewhat linear density identified just to the left of the gastric catheter and extending towards the left clavicle. This may represent a surgical drain. Correlation with physical findings is recommended. IMPRESSION: Postsurgical changes are seen. No pneumothorax is noted. Curvilinear radiopacity along the left chest extending superiorly. This may represent a local drain. Correlation with the physical findings is recommended. Electronically Signed   By: Inez Catalina M.D.   On: 11/02/2019 19:11   DG Abd Portable 1V  Result Date: 11/07/2019 CLINICAL DATA:  Abdominal swelling. EXAM: PORTABLE ABDOMEN - 1 VIEW COMPARISON:  None. FINDINGS: The bowel gas pattern is normal. Distal tip of nasogastric tube is seen in distal stomach. No radio-opaque calculi or other significant radiographic abnormality are seen. IMPRESSION: No evidence of bowel obstruction or ileus. Distal tip of nasogastric tube seen in distal stomach. Electronically Signed   By: Marijo Conception M.D.   On: 11/07/2019 15:17   DG Abd Portable 1V  Result Date: 10/28/2019 CLINICAL DATA:  Jejunostomy tube leak EXAM: PORTABLE ABDOMEN - 1 VIEW COMPARISON:  10/26/2019 FINDINGS: Feeding tube in the left lower quadrant. Contrast present within left lower quadrant small bowel loops. No gross extravasation. No significant change in mild gaseous dilatation of bowel. Pigtail catheter over the right lower lateral chest with adjacent right pleural effusion or thickening. IMPRESSION: Left lower quadrant feeding tube with contrast opacification of small bowel loops. No gross extravasation on limited static view. Electronically Signed   By: Donavan Foil M.D.   On: 10/28/2019 15:29   CT [facility] PLEURAL DRAIN W/INDWELL CATH W/IMG GUIDE  Result Date: 10/24/2019 INDICATION: 69 year old female referred for pigtail catheter placement into a loculated right pleural fluid  collection EXAM: IMAGE GUIDED PLACEMENT OF PIGTAIL DRAINAGE CATHETER INTO THE RIGHT PLEURAL SPACE MEDICATIONS: The patient is currently admitted to the hospital and receiving intravenous antibiotics. The antibiotics were administered within an  appropriate time frame prior to the initiation of the procedure. ANESTHESIA/SEDATION: No sedation COMPLICATIONS: None PROCEDURE: Informed written consent was obtained from the patient after a thorough discussion of the procedural risks, benefits and alternatives. All questions were addressed. Maximal Sterile Barrier Technique was utilized including caps, mask, sterile gowns, sterile gloves, sterile drape, hand hygiene and skin antiseptic. A timeout was performed prior to the initiation of the procedure. Patient was positioned supine position on CT gantry table. Scout CT was acquired for planning purposes. The anterior right chest was prepped and draped in the usual sterile fashion. 1% lidocaine was used for local anesthesia. Using CT guidance, a Yueh needle was advanced into the right pleural space from a right anterior approach, targeting loculated fluid in the lateral right chest. Using modified Seldinger technique, 10 French pigtail drainage catheter was placed into the pleural space. Catheter was sutured in position and attached to a pleura vac system. Sample of turbid yellow fluid was sent for culture. We confirmed operational chest tube with wall suction on the pleura vac and secured the drain in place. Patient tolerated the procedure well and remained hemodynamically stable throughout. No complications were encountered and no significant blood loss. IMPRESSION: Status post CT-guided placement of pigtail drainage catheter into the right loculated pleural fluid. Signed, Dulcy Fanny. Dellia Nims, RPVI [facility] Electronically Signed   By: Corrie Mckusick D.O.   On: 10/24/2019 16:51   [place] EKG SITE RITE  Result Date:  10/21/2019 If Site Rite image not attached, placement could not be confirmed due to current cardiac rhythm.  DG ESOPHAGUS W SINGLE CM (SOL OR THIN BA)  Result Date: 10/18/2019 CLINICAL DATA:  History of prior esophagectomy and gastric pull-through, evaluate for leak EXAM: ESOPHOGRAM/BARIUM SWALLOW TECHNIQUE: Single contrast examination was performed using Omnipaque and subsequently thin barium. FLUOROSCOPY TIME:  Fluoroscopy Time:  3 minutes 18 seconds Radiation Exposure Index (if provided by the fluoroscopic device): 263.9 mGy Number of Acquired Spot Images: 54, multiple cine fluoroscopic runs COMPARISON:  None. FINDINGS: Swallowing mechanism is within normal limits. There are changes consistent with esophagectomy and gastric pull-through. The cervical anastomosis is widely patent. Nasogastric catheter is noted in place. No evidence of esophageal leak is identified. Some caliber change at the proximal anastomosis is seen. The gastric conduit is well visualized and contrast material passes into the distal stomach and subsequently through the pylorus into the small bowel. There is relative pooling within the gastric conduit likely related to the relatively recumbent positioning. Patient was imaged at 16 degrees and 23 degrees. Fall upright imaging could not be performed due to the patient's condition. Subsequently thin barium was utilized to evaluate the esophagus which again demonstrated no evidence of leak. 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 No evidence of anastomotic leak is noted in the upper chest. The conduit does empty into the small bowel although in a somewhat slowed manner although this would likely be improved with more upright positioning. The pylorus does appears somewhat lower than the diaphragmatic hiatus on these images. The pyloroplasty is widely patent. Electronically Signed   By: Inez Catalina M.D.   On: 10/18/2019 16:22    PERFORMANCE STATUS (ECOG)  : 4 - Bedbound  Review of Systems Unless otherwise noted, a complete review of systems is negative.  Physical Exam General: Ill-appearing Cardiovascular: regular rate and rhythm Pulmonary: Unlabored, on vent Extremities: no edema, no joint deformities Skin: no rashes Neurological: Sedated  IMPRESSION: Patient remains critically ill  in ICU on vent.  She is requiring pressors.  No family currently present.  I called and spoke with patient's daughter, Joycelyn Schmid.  She was at work but agreed to talk to me briefly on the phone.  Together, we reviewed patient's current medical problems.  She recognizes that the patient is critically ill and has not made significant improvement.  However, daughter says she wants to remain optimistic as she feels like patient wants to keep fighting. Daughter did state that she recognizes that the patient may die in the hospital.  She says that troubles her as she would prefer patient to die at home but she understands that discharging patient home may not be feasible.  Patient has no advance directives. Daughter says that patient has never really talked to her children about end-of-life decision-making.  Joycelyn Schmid says that her two siblings are deferring decision-making to her.  Joycelyn Schmid was unable to come to the hospital today but will be here tomorrow afternoon. Will see if we can meet with her in person to clarify goals.   PLAN: -Continue current scope of treatment -Will plan to meet with family to clarify goals   Time Total: 60 minutes  Visit consisted of counseling and education dealing with the complex and emotionally intense issues of symptom management and palliative care in the setting of serious and potentially life-threatening illness.Greater than 50%  of this time was spent counseling and coordinating care related to the above assessment and plan.  Signed by: Altha Harm, PhD, NP-C

## 2019-11-12 NOTE — Progress Notes (Signed)
Patient ID: [name], female   DOB: [birth year], 69 y.o.   MRN: [id number] T[facility] DAILY ICU PROGRESS NOTE                   [address]            [place] [address]          [phone]   29 Days Post-Op Procedure(s) (LRB): XI ROBOTIC ASSISTED [place] ESOPHAGECTOMY USING NIMS (N/A) VIDEO BRONCHOSCOPY (N/A) JEJUNOSTOMY PLACEMENT (N/A) INTERCOSTAL NERVE BLOCK (Right)  Total Length of Stay:  LOS: 29 days   Subjective: Remains sedated on ventilator  Objective: Vital signs in last 24 hours: Temp:  [98.9 F (37.2 C)-99.3 F (37.4 C)] 98.9 F (37.2 C) (11/06 0741) Pulse Rate:  [56-87] 74 (11/06 0900) Cardiac Rhythm: Normal sinus rhythm (11/06 0750) Resp:  [16-31] 26 (11/06 0900) BP: (76-169)/(26-105) 137/46 (11/06 0900) SpO2:  [83 %-99 %] 95 % (11/06 0900) FiO2 (%):  [60 %] 60 % (11/06 0855) Weight:  [69.1 kg] 69.1 kg (11/06 0416)  Filed Weights   11/10/19 0155 11/11/19 0426 11/12/19 0416  Weight: 64.8 kg 68.7 kg 69.1 kg    Weight change: 0.4 kg   Hemodynamic parameters for last 24 hours:    Intake/Output from previous day: 11/05 0701 - 11/06 0700 In: 2923.4 [I.V.:2164.5; NG/GT:500; IV Piggyback:258.8] Out: 1500 [Urine:1090; Stool:400; Chest Tube:10]  Intake/Output this shift: Total I/O In: 265.5 [I.V.:225.5; NG/GT:40] Out: 30 [Urine:30]  Current Meds: Scheduled Meds: . aspirin  81 mg Per Tube Daily  . chlorhexidine gluconate (MEDLINE KIT)  15 mL Mouth Rinse BID  . Chlorhexidine Gluconate Cloth  6 each Topical Daily  . enoxaparin (LOVENOX) injection  60 mg Subcutaneous Q12H  . feeding supplement (PROSource TF)  45 mL Per Tube TID  . [name]'s butt cream   Topical BID  . insulin aspart  0-9 Units Subcutaneous Q4H  . insulin detemir  10 Units Subcutaneous BID  . levothyroxine  75 mcg Per Tube QAC breakfast  . liver oil-zinc oxide   Topical BID  . mouth rinse  15 mL Mouth Rinse 10 times per day  . metoCLOPramide (REGLAN) injection  5 mg  Intravenous Q6H  . pantoprazole (PROTONIX) IV  40 mg Intravenous Q12H  . potassium chloride  20 mEq Per Tube Q4H  . QUEtiapine  25 mg Per Tube BID  . sodium chloride flush  10-40 mL Intracatheter Q12H  . sodium chloride flush  10-40 mL Intracatheter Q12H   Continuous Infusions: . sodium chloride 10 mL/hr at 11/07/19 2044  . amiodarone 30 mg/hr (11/12/19 0900)  . feeding supplement (VITAL 1.5 CAL) 1,000 mL (11/10/19 1115)  . fentaNYL infusion INTRAVENOUS 200 mcg/hr (11/12/19 0900)  . meropenem (MERREM) IV Stopped (11/12/19 0546)  . norepinephrine (LEVOPHED) Adult infusion 12 mcg/min (11/12/19 0900)  . propofol (DIPRIVAN) infusion 25 mcg/kg/min (11/12/19 0900)  . TPN ADULT (ION) 40 mL/hr at 11/12/19 0900  . vasopressin 0.03 Units/min (11/12/19 0900)   PRN Meds:.sodium chloride, fentaNYL (SUBLIMAZE) injection, fentaNYL (SUBLIMAZE) injection, ipratropium-albuterol, midazolam, oxyCODONE, polyethylene glycol, simethicone, sodium chloride flush, sodium chloride flush    Lab Results: CBC: Recent Labs    11/11/19 0302 11/12/19 0312  WBC 11.0* 8.1  HGB 7.7* 7.1*  HCT 26.0* 24.3*  PLT 265 212   BMET:  Recent Labs    11/11/19 0302 11/12/19 0312  NA 145 141  K 4.0 3.7  CL 98 96*  CO2 38* 36*  GLUCOSE 112* 215*  BUN 46* 40*  CREATININE 0.48 0.47  CALCIUM 7.8* 7.4*    CMET: Lab Results  Component Value Date   WBC 8.1 11/12/2019   HGB 7.1 (L) 11/12/2019   HCT 24.3 (L) 11/12/2019   PLT 212 11/12/2019   GLUCOSE 215 (H) 11/12/2019   CHOL  01/20/2007    96        ATP III CLASSIFICATION:  <200     mg/dL   Desirable  200-239  mg/dL   Borderline High  >=240    mg/dL   High          TRIG 711 (H) 11/12/2019   HDL 28 (L) 01/20/2007   LDLCALC  01/20/2007    36        Total Cholesterol/HDL:CHD Risk Coronary Heart Disease Risk Table                     Men   Women  1/2 Average Risk   3.4   3.3  Average Risk       5.0   4.4  2 X Average Risk   9.6   7.1  3 X Average Risk   23.4   11.0        Use the calculated Patient Ratio above and the CHD Risk Table to determine the patient's CHD Risk.        ATP III CLASSIFICATION (LDL):  <100     mg/dL   Optimal  100-129  mg/dL   Near or Above                    Optimal  130-159  mg/dL   Borderline  160-189  mg/dL   High  >190     mg/dL   Very High   ALT 13 11/12/2019   AST 13 (L) 11/12/2019   NA 141 11/12/2019   K 3.7 11/12/2019   CL 96 (L) 11/12/2019   CREATININE 0.47 11/12/2019   BUN 40 (H) 11/12/2019   CO2 36 (H) 11/12/2019   TSH 2.153 06/12/2019   INR 1.3 (H) 10/11/2019   HGBA1C 5.8 (H) 10/11/2019      PT/INR: No results for input(s): LABPROT, INR in the last 72 hours. Radiology: No results found.   Assessment/Plan: S/P Procedure(s) (LRB): XI ROBOTIC ASSISTED [place] ESOPHAGECTOMY USING NIMS (N/A) VIDEO BRONCHOSCOPY (N/A) JEJUNOSTOMY PLACEMENT (N/A) INTERCOSTAL NERVE BLOCK (Right) Continue supportive care with ventilator and nutritional support Dr. Kipp Flores has been discussing with the family longer-term goals of care     [name] 11/12/2019 9:19 AM

## 2019-11-13 DIAGNOSIS — J9620 Acute and chronic respiratory failure, unspecified whether with hypoxia or hypercapnia: Secondary | ICD-10-CM | POA: Diagnosis not present

## 2019-11-13 DIAGNOSIS — J9601 Acute respiratory failure with hypoxia: Secondary | ICD-10-CM | POA: Diagnosis not present

## 2019-11-13 DIAGNOSIS — Z515 Encounter for palliative care: Secondary | ICD-10-CM | POA: Diagnosis not present

## 2019-11-13 DIAGNOSIS — Z7189 Other specified counseling: Secondary | ICD-10-CM | POA: Diagnosis not present

## 2019-11-13 DIAGNOSIS — Z9911 Dependence on respirator [ventilator] status: Secondary | ICD-10-CM | POA: Diagnosis not present

## 2019-11-13 LAB — BASIC METABOLIC PANEL
Anion gap: 7 (ref 5–15)
BUN: 37 mg/dL — ABNORMAL HIGH (ref 8–23)
CO2: 33 mmol/L — ABNORMAL HIGH (ref 22–32)
Calcium: 8 mg/dL — ABNORMAL LOW (ref 8.9–10.3)
Chloride: 103 mmol/L (ref 98–111)
Creatinine, Ser: 0.58 mg/dL (ref 0.44–1.00)
GFR, Estimated: >60 mL/min (ref >60)
Glucose, Bld: 262 mg/dL — ABNORMAL HIGH (ref 70–99)
Potassium: 5.5 mmol/L — ABNORMAL HIGH (ref 3.5–5.1)
Sodium: 143 mmol/L (ref 135–145)

## 2019-11-13 LAB — GLUCOSE, CAPILLARY
Glucose-Capillary: 162 mg/dL — ABNORMAL HIGH (ref 70–99)
Glucose-Capillary: 177 mg/dL — ABNORMAL HIGH (ref 70–99)
Glucose-Capillary: 187 mg/dL — ABNORMAL HIGH (ref 70–99)
Glucose-Capillary: 189 mg/dL — ABNORMAL HIGH (ref 70–99)
Glucose-Capillary: 191 mg/dL — ABNORMAL HIGH (ref 70–99)
Glucose-Capillary: 192 mg/dL — ABNORMAL HIGH (ref 70–99)

## 2019-11-13 LAB — TRIGLYCERIDES: Triglycerides: 113 mg/dL (ref <150)

## 2019-11-13 MED ORDER — TRAVASOL 10 % IV SOLN
INTRAVENOUS | Status: AC
  Filled 2019-11-13: qty 1116

## 2019-11-13 MED ORDER — LEVOTHYROXINE SODIUM 100 MCG/5ML IV SOLN
37.5000 ug | Freq: Every day | INTRAVENOUS | Status: DC
  Administered 2019-11-14 – 2019-11-15 (×2): 37.5 ug via INTRAVENOUS
  Filled 2019-11-13 (×2): qty 5

## 2019-11-13 NOTE — Progress Notes (Signed)
Referring Physician(s): Dr Servando Snare  Supervising Physician: Daryll Brod  Patient Status:  [facility] - In-pt  Chief Complaint:  Rt loculated pleural effusion  Subjective:  IR Rt anterior chest tube placed 10/24/19  No response Chest tube in place  Allergies: Honey bee venom protein [bee venom], Ether, Nickel, Other, and Tape  Medications: Prior to Admission medications   Medication Sig Start Date End Date Taking? Authorizing Provider  Amino Acids-Protein Hydrolys (FEEDING SUPPLEMENT, PRO-STAT SUGAR FREE 64,) LIQD Place 30 mLs into feeding tube 2 (two) times daily. Patient taking differently: Place 30 mLs into feeding tube in the morning, at noon, in the evening, and at bedtime.  06/14/19  Yes Lavina Hamman, MD  aspirin EC 81 MG tablet Take 1 tablet (81 mg total) by mouth daily. 12/22/18  Yes Tobb, Kardie, DO  BREO ELLIPTA 100-25 MCG/INH AEPB Inhale 1 puff into the lungs at bedtime.  08/18/18  Yes [provider]  Calcium Carbonate-Vitamin D (CALCIUM 600/VITAMIN D PO) Take 1 tablet by mouth daily.   Yes [provider]  EPINEPHrine 0.3 mg/0.3 mL IJ SOAJ injection Inject 0.3 mg into the muscle as needed for anaphylaxis. 11/14/18  Yes [provider]  FLUoxetine (PROZAC) 20 MG capsule Take 20 mg by mouth daily. Pt currently not taking, as she feels she doesn't needs them at this time.   Yes [provider]  gabapentin (NEURONTIN) 300 MG capsule Take 300 mg by mouth 2 (two) times daily.  08/19/18  Yes [provider]  isosorbide mononitrate (IMDUR) 30 MG 24 hr tablet TAKE 1 TABLET BY MOUTH  DAILY Patient taking differently: Take 30 mg by mouth daily.  08/26/19  Yes Tobb, Kardie, DO  levothyroxine (SYNTHROID) 75 MCG tablet Take 75 mcg by mouth daily before breakfast.  08/23/18  Yes [provider]  metoprolol tartrate (LOPRESSOR) 25 MG tablet Take 0.5 tablets (12.5 mg total) by mouth 2 (two) times daily. 07/12/19 10/16/2019 Yes Tobb, Kardie,  DO  mometasone (NASONEX) 50 MCG/ACT nasal spray Place 2 sprays into the nose daily as needed (pain).    Yes [provider]  nitroGLYCERIN (NITROSTAT) 0.4 MG SL tablet Place 1 tablet (0.4 mg total) under the tongue every 5 (five) minutes as needed for chest pain. 07/12/19 10/10/19 Yes Tobb, Kardie, DO  oxyCODONE (OXY IR/ROXICODONE) 5 MG immediate release tablet Take 10 mg by mouth every 4 (four) hours as needed for moderate pain.  09/27/19  Yes [provider]  pantoprazole (PROTONIX) 40 MG tablet Take 40 mg by mouth daily.  10/14/18  Yes [provider]  potassium chloride 20 MEQ/15ML (10%) SOLN Take 20 mEq by mouth daily. 06/22/19  Yes [provider]  promethazine (PHENERGAN) 25 MG tablet Take 25 mg by mouth every 8 (eight) hours as needed for nausea or vomiting.  06/28/19  Yes [provider]  rosuvastatin (CRESTOR) 40 MG tablet TAKE 1 TABLET BY MOUTH  DAILY Patient taking differently: Take 40 mg by mouth daily.  08/26/19  Yes Tobb, Kardie, DO  Water For Irrigation, Sterile (FREE WATER) SOLN Place 200 mLs into feeding tube every 8 (eight) hours. 06/14/19  Yes Lavina Hamman, MD  Ascorbic Acid (VITAMIN C) 100 MG tablet Take 100 mg by mouth daily. Patient not taking: Reported on 10/03/2019    [provider]  oxyCODONE-acetaminophen (PERCOCET) 10-325 MG tablet Take 1 tablet by mouth every 4 (four) hours as needed for pain. Patient not taking: Reported on 10/03/2019  [provider]  rifaximin (XIFAXAN) 550 MG TABS tablet Take 550 mg by mouth See admin instructions. Take 1 tablet (550 mg) by mouth twice daily for 2 weeks, hold for 4 months then resume cycle Patient not taking: Reported on 10/03/2019    [provider]  sucralfate (CARAFATE) 1 GM/10ML suspension Take 10 mLs (1 g total) by mouth 4 (four) times daily -  with meals and at bedtime. Patient not taking: Reported on 10/03/2019 06/14/19   Lavina Hamman, MD     Vital Signs: BP  (!) 119/36   Pulse 72   Temp 98.9 F (37.2 C)   Resp (!) 23   Ht 5\' 2"  (1.575 m)   Wt 160 lb 7.9 oz (72.8 kg)   SpO2 93%   BMI 29.35 kg/m   Physical Exam Pulmonary:     Breath sounds: Rhonchi present.  Skin:    General: Skin is warm.     Comments: Site is clean and dry No bleeding No air leak at vac      Imaging: DG CHEST PORT 1 VIEW  Result Date: 11/12/2019 CLINICAL DATA:  Empyema. EXAM: PORTABLE CHEST 1 VIEW COMPARISON:  Radiograph and CT 11/08/2019 FINDINGS: Previous enteric tube is been removed, there is a new tracheostomy tube. A tracheostomy tube tip is at the thoracic inlet. The enteric tube has been removed. Left upper extremity PICC tip unchanged in the SVC. Right chest port unchanged. Pigtail catheter in the right hemithorax, unchanged in position. Right basilar chest tube also seen. Small amount of pleural fluid laterally in the right hemithorax which has slightly increased from prior exams. Slight increased opacity at the right lung base. Lucency at the right lung apex stable from prior CT. Volume loss in the left hemithorax with diffuse airspace opacity in left pleural effusion, not significantly changed. Stable heart size and mediastinal contours. IMPRESSION: 1. Tracheostomy tube tip at the thoracic inlet. 2. Slight increase in right pleural fluid laterally in the right hemithorax. Pigtail catheter and basilar chest tube remain in place. Slight increased opacity at the right lung base. 3. Diffuse left lung opacities and left pleural effusion are not significantly changed from prior. Electronically Signed   By: Keith Rake M.D.   On: 11/12/2019 16:15    Labs:  CBC: Recent Labs    11/09/19 0320 11/10/19 0427 11/11/19 0302 11/12/19 0312  WBC 15.2* 14.4* 11.0* 8.1  HGB 6.9* 8.8* 7.7* 7.1*  HCT 24.5* 30.3* 26.0* 24.3*  PLT 276 316 265 212    COAGS: Recent Labs    06/12/19 0020 06/12/19 0244 10/11/19 1000 11/06/2019 0653  INR 1.0  --  1.3*  --   APTT  --   30 >200* 29    BMP: Recent Labs    06/12/19 0610 06/12/19 0610 06/13/19 0329 06/13/19 0329 06/14/19 0401 06/14/19 0401 07/12/19 0854 10/11/19 1000 11/10/19 0427 11/11/19 0302 11/12/19 0312 11/13/19 0826  NA 133*   < > 139   < > 138  --  137   < > 148* 145 141 143  K 3.8   < > 3.6   < > 3.3*   < > 4.8   < > 4.4 4.0 3.7 5.5*  CL 101   < > 107   < > 106   < > 97   < > 94* 98 96* 103  CO2 24   < > 23   < > 23   < > 25   < > 42*  38* 36* 33*  GLUCOSE 135*   < > 107*   < > 100*  --  137*   < > 140* 112* 215* 262*  BUN 18   < > 9   < > 9  --  15   < > 56* 46* 40* 37*  CALCIUM 8.0*   < > 8.2*   < > 8.1*   < > 9.6   < > 8.6* 7.8* 7.4* 8.0*  CREATININE 1.19*   < > 0.96   < > 0.89   < > 0.93   < > 0.67 0.48 0.47 0.58  GFRNONAA 47*   < > >60   < > >60   < > 63   < > >60 >60 >60 >60  GFRAA 54*  --  >60  --  >60  --  73  --   --   --   --   --    < > = values in this interval not displayed.    LIVER FUNCTION TESTS: Recent Labs    10/31/19 0400 11/03/19 0627 11/07/19 0522 11/12/19 0312  BILITOT 0.6 0.6 0.4 0.9  AST 17 19 18  13*  ALT 12 13 15 13   ALKPHOS 54 50 55 48  PROT 5.0* 5.1* 6.0* 5.2*  ALBUMIN 1.4* 1.3* 1.6* 1.4*    Assessment and Plan:  Right loculated pleural effusion Rt anterior chest tube placed in IR 10/18 Plan per TCTS  Electronically Signed: Lavonia Drafts, PA-C 11/13/2019, 11:17 AM   I spent a total of 15 Minutes at the the patient's bedside AND on the patient's hospital floor or unit, greater than 50% of which was counseling/coordinating care for Rt chest tube

## 2019-11-13 NOTE — Progress Notes (Signed)
Patient ID: [name], female   DOB: 12-08-1950, 69 y.o.   MRN: [id number] EVENING ROUNDS NOTE :     [address]       [place] [address]             [phone]                 30 Days Post-Op Procedure(s) (LRB): XI ROBOTIC ASSISTED [place]KEOWN ESOPHAGECTOMY USING NIMS (N/A) VIDEO BRONCHOSCOPY (N/A) JEJUNOSTOMY PLACEMENT (N/A) INTERCOSTAL NERVE BLOCK (Right)  Total Length of Stay:  LOS: 30 days  BP (!) 114/35 (BP Location: Right Arm)   Pulse 89   Temp 99.6 F (37.6 C)   Resp (!) 24   Ht _0  (1.575 m)   Wt 72.8 kg   SpO2 91%   BMI 29.35 kg/m   .Intake/Output      11/06 0701 - 11/07 0700 11/07 0701 - 11/08 0700   I.V. (mL/kg) 2823.9 (38.8)    Other 210 0   NG/GT 500    IV Piggyback 320.1    Total Intake(mL/kg) 3854 (52.9) 0 (0)   Urine (mL/kg/hr) 1540 (0.9) 370 (0.5)   Drains  0   Stool 550 0   Chest Tube 40 0   Total Output 2130 370   Net +1724 -370          . sodium chloride 10 mL/hr at 11/07/19 2044  . amiodarone 30 mg/hr (11/13/19 1556)  . feeding supplement (VITAL 1.5 CAL) 20 mL/hr at 11/13/19 0600  . fentaNYL infusion INTRAVENOUS 175 mcg/hr (11/13/19 0700)  . meropenem (MERREM) IV 1 g (11/13/19 1455)  . norepinephrine (LEVOPHED) Adult infusion 9 mcg/min (11/13/19 0700)  . TPN ADULT (ION) 80 mL/hr at 11/13/19 0700  . TPN ADULT (ION)       Lab Results  Component Value Date   WBC 8.1 11/12/2019   HGB 7.1 (L) 11/12/2019   HCT 24.3 (L) 11/12/2019   PLT 212 11/12/2019   GLUCOSE 262 (H) 11/13/2019   CHOL  01/20/2007    96        ATP III CLASSIFICATION:  <200     mg/dL   Desirable  200-239  mg/dL   Borderline High  >=240    mg/dL   High          TRIG 113 11/13/2019   HDL 28 (L) 01/20/2007   LDLCALC  01/20/2007    36        Total Cholesterol/HDL:CHD Risk Coronary Heart Disease Risk Table                     Men   Women  1/2 Average Risk   3.4   3.3  Average Risk       5.0   4.4  2 X Average Risk   9.6   7.1  3 X Average Risk   23.4   11.0        Use the calculated Patient Ratio above and the CHD Risk Table to determine the patient's CHD Risk.        ATP III CLASSIFICATION (LDL):  <100     mg/dL   Optimal  100-129  mg/dL   Near or Above                    Optimal  130-159  mg/dL   Borderline  160-189  mg/dL   High  >190     mg/dL  Very High   ALT 13 11/12/2019   AST 13 (L) 11/12/2019   NA 143 11/13/2019   K 5.5 (H) 11/13/2019   CL 103 11/13/2019   CREATININE 0.58 11/13/2019   BUN 37 (H) 11/13/2019   CO2 33 (H) 11/13/2019   TSH 2.153 06/12/2019   INR 1.3 (H) 10/11/2019   HGBA1C 5.8 (H) 10/11/2019   No changes  Mental status unchanged, not fallowing commands randomly moves arms and legs    [name] MD  Beeper [phone] Office [phone] 11/13/2019 5:29 PM

## 2019-11-13 NOTE — Progress Notes (Signed)
Palliative:  HPI: 69 y.o. female with multiple medical problems including stage II squamous cell esophageal cancer (diagnosed 04/2019) status post neoadjuvant chemoradiation completed in July, status post PEG (05/27/19), history of tobacco abuse, CAD, diastolic dysfunction, PAF, diabetes, COPD with nocturnal O2, and OSA.  Patient was admitted to the hospital on 10/27/2019 for elective esophagectomy and J-tube placement.  Unfortunately, she subsequently developed hypoxic respiratory failure, PTX, and an anastomotic leak ultimately requiring a chest tube.  On 10/14 (postop day 5) she was transferred to the ICU for BiPAP and was subsequently intubated on 10/15.  Patient was extubated on 10/18 but required reintubation same day.  Unfortunately, her hospitalization has been further complicated sepsis, possible aspiration,  development of an R. sided empyema, J tube leak, ileus, sacral wound, and tracheal fistula. She had tracheostomy placed on 11/2.  Patient has been unable to wean from the ventilator.  Palliative care was consulted help address goals.  I met today at [name]'s bedside with her daughter, [name]. [name] is no responsive but has her eyes open at times (no tracking) and does have a few movements to her arms although weak and brief. I discussed further with [name] my concerns for her mother's condition and expectations moving forward. She has been through so much and has had so many complications. She is so very weak and frail. [name] is struggling as she desperately wants to take her mother home. She tells me that she is not ready to stop hoping for improvement. We did discuss concern for [name]'s quality of life and suffering in her current condition. I worry that improvement to return home is not a realistic expectation. [name] shares that she does not want her mother to die here. However, she knows that options are limited due to the severity of her illness. [name] also expresses  frustration that her siblings are placing all the decisions on her and she needs support and help to make these difficult decisions. I allowed [name]'s son to come up for support in hopes that [name]'s children can give her some support and help talk with her about what is best for [name].   I left them to visit privately as a family. This was an emotional visit for [name]'s grandson who had not seen her since her surgery. [name] will continue to try and speak with her siblings and consider how to move forward and what is best for her mother. I will continue discussions and support to [facility].   All questions/concerns addressed. Emotional support provided.   Exam: Opens eyes but no tracking. Not following commands. Small, infrequent movement of arms but non-purposeful. Tolerating vent support. Copious secretions. Abd soft. Extremities warm to touch.   Plan: - Ongoing goals of care discussions. Family struggling.   Farley, NP Palliative Medicine Team Pager [phone] (Please see [email or web address] for schedule) Team Phone [phone]    Greater than 50%  of this time was spent counseling and coordinating care related to the above assessment and plan

## 2019-11-13 NOTE — Progress Notes (Signed)
Patient ID: [name], female   DOB: May 13, 1950, 69 y.o.   MRN: [id number] TCTS DAILY ICU PROGRESS NOTE                   [address]            [place] [address]          [phone]   30 Days Post-Op Procedure(s) (LRB): XI ROBOTIC ASSISTED [place]OWN ESOPHAGECTOMY USING NIMS (N/A) VIDEO BRONCHOSCOPY (N/A) JEJUNOSTOMY PLACEMENT (N/A) INTERCOSTAL NERVE BLOCK (Right)  Total Length of Stay:  LOS: 30 days   Subjective: Patient remains trached on ventilator minimally responsive  Objective: Vital signs in last 24 hours: Temp:  [98 F (36.7 C)-99.5 F (37.5 C)] 98.9 F (37.2 C) (11/07 0811) Pulse Rate:  [66-94] 89 (11/07 0900) Cardiac Rhythm: Normal sinus rhythm;Bundle branch block (11/07 0800) Resp:  [12-31] 17 (11/07 0900) BP: (78-142)/(30-102) 122/42 (11/07 0900) SpO2:  [90 %-100 %] 92 % (11/07 0940) FiO2 (%):  [50 %-60 %] 50 % (11/07 0940) Weight:  [72.8 kg] 72.8 kg (11/07 0416)  Filed Weights   11/11/19 0426 11/12/19 0416 11/13/19 0416  Weight: 68.7 kg 69.1 kg 72.8 kg    Weight change: 3.7 kg   Hemodynamic parameters for last 24 hours:    Intake/Output from previous day: 11/06 0701 - 11/07 0700 In: 3854 [I.V.:2823.9; NG/GT:500; IV Piggyback:320.1] Out: 2130 [Urine:1540; Stool:550; Chest Tube:40]  Intake/Output this shift: Total I/O In: 0  Out: 270 [Urine:270]  Current Meds: Scheduled Meds: . aspirin  81 mg Per Tube Daily  . chlorhexidine gluconate (MEDLINE KIT)  15 mL Mouth Rinse BID  . Chlorhexidine Gluconate Cloth  6 each Topical Daily  . enoxaparin (LOVENOX) injection  60 mg Subcutaneous Q12H  . feeding supplement (PROSource TF)  45 mL Per Tube TID  . fentaNYL  1 patch Transdermal Q72H   And  . fentaNYL  1 patch Transdermal Q72H  . Shamila Lerch's butt cream   Topical BID  . insulin aspart  0-9 Units Subcutaneous Q4H  . insulin detemir  10 Units Subcutaneous BID  . [START ON 11/14/2019] levothyroxine  37.5 mcg Intravenous Daily  .  liver oil-zinc oxide   Topical BID  . mouth rinse  15 mL Mouth Rinse 10 times per day  . pantoprazole (PROTONIX) IV  40 mg Intravenous Q12H  . sodium chloride flush  10-40 mL Intracatheter Q12H  . sodium chloride flush  10-40 mL Intracatheter Q12H   Continuous Infusions: . sodium chloride 10 mL/hr at 11/07/19 2044  . amiodarone 30 mg/hr (11/13/19 0700)  . feeding supplement (VITAL 1.5 CAL) 20 mL/hr at 11/13/19 0600  . fentaNYL infusion INTRAVENOUS 175 mcg/hr (11/13/19 0700)  . meropenem (MERREM) IV Stopped (11/13/19 0602)  . norepinephrine (LEVOPHED) Adult infusion 9 mcg/min (11/13/19 0700)  . TPN ADULT (ION) 80 mL/hr at 11/13/19 0700   PRN Meds:.sodium chloride, fentaNYL (SUBLIMAZE) injection, fentaNYL (SUBLIMAZE) injection, ipratropium-albuterol, midazolam, sodium chloride flush, sodium chloride flush  General appearance: Minimally responsive does not follow commands some random arm movements Neurologic: As above Heart: regular rate and rhythm, S1, S2 normal, no murmur, click, rub or gallop  Lab Results: CBC: Recent Labs    11/11/19 0302 11/12/19 0312  WBC 11.0* 8.1  HGB 7.7* 7.1*  HCT 26.0* 24.3*  PLT 265 212   BMET:  Recent Labs    11/12/19 0312 11/13/19 0826  NA 141 143  K 3.7 5.5*  CL 96* 103  CO2 36* 33*  GLUCOSE 215* 262*  BUN 40* 37*  CREATININE 0.47 0.58  CALCIUM 7.4* 8.0*    CMET: Lab Results  Component Value Date   WBC 8.1 11/12/2019   HGB 7.1 (L) 11/12/2019   HCT 24.3 (L) 11/12/2019   PLT 212 11/12/2019   GLUCOSE 262 (H) 11/13/2019   CHOL  01/20/2007    96        ATP III CLASSIFICATION:  <200     mg/dL   Desirable  200-239  mg/dL   Borderline High  >=240    mg/dL   High          TRIG 113 11/13/2019   HDL 28 (L) 01/20/2007   LDLCALC  01/20/2007    36        Total Cholesterol/HDL:CHD Risk Coronary Heart Disease Risk Table                     Men   Women  1/2 Average Risk   3.4   3.3  Average Risk       5.0   4.4  2 X Average Risk   9.6    7.1  3 X Average Risk  23.4   11.0        Use the calculated Patient Ratio above and the CHD Risk Table to determine the patient's CHD Risk.        ATP III CLASSIFICATION (LDL):  <100     mg/dL   Optimal  100-129  mg/dL   Near or Above                    Optimal  130-159  mg/dL   Borderline  160-189  mg/dL   High  >190     mg/dL   Very High   ALT 13 11/12/2019   AST 13 (L) 11/12/2019   NA 143 11/13/2019   K 5.5 (H) 11/13/2019   CL 103 11/13/2019   CREATININE 0.58 11/13/2019   BUN 37 (H) 11/13/2019   CO2 33 (H) 11/13/2019   TSH 2.153 06/12/2019   INR 1.3 (H) 10/11/2019   HGBA1C 5.8 (H) 10/11/2019      PT/INR: No results for input(s): LABPROT, INR in the last 72 hours. Radiology: DG CHEST PORT 1 VIEW  Result Date: 11/12/2019 CLINICAL DATA:  Empyema. EXAM: PORTABLE CHEST 1 VIEW COMPARISON:  Radiograph and CT 11/08/2019 FINDINGS: Previous enteric tube is been removed, there is a new tracheostomy tube. A tracheostomy tube tip is at the thoracic inlet. The enteric tube has been removed. Left upper extremity PICC tip unchanged in the SVC. Right chest port unchanged. Pigtail catheter in the right hemithorax, unchanged in position. Right basilar chest tube also seen. Small amount of pleural fluid laterally in the right hemithorax which has slightly increased from prior exams. Slight increased opacity at the right lung base. Lucency at the right lung apex stable from prior CT. Volume loss in the left hemithorax with diffuse airspace opacity in left pleural effusion, not significantly changed. Stable heart size and mediastinal contours. IMPRESSION: 1. Tracheostomy tube tip at the thoracic inlet. 2. Slight increase in right pleural fluid laterally in the right hemithorax. Pigtail catheter and basilar chest tube remain in place. Slight increased opacity at the right lung base. 3. Diffuse left lung opacities and left pleural effusion are not significantly changed from prior. Electronically Signed    By: Keith Rake M.D.   On: 11/12/2019 16:15     Assessment/Plan: S/P  Procedure(s) (LRB): XI ROBOTIC ASSISTED [place]OWN ESOPHAGECTOMY USING NIMS (N/A) VIDEO BRONCHOSCOPY (N/A) JEJUNOSTOMY PLACEMENT (N/A) INTERCOSTAL NERVE BLOCK (Right) Continued nutritional metabolic problems-CCM correcting Little change in surgical condition    [name] 11/13/2019 10:20 AM

## 2019-11-13 NOTE — Progress Notes (Signed)
NAME:  [name], MRN:  [id number], DOB:  1950/04/13, LOS: 59 ADMISSION DATE:  10/13/2019, CONSULTATION DATE: 10/20/2019 REFERRING MD:  Dr. Kipp Brood, CHIEF COMPLAINT:  SOB   Brief History   [name] is a 69 y/o female with a PMHx of esophageal squamous cell carcinoma admitted on 10/08 for esophagectomy with interval development of respiratory distress requiring BiPAP and transfer to ICU.   History of present illness   [name] presented to [facility] for admission for elective esophagectomy on 10/08 that proceeded without any complications. She is currently POD 6.   Overnight, patient developed worsening work of breathing requiring NRB. She was noted to be diaphoretic with decreased lung sounds by the RN. Rapid response was called, who switched chest tube from water seal to suction. Air leak noted in [place]. CXR obtained overnight showed small right basilar pneumothorax with loculated pleural effusion, as well as interval development of extensive airspace infiltrate in the LUL. Repeat CXR this morning shows improvement in pneumothorax with no change in opacities. In the late morning, patient developed worsening respiratory distress requiring BiPAP placement and transfer to ICU for close monitoring.  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 Fib  Significant Hospital Events   10/08 >> Admitted to [facility] 10/14 >> Transferred to ICU for respiratory distress   Consults:  PCCM  Procedures:  10/08 > Esophagectomy 10/15 > ETT placed, Bronchoscopy     Significant Diagnostic Tests:  CXR 10/13 > Small right basilar pneumothorax, however, has increased in size since prior examination and there has now developed a small amount of laterally loculated pleural fluid. Extensive airspace infiltrate has now developed throughout the left upper lobe, likely infectious in the appropriate clinical setting.  CXR 10/14  > Right basilar pneumothorax is  smaller compared to earlier in the day. No tension component. Subcutaneous air present on the right. There is extensive airspace opacity throughout the left upper lobe and left perihilar regions, similar to earlier in the day. There is patchy atelectasis in the right mid lung and right base with suspected loculated pleural effusion on the right laterally ABD xray 10/20 > Mild diffuse air distension of the bowel, either reflecting ileus or low-grade obstruction.  Micro Data:  10/18 Pleural culture - moderate GNR> MODERATE ENTEROBACTER CLOACAE  MODERATE ENTEROBACTER AEROGENES  FEW PSEUDOMONAS AERUGINOSA  10/15 Respiratory culture - Pan-senstive PA Wound culture 10/18 >  10/21 blood> NG 10/30 resp> pseudomonas and enterobacter aerogenes 11/2> resp > rare pseudomonas aeruginosa, rare enterobacter aerogenes  Antimicrobials:  Zosyn 10/14 >> 10/21 Cefepime 10/21 >10/28, 10/29>11/2 Flagyl 10/21 >10/24 Fluconazole 10/22> 10/27 Meropenem 11/2 >>  Interim history/subjective:   Now on TPN and propofol off. No change in mental status.  Palliative care has reached out to family who continues to hope that patient with eventually recover.  Objective   Blood pressure (!) 114/34, pulse 86, temperature 98.9 F (37.2 C), resp. rate (!) 29, height 5\' 2"  (1.575 m), weight 72.8 kg, SpO2 93 %.    Vent Mode: PRVC FiO2 (%):  [60 %] 60 % Set Rate:  [26 bmp] 26 bmp Vt Set:  [300 mL] 300 mL PEEP:  [8 cmH20] 8 cmH20 Plateau Pressure:  [24 cmH20-25 cmH20] 25 cmH20   Intake/Output Summary (Last 24 hours) at 11/13/2019 0901 Last data filed at 11/13/2019 0800 Gross per 24 hour  Intake 3588.54 ml  Output 2225 ml  Net 1363.54 ml    [employer]  11/11/19 0426 11/12/19 0416 11/13/19 0416  Weight: 68.7 kg 69.1 kg 72.8 kg   Physical Exam: General: Critically ill, elderly, frail  Trach/vent multiple chest tubes with minimal drainage.  HEENT: Edentulous. Significant temporal muscle wasting/ trach  secure Neuro: Awake. Not following commands. Moving spontaneously  PULM: Symmetrical chest expansion. Scattered Rhonchi. Coarse throughout  GI: Soft round. leaking J tube. Rectal tube collecting fluid that looks largely like EN with some fecal material.  Extremities: Muscle wasting BUE BLE. LUE PICC  Skin: Pale skin. Dry. Sacral wound with dressing intact. Tunneling neck wound adjacent to tracheostomy. Scattered ecchymosis.    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 Weaning sedation should help. -Continue chest tube drainage. - Wean FiO2 to 0.4 then attempt SBT. - Continue TPN.  - Continue IV amiodarone for now. - Continue goals of care conversations with family.     Daily Goals Checklist  Pain/Anxiety/Delirium protocol (if indicated): fentanyl patch and prn lorazepam. Wean fentanyl drip off. VAP protocol (if indicated): bundle in place.  Respiratory support goals: Wean FiO2 to 0.4 then attempt SBT. Blood pressure target: Wean NE to off  DVT prophylaxis: heparin Nutrition Status: TPN GI prophylaxis: pantoprazole Fluid status goals: allow autoregulation Urinary catheter: Assessment of intravascular volume Central lines: CVC for TPN Glucose control: SSI and basal bolus insulin.  Mobility/therapy  needs: bedrest  Antibiotic de-escalation: meropenem for 7 days.  Home medication reconciliation: on hold Daily labs: CMP and CBC Code Status: full, Family Communication: updated by RN today. Disposition: ICU   CRITICAL CARE Performed by: Kipp Brood   Total critical care time: 40 minutes  Critical care time was exclusive of separately billable procedures and treating other patients.  Critical care was necessary to treat or prevent imminent or life-threatening deterioration.  Critical care was time spent personally by me on the following activities: development of treatment plan with patient and/or surrogate as well as nursing, discussions with consultants, evaluation of patient's response to treatment, examination of patient, obtaining history from patient or surrogate, ordering and performing treatments and interventions, ordering and review of laboratory studies, ordering and review of radiographic studies, pulse oximetry, re-evaluation of patient's condition and participation in multidisciplinary rounds.  Kipp Brood, MD [facility] ICU Physician [place]  Pager: [phone] Mobile: [phone] After hours: [phone].

## 2019-11-13 NOTE — Progress Notes (Signed)
PHARMACY - TOTAL PARENTERAL NUTRITION CONSULT NOTE  Indication: Intolerance to EN / Prolonged ileus  Patient Measurements: Height: 5\' 2"  (157.5 cm) Weight: 72.8 kg (160 lb 7.9 oz) IBW/kg (Calculated) : 50.1 TPN AdjBW (KG): 56.8 Body mass index is 29.35 kg/m.  Weight = 73 kg on admit  Assessment:  [name] presented on 10/24/2019 for bronch, esophagectomy and J-tube placement due to esophageal cancer.  Patient was started on EN post-op on 10/15/19 and tolerated goal rate.  She also passed her swallow evaluation and started on a CLD with nocturnal TF.  She unfortunately developed right PTX and subsequently require intubation, transfer to the ICU, and chest tube placement.  TF has been interrupted frequently since 10/24/19 due to drainage around J-tube and suspected ileus vs pSBO.  Pharmacy consulted for TPN management.  11/4 Patient accidentally pulled out NGT; tube feeds leaking around Jtube sight - rate decreased to 20 ml/hr and stool appearance may represent malabsorption. Pharmacy reconsulted to restart TPN   Glucose / Insulin: no hx DM, A1c 5.8% - CBGs/24h: 140-190 Required 11 units SSI, Levemir 10 units BID (previously had insulin in TPN will hold off adding for now given low requirements of SSI) Electrolytes: Na 141, K up to 5.5 << 3.7 (goal >/= 4 for ileus) s/p total of 60 mEq PT on 11/6, will reduce K in TPN 11/7 (cannot fully remove due to Phos requirements). CL up to 103, CO2 down to 33 - on max chloride, from 11/6: Phos 2.5 and Mag 2  (goal >/= 2 for ileus) Renal: SCr up 0.58 << 0.47, BUN 37 LFTs / TGs: LFTs / tbili WNL, TG 711 and 616 on 11/6 - lipids removed from TPN, TG now down to 113 - will resume lipids at ~15% of TPN. Propofol stopped. Prealbumin / albumin: Prealbumin 12.5 (11/6) << 16 (11/1), alb 1.4 Intake / Output; MIVF: UOP 0.7 ml/kg/hr, CT O/P 56mL, Jtube O/P 62mL, Stool/24h: 550 cc. NGT pulled out GI Imaging:  10/24 CT abd: ?focal enteritis, postoperative ileus Surgeries /  Procedures: none since TPN  Central access: PICC placed 10/21/19 TPN start date: 10/30/19  Nutritional Goals (per RD rec on 11/4): 1800-2000 kCal, 100-120gm protein, >/= 1.8L fluid per day  Current Nutrition:  TPN at 80 cc/hr (no lipids) provides ~1800 kcal and ~119g protein meeting 100% of estimated needs  Per discussion with RD on 11/5 - will plan to meet 100% of nutritional needs with TPN due to leaking around J-tube as concern for malabsorption/maldigestion expressed per the RD  Plan:  TPN at 75 cc/hr will provide 1818 kcal and 110g protein meeting 100% of estimated needs  SMOFlipids resumed 11/7 at lower amount - 27g lipids (~15% of total kcal)  Electrolytes in TPN:no Na, will reduce to 22 mEq/L KCl, Ca 29mEq/L, Mag 80mEq/L,will reduce Phos to 15 mmol/L (to limit K), cont max chloride Add standard MVI and trace elements to TPN Continue CVTS SSI Q4H Continue Levemir 10 units SQ BID - will not add insulin to TPN due to low requirement of SSI F/U AM labs, volume status, CBGs, advance TPN as tolerated    Thank you for allowing pharmacy to be a part of this patient's care.  Alycia Rossetti, PharmD, BCPS Clinical Pharmacist Clinical phone for 11/13/2019: [phone] 11/13/2019 7:21 AM   **Pharmacist phone directory can now be found on [email or web address] ([email or web address]).  Listed under [place].

## 2019-11-13 NOTE — Plan of Care (Signed)
  Problem: Clinical Measurements: Goal: Ability to maintain clinical measurements within normal limits will improve Outcome: Not Progressing Difficulty weaning Levo drip Goal: Will remain free from infection Outcome: Progressing Goal: Diagnostic test results will improve Outcome: Progressing Goal: Respiratory complications will improve Outcome: Not Progressing Trach to Vent, Unable to wean at present Goal: Cardiovascular complication will be avoided Outcome: Progressing   Problem: Activity: Goal: Risk for activity intolerance will decrease Outcome: Not Progressing Bedrest, active ROM upper extremities   Problem: Nutrition: Goal: Adequate nutrition will be maintained Outcome: Progressing   Problem: Nutrition: Goal: Adequate nutrition will be maintained Outcome: Progressing   Problem: Elimination: Goal: Will not experience complications related to bowel motility Outcome: Not Progressing Loose stools continue/ TPN and trickle J tube feeds Goal: Will not experience complications related to urinary retention Outcome: Progressing   Problem: Pain Managment: Goal: General experience of comfort will improve Outcome: Progressing   Problem: Safety: Goal: Ability to remain free from injury will improve Outcome: Progressing   Problem: Skin Integrity: Goal: Risk for impaired skin integrity will decrease Outcome: Not Progressing Stage 2 on coccyx, very slow healing.

## 2019-11-14 DIAGNOSIS — Z9911 Dependence on respirator [ventilator] status: Secondary | ICD-10-CM

## 2019-11-14 DIAGNOSIS — J9601 Acute respiratory failure with hypoxia: Secondary | ICD-10-CM | POA: Diagnosis not present

## 2019-11-14 LAB — PREPARE RBC (CROSSMATCH)

## 2019-11-14 LAB — CBC
HCT: 24.8 % — ABNORMAL LOW (ref 36.0–46.0)
Hemoglobin: 6.9 g/dL — CL (ref 12.0–15.0)
MCH: 28.6 pg (ref 26.0–34.0)
MCHC: 27.8 g/dL — ABNORMAL LOW (ref 30.0–36.0)
MCV: 102.9 fL — ABNORMAL HIGH (ref 80.0–100.0)
Platelets: 223 10*3/uL (ref 150–400)
RBC: 2.41 MIL/uL — ABNORMAL LOW (ref 3.87–5.11)
RDW: 15.9 % — ABNORMAL HIGH (ref 11.5–15.5)
WBC: 8.9 10*3/uL (ref 4.0–10.5)
nRBC: 0 % (ref 0.0–0.2)

## 2019-11-14 LAB — GLUCOSE, CAPILLARY
Glucose-Capillary: 127 mg/dL — ABNORMAL HIGH (ref 70–99)
Glucose-Capillary: 136 mg/dL — ABNORMAL HIGH (ref 70–99)
Glucose-Capillary: 156 mg/dL — ABNORMAL HIGH (ref 70–99)
Glucose-Capillary: 162 mg/dL — ABNORMAL HIGH (ref 70–99)
Glucose-Capillary: 164 mg/dL — ABNORMAL HIGH (ref 70–99)
Glucose-Capillary: 167 mg/dL — ABNORMAL HIGH (ref 70–99)

## 2019-11-14 LAB — DIFFERENTIAL
Abs Immature Granulocytes: 0 10*3/uL (ref 0.00–0.07)
Basophils Absolute: 0.1 10*3/uL (ref 0.0–0.1)
Basophils Relative: 1 %
Eosinophils Absolute: 0 10*3/uL (ref 0.0–0.5)
Eosinophils Relative: 0 %
Lymphocytes Relative: 4 %
Lymphs Abs: 0.4 10*3/uL — ABNORMAL LOW (ref 0.7–4.0)
Monocytes Absolute: 0.2 10*3/uL (ref 0.1–1.0)
Monocytes Relative: 2 %
Neutro Abs: 8.3 10*3/uL — ABNORMAL HIGH (ref 1.7–7.7)
Neutrophils Relative %: 93 %
nRBC: 0 /100 WBC

## 2019-11-14 LAB — PREALBUMIN: Prealbumin: 6.3 mg/dL — ABNORMAL LOW (ref 18–38)

## 2019-11-14 LAB — COMPREHENSIVE METABOLIC PANEL
ALT: 15 U/L (ref 0–44)
AST: 15 U/L (ref 15–41)
Albumin: 1.3 g/dL — ABNORMAL LOW (ref 3.5–5.0)
Alkaline Phosphatase: 50 U/L (ref 38–126)
Anion gap: 5 (ref 5–15)
BUN: 44 mg/dL — ABNORMAL HIGH (ref 8–23)
CO2: 33 mmol/L — ABNORMAL HIGH (ref 22–32)
Calcium: 8.2 mg/dL — ABNORMAL LOW (ref 8.9–10.3)
Chloride: 108 mmol/L (ref 98–111)
Creatinine, Ser: 0.65 mg/dL (ref 0.44–1.00)
GFR, Estimated: >60 mL/min (ref >60)
Glucose, Bld: 188 mg/dL — ABNORMAL HIGH (ref 70–99)
Potassium: 4.9 mmol/L (ref 3.5–5.1)
Sodium: 146 mmol/L — ABNORMAL HIGH (ref 135–145)
Total Bilirubin: 0.2 mg/dL — ABNORMAL LOW (ref 0.3–1.2)
Total Protein: 5.7 g/dL — ABNORMAL LOW (ref 6.5–8.1)

## 2019-11-14 LAB — TRIGLYCERIDES: Triglycerides: 128 mg/dL (ref <150)

## 2019-11-14 LAB — MAGNESIUM: Magnesium: 2.3 mg/dL (ref 1.7–2.4)

## 2019-11-14 LAB — HEMOGLOBIN AND HEMATOCRIT, BLOOD
HCT: 27.7 % — ABNORMAL LOW (ref 36.0–46.0)
Hemoglobin: 8 g/dL — ABNORMAL LOW (ref 12.0–15.0)

## 2019-11-14 LAB — PHOSPHORUS: Phosphorus: 3 mg/dL (ref 2.5–4.6)

## 2019-11-14 MED ORDER — TRAVASOL 10 % IV SOLN
INTRAVENOUS | Status: AC
  Filled 2019-11-14: qty 1116

## 2019-11-14 MED ORDER — SODIUM CHLORIDE 0.9% IV SOLUTION: Freq: Once | INTRAVENOUS | Status: AC

## 2019-11-14 MED ORDER — CHLORHEXIDINE GLUCONATE CLOTH 2 % EX PADS
6.0000 | MEDICATED_PAD | Freq: Every day | CUTANEOUS | Status: DC
  Administered 2019-11-14 – 2019-11-17 (×4): 6 via TOPICAL

## 2019-11-14 NOTE — Progress Notes (Signed)
This chaplain responded to the  PMT spiritual care referral for the Pt. daughter-Margaret.  The chaplain learned from the Pt. RN-Mike, Joycelyn Schmid has not visited today.  The chaplain can be paged if Joycelyn Schmid arrives today before 5pm.

## 2019-11-14 NOTE — Plan of Care (Signed)
  Problem: Clinical Measurements: Goal: Ability to maintain clinical measurements within normal limits will improve Outcome: Progressing Goal: Will remain free from infection Outcome: Progressing Goal: Diagnostic test results will improve Outcome: Progressing Goal: Respiratory complications will improve Outcome: Progressing Goal: Cardiovascular complication will be avoided Outcome: Progressing   Problem: Nutrition: Goal: Adequate nutrition will be maintained Outcome: Progressing   Problem: Pain Managment: Goal: General experience of comfort will improve Outcome: Progressing   Problem: Safety: Goal: Ability to remain free from injury will improve Outcome: Progressing   Problem: Skin Integrity: Goal: Risk for impaired skin integrity will decrease Outcome: Progressing   

## 2019-11-14 NOTE — Progress Notes (Signed)
OT Cancellation Note  Patient Details Name: [name] MRN: [id number] DOB: 20-Apr-1950   Cancelled Treatment:    Reason Eval/Treat Not Completed: Medical issues which prohibited therapy (Pt with hgb of 6.9.)  Malka So 11/14/2019, 12:12 PM  Nestor Lewandowsky, OTR/L Acute Rehabilitation Services Pager: [phone] Office: [phone]

## 2019-11-14 NOTE — Progress Notes (Signed)
[address]       [place] [address]             [phone]                 31 Days Post-Op Procedure(s) (LRB): XI ROBOTIC ASSISTED [place] ESOPHAGECTOMY USING NIMS (N/A) VIDEO BRONCHOSCOPY (N/A) JEJUNOSTOMY PLACEMENT (N/A) INTERCOSTAL NERVE BLOCK (Right)   Events: Off pressors _______________________________________________________________ Vitals: BP (!) 122/41   Pulse 89   Temp 99.5 F (37.5 C) (Axillary)   Resp (!) 33   Ht 5\' 2"  (1.575 m)   Wt 75.3 kg   SpO2 93%   BMI 30.36 kg/m   - Neuro: sedated. arousable  - Cardiovascular: Sinus  Drips:     - Pulm:  Vent Mode: PRVC FiO2 (%):  [50 %] 50 % Set Rate:  [26 bmp] 26 bmp Vt Set:  [300 mL] 300 mL PEEP:  [8 cmH20] 8 cmH20 Plateau Pressure:  [20 cmH20-23 cmH20] 21 cmH20  ABG    Component Value Date/Time   PHART 7.372 10/26/2019 0749   PCO2ART 54.4 (H) 10/26/2019 0749   PO2ART 105 10/26/2019 0749   HCO3 31.4 (H) 10/26/2019 0749   TCO2 33 (H) 10/26/2019 0749   ACIDBASEDEF 7.8 (H) 10/26/2019 2050   O2SAT 98.0 10/26/2019 0749    - Abd: soft.  J tube in place  - Extremity: warm  .Intake/Output      11/07 0701 - 11/08 0700 11/08 0701 - 11/09 0700   I.V. (mL/kg) 2638.1 (35)    Other 0    NG/GT 280    IV Piggyback 310.6    Total Intake(mL/kg) 3228.6 (42.9)    Urine (mL/kg/hr) 1695 (0.9)    Drains 0    Stool 500    Chest Tube 70    Total Output 2265    Net +963.6            _______________________________________________________________ Labs: CBC Latest Ref Rng & Units 11/14/2019 11/12/2019 11/11/2019  WBC 4.0 - 10.5 K/uL 8.9 8.1 11.0(H)  Hemoglobin 12.0 - 15.0 g/dL 6.9(LL) 7.1(L) 7.7(L)  Hematocrit 36 - 46 % 24.8(L) 24.3(L) 26.0(L)  Platelets 150 - 400 K/uL 223 212 265   CMP Latest Ref Rng & Units 11/14/2019 11/13/2019 11/12/2019  Glucose 70 - 99 mg/dL 188(H) 262(H) 215(H)  BUN 8 - 23 mg/dL 44(H) 37(H) 40(H)  Creatinine 0.44 - 1.00 mg/dL 0.65 0.58 0.47  Sodium 135 - 145  mmol/L 146(H) 143 141  Potassium 3.5 - 5.1 mmol/L 4.9 5.5(H) 3.7  Chloride 98 - 111 mmol/L 108 103 96(L)  CO2 22 - 32 mmol/L 33(H) 33(H) 36(H)  Calcium 8.9 - 10.3 mg/dL 8.2(L) 8.0(L) 7.4(L)  Total Protein 6.5 - 8.1 g/dL 5.7(L) - 5.2(L)  Total Bilirubin 0.3 - 1.2 mg/dL 0.2(L) - 0.9  Alkaline Phos 38 - 126 U/L 50 - 48  AST 15 - 41 U/L 15 - 13(L)  ALT 0 - 44 U/L 15 - 13      _______________________________________________________________  Assessment and Plan: POD 31 s/p Robotic 3 field esophagectomy.  Respiratory failure, cervical anastamotic leak.  Tracheal mucosal ulceration with fistula  Neuro: pain controlled CV: sinus, continue amio gtt for now. Off pressors Pulm: will plan to exchange trach for longer device to cover the fistula Renal: Creatinine stable GI: will start TPN.  Severely malnourished with an albumin on 1.3.  Pt is not tolerating the tubefeeds given hx of small bowel resection Heme: receiving 1 unit  of blood ID: wet to dry dressing to cervical incision.  Antibiotics restarted.   Endo: SSI  Dispo: I had a long discussion with the patients daughter, and boyfriend in which I explained the gravity of her clinical status.  I further explained that she likely would not survive this hospitalization given her poor wound healing, and nutrition.  The family did not want to make her DNR, and still want everything done.  I will consult palliative care today.  Melodie Bouillon, MD

## 2019-11-14 NOTE — Progress Notes (Signed)
NAME:  [name], MRN:  [id number], DOB:  09-11-1950, LOS: 69 ADMISSION DATE:  10/13/2019, CONSULTATION DATE: 10/20/2019 REFERRING MD:  Dr. Kipp Brood, CHIEF COMPLAINT:  SOB   Brief History   [name]. Yera is a 69 y/o female with a PMHx of esophageal squamous cell carcinoma admitted on 10/08 for esophagectomy with interval development of respiratory distress requiring BiPAP and transfer to ICU.   History of present illness   Mrs. Ambika presented to [facility] for admission for elective esophagectomy on 10/08 that proceeded without any complications. She is currently POD 6.   Overnight, patient developed worsening work of breathing requiring NRB. She was noted to be diaphoretic with decreased lung sounds by the RN. Rapid response was called, who switched chest tube from water seal to suction. Air leak noted in [place]. CXR obtained overnight showed small right basilar pneumothorax with loculated pleural effusion, as well as interval development of extensive airspace infiltrate in the LUL. Repeat CXR this morning shows improvement in pneumothorax with no change in opacities. In the late morning, patient developed worsening respiratory distress requiring BiPAP placement and transfer to ICU for close monitoring.  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 Fib  Significant Hospital Events   10/08 >> Admitted to [facility] 10/14 >> Transferred to ICU for respiratory distress   Consults:  PCCM  Procedures:  10/08 > Esophagectomy 10/15 > ETT placed, Bronchoscopy     Significant Diagnostic Tests:  CXR 10/13 > Small right basilar pneumothorax, however, has increased in size since prior examination and there has now developed a small amount of laterally loculated pleural fluid. Extensive airspace infiltrate has now developed throughout the left upper lobe, likely infectious in the appropriate clinical setting.  CXR 10/14  > Right basilar pneumothorax is  smaller compared to earlier in the day. No tension component. Subcutaneous air present on the right. There is extensive airspace opacity throughout the left upper lobe and left perihilar regions, similar to earlier in the day. There is patchy atelectasis in the right mid lung and right base with suspected loculated pleural effusion on the right laterally ABD xray 10/20 > Mild diffuse air distension of the bowel, either reflecting ileus or low-grade obstruction.  Micro Data:  10/18 Pleural culture - moderate GNR> MODERATE ENTEROBACTER CLOACAE  MODERATE ENTEROBACTER AEROGENES  FEW PSEUDOMONAS AERUGINOSA  10/15 Respiratory culture - Pan-senstive PA Wound culture 10/18 >  10/21 blood> NG 10/30 resp> pseudomonas and enterobacter aerogenes 11/2> resp > rare pseudomonas aeruginosa, rare enterobacter aerogenes  Antimicrobials:  Zosyn 10/14 >> 10/21 Cefepime 10/21 >10/28, 10/29>11/2 Flagyl 10/21 >10/24 Fluconazole 10/22> 10/27 Meropenem 11/2 >>  Interim history/subjective:  No acute events.  Palliative met with family, ongoing discussions regarding goals of care.  Objective   Blood pressure (!) 117/39, pulse 84, temperature 97.7 F (36.5 C), temperature source Axillary, resp. rate (!) 25, height 5' 2"  (1.575 m), weight 75.3 kg, SpO2 92 %.    Vent Mode: PRVC FiO2 (%):  [50 %] 50 % Set Rate:  [26 bmp] 26 bmp Vt Set:  [300 mL] 300 mL PEEP:  [8 cmH20] 8 cmH20 Plateau Pressure:  [20 cmH20-23 cmH20] 21 cmH20   Intake/Output Summary (Last 24 hours) at 11/14/2019 0912 Last data filed at 11/14/2019 0834 Gross per 24 hour  Intake 3543.64 ml  Output 1995 ml  Net 1548.64 ml    Filed Weights   11/12/19 0416 11/13/19 0416 11/14/19 0517  Weight: 69.1 kg  72.8 kg 75.3 kg   Physical Exam: General: Critically ill, elderly, frail, chronically ill appearing female HEENT: Edentulous. Significant temporal muscle wasting.  Ttrach secure Neuro: Eyes open but not following commands. Does move  spontaneously  PULM: Symmetrical chest expansion. Scattered Rhonchi. Coarse throughout  GI: Soft, BS hypoactive. Extremities: Muscle wasting BUE BLE. LUE PICC  Skin: Pale skin. Dry. Sacral wound with dressing intact. Tunneling neck wound adjacent to tracheostomy. Scattered ecchymosis.    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  Fully support DNR and would advocate for transition to comfort care   Montey Hora, [place] [facility] 11/14/2019, 9:17 AM

## 2019-11-14 NOTE — Progress Notes (Signed)
eLink Physician-Brief Progress Note Patient Name: [name] DOB: 1950-10-07 MRN: [id number]   Date of Service  11/14/2019  HPI/Events of Note  Hemoglobin 6.9 gm %  eICU Interventions  Transfusion of 1 unit PRBC ordered.        Kerry Kass Carletta Feasel 11/14/2019, 3:43 AM

## 2019-11-14 NOTE — Progress Notes (Signed)
Palliative:  [name] continues to be sedated on vent. She is more alert today and may track on occasion but not consistently. She does not follow any commands. Her arms are lifted in the air with non-purposeful movement. No family at bedside. Discussed with Dr. Lynetta Mare.   I called to follow up with daughter, [name]. Her significant other answered the phone and informs me that [name] is sleeping and has not made any decisions yet. He reports that [name] did not sleep well last night and the decisions are weighing heavily on her. I expressed understanding and informed him that I will continue to touch base with [name] for any further questions and additional support.   No charge  Vinie Sill, NP Palliative Medicine Team Pager [phone] (Please see [email or web address] for schedule) Team Phone [phone]

## 2019-11-14 NOTE — Progress Notes (Signed)
PHARMACY - TOTAL PARENTERAL NUTRITION CONSULT NOTE  Indication: Intolerance to EN / Prolonged ileus  Patient Measurements: Height: 5\' 2"  (157.5 cm) Weight: 75.3 kg (166 lb 0.1 oz) IBW/kg (Calculated) : 50.1 TPN AdjBW (KG): 56.8 Body mass index is 30.36 kg/m.  Weight = 73 kg on admit  Assessment:  [name] presented on 10/27/2019 for bronch, esophagectomy and J-tube placement due to esophageal cancer.  Patient was started on EN post-op on 10/15/19 and tolerated goal rate.  She also passed her swallow evaluation and started on a CLD with nocturnal TF.  She unfortunately developed right PTX and subsequently require intubation, transfer to the ICU, and chest tube placement.  TF has been interrupted frequently since 10/24/19 due to drainage around J-tube and suspected ileus vs pSBO.  Pharmacy consulted for TPN management.  11/4 Patient accidentally pulled out NGT; tube feeds leaking around Jtube sight - rate decreased to 20 ml/hr and stool appearance may represent malabsorption. Pharmacy reconsulted to restart TPN   Glucose / Insulin: no hx DM, A1c 5.8% - CBGs/24h: 160-260 Required 12 units SSI, Levemir 10 units BID (previously had insulin in TPN will hold off adding for now given low requirements of SSI) Electrolytes: Na 146, K 4.9 (goal >/= 4 for ileus), will reduce K in TPN 11/7 (cannot fully remove due to Phos requirements). CL up to 108, CO2 down to 33 - on max chloride: Phos 3 and Mag 2.3  (goal >/= 2 for ileus) Renal: SCr up 0.65<<0.58 << 0.47, BUN 44<<37 LFTs / TGs: LFTs / tbili WNL, TG 128 - elevated on 11/6 lipids removed from TPN, lipids resumed 11/7 at ~15% of TPN. Propofol stopped. Prealbumin / albumin: Prealbumin 6.3 (11/8) <<12.5 (11/6) << 16 (11/1), alb 1.3 Intake / Output; MIVF: UOP 0.9 ml/kg/hr, CT O/P 71mL, Jtube O/P 57mL, Stool/24h: 500 cc. NGT pulled out GI Imaging:  10/24 CT abd: ?focal enteritis, postoperative ileus Surgeries / Procedures: none since TPN  Central access: PICC  placed 10/21/19 TPN start date: 10/30/19  Nutritional Goals (per RD rec on 11/4): 1800-2000 kCal, 100-120gm protein, >/= 1.8L fluid per day  Current Nutrition:  TPN at 80 cc/hr (no lipids) provides ~1800 kcal and ~119g protein meeting 100% of estimated needs  Per discussion with RD on 11/5 - will plan to meet 100% of nutritional needs with TPN due to leaking around J-tube as concern for malabsorption/maldigestion expressed per the RD  Plan:  TPN at 75 cc/hr will provide 1818 kcal and 110g protein meeting 100% of estimated needs  SMOFlipids resumed 11/7 at lower amount - 27g lipids (~15% of total kcal)  Electrolytes in TPN:no Na,K reduced 11/7 to 22 mEq/L KCl, Ca 55mEq/L, Mag 94mEq/L,Phos reduced 11/7 to Phos to 15 mmol/L (to limit K), cont max chloride Add standard MVI and trace elements to TPN Continue CVTS SSI Q4H Continue Levemir 10 units SQ BID - will not add insulin to TPN due to low requirement of SSI F/U AM labs, volume status, CBGs Can probably increase lipid content further if triglycerides remain low after a full 24 hours of TPN at 15% of calorie needs  Thank you for allowing pharmacy to be a part of this patient's care.  Alanda Slim, PharmD, [facility] Clinical Pharmacist Please see AMION for all Pharmacists' Contact Phone Numbers 11/14/2019, 7:19 AM    **Pharmacist phone directory can now be found on [email or web address] ([email or web address]).  Listed under [place].

## 2019-11-14 NOTE — Progress Notes (Signed)
PT Cancellation Note  Patient Details Name: [name] MRN: [id number] DOB: 1950-07-31   Cancelled Treatment:     RN requesting to hold till hemoglobin results come back, will attempt in PM time permitting  Lyanne Co, DPT Acute Rehabilitation Services [phone]   [place] 11/14/2019, 9:58 AM

## 2019-11-15 DIAGNOSIS — Z9911 Dependence on respirator [ventilator] status: Secondary | ICD-10-CM | POA: Diagnosis not present

## 2019-11-15 DIAGNOSIS — Z515 Encounter for palliative care: Secondary | ICD-10-CM | POA: Diagnosis not present

## 2019-11-15 DIAGNOSIS — Z7189 Other specified counseling: Secondary | ICD-10-CM | POA: Diagnosis not present

## 2019-11-15 DIAGNOSIS — C159 Malignant neoplasm of esophagus, unspecified: Secondary | ICD-10-CM | POA: Diagnosis not present

## 2019-11-15 DIAGNOSIS — J9620 Acute and chronic respiratory failure, unspecified whether with hypoxia or hypercapnia: Secondary | ICD-10-CM | POA: Diagnosis not present

## 2019-11-15 LAB — GLUCOSE, CAPILLARY
Glucose-Capillary: 128 mg/dL — ABNORMAL HIGH (ref 70–99)
Glucose-Capillary: 153 mg/dL — ABNORMAL HIGH (ref 70–99)
Glucose-Capillary: 147 mg/dL — ABNORMAL HIGH (ref 70–99)
Glucose-Capillary: 138 mg/dL — ABNORMAL HIGH (ref 70–99)
Glucose-Capillary: 123 mg/dL — ABNORMAL HIGH (ref 70–99)

## 2019-11-15 LAB — TYPE AND SCREEN
ABO/RH(D): A POS
Antibody Screen: NEGATIVE
Unit division: 0

## 2019-11-15 LAB — BASIC METABOLIC PANEL
Anion gap: 5 (ref 5–15)
BUN: 43 mg/dL — ABNORMAL HIGH (ref 8–23)
CO2: 32 mmol/L (ref 22–32)
Calcium: 8 mg/dL — ABNORMAL LOW (ref 8.9–10.3)
Chloride: 108 mmol/L (ref 98–111)
Creatinine, Ser: 0.55 mg/dL (ref 0.44–1.00)
GFR, Estimated: >60 mL/min (ref >60)
Glucose, Bld: 164 mg/dL — ABNORMAL HIGH (ref 70–99)
Potassium: 4.4 mmol/L (ref 3.5–5.1)
Sodium: 145 mmol/L (ref 135–145)

## 2019-11-15 LAB — PHOSPHORUS: Phosphorus: 3.4 mg/dL (ref 2.5–4.6)

## 2019-11-15 LAB — TRIGLYCERIDES: Triglycerides: 117 mg/dL (ref <150)

## 2019-11-15 LAB — BPAM RBC
Blood Product Expiration Date: 202111272359
ISSUE DATE / TIME: 202111080616
Unit Type and Rh: 6200

## 2019-11-15 LAB — MAGNESIUM: Magnesium: 2.2 mg/dL (ref 1.7–2.4)

## 2019-11-15 MED ORDER — FENTANYL BOLUS VIA INFUSION
25.0000 ug | INTRAVENOUS | Status: DC | PRN
  Filled 2019-11-15: qty 100

## 2019-11-15 MED ORDER — TRAVASOL 10 % IV SOLN
INTRAVENOUS | Status: DC
  Filled 2019-11-15: qty 1190.4

## 2019-11-15 MED ORDER — FENTANYL 2500MCG IN NS 250ML (10MCG/ML) PREMIX INFUSION
0.0000 ug/h | INTRAVENOUS | Status: DC
  Administered 2019-11-15: 100 ug/h via INTRAVENOUS
  Filled 2019-11-15: qty 250

## 2019-11-15 MED ORDER — QUETIAPINE FUMARATE 25 MG PO TABS
25.0000 mg | ORAL_TABLET | Freq: Every day | ORAL | Status: DC
  Administered 2019-11-15 – 2019-11-17 (×3): 25 mg
  Filled 2019-11-15 (×3): qty 1

## 2019-11-15 MED ORDER — AMIODARONE HCL 200 MG PO TABS
200.0000 mg | ORAL_TABLET | Freq: Every day | ORAL | Status: DC
  Administered 2019-11-15 – 2019-11-17 (×3): 200 mg
  Filled 2019-11-15 (×3): qty 1

## 2019-11-15 MED ORDER — MIDAZOLAM HCL 2 MG/2ML IJ SOLN
1.0000 mg | INTRAMUSCULAR | Status: DC | PRN
  Administered 2019-11-15 – 2019-11-17 (×4): 1 mg via INTRAVENOUS
  Filled 2019-11-15 (×4): qty 2

## 2019-11-15 MED ORDER — LEVOTHYROXINE SODIUM 75 MCG PO TABS
75.0000 ug | ORAL_TABLET | Freq: Every day | ORAL | Status: DC
  Administered 2019-11-16 – 2019-11-17 (×2): 75 ug
  Filled 2019-11-15 (×2): qty 1

## 2019-11-15 MED ORDER — GLYCOPYRROLATE 0.2 MG/ML IJ SOLN
0.4000 mg | Freq: Three times a day (TID) | INTRAMUSCULAR | Status: DC
  Administered 2019-11-15 – 2019-11-17 (×8): 0.4 mg via INTRAVENOUS
  Filled 2019-11-15 (×8): qty 2

## 2019-11-15 NOTE — Plan of Care (Signed)
°  Problem: Education: Goal: Knowledge of General Education information will improve Description: Including pain rating scale, medication(s)/side effects and non-pharmacologic comfort measures Outcome: Not Applicable   Problem: Health Behavior/Discharge Planning: Goal: Ability to manage health-related needs will improve Outcome: Not Applicable   Problem: Clinical Measurements: Goal: Ability to maintain clinical measurements within normal limits will improve Outcome: Progressing Goal: Will remain free from infection Outcome: Progressing Goal: Diagnostic test results will improve Outcome: Progressing Goal: Respiratory complications will improve Outcome: Not Progressing Trach to Vent, Unable to wean at this time  Goal: Cardiovascular complication will be avoided Outcome: Progressing   Problem: Activity: Goal: Risk for activity intolerance will decrease Outcome: Not Progressing Bedrest, with decreased AROM to both upper and lower extremities   Problem: Nutrition: Goal: Adequate nutrition will be maintained Outcome: Progressing   Problem: Elimination: Goal: Will not experience complications related to bowel motility Outcome: Not Progressing Liquid stools continue Goal: Will not experience complications related to urinary retention Outcome: Progressing   Problem: Pain Managment: Goal: General experience of comfort will improve Outcome: Progressing   Problem: Safety: Goal: Ability to remain free from injury will improve Outcome: Progressing   Problem: Skin Integrity: Goal: Risk for impaired skin integrity will decrease Outcome: Not Progressing Poor healing progress to Stage 2 on coccyx

## 2019-11-15 NOTE — Progress Notes (Signed)
[place] for lovenox Indication: atrial fibrillation  Allergies  Allergen Reactions  . Honey Bee Venom Protein [Bee Venom] Anaphylaxis  . Ether Nausea And Vomiting  . Nickel Other (See Comments)    infection  . Other Other (See Comments)    Pt reports that she cannot have staples placed due to infection.  . Tape Rash    Patient states that she has an allergic reaction to certain tapes. She states that she can tolerate paper tape.    Patient Measurements: Height: 5\' 2"  (157.5 cm) Weight: 76.5 kg (168 lb 10.4 oz) IBW/kg (Calculated) : 50.1  Vital Signs: Temp: 98.3 F (36.8 C) (11/09 0804) Temp Source: Oral (11/09 0804) BP: 131/44 (11/09 0747) Pulse Rate: 75 (11/09 0700)  Labs: Recent Labs    11/13/19 0826 11/14/19 0243 11/14/19 1124 11/15/19 0317  HGB  --  6.9* 8.0*  --   HCT  --  24.8* 27.7*  --   PLT  --  223  --   --   CREATININE 0.58 0.65  --  0.55    Estimated Creatinine Clearance: 62.7 mL/min (by C-G formula based on SCr of 0.55 mg/dL).   Medical History: Past Medical History:  Diagnosis Date  . Asthma   . Cancer ([place])    esophageal cancer  . COPD (chronic obstructive pulmonary disease) ([place])   . Coronary artery disease   . Depression   . Diabetes mellitus without complication ([place])    Type II  . Diastolic dysfunction   . Diverticulosis   . Fatty liver   . GERD without esophagitis   . Hx of Clostridium difficile infection   . Hx of colonic polyps   . Hx of small bowel obstruction   . Hypertension, benign   . Hypothyroidism   . Irritable bowel syndrome   . Left bundle branch block   . Lumbar herniated disc   . Mixed hyperlipidemia   . Osteoarthritis   . PAF (paroxysmal atrial fibrillation) ([place])    06/2019  . Pneumonia   . Sleep apnea    Uses O2 at bedtime on occasion    Assessment: 69 yo F s/p esophagectomy with history of atrial fibrillation. Was on Xarelto PTA. Patient was receiving Eliquis since  Xarelto cannot go down J tube (last Eliquis dose 10/24 ~2200). She is now on TPN and pharmacy dosing lovenox. Plans noted for palliative care consult -Hg= 8.0, SCr stable   Goal of Therapy:  Anti-Xa (LMWH) peak level 0.6-1.0 units/mL Monitor platelets by anticoagulation protocol: Yes   Plan:   Continue Enoxaparin 60 mg Q12h Monitor CBC every 3 days  Hildred Laser, PharmD Clinical Pharmacist **Pharmacist phone directory can now be found on [email or web address] ([email or web address]).  Listed under [place].

## 2019-11-15 NOTE — Progress Notes (Signed)
Patient ID: [name], female   DOB: 1950-10-06, 69 y.o.   MRN: [id number] TCTS Evening Rounds:  Hemodynamically stable Remains sedated on vent. Urine output ok TNA for nutrition. Made DNR today.

## 2019-11-15 NOTE — Progress Notes (Signed)
[address]       [place] [address]             [phone]                 32 Days Post-Op Procedure(s) (LRB): XI ROBOTIC ASSISTED [place] ESOPHAGECTOMY USING NIMS (N/A) VIDEO BRONCHOSCOPY (N/A) JEJUNOSTOMY PLACEMENT (N/A) INTERCOSTAL NERVE BLOCK (Right)   Events: Off pressors _______________________________________________________________ Vitals: BP (!) 131/44   Pulse 75   Temp 98.6 F (37 C) (Axillary)   Resp (!) 24   Ht 5\' 2"  (1.575 m)   Wt 76.5 kg   SpO2 94%   BMI 30.85 kg/m   - Neuro: sedated. arousable  - Cardiovascular: Sinus  Drips:     - Pulm:  Vent Mode: PRVC FiO2 (%):  [50 %] 50 % Set Rate:  [26 bmp] 26 bmp Vt Set:  [300 mL] 300 mL PEEP:  [8 cmH20] 8 cmH20 Plateau Pressure:  [21 MGQ67-61 cmH20] 28 cmH20  ABG    Component Value Date/Time   PHART 7.372 10/26/2019 0749   PCO2ART 54.4 (H) 10/26/2019 0749   PO2ART 105 10/26/2019 0749   HCO3 31.4 (H) 10/26/2019 0749   TCO2 33 (H) 10/26/2019 0749   ACIDBASEDEF 7.8 (H) 10/26/2019 2050   O2SAT 98.0 10/26/2019 0749    - Abd: soft.  J tube in place  - Extremity: warm  .Intake/Output      11/08 0701 - 11/09 0700 11/09 0701 - 11/10 0700   I.V. (mL/kg) 2494.5 (32.6)    Blood 315    Other     NG/GT 168.3    IV Piggyback 210.8    Total Intake(mL/kg) 3188.7 (41.7)    Urine (mL/kg/hr) 1720 (0.9)    Drains 0    Stool 250    Chest Tube 0    Total Output 1970    Net +1218.7            _______________________________________________________________ Labs: CBC Latest Ref Rng & Units 11/14/2019 11/14/2019 11/12/2019  WBC 4.0 - 10.5 K/uL - 8.9 8.1  Hemoglobin 12.0 - 15.0 g/dL 8.0(L) 6.9(LL) 7.1(L)  Hematocrit 36 - 46 % 27.7(L) 24.8(L) 24.3(L)  Platelets 150 - 400 K/uL - 223 212   CMP Latest Ref Rng & Units 11/15/2019 11/14/2019 11/13/2019  Glucose 70 - 99 mg/dL 164(H) 188(H) 262(H)  BUN 8 - 23 mg/dL 43(H) 44(H) 37(H)  Creatinine 0.44 - 1.00 mg/dL 0.55 0.65 0.58  Sodium 135  - 145 mmol/L 145 146(H) 143  Potassium 3.5 - 5.1 mmol/L 4.4 4.9 5.5(H)  Chloride 98 - 111 mmol/L 108 108 103  CO2 22 - 32 mmol/L 32 33(H) 33(H)  Calcium 8.9 - 10.3 mg/dL 8.0(L) 8.2(L) 8.0(L)  Total Protein 6.5 - 8.1 g/dL - 5.7(L) -  Total Bilirubin 0.3 - 1.2 mg/dL - 0.2(L) -  Alkaline Phos 38 - 126 U/L - 50 -  AST 15 - 41 U/L - 15 -  ALT 0 - 44 U/L - 15 -      _______________________________________________________________  Assessment and Plan: POD 32 s/p Robotic 3 field esophagectomy.  Respiratory failure, cervical anastamotic leak.  Tracheal mucosal ulceration with fistula  Neuro: pain controlled CV: sinus, continue amio gtt for now. Off pressors Pulm: will plan to exchange trach for longer device to cover the fistula Renal: Creatinine stable GI: will start TPN.  Severely malnourished with an albumin on 1.3.  Pt is not tolerating the tubefeeds given hx of small bowel  resection Heme: receiving 1 unit of blood ID: wet to dry dressing to cervical incision.  Antibiotics restarted.   Endo: SSI  Dispo: I had a long discussion with the patients daughter, and boyfriend in which I explained the gravity of her clinical status.  I further explained that she likely would not survive this hospitalization given her poor wound healing, and nutrition.  The family did not want to make her DNR, and still want everything done.  I will consult palliative care today.  Melodie Bouillon, MD

## 2019-11-15 NOTE — Progress Notes (Signed)
Palliative:  HPI: 69 y.o.femalewith multiple medical problems including stage II squamous cell esophageal cancer (diagnosed 04/2019)status post neoadjuvant chemoradiation completed in July, status postPEG (05/27/19), history of tobacco abuse, CAD, diastolic dysfunction, PAF, diabetes, COPD with nocturnal O2, and OSA. Patient was admitted to the hospital on 10/08/2019 for elective esophagectomy and J-tube placement. Unfortunately, she subsequently developed hypoxic respiratory failure, PTX,and an anastomotic leak ultimately requiring a chest tube. On 10/14 (postop day 5) she was transferred to the ICU for BiPAP and was subsequently intubated on 10/15. Patient was extubated on 10/18 but required reintubation same day. Unfortunately, her hospitalization has been further complicated sepsis, possible aspiration, development of an R. sided empyema, J tube leak, ileus, sacral wound, and tracheal fistula. She had tracheostomy placed on11/2. Patient has been unable to wean from the ventilator. Palliative care was consulted help address goals.  I met today at [name]'s bedside along with her significant other, [name], and daughter, [name]. [name] is more alert and interactive today. She responds well to [name] and even able to nod head yes/no at times although this is inconsistent. [name] shares that [name]'s brother went through similar events and he feels that she would not want to live prolonged on ventilator support. We were able to discuss limitations to improvement and concern for level of suffering. During their visit today it does appear that she wants to talk to them and tell them something but she is unable to do so. We discussed the confusion and anxiety that she could be feeling but unable to express. [name] does nod her head yes once when asked if she wanted to get rid of the breathing machine.   [name] has decided to put in place DNR. She does not want her mother to suffer or go  through more than she already has been through. We decided to continue current level of care, liberalize comfort medications to minimize suffering, and plans for family to gather at bedside Saturday for one way extubation. [name] and [name] both agree that this is best course of action as they do not want to see her continue to suffer.   All questions/concerns addressed. Emotional support provided. Updated Dr. Kipp Brood and Dr. Lynetta Mare.  Exam: More alert today. Able to track more. Left arm raised and reaching (even when nobody at bedside) and she does reach for family when on right side. Warm to touch.   Plan: - DNR decided.  - Continue current level of care with more focus on comfort and minimizing suffering.  - Plans for family to gather Saturday for one way extubation to comfort.   [id number] 75 min  Vinie Sill, NP Palliative Medicine Team Pager [phone] (Please see [email or web address] for schedule) Team Phone [phone]    Greater than 50%  of this time was spent counseling and coordinating care related to the above assessment and plan

## 2019-11-15 NOTE — Progress Notes (Signed)
PHARMACY - TOTAL PARENTERAL NUTRITION CONSULT NOTE  Indication: Intolerance to EN / Prolonged ileus  Patient Measurements: Height: 5\' 2"  (157.5 cm) Weight: 76.5 kg (168 lb 10.4 oz) IBW/kg (Calculated) : 50.1 TPN AdjBW (KG): 56.8 Body mass index is 30.85 kg/m.  Weight = 73 kg on admit  Assessment:  1 YOF presented on 10/23/2019 for bronch, esophagectomy and J-tube placement due to esophageal cancer.  Patient was started on EN post-op on 10/15/19 and tolerated goal rate.  She also passed her swallow evaluation and started on a CLD with nocturnal TF.  She unfortunately developed right PTX and subsequently require intubation, transfer to the ICU, and chest tube placement.  TF has been interrupted frequently since 10/24/19 due to drainage around J-tube and suspected ileus vs pSBO.  Pharmacy consulted for TPN management.  11/4 Patient accidentally pulled out NGT; tube feeds leaking around Jtube sight - rate decreased to 20 ml/hr and stool appearance may represent malabsorption. Pharmacy reconsulted to restart TPN   Glucose / Insulin: no hx DM, A1c 5.8% - CBGs/24h: < 180s Required 10 units SSI, Levemir 10 units BID (previously had insulin in TPN will hold off adding for now given low requirements of SSI) Electrolytes: Na 145, K 4.4 (goal >/= 4 for ileus) CL up to 108, CO2 down to 32 - on max chloride: Phos 3.4 and Mag 2.2  (goal >/= 2 for ileus) Renal: SCr 0.55 LFTs / TGs: LFTs / tbili WNL, TG 128 - elevated on 11/6 lipids removed from TPN, lipids resumed 11/7 at ~15% of TPN. Propofol stopped. Prealbumin / albumin: Prealbumin 6.3 (11/8) <<12.5 (11/6) << 16 (11/1), alb 1.3 Intake / Output; MIVF: UOP 0.9 ml/kg/hr, CT O/P 50mL, Jtube O/P 40mL, Stool/24h: 250 cc. NGT pulled out GI Imaging:  10/24 CT abd: ?focal enteritis, postoperative ileus Surgeries / Procedures: none since TPN  Central access: PICC placed 10/21/19 TPN start date: 10/30/19  Nutritional Goals (per RD rec on 11/4): 1800-2000 kCal,  100-120gm protein, >/= 1.8L fluid per day  Current Nutrition:  TPN at 80 cc/hr (no lipids) provides ~1800 kcal and ~119g protein meeting 100% of estimated needs  Per discussion with RD on 11/5 - will plan to meet 100% of nutritional needs with TPN due to leaking around J-tube as concern for malabsorption/maldigestion expressed per the RD  Plan:  TPN at 80 cc/hr will provide 1973 kcal and 119g protein meeting 100% of estimated needs (increase rate slightly to have the volume to take TPN) Increase SMOFlipids back to goal Electrolytes in TPN:no Na,K reduced 11/7 to 22 mEq/L KCl, Ca 46mEq/L, Mag 21mEq/L,Phos reduced 11/7 to Phos to 15 mmol/L (to limit K), cont max chloride - no changes today Add standard MVI and trace elements to TPN Continue CVTS SSI Q4H Continue Levemir 10 units SQ BID - will not add insulin to TPN due to low requirement of SSI F/U AM labs, volume status, CBGs  Barth Kirks, PharmD, BCPS, BCCCP Clinical Pharmacist [phone]  Please check AMION for all [place] numbers  11/15/2019 8:10 AM

## 2019-11-15 NOTE — Progress Notes (Addendum)
NAME:  [name], MRN:  [id number], DOB:  [birth year], LOS: 32 ADMISSION DATE:  11/04/2019, CONSULTATION DATE: 10/20/2019 REFERRING MD:  Dr. Kipp Brood, CHIEF COMPLAINT:  SOB   Brief History   [name] is a 69 y/o female with a PMHx of esophageal squamous cell carcinoma admitted on 10/08 for esophagectomy with interval development of respiratory distress requiring BiPAP and transfer to ICU.   History of present illness   [name] presented to [facility] for admission for elective esophagectomy on 10/08 that proceeded without any complications. She is currently POD 6.   Overnight, patient developed worsening work of breathing requiring NRB. She was noted to be diaphoretic with decreased lung sounds by the RN. Rapid response was called, who switched chest tube from water seal to suction. Air leak noted in [place]. CXR obtained overnight showed small right basilar pneumothorax with loculated pleural effusion, as well as interval development of extensive airspace infiltrate in the LUL. Repeat CXR this morning shows improvement in pneumothorax with no change in opacities. In the late morning, patient developed worsening respiratory distress requiring BiPAP placement and transfer to ICU for close monitoring.  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 Fib  Significant Hospital Events   10/08 >> Admitted to [facility] 10/14 >> Transferred to ICU for respiratory distress   Consults:  PCCM  Procedures:  10/08 > Esophagectomy 10/15 > ETT placed, Bronchoscopy     Significant Diagnostic Tests:  CXR 10/13 > Small right basilar pneumothorax, however, has increased in size since prior examination and there has now developed a small amount of laterally loculated pleural fluid. Extensive airspace infiltrate has now developed throughout the left upper lobe, likely infectious in the appropriate clinical setting.  CXR 10/14  > Right basilar pneumothorax is  smaller compared to earlier in the day. No tension component. Subcutaneous air present on the right. There is extensive airspace opacity throughout the left upper lobe and left perihilar regions, similar to earlier in the day. There is patchy atelectasis in the right mid lung and right base with suspected loculated pleural effusion on the right laterally ABD xray 10/20 > Mild diffuse air distension of the bowel, either reflecting ileus or low-grade obstruction.  Micro Data:  10/18 Pleural culture - moderate GNR> MODERATE ENTEROBACTER CLOACAE  MODERATE ENTEROBACTER AEROGENES  FEW PSEUDOMONAS AERUGINOSA  10/15 Respiratory culture - Pan-senstive PA Wound culture 10/18 >  10/21 blood> NG 10/30 resp> pseudomonas and enterobacter aerogenes 11/2> resp > rare pseudomonas aeruginosa, rare enterobacter aerogenes  Antimicrobials:  Zosyn 10/14 >> 10/21 Cefepime 10/21 >10/28, 10/29>11/2 Flagyl 10/21 >10/24 Fluconazole 10/22> 10/27 Meropenem 11/2 >>  Interim history/subjective:  No acute events.   Family still processing things, undecided on goals of care at this time.  Objective   Blood pressure (!) 131/44, pulse 75, temperature 98.6 F (37 C), temperature source Axillary, resp. rate (!) 24, height 5\' 2"  (1.575 m), weight 76.5 kg, SpO2 94 %.    Vent Mode: PRVC FiO2 (%):  [50 %] 50 % Set Rate:  [26 bmp] 26 bmp Vt Set:  [300 mL] 300 mL PEEP:  [8 cmH20] 8 cmH20 Plateau Pressure:  [21 cmH20-28 cmH20] 28 cmH20   Intake/Output Summary (Last 24 hours) at 11/15/2019 0803 Last data filed at 11/15/2019 0700 Gross per 24 hour  Intake 3087.01 ml  Output 1970 ml  Net 1117.01 ml    Filed Weights   11/13/19 0416 11/14/19 0517 11/15/19 0532  Weight: 72.8 kg 75.3 kg 76.5 kg   Physical Exam: General: Critically ill, elderly, frail, chronically ill appearing female HEENT: Edentulous. Significant temporal muscle wasting.  Ttrach secure Neuro: Opens eyes intermittently but not following commands. Does  move spontaneously  PULM: Symmetrical chest expansion. Scattered Rhonchi. Coarse throughout  GI: Soft, BS hypoactive. Extremities: Muscle wasting BUE BLE. LUE PICC  Skin: Pale skin. Dry. Sacral wound with dressing intact. Tunneling neck wound adjacent to tracheostomy. Scattered ecchymosis.    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  Push bronchial hygiene and re-attempt daily as able. - Continue TPN, tube feeds discontinued 11/8 - Continue PRBC transfusion, goal Hgb > 7 - Continue IV amiodarone for now - Continue goals of care conversations with family, PMT has been engaged.  Fully support DNR and would advocate for transition to comfort care   Montey Hora, [place] [facility] 11/15/2019, 8:03 AM

## 2019-11-15 NOTE — Progress Notes (Signed)
Physical Therapy Treatment Patient Details Name: [name] MRN: [id number] DOB: [birth year] Today's Date: 11/15/2019    History of Present Illness 69 yo with history of esophageal CA admitted for esophagectomy with jejunostomy placement Pt with respiratory distress on 10/14, transferred to ICU and intubated on 10/15 with one failed attempt to extubate. Pt with anastamotic leak, NGT placed 11/02/19. Pt received trach on 11/09/2019.  Additional PMHx: CoPD, Asthma, HTN, HLD, hypothyroidism, CAD    PT Comments    Pt admitted with above diagnosis; pt demonstrating ability to track with eyes at beginning of session and demonstrate purposeful movement with B UEs (L>R); pt continues to have difficulty with purposeful movement with BLEs; performed gentle jaw ROM activities to attempt to close mouth with muscle activation noted but unable to close; RN stating they are going to start weaning meds in hopes to increase participation in therapy; pt will benefit from skilled PT to address deficits in strength, coordination, bed mobility, endurance and safety to maximize independence with functional mobility prior to discharge.     Follow Up Recommendations  CIR;Supervision/Assistance - 24 hour     Equipment Recommendations  Other (comment) (TBD)    Recommendations for Other Services       Precautions / Restrictions      Mobility  Bed Mobility                  Transfers                    Ambulation/Gait                 Stairs             Wheelchair Mobility    Modified Rankin (Stroke Patients Only)       Balance                                            Cognition                                              Exercises General Exercises - Upper Extremity Shoulder Flexion: Both;AAROM;10 reps;Supine Elbow Flexion: Both;AAROM;Supine;10 reps General Exercises - Lower Extremity Heel Slides: PROM;Both;5 reps;Supine  (pt again able to initiate movement with LLE but unable to recreate or perform wtih RLE)    General Comments        Pertinent Vitals/Pain      Home Living                      Prior Function            PT Goals (current goals can now be found in the care plan section) Acute Rehab PT Goals Patient Stated Goal: return home PT Goal Formulation: With patient Time For Goal Achievement: 11/23/19 Potential to Achieve Goals: Fair Progress towards PT goals: Not progressing toward goals - comment (pt limited in session due to medication sedation)    Frequency    Min 3X/week      PT Plan Current plan remains appropriate    Co-evaluation              AM-PAC PT "6 Clicks" Mobility   Outcome Measure  Help needed turning from your  back to your side while in a flat bed without using bedrails?: Total Help needed moving from lying on your back to sitting on the side of a flat bed without using bedrails?: A Lot Help needed moving to and from a bed to a chair (including a wheelchair)?: Total Help needed standing up from a chair using your arms (e.g., wheelchair or bedside chair)?: Total Help needed to walk in hospital room?: Total Help needed climbing 3-5 steps with a railing? : Total 6 Click Score: 7    End of Session Equipment Utilized During Treatment: Oxygen Activity Tolerance: Patient limited by fatigue Patient left: in bed;with call bell/phone within reach;with SCD's reapplied Nurse Communication: Mobility status PT Visit Diagnosis: Other abnormalities of gait and mobility (R26.89);Difficulty in walking, not elsewhere classified (R26.2);Muscle weakness (generalized) (M62.81)     Time: 1594-7076 PT Time Calculation (min) (ACUTE ONLY): 13 min  Charges:  $Therapeutic Exercise: 8-22 mins                     Lyanne Co, DPT Acute Rehabilitation Services [phone]   [place] 11/15/2019, 1:22 PM

## 2019-11-16 DIAGNOSIS — Z9911 Dependence on respirator [ventilator] status: Secondary | ICD-10-CM | POA: Diagnosis not present

## 2019-11-16 DIAGNOSIS — Z515 Encounter for palliative care: Secondary | ICD-10-CM | POA: Diagnosis not present

## 2019-11-16 DIAGNOSIS — Z7189 Other specified counseling: Secondary | ICD-10-CM | POA: Diagnosis not present

## 2019-11-16 LAB — GLUCOSE, CAPILLARY
Glucose-Capillary: 112 mg/dL — ABNORMAL HIGH (ref 70–99)
Glucose-Capillary: 113 mg/dL — ABNORMAL HIGH (ref 70–99)
Glucose-Capillary: 123 mg/dL — ABNORMAL HIGH (ref 70–99)
Glucose-Capillary: 127 mg/dL — ABNORMAL HIGH (ref 70–99)
Glucose-Capillary: 131 mg/dL — ABNORMAL HIGH (ref 70–99)
Glucose-Capillary: 67 mg/dL — ABNORMAL LOW (ref 70–99)
Glucose-Capillary: 98 mg/dL (ref 70–99)

## 2019-11-16 LAB — CBC
HCT: 24.2 % — ABNORMAL LOW (ref 36.0–46.0)
Hemoglobin: 7 g/dL — ABNORMAL LOW (ref 12.0–15.0)
MCH: 29.4 pg (ref 26.0–34.0)
MCHC: 28.9 g/dL — ABNORMAL LOW (ref 30.0–36.0)
MCV: 101.7 fL — ABNORMAL HIGH (ref 80.0–100.0)
Platelets: 194 10*3/uL (ref 150–400)
RBC: 2.38 MIL/uL — ABNORMAL LOW (ref 3.87–5.11)
RDW: 15.4 % (ref 11.5–15.5)
WBC: 8.1 10*3/uL (ref 4.0–10.5)
nRBC: 0 % (ref 0.0–0.2)

## 2019-11-16 LAB — TRIGLYCERIDES: Triglycerides: 125 mg/dL (ref <150)

## 2019-11-16 MED ORDER — TRAVASOL 10 % IV SOLN
INTRAVENOUS | Status: DC
  Filled 2019-11-16: qty 1190.4

## 2019-11-16 MED ORDER — DEXTROSE 50 % IV SOLN
INTRAVENOUS | Status: AC
  Administered 2019-11-16: 25 mL
  Filled 2019-11-16: qty 50

## 2019-11-16 MED ORDER — DEXTROSE 50 % IV SOLN: 12.5000 g | INTRAVENOUS | Status: AC

## 2019-11-16 MED ORDER — MORPHINE 100MG IN NS 100ML (1MG/ML) PREMIX INFUSION
1.0000 mg/h | INTRAVENOUS | Status: DC
  Administered 2019-11-16: 2 mg/h via INTRAVENOUS
  Administered 2019-11-17: 5 mg/h via INTRAVENOUS
  Filled 2019-11-16 (×2): qty 100

## 2019-11-16 MED ORDER — MORPHINE BOLUS VIA INFUSION
2.0000 mg | INTRAVENOUS | Status: DC | PRN
  Administered 2019-11-16: 2 mg via INTRAVENOUS
  Filled 2019-11-16: qty 2

## 2019-11-16 MED ORDER — PANTOPRAZOLE SODIUM 40 MG PO PACK
40.0000 mg | PACK | Freq: Two times a day (BID) | ORAL | Status: DC
  Administered 2019-11-16 – 2019-11-17 (×3): 40 mg
  Filled 2019-11-16 (×3): qty 20

## 2019-11-16 NOTE — Progress Notes (Addendum)
NAME:  [name], MRN:  [id number], DOB:  1950-10-24, LOS: 17 ADMISSION DATE:  11/03/2019, CONSULTATION DATE: 10/20/2019 REFERRING MD:  Dr. Kipp Brood, CHIEF COMPLAINT:  SOB   Brief History   [name] is a 69 y/o female with a PMHx of esophageal squamous cell carcinoma admitted on 10/08 for esophagectomy with interval development of respiratory distress requiring BiPAP and transfer to ICU.   History of present illness   [name] presented to [facility] for admission for elective esophagectomy on 10/08 that proceeded without any complications. She is currently POD 6.   Overnight, patient developed worsening work of breathing requiring NRB. She was noted to be diaphoretic with decreased lung sounds by the RN. Rapid response was called, who switched chest tube from water seal to suction. Air leak noted in [place]. CXR obtained overnight showed small right basilar pneumothorax with loculated pleural effusion, as well as interval development of extensive airspace infiltrate in the LUL. Repeat CXR this morning shows improvement in pneumothorax with no change in opacities. In the late morning, patient developed worsening respiratory distress requiring BiPAP placement and transfer to ICU for close monitoring.  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 Fib  Significant Hospital Events   10/08 >> Admitted to [facility] 10/14 >> Transferred to ICU for respiratory distress  11/9 > PMT met with pt's significant other and daughter > DNR placed with plans for 1 way extubation on Saturday 11/13 after family has a chance to see and visit with pt  Consults:  PCCM  Procedures:  10/08 > Esophagectomy 10/15 > ETT placed, Bronchoscopy     Significant Diagnostic Tests:  CXR 10/13 > Small right basilar pneumothorax, however, has increased in size since prior examination and there has now developed a small amount of laterally loculated pleural fluid. Extensive  airspace infiltrate has now developed throughout the left upper lobe, likely infectious in the appropriate clinical setting.  CXR 10/14  > Right basilar pneumothorax is smaller compared to earlier in the day. No tension component. Subcutaneous air present on the right. There is extensive airspace opacity throughout the left upper lobe and left perihilar regions, similar to earlier in the day. There is patchy atelectasis in the right mid lung and right base with suspected loculated pleural effusion on the right laterally ABD xray 10/20 > Mild diffuse air distension of the bowel, either reflecting ileus or low-grade obstruction.  Micro Data:  10/18 Pleural culture - moderate GNR> MODERATE ENTEROBACTER CLOACAE  MODERATE ENTEROBACTER AEROGENES  FEW PSEUDOMONAS AERUGINOSA  10/15 Respiratory culture - Pan-senstive PA Wound culture 10/18 >  10/21 blood> NG 10/30 resp> pseudomonas and enterobacter aerogenes 11/2> resp > rare pseudomonas aeruginosa, rare enterobacter aerogenes  Antimicrobials:  Zosyn 10/14 >> 10/21 Cefepime 10/21 >10/28, 10/29>11/2 Flagyl 10/21 >10/24 Fluconazole 10/22> 10/27 Meropenem 11/2 >>  Interim history/subjective:  PMT met with pt's significant other and daughter > DNR placed with plans for 1 way extubation on Saturday 11/13 after family has a chance to see and visit with pt.  Objective   Blood pressure (!) 118/45, pulse 93, temperature 99 F (37.2 C), temperature source Axillary, resp. rate (!) 28, height _0  (1.575 m), weight 77.4 kg, SpO2 92 %.    Vent Mode: PRVC FiO2 (%):  [50 %-60 %] 60 % Set Rate:  [26 bmp] 26 bmp Vt Set:  [300 mL] 300 mL PEEP:  [8 cmH20] 8 cmH20 Pressure Support:  [9 cmH20] 9  cmH20 Plateau Pressure:  [23 cmH20-26 cmH20] 23 cmH20   Intake/Output Summary (Last 24 hours) at 11/16/2019 0830 Last data filed at 11/16/2019 0800 Gross per 24 hour  Intake 2437.14 ml  Output 2140 ml  Net 297.14 ml    Filed Weights   11/14/19 0517 11/15/19  0532 11/16/19 0500  Weight: 75.3 kg 76.5 kg 77.4 kg   Physical Exam: General: Elderly, frail, chronically ill appearing female HEENT: Edentulous. Significant temporal muscle wasting.  Trach secure Neuro: Opens eyes intermittently but not following any commands. Does move spontaneously and somewhat nods head but not sure that this is purposeful PULM: Symmetrical chest expansion. Scattered Rhonchi. Coarse throughout  GI: Soft, BS hypoactive. Extremities: Muscle wasting BUE BLE. LUE PICC  Skin: Pale skin. Dry. Sacral wound with dressing intact. Tunneling neck wound adjacent to tracheostomy. Scattered ecchymosis.    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 - DNR with plans for 1 way extubation Saturday 11/3 after family has a chance to visit with pt. - Continue TPN until bag has completed (do not restart once complete), amio, supportive care in the meantime. - No further labs / imaging. - Rest per TCTS.  Nothing further to add.   Montey Hora, [place] [facility] 11/16/2019, 8:30 AM

## 2019-11-16 NOTE — Plan of Care (Signed)
  Problem: Clinical Measurements: Goal: Ability to maintain clinical measurements within normal limits will improve Outcome: Not Progressing Unable to wean at present, SR with BBB.  Goal: Will remain free from infection Outcome: Progressing Goal: Diagnostic test results will improve Outcome: Progressing Goal: Respiratory complications will improve Outcome: Not Progressing Unable to wean, deSat's and becomes tachypneic, secretions have decreased Goal: Cardiovascular complication will be avoided Outcome: Progressing   Problem: Activity: Goal: Risk for activity intolerance will decrease Outcome: Not Progressing Profoundly weak and on bedrest   Problem: Nutrition: Goal: Adequate nutrition will be maintained Outcome: Progressing      On TPN Problem: Elimination: Goal: Will not experience complications related to bowel motility Outcome: Not Progressing Loose stools continue   Problem: Pain Managment: Goal: General experience of comfort will improve Outcome: Progressing   Problem: Safety: Goal: Ability to remain free from injury will improve Outcome: Progressing   Problem: Skin Integrity: Goal: Risk for impaired skin integrity will decrease Outcome: Not Progressing Slow healing to Pressure ulcer on sacrum

## 2019-11-16 NOTE — Progress Notes (Signed)
PHARMACY - TOTAL PARENTERAL NUTRITION CONSULT NOTE  Indication: Intolerance to EN / Prolonged ileus  Patient Measurements: Height: 5\' 2"  (157.5 cm) Weight: 77.4 kg (170 lb 10.2 oz) IBW/kg (Calculated) : 50.1 TPN AdjBW (KG): 56.8 Body mass index is 31.21 kg/m.  Weight = 73 kg on admit  Assessment:  59 YOF presented on 10/17/2019 for bronch, esophagectomy and J-tube placement due to esophageal cancer.  Patient was started on EN post-op on 10/15/19 and tolerated goal rate.  She also passed her swallow evaluation and started on a CLD with nocturnal TF.  She unfortunately developed right PTX and subsequently require intubation, transfer to the ICU, and chest tube placement.  TF has been interrupted frequently since 10/24/19 due to drainage around J-tube and suspected ileus vs pSBO.  Pharmacy consulted for TPN management.  11/4 Patient accidentally pulled out NGT; tube feeds leaking around Jtube sight - rate decreased to 20 ml/hr and stool appearance may represent malabsorption. Pharmacy reconsulted to restart TPN   Glucose / Insulin: no hx DM, A1c 5.8% - CBGs/24h: < 180s Required 7 units SSI, Levemir 10 units BID (previously had insulin in TPN will hold off adding for now given low requirements of SSI) Electrolytes: Na 145, K 4.4 (goal >/= 4 for ileus) CL up to 108, CO2 down to 32 - on max chloride: Phos 3.4 and Mag 2.2  (goal >/= 2 for ileus) - no new labs today Renal: SCr 0.55 LFTs / TGs: LFTs / tbili WNL, TG 128 - elevated on 11/6 lipids removed from TPN, lipids resumed 11/7 at ~15% of TPN. Propofol stopped. Prealbumin / albumin: Prealbumin 6.3 (11/8) <<12.5 (11/6) << 16 (11/1), alb 1.3 Intake / Output; MIVF: UOP 0.9 ml/kg/hr, CT O/P 148mL, Jtube O/P 28mL, Stool/24h: 50 cc. NGT pulled out GI Imaging:  10/24 CT abd: ?focal enteritis, postoperative ileus Surgeries / Procedures: none since TPN  Central access: PICC placed 10/21/19 TPN start date: 10/30/19  Nutritional Goals (per RD rec on  11/4): 1800-2000 kCal, 100-120gm protein, >/= 1.8L fluid per day  Current Nutrition:  TPN at 80 cc/hr (no lipids) provides ~1800 kcal and ~119g protein meeting 100% of estimated needs  Per discussion with RD on 11/5 - will plan to meet 100% of nutritional needs with TPN due to leaking around J-tube as concern for malabsorption/maldigestion expressed per the RD  Plan:  TPN at 80 cc/hr will provide 1973 kcal and 119g protein meeting 100% of estimated needs (increase rate slightly to have the volume to take TPN) Increase SMOFlipids back to goal Electrolytes in TPN:no Na,K reduced 11/7 to 22 mEq/L KCl, Ca 23mEq/L, Mag 39mEq/L,Phos reduced 11/7 to Phos to 15 mmol/L (to limit K), cont max chloride - no changes today Add standard MVI and trace elements to TPN Continue CVTS SSI Q4H Continue Levemir 10 units SQ BID - will not add insulin to TPN due to low requirement of SSI F/U AM labs, volume status, CBGs  Barth Kirks, PharmD, BCPS, BCCCP Clinical Pharmacist [phone]  Please check AMION for all [place] numbers  11/16/2019 8:11 AM

## 2019-11-16 NOTE — Progress Notes (Signed)
[address]       [place] [address]             [phone]                 33 Days Post-Op Procedure(s) (LRB): XI ROBOTIC ASSISTED [place]KEOWN ESOPHAGECTOMY USING NIMS (N/A) VIDEO BRONCHOSCOPY (N/A) JEJUNOSTOMY PLACEMENT (N/A) INTERCOSTAL NERVE BLOCK (Right)   Events: Off pressors _______________________________________________________________ Vitals: BP (!) 118/54   Pulse 94   Temp 99 F (37.2 C) (Axillary)   Resp 14   Ht 5\' 2"  (1.575 m)   Wt 77.4 kg   SpO2 94%   BMI 31.21 kg/m   - Neuro: sedated. arousable  - Cardiovascular: Sinus  Drips:     - Pulm:  Vent Mode: PRVC FiO2 (%):  [50 %-60 %] 60 % Set Rate:  [26 bmp] 26 bmp Vt Set:  [300 mL] 300 mL PEEP:  [8 cmH20] 8 cmH20 Pressure Support:  [9 cmH20] 9 cmH20 Plateau Pressure:  [21 cmH20-30 cmH20] 21 cmH20  ABG    Component Value Date/Time   PHART 7.372 10/26/2019 0749   PCO2ART 54.4 (H) 10/26/2019 0749   PO2ART 105 10/26/2019 0749   HCO3 31.4 (H) 10/26/2019 0749   TCO2 33 (H) 10/26/2019 0749   ACIDBASEDEF 7.8 (H) 10/31/2019 2050   O2SAT 98.0 10/26/2019 0749    - Abd: soft.  J tube in place  - Extremity: warm  .Intake/Output      11/09 0701 - 11/10 0700 11/10 0701 - 11/11 0700   I.V. (mL/kg) 2183.8 (28.2) 400.1 (5.2)   Blood     Other 175    NG/GT 90    IV Piggyback     Total Intake(mL/kg) 2448.8 (31.6) 400.1 (5.2)   Urine (mL/kg/hr) 1740 (0.9) 470 (1)   Drains 0 0   Stool 75 0   Chest Tube 150 20   Total Output 1965 490   Net +483.8 -89.9           _______________________________________________________________ Labs: CBC Latest Ref Rng & Units 11/16/2019 11/14/2019 11/14/2019  WBC 4.0 - 10.5 K/uL 8.1 - 8.9  Hemoglobin 12.0 - 15.0 g/dL 7.0(L) 8.0(L) 6.9(LL)  Hematocrit 36 - 46 % 24.2(L) 27.7(L) 24.8(L)  Platelets 150 - 400 K/uL 194 - 223   CMP Latest Ref Rng & Units 11/15/2019 11/14/2019 11/13/2019  Glucose 70 - 99 mg/dL 164(H) 188(H) 262(H)  BUN 8 - 23 mg/dL  43(H) 44(H) 37(H)  Creatinine 0.44 - 1.00 mg/dL 0.55 0.65 0.58  Sodium 135 - 145 mmol/L 145 146(H) 143  Potassium 3.5 - 5.1 mmol/L 4.4 4.9 5.5(H)  Chloride 98 - 111 mmol/L 108 108 103  CO2 22 - 32 mmol/L 32 33(H) 33(H)  Calcium 8.9 - 10.3 mg/dL 8.0(L) 8.2(L) 8.0(L)  Total Protein 6.5 - 8.1 g/dL - 5.7(L) -  Total Bilirubin 0.3 - 1.2 mg/dL - 0.2(L) -  Alkaline Phos 38 - 126 U/L - 50 -  AST 15 - 41 U/L - 15 -  ALT 0 - 44 U/L - 15 -      _______________________________________________________________  Assessment and Plan: POD 32 s/p Robotic 3 field esophagectomy.  Respiratory failure, cervical anastamotic leak.  Tracheal mucosal ulceration with fistula  Neuro: pain controlled CV: sinus, continue amio gtt for now. Off pressors Pulm: continue vent support Renal: Creatinine stable GI: will start TPN.  Severely malnourished with an albumin on 1.3.  Pt is not tolerating the tubefeeds given hx of small  bowel resection Heme: receiving 1 unit of blood ID: wet to dry dressing to cervical incision.  Antibiotics restarted.   Endo: SSI  Dispo: Pt is now DNR.  Will transition to comfort care likely on Saturday. Melodie Bouillon, MD

## 2019-11-16 NOTE — Progress Notes (Signed)
Palliative:  HPI:69 y.o.femalewith multiple medical problems including stage II squamous cell esophageal cancer (diagnosed 04/2019)status post neoadjuvant chemoradiation completed in July, status postPEG (05/27/19), history of tobacco abuse, CAD, diastolic dysfunction, PAF, diabetes, COPD with nocturnal O2, and OSA. Patient was admitted to the hospital on 10/27/2019 for elective esophagectomy and J-tube placement. Unfortunately, she subsequently developed hypoxic respiratory failure, PTX,and an anastomotic leak ultimately requiring a chest tube. On 10/14 (postop day 5) she was transferred to the ICU for BiPAP and was subsequently intubated on 10/15. Patient was extubated on 10/18 but required reintubation same day. Unfortunately, her hospitalization has been further complicated sepsis, possible aspiration, development of an R. sided empyema, J tube leak, ileus, sacral wound, and tracheal fistula. She had tracheostomy placed on11/2. Patient has been unable to wean from the ventilator. Palliative care was consulted help address goals.  I met today at [name]'s bedside. No family present. [name] is resting comfortably. Discussed plans with Dr. Lynetta Mare.   I called and spoke with [name]'s daughter, [name]. [name] and I discussed transition off TPN and avoiding feeding as this will only increase secretions. Will also limit blood sticks for comfort. [name] plans to be at bedside with her sister and support system for one way extubation to comfort. [name] has no further questions or concerns at this time. Emotional support provided.   Exam: Sleeping comfortably. No distress. Sedated on vent. Warm to touch.   Plan: - DNR - Plans for one way extubation to comfort Saturday.   15 min  Vinie Sill, NP Palliative Medicine Team Pager [phone] (Please see [email or web address] for schedule) Team Phone [phone]    Greater than 50%  of this time was spent counseling and coordinating  care related to the above assessment and plan

## 2019-11-16 NOTE — Progress Notes (Addendum)
Patient ID: [name], female   DOB: 1950/11/09, 69 y.o.   MRN: [id number] EVENING ROUNDS NOTE :     [address]       [place] [address]             [phone]                 33 Days Post-Op Procedure(s) (LRB): XI ROBOTIC ASSISTED MCKEOWN ESOPHAGECTOMY USING NIMS (N/A) VIDEO BRONCHOSCOPY (N/A) JEJUNOSTOMY PLACEMENT (N/A) INTERCOSTAL NERVE BLOCK (Right)  Total Length of Stay:  LOS: 33 days  BP (!) 100/42   Pulse 84   Temp 99 F (37.2 C) (Axillary)   Resp (!) 22   Ht 5\' 2"  (1.575 m)   Wt 77.4 kg   SpO2 94%   BMI 31.21 kg/m   .Intake/Output      11/09 0701 - 11/10 0700 11/10 0701 - 11/11 0700   I.V. (mL/kg) 2183.8 (28.2) 400.1 (5.2)   Blood     Other 175    NG/GT 90    IV Piggyback     Total Intake(mL/kg) 2448.8 (31.6) 400.1 (5.2)   Urine (mL/kg/hr) 1740 (0.9) 545 (0.8)   Drains 0 0   Stool 75 0   Chest Tube 150 20   Total Output 1965 565   Net +483.8 -164.9          . sodium chloride 10 mL/hr at 11/07/19 2044  . morphine 5 mg/hr (11/16/19 1300)     Lab Results  Component Value Date   WBC 8.1 11/16/2019   HGB 7.0 (L) 11/16/2019   HCT 24.2 (L) 11/16/2019   PLT 194 11/16/2019   GLUCOSE 164 (H) 11/15/2019   CHOL  01/20/2007    96        ATP III CLASSIFICATION:  <200     mg/dL   Desirable  200-239  mg/dL   Borderline High  >=240    mg/dL   High          TRIG 125 11/16/2019   HDL 28 (L) 01/20/2007   LDLCALC  01/20/2007    36        Total Cholesterol/HDL:CHD Risk Coronary Heart Disease Risk Table                     Men   Women  1/2 Average Risk   3.4   3.3  Average Risk       5.0   4.4  2 X Average Risk   9.6   7.1  3 X Average Risk  23.4   11.0        Use the calculated Patient Ratio above and the CHD Risk Table to determine the patient's CHD Risk.        ATP III CLASSIFICATION (LDL):  <100     mg/dL   Optimal  100-129  mg/dL   Near or Above                    Optimal  130-159  mg/dL   Borderline  160-189  mg/dL    High  >190     mg/dL   Very High   ALT 15 11/14/2019   AST 15 11/14/2019   NA 145 11/15/2019   K 4.4 11/15/2019   CL 108 11/15/2019   CREATININE 0.55 11/15/2019   BUN 43 (H) 11/15/2019   CO2 32 11/15/2019   TSH 2.153 06/12/2019   INR 1.3 (H) 10/11/2019  HGBA1C 5.8 (H) 10/11/2019   No changes today Is DNR  now  [name] MD  Beeper [phone] Office [phone] 11/16/2019 4:15 PM

## 2019-11-16 NOTE — Progress Notes (Signed)
Nutrition Brief Note  Chart reviewed. Pt now DNR with plans for 1-way extubation on 11/13 after family has chance to visit. No further labs/imaging. Started on morphine drip, supportive care only Noted TPN discontinued, plan to let current bag run out. NPO No further nutrition interventions warranted at this time.  Please re-consult as needed.    Kerman Passey MS, RDN, LDN, CNSC Registered Dietitian III Clinical Nutrition RD Pager and On-Call Pager Number Located in [place]

## 2019-11-17 DIAGNOSIS — J9601 Acute respiratory failure with hypoxia: Secondary | ICD-10-CM | POA: Diagnosis not present

## 2019-11-17 LAB — GLUCOSE, CAPILLARY
Glucose-Capillary: 78 mg/dL (ref 70–99)
Glucose-Capillary: 74 mg/dL (ref 70–99)
Glucose-Capillary: 82 mg/dL (ref 70–99)
Glucose-Capillary: 82 mg/dL (ref 70–99)
Glucose-Capillary: 101 mg/dL — ABNORMAL HIGH (ref 70–99)

## 2019-11-17 MED ORDER — SCOPOLAMINE 1 MG/3DAYS TD PT72
1.0000 | MEDICATED_PATCH | TRANSDERMAL | Status: DC | PRN
  Administered 2019-11-17: 1.5 mg via TRANSDERMAL
  Filled 2019-11-17 (×2): qty 1

## 2019-11-17 NOTE — Progress Notes (Signed)
Pt died at 2241 (asystole, confirmed no heart sounds by 2 RNs). Verified by primary RN Ovais Siddiqui and myself. [facility] and Dr. Kipp Brood notified.   Family was notified of pt's declining status and was en route to hospital when pt expired. Primary RN notified them that pt had passed once they arrived (within a half hour of TOD). Emotional support given by nursing team and chaplain.   Asystole CV strip saved and printed for pt's records.  Family in room now.

## 2019-11-17 NOTE — Progress Notes (Signed)
This RN spoke with [facility] nurse regarding the patient's low blood pressures. [place] nurse stated patient is a DNR and on a morphine drip for comfort. No new orders. Will continue to monitor.

## 2019-11-17 NOTE — Progress Notes (Signed)
NAME:  [name], MRN:  [id number], DOB:  [birth year], LOS: 94 ADMISSION DATE:  10/29/2019, CONSULTATION DATE: 10/20/2019 REFERRING MD:  Dr. Kipp Brood, CHIEF COMPLAINT:  SOB   Brief History   [name] is a 69 y/o female with a PMHx of esophageal squamous cell carcinoma admitted on 10/08 for esophagectomy with interval development of respiratory distress requiring BiPAP and transfer to ICU.   History of present illness   [name] presented to [facility] for admission for elective esophagectomy on 10/08 that proceeded without any complications. She is currently POD 6.   Overnight, patient developed worsening work of breathing requiring NRB. She was noted to be diaphoretic with decreased lung sounds by the RN. Rapid response was called, who switched chest tube from water seal to suction. Air leak noted in [place]. CXR obtained overnight showed small right basilar pneumothorax with loculated pleural effusion, as well as interval development of extensive airspace infiltrate in the LUL. Repeat CXR this morning shows improvement in pneumothorax with no change in opacities. In the late morning, patient developed worsening respiratory distress requiring BiPAP placement and transfer to ICU for close monitoring.  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 Fib  Significant Hospital Events   10/08 >> Admitted to [facility] 10/14 >> Transferred to ICU for respiratory distress  11/9 > PMT met with pt's significant other and daughter > DNR placed with plans for 1 way extubation on Saturday 11/13 after family has a chance to see and visit with pt  Consults:  PCCM  Procedures:  10/08 > Esophagectomy 10/15 > ETT placed, Bronchoscopy     Significant Diagnostic Tests:  CXR 10/13 > Small right basilar pneumothorax, however, has increased in size since prior examination and there has now developed a small amount of laterally loculated pleural fluid. Extensive  airspace infiltrate has now developed throughout the left upper lobe, likely infectious in the appropriate clinical setting.  CXR 10/14  > Right basilar pneumothorax is smaller compared to earlier in the day. No tension component. Subcutaneous air present on the right. There is extensive airspace opacity throughout the left upper lobe and left perihilar regions, similar to earlier in the day. There is patchy atelectasis in the right mid lung and right base with suspected loculated pleural effusion on the right laterally ABD xray 10/20 > Mild diffuse air distension of the bowel, either reflecting ileus or low-grade obstruction.  Micro Data:  10/18 Pleural culture - moderate GNR> MODERATE ENTEROBACTER CLOACAE  MODERATE ENTEROBACTER AEROGENES  FEW PSEUDOMONAS AERUGINOSA  10/15 Respiratory culture - Pan-senstive PA Wound culture 10/18 >  10/21 blood> NG 10/30 resp> pseudomonas and enterobacter aerogenes 11/2> resp > rare pseudomonas aeruginosa, rare enterobacter aerogenes  Antimicrobials:  Zosyn 10/14 >> 10/21 Cefepime 10/21 >10/28, 10/29>11/2 Flagyl 10/21 >10/24 Fluconazole 10/22> 10/27 Meropenem 11/2 >>  Interim history/subjective:  No acute events over night   Objective   Blood pressure (!) 98/39, pulse 79, temperature (!) 97.4 F (36.3 C), temperature source Axillary, resp. rate (!) 26, height 5' 2"  (1.575 m), weight 77.8 kg, SpO2 95 %.    Vent Mode: PRVC FiO2 (%):  [60 %] 60 % Set Rate:  [26 bmp] 26 bmp Vt Set:  [300 mL] 300 mL PEEP:  [8 cmH20] 8 cmH20 Plateau Pressure:  [18 cmH20-26 cmH20] 18 cmH20   Intake/Output Summary (Last 24 hours) at 11/20/2019 0859 Last data filed at 11/29/2019 0800 Gross per 24 hour  Intake 395.53  ml  Output 1535 ml  Net -1139.47 ml    Filed Weights   11/15/19 0532 11/16/19 0500 12/03/2019 0359  Weight: 76.5 kg 77.4 kg 77.8 kg   Physical Exam: General: Chronically ill appearing very frail elderly female on mechanical ventilation through trach,  in NAD HEENT: 8 cuffed shiley trach midline, MM pink/moist, PERRL,  Neuro: Minimally responsive on vent  CV: s1s2 regular rate and rhythm, no murmur, rubs, or gallops,  PULM:  Bilaterally rhonchi  GI: soft, bowel sounds active in all 4 quadrants, non-tender, non-distended Extremities: warm/dry, no edema  Skin: no rashes or lesions  Resolved Hospital Problem list   AKI Bradycardia  Assessment & Plan:   Active problem list: -Esophageal Squamous Cell Carcinoma s/p Esophagectomy c/b anastomotic leak -Acute encephalopathy likely multifactorial: septic shock, CNS depressing medications, likely ICU delirium -Acute hypoxemic and hypercarbic respiratory failure - S/P tracheostomy -Pseudomonas aeruginosa and enterobacter aerogenes PNA -Tracheal fistula  -Right pneumothorax -Septic shock- pneumonia, empyema -Inadequate PO intake -J- tube leak -Suspected enteral malabsorption   -Resolving ileus -Atrial fibrillation- now NSR  -Hx of Acute on chronic anemia -Asthma  -Non-obstructive CAD -LAD saccular aneurysm  -HTN  -Hx of Hypothyroidism  -Sacral wound  Plan:  Continue current comfort care  Continue morphine drip  1 way extubation planned 11/13 No further labs or imaging  Unrestricted family visitation    Johnsie Cancel, NP-C [facility] Contact / Pager information can be found on Amion  11/09/2019, 9:02 AM

## 2019-11-17 NOTE — Progress Notes (Signed)
[facility] Progress Note Patient Name: [name] DOB: November 19, 1950 MRN: [id number]   Date of Service  11/07/2019  HPI/Events of Note  Patient expired, family is in the room.  eICU Interventions  No intervention.        Kerry Kass Kamyla Olejnik 11/15/2019, 11:23 PM

## 2019-11-17 NOTE — Progress Notes (Signed)
EVENING ROUNDS NOTE :     [address]       [place] [address]             [phone]                 34 Days Post-Op Procedure(s) (LRB): XI ROBOTIC ASSISTED [place] ESOPHAGECTOMY USING NIMS (N/A) VIDEO BRONCHOSCOPY (N/A) JEJUNOSTOMY PLACEMENT (N/A) INTERCOSTAL NERVE BLOCK (Right)   Total Length of Stay:  LOS: 34 days  Events:   No events today    BP 94/80 (BP Location: Right Arm)   Pulse (!) 108   Temp 98.2 F (36.8 C) (Oral)   Resp (!) 24   Ht 5\' 2"  (1.575 m)   Wt 77.8 kg   SpO2 90%   BMI 31.37 kg/m      Vent Mode: PRVC FiO2 (%):  [60 %-70 %] 70 % Set Rate:  [26 bmp] 26 bmp Vt Set:  [300 mL] 300 mL PEEP:  [8 cmH20] 8 cmH20 Plateau Pressure:  [18 cmH20-26 cmH20] 21 cmH20  . sodium chloride 10 mL/hr at 11/07/19 2044  . morphine 3 mg/hr (11/07/2019 1600)    I/O last 3 completed shifts: In: 1779.2 [I.V.:1564.2; Other:125; NG/GT:90] Out: 2595 [Urine:2435; Stool:50; Chest Tube:110]   CBC Latest Ref Rng & Units 11/16/2019 11/14/2019 11/14/2019  WBC 4.0 - 10.5 K/uL 8.1 - 8.9  Hemoglobin 12.0 - 15.0 g/dL 7.0(L) 8.0(L) 6.9(LL)  Hematocrit 36 - 46 % 24.2(L) 27.7(L) 24.8(L)  Platelets 150 - 400 K/uL 194 - 223    BMP Latest Ref Rng & Units 11/15/2019 11/14/2019 11/13/2019  Glucose 70 - 99 mg/dL 164(H) 188(H) 262(H)  BUN 8 - 23 mg/dL 43(H) 44(H) 37(H)  Creatinine 0.44 - 1.00 mg/dL 0.55 0.65 0.58  BUN/Creat Ratio 12 - 28 - - -  Sodium 135 - 145 mmol/L 145 146(H) 143  Potassium 3.5 - 5.1 mmol/L 4.4 4.9 5.5(H)  Chloride 98 - 111 mmol/L 108 108 103  CO2 22 - 32 mmol/L 32 33(H) 33(H)  Calcium 8.9 - 10.3 mg/dL 8.0(L) 8.2(L) 8.0(L)    ABG    Component Value Date/Time   PHART 7.372 10/26/2019 0749   PCO2ART 54.4 (H) 10/26/2019 0749   PO2ART 105 10/26/2019 0749   HCO3 31.4 (H) 10/26/2019 0749   TCO2 33 (H) 10/26/2019 0749   ACIDBASEDEF 7.8 (H) 10/27/2019 2050   O2SAT 98.0 10/26/2019 0749       Melodie Bouillon, MD 11/16/2019 5:05 PM

## 2019-11-17 NOTE — Progress Notes (Signed)
[address]       [place] [address]             [phone]                 34 Days Post-Op Procedure(s) (LRB): XI ROBOTIC ASSISTED [place] ESOPHAGECTOMY USING NIMS (N/A) VIDEO BRONCHOSCOPY (N/A) JEJUNOSTOMY PLACEMENT (N/A) INTERCOSTAL NERVE BLOCK (Right)   Events: No events _______________________________________________________________ Vitals: BP (!) 98/39 (BP Location: Right Arm)    Pulse 79    Temp (!) 97.4 F (36.3 C) (Axillary)    Resp (!) 26    Ht 5\' 2"  (1.575 m)    Wt 77.8 kg    SpO2 95%    BMI 31.37 kg/m   - Neuro: sedated. arousable  - Cardiovascular: Sinus  Drips:     - Pulm:  Vent Mode: PRVC FiO2 (%):  [60 %] 60 % Set Rate:  [26 bmp] 26 bmp Vt Set:  [300 mL] 300 mL PEEP:  [8 cmH20] 8 cmH20 Plateau Pressure:  [18 cmH20-26 cmH20] 18 cmH20  ABG    Component Value Date/Time   PHART 7.372 10/26/2019 0749   PCO2ART 54.4 (H) 10/26/2019 0749   PO2ART 105 10/26/2019 0749   HCO3 31.4 (H) 10/26/2019 0749   TCO2 33 (H) 10/26/2019 0749   ACIDBASEDEF 7.8 (H) 10/24/2019 2050   O2SAT 98.0 10/26/2019 0749    - Abd: soft.  J tube in place  - Extremity: warm  .Intake/Output      11/10 0701 - 11/11 0700 11/11 0701 - 11/12 0700   I.V. (mL/kg) 482.6 (6.2) 3 (0)   Other 0    NG/GT     Total Intake(mL/kg) 482.6 (6.2) 3 (0)   Urine (mL/kg/hr) 1650 (0.9)    Drains 0    Stool 0    Chest Tube 60    Total Output 1710    Net -1227.4 +3           _______________________________________________________________ Labs: CBC Latest Ref Rng & Units 11/16/2019 11/14/2019 11/14/2019  WBC 4.0 - 10.5 K/uL 8.1 - 8.9  Hemoglobin 12.0 - 15.0 g/dL 7.0(L) 8.0(L) 6.9(LL)  Hematocrit 36 - 46 % 24.2(L) 27.7(L) 24.8(L)  Platelets 150 - 400 K/uL 194 - 223   CMP Latest Ref Rng & Units 11/15/2019 11/14/2019 11/13/2019  Glucose 70 - 99 mg/dL 164(H) 188(H) 262(H)  BUN 8 - 23 mg/dL 43(H) 44(H) 37(H)  Creatinine 0.44 - 1.00 mg/dL 0.55 0.65 0.58  Sodium 135 - 145  mmol/L 145 146(H) 143  Potassium 3.5 - 5.1 mmol/L 4.4 4.9 5.5(H)  Chloride 98 - 111 mmol/L 108 108 103  CO2 22 - 32 mmol/L 32 33(H) 33(H)  Calcium 8.9 - 10.3 mg/dL 8.0(L) 8.2(L) 8.0(L)  Total Protein 6.5 - 8.1 g/dL - 5.7(L) -  Total Bilirubin 0.3 - 1.2 mg/dL - 0.2(L) -  Alkaline Phos 38 - 126 U/L - 50 -  AST 15 - 41 U/L - 15 -  ALT 0 - 44 U/L - 15 -      _______________________________________________________________  Assessment and Plan: POD 34 s/p Robotic 3 field esophagectomy.  Respiratory failure, cervical anastamotic leak.  Tracheal mucosal ulceration with fistula  Neuro: pain controlled CV: sinus, continue amio gtt for now. Off pressors Pulm: continue vent support Renal: Creatinine stable GI: off tube feeds, and TPN Heme: receiving 1 unit of blood ID: wet to dry dressing to cervical incision.    Endo: SSI  Dispo: Pt is now  DNR.  No escalation of care.  Will transition to comfort care likely on Saturday.  Melodie Bouillon, MD

## 2019-12-07 NOTE — Death Summary Note (Signed)
DEATH SUMMARY   Patient Details  Name: [name] MRN: [id number] DOB: 11-Apr-1950  Admission/Discharge Information   Admit Date:  Oct 20, 2019  Date of Death: Date of Death: 11-23-19  Time of Death: Time of Death: Apr 03, 2239  Length of Stay: April 02, 2033  Referring Physician: [place], Sarah T, MD   Reason(s) for Hospitalization  Postoperative hospitalization following minimally invasive assisted esophagectomy.  Diagnoses  Preliminary cause of death:  Secondary Diagnoses (including complications and co-morbidities):  Active Problems:   Malignant neoplasm of esophagus (HCC)   H/O esophagectomy   Esophageal adenocarcinoma (HCC)   Pressure injury of skin   Acute hypoxemic respiratory failure (HCC)   Protein-calorie malnutrition, severe (HCC)   Encephalopathy acute   Septic shock (HCC)   Palliative care encounter   Acute and chr resp failure, unsp w hypoxia or hypercapnia ([place])   Ventilator dependence [facility])   Brief Hospital Course (including significant findings, care, treatment, and services provided and events leading to death)  [name] is a 69 y.o. year old female who diagnosed with locally advanced esophageal cancer and underwent neoadjuvant therapy.  She was evaluated in clinic several weeks prior to her surgery and there was concern that there was invasion of her trachea from the esophageal tumor.  Unfortunately she did not undergo a pretherapy endoscopic ultrasound to rule this out.  She required a PEG tube placed for nutrition during her neoadjuvant therapy and was able to maintain a decent weight throughout its course.  In clinic I explained the risks complications and possible death given the nature of this operation and the size, and location of her tumor, but the patient stated that she still wanted to undergo the operation.  She was originally brought to the operating theater on 10/20/2019 for bronchoscopy to assess whether or not there was tumor invasion.  On initial evaluation her  membranous portion of her trachea appeared clear, thus I elected to continue with her esophagectomy.  Her operation was uneventful, and she was extubated and transferred to a stepdown unit.  On her original swallow study there was no evidence of any anastomotic leak, and she was thus advanced to clear liquid diet.  On postoperative day 7 she developed increased work of breathing and required BiPAP support until she was ultimately intubated in the ICU.  She was started on broad-spectrum antibiotics and antifungals, and at this point was noted to have drainage coming from her cervical anastomosis.  This incision was opened and openly packed.  She continued to have left-sided pneumonitis which was concerning.  We are unable to wean her from the ventilator and we elected to perform a tracheostomy for better support.  During the tracheostomy procedure evaluation it was noted that she had a small hole in the membranous portion of her trachea which likely was the culprit for her left-sided pneumonia.  We were able to cover the whole with our trach, but she continued to require significant ventilatory support.  At this point she was quite debilitated and would not survive any further procedures, and after much debate the family elected to transition her to comfort care.  On the evening of 23-Nov-2022, the patient coded, and since she was DNR ACLS support was not initiated.    Pertinent Labs and Studies  Significant Diagnostic Studies DG Abd 1 View  Result Date: 11/07/2019 CLINICAL DATA:  OG tube placement EXAM: ABDOMEN - 1 VIEW COMPARISON:  11/07/2019 FINDINGS: NG tube tip is in stable position in the region of the  distal stomach. IMPRESSION: NG tube tip in the distal stomach, unchanged Electronically Signed   By: Rolm Baptise M.D.   On: 11/07/2019 21:17   DG Abd 1 View  Result Date: 10/26/2019 CLINICAL DATA:  Ileus EXAM: ABDOMEN - 1 VIEW COMPARISON:  10/15/2019 FINDINGS: Left lower quadrant jejunostomy  tube. Mild diffuse air distension of the bowel. No radiopaque calculi. IMPRESSION: Mild diffuse air distension of the bowel, either reflecting ileus or low-grade obstruction. Electronically Signed   By: Donavan Foil M.D.   On: 10/26/2019 19:16   CT CHEST WO CONTRAST  Result Date: 11/07/2019 CLINICAL DATA:  History of prior soft jet me with gastric pull-through and persistent drainage at the cervical incision site suggestive of leak, increasing left-sided infiltrate. EXAM: CT CHEST WITHOUT CONTRAST TECHNIQUE: Multidetector CT imaging of the chest was performed following the standard protocol without IV contrast. COMPARISON:  Chest x-ray from earlier in the same day, PET-CT from 09/27/2019, CT of the abdomen and pelvis from 10/30/2019 FINDINGS: Cardiovascular: Thoracic aorta demonstrates atherosclerotic calcifications. Evaluation is limited due to lack of IV contrast. Coronary calcifications are seen. No cardiac enlargement is noted. Left-sided PICC line and right-sided chest port are noted and stable. Mediastinum/Nodes: Thoracic inlet shows open neck wound consistent with the known prior esophagectomy and gastric pull-through. Packing material is noted within the wound. This approaches the superior aspect of the gastric pull-through adjacent to the anastomotic site although does not appear to communicate with the wound directly. Approximately 1 cm separation is noted from the superior aspect of the pull-through and the wound. Endotracheal tube is noted in satisfactory position. Gastric catheter extends into the stomach and towards the pylorus. No significant hilar or mediastinal adenopathy is noted. Lungs/Pleura: Right lung demonstrates lower lobe atelectatic changes. Large bore chest tube is seen in place. The previously pace pigtail catheter is noted laterally with small residual fluid collection identified. The large bore chest tube courses through this area as well prior to its course medially and  posteriorly to the right lung. A small loculated air collection is noted superiorly which does not appear to definitively communicate with the gastric pull-through. Left-sided pleural effusion is noted with lower lobe consolidation which has increased in the interval from the prior CT from 10/30/2019. The diffuse density throughout the left lung has improved somewhat in the interval from the prior CT examination and is likely related to underlying edema. Emphysematous changes are noted within the left lung as well. Upper Abdomen: Upper abdomen is within normal limits aside from the changes consistent with the gastric pull-through. Musculoskeletal: Degenerative changes of the thoracic spine are noted. No acute rib abnormality is noted. IMPRESSION: Changes consistent with the known history of esophagectomy with gastric pull-through. There is air which extends superior laterally from the gastric pull-through on the left which likely represents some redundant portion of the stomach but approaches the cervical wound superiorly and anteriorly. Given this proximity, the possibility of a small proximal leak could not be totally excluded. Repeat esophagram is recommended with water soluble contrast with the patient angled with the left side of the chest inferiorly. Persistent air-fluid collection in the apex of the right lung just below the tip of the large bore indwelling chest tube. Pigtail catheter is also noted within a small residual collection laterally. Right basilar atelectasis. Persistent but improved opacification in the left lung when compared with the prior exam from 10/30/2019. Likely related to underlying edema. Increasing consolidation in the left lower lobe when compared with the prior  CT from 10/30/2019. Associated slightly increased small left-sided pleural effusion is noted as well. Aortic Atherosclerosis (ICD10-I70.0) and Emphysema (ICD10-J43.9). Electronically Signed   By: Inez Catalina M.D.   On:  11/07/2019 16:44   CT ABDOMEN PELVIS W CONTRAST  Result Date: 10/30/2019 CLINICAL DATA:  69 year old female with abdominal pain and distension. History of esophageal carcinoma with esophagectomy and RIGHT pleural effusion/pleural catheter placement. History of prior small bowel surgery for bowel obstruction. EXAM: CT ABDOMEN AND PELVIS WITH CONTRAST TECHNIQUE: Multidetector CT imaging of the abdomen and pelvis was performed using the standard protocol following bolus administration of intravenous contrast. CONTRAST:  19mL OMNIPAQUE IOHEXOL 300 MG/ML  SOLN COMPARISON:  08/16/2019 CT prior studies FINDINGS: Lower chest: New diffuse airspace disease/consolidation/ground-glass opacities throughout the mid and LOWER LEFT lung noted with small LEFT pleural effusion which appears loculated. Scattered ground-glass opacities within the mid and LOWER RIGHT lung are present. Pleural catheters are noted within small loculated RIGHT pleural collections. Esophagectomy and gastric pull-through changes are present. Hepatobiliary: The liver and gallbladder are unremarkable. No biliary dilatation. Pancreas: Unremarkable Spleen: Unremarkable Adrenals/Urinary Tract: The kidneys, adrenal glands and bladder are unremarkable. Stomach/Bowel: Small bowel surgical anastomotic changes are noted within the LEFT abdomen. There are dilated loops of mid-distal small bowel loops without definite transition point. Contrast is noted within distal small bowel loops and colon. A LEFT LOWER quadrant percutaneous jejunostomy tube is present without adjacent fluid or abscess. There is edematous/circumferential wall thickening of a small bowel loop within the UPPER mid pelvis measuring at least 15 cm in length (series 3: Images 66-74) without evidence of pneumatosis. This is nonspecific and may represent focal enteritis but ischemic bowel could have this appearance. There is no evidence of pneumoperitoneum or abscess. A rectal tube/drain is noted.  Vascular/Lymphatic: Aortic atherosclerotic calcifications are identified. The atherosclerotic calcifications of the visualized mesenteric vessels noted which appear patent. No abnormal lymph nodes are identified. Reproductive: Status post hysterectomy. No adnexal masses. Other: A trace amount of ascites is noted adjacent to the liver and spleen. No pneumoperitoneum identified. Subcutaneous edema is present. Musculoskeletal: No acute or suspicious bony abnormalities are identified. IMPRESSION: 1. Edematous/circumferential wall thickening of a small bowel loop within the UPPER mid pelvis measuring at least 15 cm in length without evidence of pneumatosis. This is nonspecific and may represent a focal enteritis. Ischemic bowel could have this appearance but there is no evidence of pneumatosis and the visualized mesenteric vessels appear patent. No evidence of pneumoperitoneum or abscess. 2. Dilated loops of mid-distal small bowel loops without definite transition point. Contrast is noted within distal small bowel loops and colon and this may represent a postoperative ileus. 3. Diffuse airspace disease/consolidation/ground-glass opacities throughout the visualized mid and LOWER LEFT lung and scattered ground-glass opacities within the mid and LOWER RIGHT lung. 4. LEFT LOWER quadrant percutaneous jejunostomy tube without evidence of adjacent fluid or abscess. 5. Pleural catheters within small loculated RIGHT pleural collections. Small LEFT pleural effusion which appears loculated. 6. Trace amount of ascites. 7. Aortic Atherosclerosis (ICD10-I70.0). Electronically Signed   By: Margarette Canada M.D.   On: 10/30/2019 15:03   DG CHEST PORT 1 VIEW  Result Date: 11/12/2019 CLINICAL DATA:  Empyema. EXAM: PORTABLE CHEST 1 VIEW COMPARISON:  Radiograph and CT 11/08/2019 FINDINGS: Previous enteric tube is been removed, there is a new tracheostomy tube. A tracheostomy tube tip is at the thoracic inlet. The enteric tube has been  removed. Left upper extremity PICC tip unchanged in the SVC. Right chest port  unchanged. Pigtail catheter in the right hemithorax, unchanged in position. Right basilar chest tube also seen. Small amount of pleural fluid laterally in the right hemithorax which has slightly increased from prior exams. Slight increased opacity at the right lung base. Lucency at the right lung apex stable from prior CT. Volume loss in the left hemithorax with diffuse airspace opacity in left pleural effusion, not significantly changed. Stable heart size and mediastinal contours. IMPRESSION: 1. Tracheostomy tube tip at the thoracic inlet. 2. Slight increase in right pleural fluid laterally in the right hemithorax. Pigtail catheter and basilar chest tube remain in place. Slight increased opacity at the right lung base. 3. Diffuse left lung opacities and left pleural effusion are not significantly changed from prior. Electronically Signed   By: Keith Rake M.D.   On: 11/12/2019 16:15   DG CHEST PORT 1 VIEW  Result Date: 11/08/2019 CLINICAL DATA:  69 year old female with history of esophageal cancer status post esophagectomy and gastric pull-through subsequent chest tube drainage of loculated right pleural collection. Left lung edema versus pneumonia. Intubated. EXAM: PORTABLE CHEST 1 VIEW COMPARISON:  Chest CT 01/17/2019 and earlier. FINDINGS: Portable AP semi upright view at 0824 hours. Stable ET tube tip in good position between the level the clavicles and carina. Enteric tube courses to the abdomen, tip not included. Stable right side chest tube x2 (large caliber tube medially and pigtail tube laterally). Stable right chest Port-A-Cath and left PICC line. Stable lung volumes and ventilation from yesterday with combined widespread dense reticulonodular and retrocardiac consolidation in the left lung. No pneumothorax identified. Small left pleural effusion better demonstrated by CT yesterday. Stable mediastinal contours.  IMPRESSION: 1.  Stable lines and tubes. 2. No pneumothorax and stable ventilation. Widespread left lung opacity and small pleural effusion as seen by CT yesterday. Electronically Signed   By: Genevie Ann M.D.   On: 11/08/2019 08:36   DG Chest Port 1 View  Result Date: 11/07/2019 CLINICAL DATA:  Endotracheal tube. EXAM: PORTABLE CHEST 1 VIEW COMPARISON:  Yesterday FINDINGS: Endotracheal tube with tip just below the clavicular heads. Left PICC with tip at the SVC. Right porta catheter with tip at the SVC. Right-sided chest tubes. Confluent airspace disease on the left and mild opacity on the right with stable pleural opacity at the lateral right base. No definite pneumothorax. IMPRESSION: Stable hardware positioning and extensive left-sided airspace disease. Electronically Signed   By: Monte Fantasia M.D.   On: 11/07/2019 07:07   DG CHEST PORT 1 VIEW  Result Date: 11/06/2019 CLINICAL DATA:  Chest tube EXAM: PORTABLE CHEST 1 VIEW COMPARISON:  11/05/2019 FINDINGS: Endotracheal tube, right chest wall port, left PICC line, and two right chest tubes are again identified. Unchanged small right apical pneumothorax. Stable right pleural thickening. Diffuse left lung opacities again identified. Stable cardiomediastinal contours. IMPRESSION: 1. Stable lines and tubes. 2. Unchanged small right apical pneumothorax. Persistent diffuse left lung opacities. Electronically Signed   By: Macy Mis M.D.   On: 11/06/2019 08:21   DG Chest Port 1 View  Result Date: 11/05/2019 CLINICAL DATA:  Intubated patient. Respiratory distress. Follow-up exam. EXAM: PORTABLE CHEST 1 VIEW COMPARISON:  11/04/2019 and earlier exams. FINDINGS: Small right apical pneumothorax is stable. Interstitial and hazy airspace opacities throughout the left lung are unchanged. Interstitial thickening on the right, most evident at the base, is also unchanged. No new lung abnormalities. Larger caliber right chest tube, tip near the right apex, stable.  Stable smaller caliber chest tube along the right  lateral mid hemithorax. Endotracheal tube, nasogastric tube, right-sided Port-A-Cath and left-sided PICC are stable in well positioned. IMPRESSION: 1. No change from the previous day's study. 2. Small right apical pneumothorax. 3. Diffuse left lung interstitial and airspace opacities consistent with pneumonia. 4. Support apparatus is stable and well positioned. Electronically Signed   By: Lajean Manes M.D.   On: 11/05/2019 08:39   DG CHEST PORT 1 VIEW  Result Date: 11/04/2019 CLINICAL DATA:  Intubation.  Chest tubes. EXAM: PORTABLE CHEST 1 VIEW COMPARISON:  11/03/2019 FINDINGS: Endotracheal tube, NG tube, Port-A-Cath, right chest tubes in stable position. Stable small right upper medial pneumothorax. Stable right-sided pleural thickening. Diffuse left lung infiltrate again noted. Heart size stable. IMPRESSION: 1. Lines and tubes in including right chest tubes in stable position. Stable small right upper medial pneumothorax. Stable right-sided pleural thickening. 2. Diffuse left lung infiltrate again noted without interim change. Electronically Signed   By: Marcello Moores  Register   On: 11/04/2019 06:47   DG Chest Port 1 View  Result Date: 11/03/2019 CLINICAL DATA:  Intubation, chest tubes EXAM: PORTABLE CHEST 1 VIEW COMPARISON:  Portable exam 0531 hours compared to 10/30/2019 FINDINGS: Tip of endotracheal tube projects 3.1 cm above carina. Nasogastric tube extends into stomach. RIGHT jugular Port-A-Cath with tip projecting over SVC. LEFT arm PICC line, tip projecting over SVC. RIGHT thoracostomy tubes again seen with loculated pneumothorax at the medial upper RIGHT lung. Atherosclerotic calcification aorta. Normal heart size mediastinal contours. Extensive infiltrates throughout LEFT lung and mildly at RIGHT base. Small RIGHT pleural effusion. No acute osseous findings. IMPRESSION: Asymmetric pulmonary infiltrates much greater on LEFT, less at RIGHT lung base.  Loculated pneumothorax at medial upper RIGHT hemithorax despite thoracostomy tubes. Aortic Atherosclerosis (ICD10-I70.0). Electronically Signed   By: Lavonia Dana M.D.   On: 11/03/2019 07:53   DG CHEST PORT 1 VIEW  Result Date: 10/30/2019 CLINICAL DATA:  History of esophagectomy EXAM: PORTABLE CHEST 1 VIEW COMPARISON:  Six days ago FINDINGS: Endotracheal tube with tip 4 mm above the carina. Right port and left PICC in stable position. Pigtail pleural catheter on the right in addition to the large bore chest tube, with reduction and right-sided pleural effusion. Asymmetric interstitial and airspace opacity on the left. No visible pneumothorax. Normal heart size. IMPRESSION: 1. Lower endotracheal tube, tip 4 mm above the carina. 2. Decreased right loculated pleural effusion after pigtail placement. 3. Unchanged asymmetric left-sided airspace disease, under treatment for pneumonia. Electronically Signed   By: Monte Fantasia M.D.   On: 10/30/2019 07:15   DG Abd Portable 1V  Result Date: 11/07/2019 CLINICAL DATA:  Abdominal swelling. EXAM: PORTABLE ABDOMEN - 1 VIEW COMPARISON:  None. FINDINGS: The bowel gas pattern is normal. Distal tip of nasogastric tube is seen in distal stomach. No radio-opaque calculi or other significant radiographic abnormality are seen. IMPRESSION: No evidence of bowel obstruction or ileus. Distal tip of nasogastric tube seen in distal stomach. Electronically Signed   By: Marijo Conception M.D.   On: 11/07/2019 15:17   DG Abd Portable 1V  Result Date: 10/28/2019 CLINICAL DATA:  Jejunostomy tube leak EXAM: PORTABLE ABDOMEN - 1 VIEW COMPARISON:  10/26/2019 FINDINGS: Feeding tube in the left lower quadrant. Contrast present within left lower quadrant small bowel loops. No gross extravasation. No significant change in mild gaseous dilatation of bowel. Pigtail catheter over the right lower lateral chest with adjacent right pleural effusion or thickening. IMPRESSION: Left lower quadrant  feeding tube with contrast opacification of small bowel loops. No  gross extravasation on limited static view. Electronically Signed   By: Donavan Foil M.D.   On: 10/28/2019 15:29    Microbiology No results found for this or any previous visit (from the past 240 hour(s)).  Lab Basic Metabolic Panel: No results for input(s): NA, K, CL, CO2, GLUCOSE, BUN, CREATININE, CALCIUM, MG, PHOS in the last 168 hours. Liver Function Tests: No results for input(s): AST, ALT, ALKPHOS, BILITOT, PROT, ALBUMIN in the last 168 hours. No results for input(s): LIPASE, AMYLASE in the last 168 hours. No results for input(s): AMMONIA in the last 168 hours. CBC: No results for input(s): WBC, NEUTROABS, HGB, HCT, MCV, PLT in the last 168 hours. Cardiac Enzymes: No results for input(s): CKTOTAL, CKMB, CKMBINDEX, TROPONINI in the last 168 hours. Sepsis Labs: No results for input(s): PROCALCITON, WBC, LATICACIDVEN in the last 168 hours.  Procedures/Operations  On 10/20/2019, bronchoscopy, and minimally invasive 3 field esophagectomy was performed.  An open jejunostomy tube was performed due to multiple bowel adhesions and history of small bowel resection.  On November 08, 2019 bronchoscopy, and percutaneous tracheostomy was performed.    Lajuana Matte 11/25/2019, 8:20 AM

## 2019-12-07 DEATH — deceased

## 2021-10-01 IMAGING — DX DG CHEST 1V
1 series · 1 of 1 positions shown · non-contrast
Comparison: One day prior

CLINICAL DATA: Follow-up of right-sided pleural effusion. Status
post esophagectomy.

EXAM:
CHEST  1 VIEW

[chest ap]
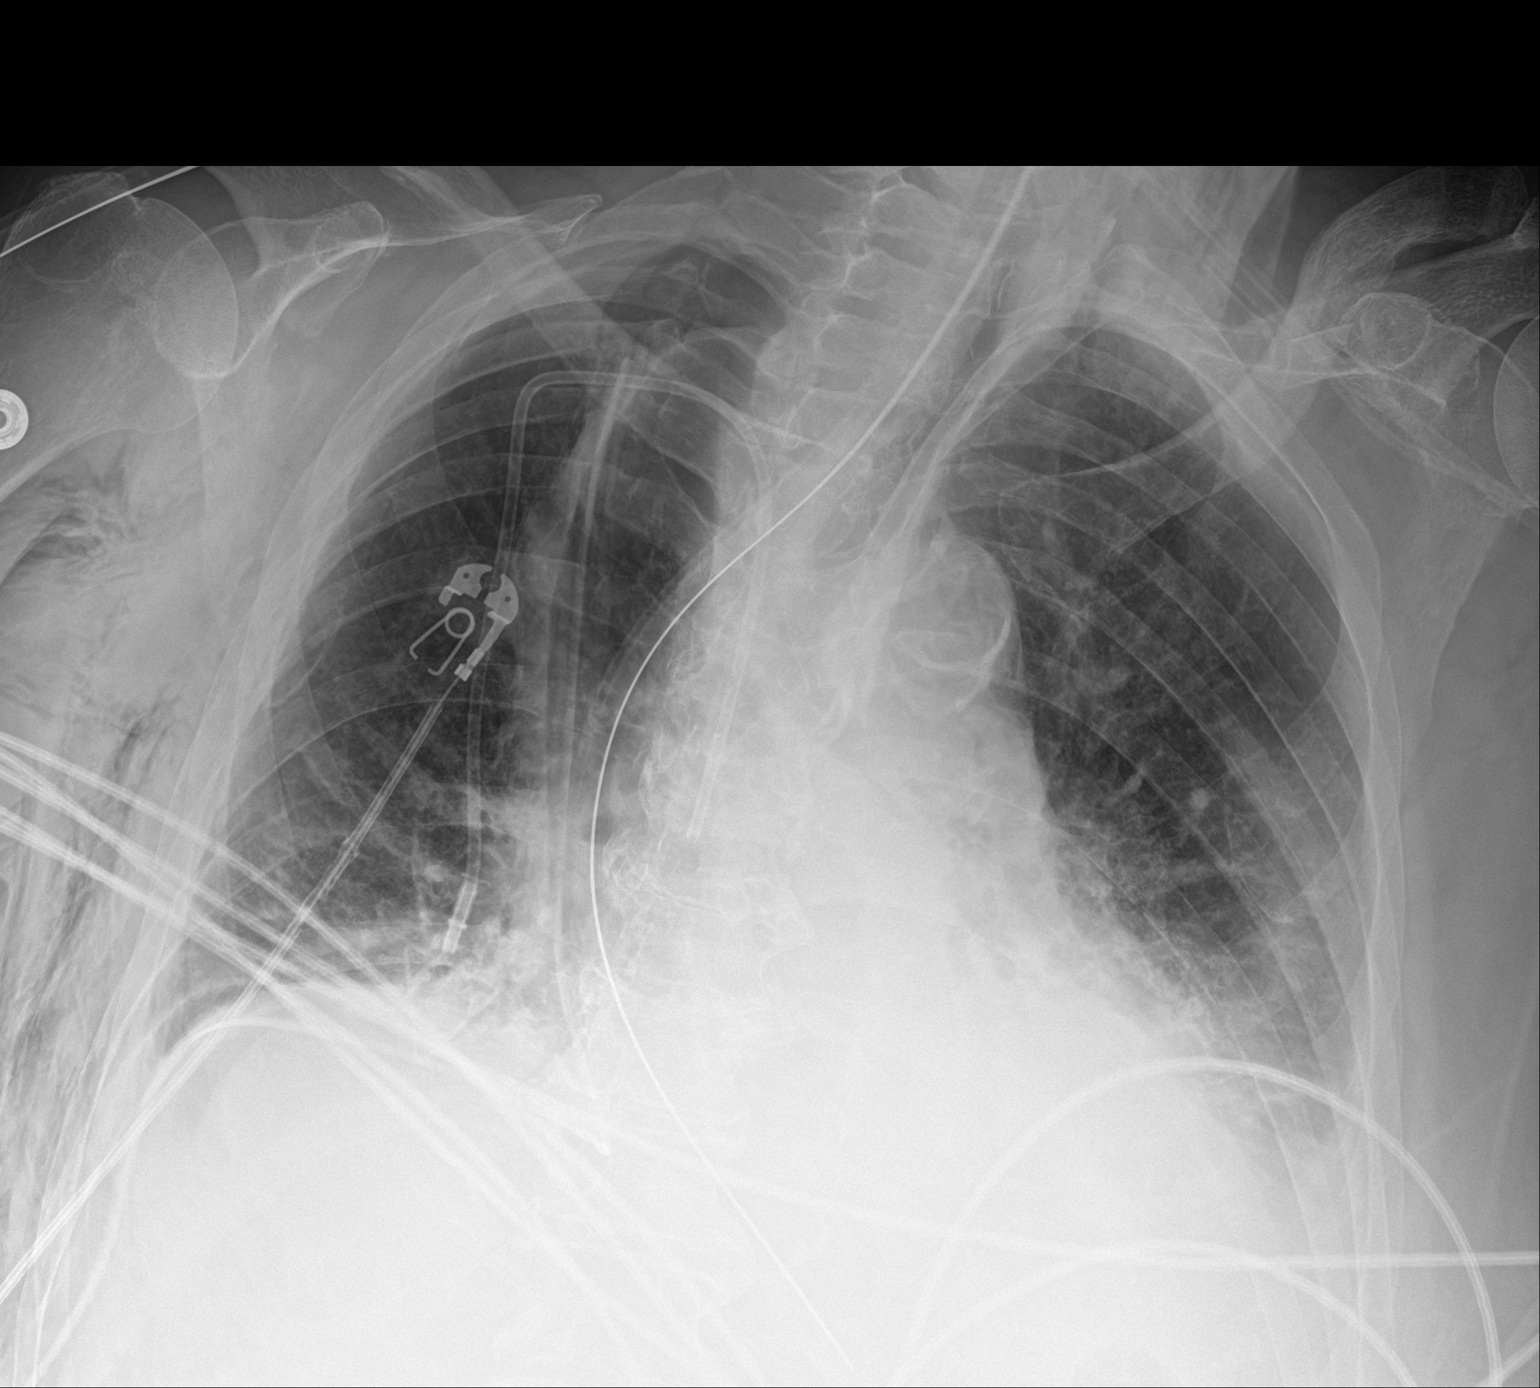

[1 of 1 positions shown; findings below may reference images not displayed]

FINDINGS: Patient moderately rotated left. Radiopaque density projecting over
the left side of the thoracic inlet, as before. Nasogastric tube
extends beyond the inferior aspect of the film. Right internal
jugular line tip at low SVC.

Right-sided chest tube is similar in position, terminating over the
upper chest. The right-sided apical pneumothorax has resolved.
Hemidiaphragm is well visualized and sub pulmonic air is suspected.
Decrease in small volume right-sided subcutaneous emphysema.

Mild cardiomegaly. Atherosclerosis in the transverse aorta. Small
left pleural effusion is new. Increased bibasilar airspace disease.
IMPRESSION: Right-sided chest tube remaining in place with decrease in
right-sided pneumothorax. Suspect residual small volume sub pulmonic
air.

Worsened aeration with developing left pleural fluid and bibasilar
airspace disease. Most likely atelectasis. At the left lung base,
infection or aspiration is a consideration.

Probable surgical drain projecting over the upper left chest, as
before.

## 2021-10-09 IMAGING — CT CT PERC PLEURAL DRAIN W/INDWELL CATH W/IMG GUIDE
1 of 3 series · 15 of 32 positions shown, 19 images · non-contrast
Comparison: none

INDICATION: 69-year-old female referred for pigtail catheter placement into a
loculated right pleural fluid collection

[Series 2: i-spiral 5.0 b40f · axial · 0.71mm/px · z∈[+1212,+1464]mm · 15 of 80 slices shown, 19 images]
[im 4/80  soft-tissue]
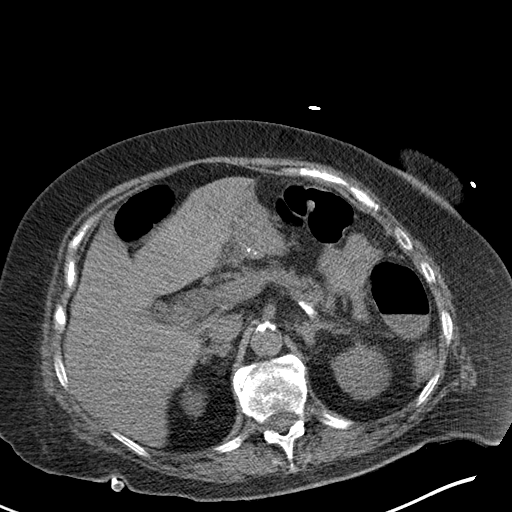
[im 4/80  bone]
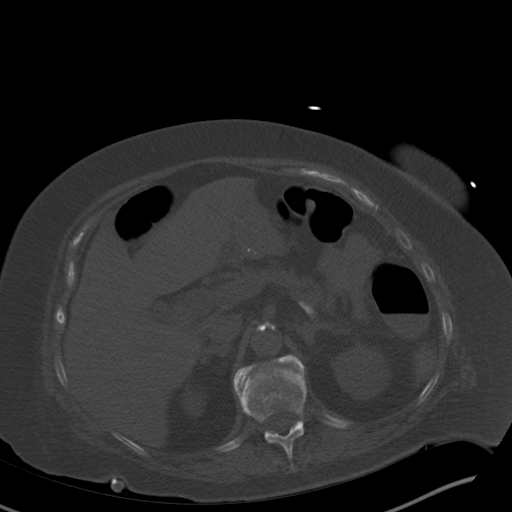
[im 11/80  soft-tissue]
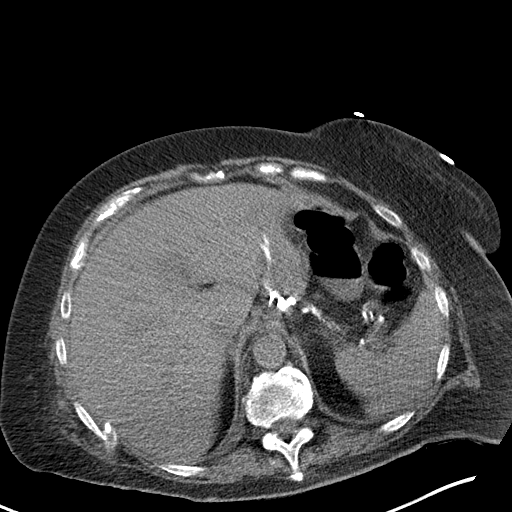
[im 18/80  soft-tissue]
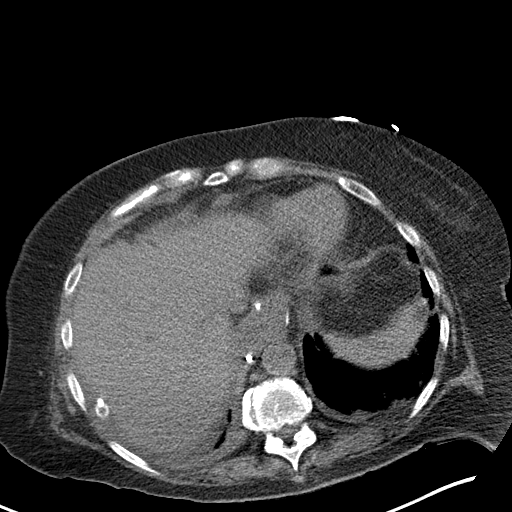
[im 22/80  soft-tissue]
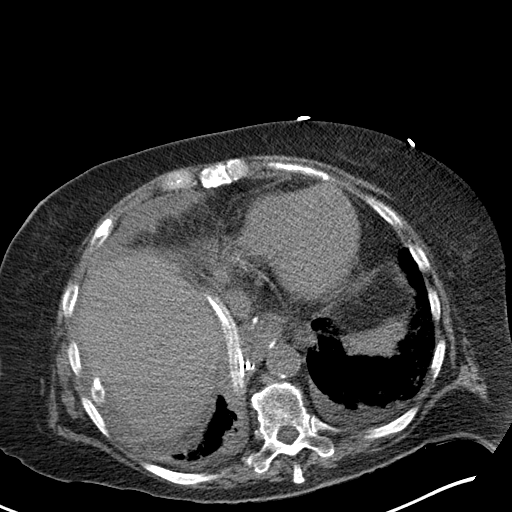
[im 29/80  soft-tissue]
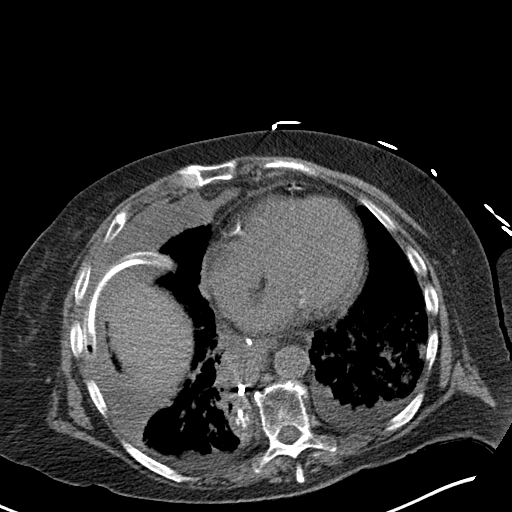
[im 33/80  soft-tissue]
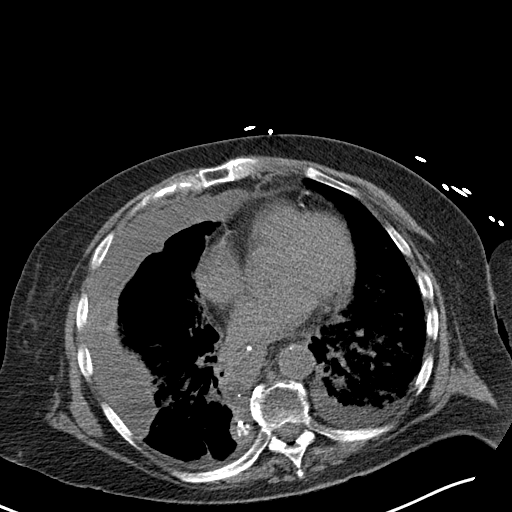
[im 40/80  soft-tissue]
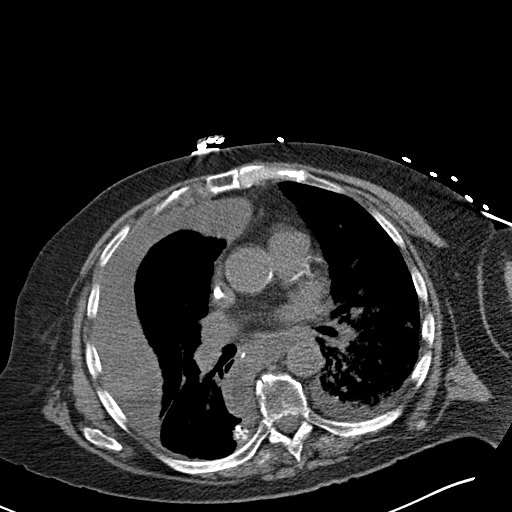
[im 47/80  soft-tissue]
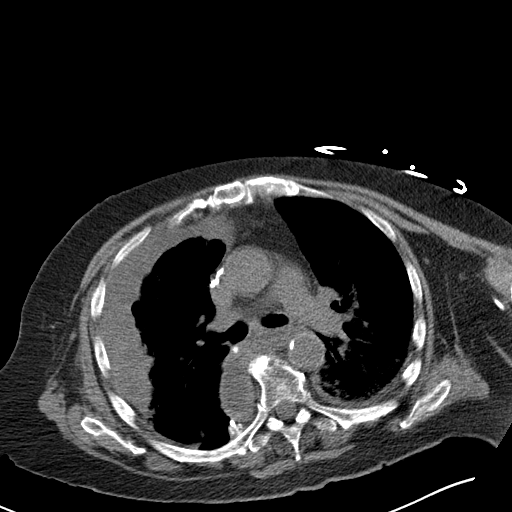
[im 51/80  soft-tissue]
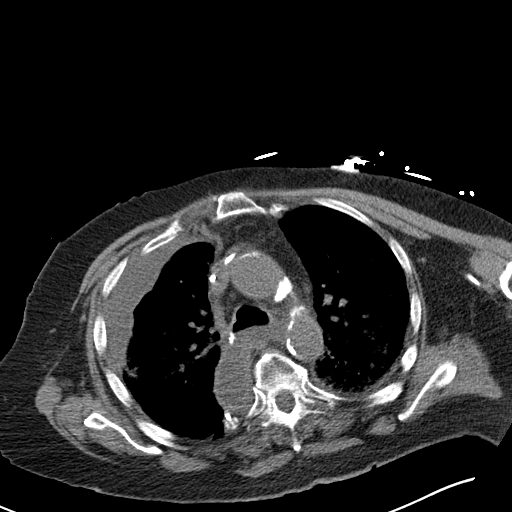
[im 51/80  bone]
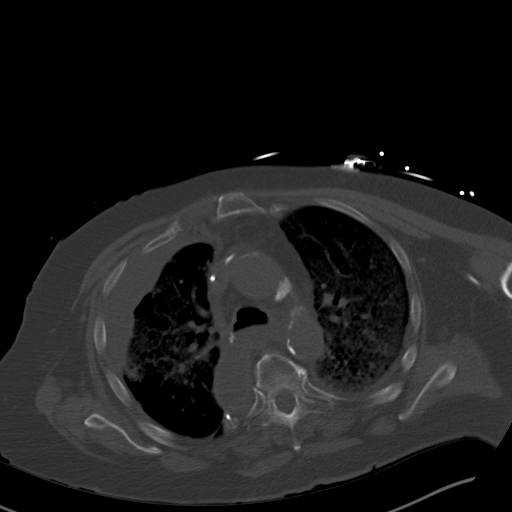
[im 58/80  soft-tissue]
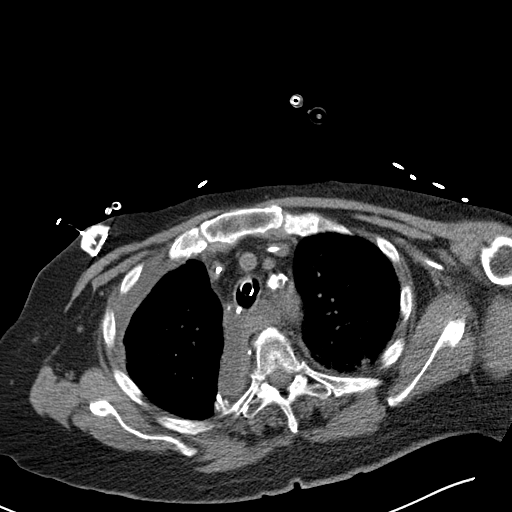
[im 62/80  soft-tissue]
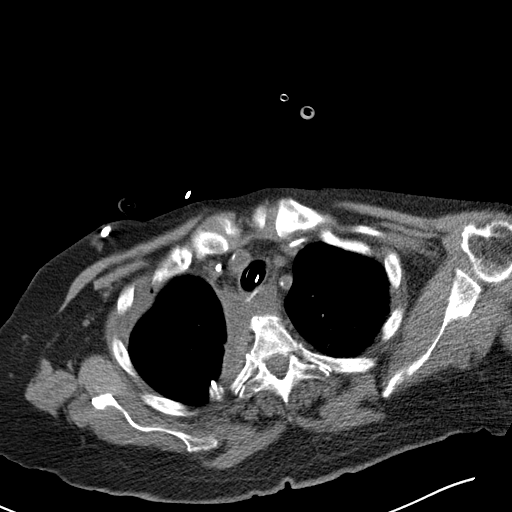
[im 65/80  lung]
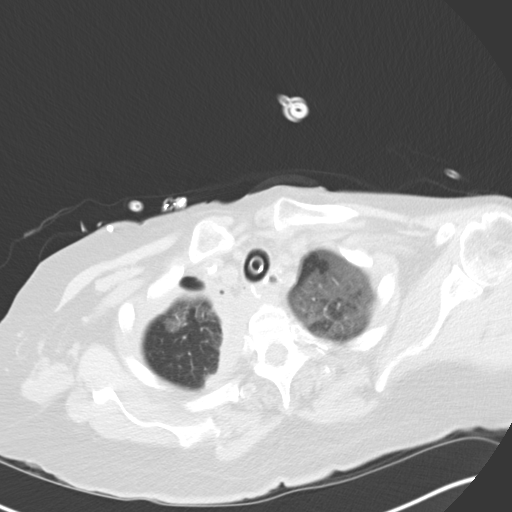
[im 69/80  soft-tissue]
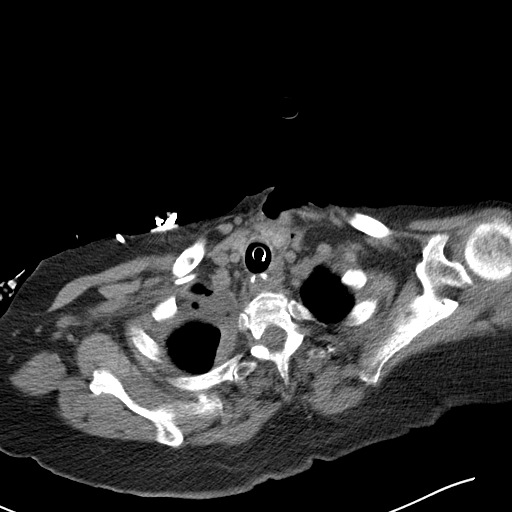
[im 69/80  lung]
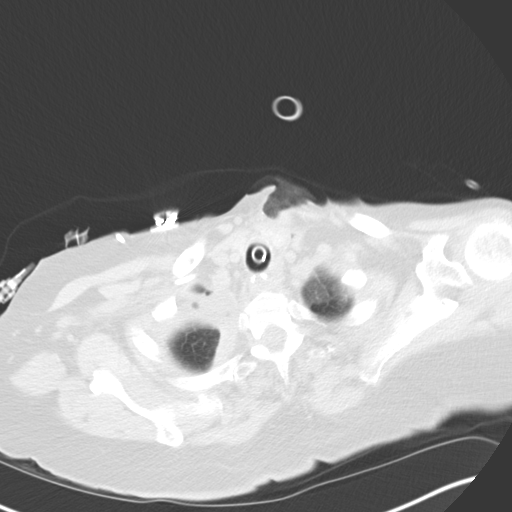
[im 72/80  lung]
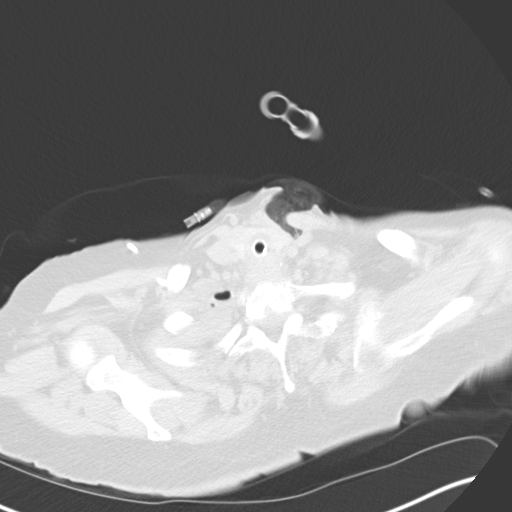
[im 76/80  soft-tissue]
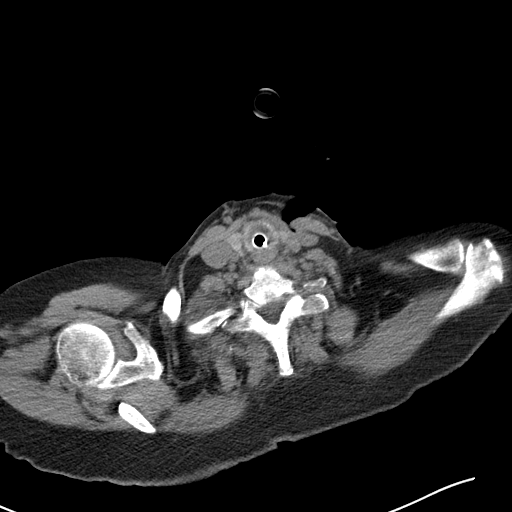
[im 76/80  lung]
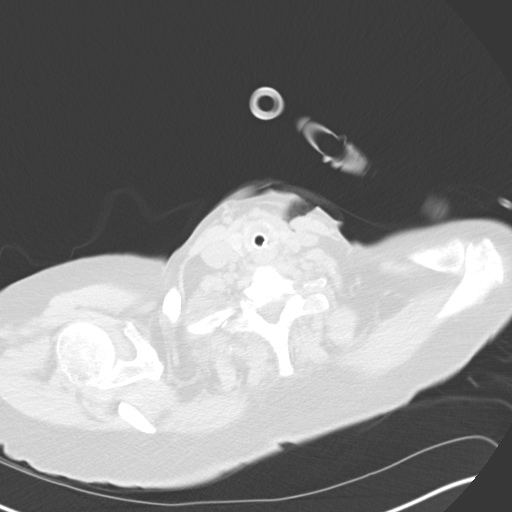

[15 of 32 positions shown; findings below may reference images not displayed]

EXAM:
IMAGE GUIDED PLACEMENT OF PIGTAIL DRAINAGE CATHETER INTO THE RIGHT
PLEURAL SPACE

MEDICATIONS:
The patient is currently admitted to the hospital and receiving
intravenous antibiotics. The antibiotics were administered within an
appropriate time frame prior to the initiation of the procedure.

ANESTHESIA/SEDATION:
No sedation

COMPLICATIONS:
None

PROCEDURE:
Informed written consent was obtained from the patient after a
thorough discussion of the procedural risks, benefits and
alternatives. All questions were addressed. Maximal Sterile Barrier
Technique was utilized including caps, mask, sterile gowns, sterile
gloves, sterile drape, hand hygiene and skin antiseptic. A timeout
was performed prior to the initiation of the procedure.

Patient was positioned supine position on CT gantry table. Scout CT
was acquired for planning purposes.

The anterior right chest was prepped and draped in the usual sterile
fashion. 1% lidocaine was used for local anesthesia. Using CT
guidance, a Yueh needle was advanced into the right pleural space
from a right anterior approach, targeting loculated fluid in the
lateral right chest. Using modified Seldinger technique, 10 French
pigtail drainage catheter was placed into the pleural space.
Catheter was sutured in position and attached to a pleura vac
system. Sample of turbid yellow fluid was sent for culture. We
confirmed operational chest tube with wall suction on the pleura vac
and secured the drain in place.

Patient tolerated the procedure well and remained hemodynamically
stable throughout.

No complications were encountered and no significant blood loss.
IMPRESSION: Status post CT-guided placement of pigtail drainage catheter into
the right loculated pleural fluid.
